# Patient Record
Sex: Male | Born: 1980 | Race: White | Hispanic: No | Marital: Married | Smoking: Current every day smoker
Health system: Southern US, Community
[De-identification: ages and names within clinical notes are randomized; demographics above are authoritative.]

## PROBLEM LIST (undated history)

## (undated) DIAGNOSIS — F329 Major depressive disorder, single episode, unspecified: Secondary | ICD-10-CM

## (undated) DIAGNOSIS — F419 Anxiety disorder, unspecified: Secondary | ICD-10-CM

## (undated) DIAGNOSIS — R51 Headache: Secondary | ICD-10-CM

## (undated) DIAGNOSIS — F4310 Post-traumatic stress disorder, unspecified: Secondary | ICD-10-CM

## (undated) DIAGNOSIS — T7840XA Allergy, unspecified, initial encounter: Secondary | ICD-10-CM

## (undated) DIAGNOSIS — G7101 Duchenne or Becker muscular dystrophy: Secondary | ICD-10-CM

## (undated) DIAGNOSIS — G709 Myoneural disorder, unspecified: Secondary | ICD-10-CM

## (undated) DIAGNOSIS — E785 Hyperlipidemia, unspecified: Secondary | ICD-10-CM

## (undated) DIAGNOSIS — K219 Gastro-esophageal reflux disease without esophagitis: Secondary | ICD-10-CM

## (undated) DIAGNOSIS — E119 Type 2 diabetes mellitus without complications: Secondary | ICD-10-CM

## (undated) DIAGNOSIS — F32A Depression, unspecified: Secondary | ICD-10-CM

## (undated) DIAGNOSIS — G4730 Sleep apnea, unspecified: Secondary | ICD-10-CM

## (undated) DIAGNOSIS — F319 Bipolar disorder, unspecified: Secondary | ICD-10-CM

## (undated) DIAGNOSIS — E669 Obesity, unspecified: Secondary | ICD-10-CM

## (undated) HISTORY — DX: Depression, unspecified: F32.A

## (undated) HISTORY — DX: Bipolar disorder, unspecified: F31.9

## (undated) HISTORY — DX: Headache: R51

## (undated) HISTORY — DX: Major depressive disorder, single episode, unspecified: F32.9

## (undated) HISTORY — DX: Duchenne or Becker muscular dystrophy: G71.01

## (undated) HISTORY — DX: Myoneural disorder, unspecified: G70.9

## (undated) HISTORY — DX: Sleep apnea, unspecified: G47.30

## (undated) HISTORY — DX: Anxiety disorder, unspecified: F41.9

## (undated) HISTORY — DX: Type 2 diabetes mellitus without complications: E11.9

## (undated) HISTORY — DX: Obesity, unspecified: E66.9

## (undated) HISTORY — PX: TONSILLECTOMY: SUR1361

## (undated) HISTORY — DX: Allergy, unspecified, initial encounter: T78.40XA

## (undated) HISTORY — DX: Hyperlipidemia, unspecified: E78.5

---
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
## 2000-01-07 ENCOUNTER — Encounter: Admission: RE | Admit: 2000-01-07 | Discharge: 2000-01-07 | Payer: Self-pay | Admitting: Family Medicine

## 2000-06-03 ENCOUNTER — Encounter: Admission: RE | Admit: 2000-06-03 | Discharge: 2000-06-03 | Payer: Self-pay | Admitting: Family Medicine

## 2000-11-23 ENCOUNTER — Encounter: Admission: RE | Admit: 2000-11-23 | Discharge: 2000-11-23 | Payer: Self-pay | Admitting: Family Medicine

## 2000-12-07 ENCOUNTER — Emergency Department (HOSPITAL_COMMUNITY): Admission: EM | Admit: 2000-12-07 | Discharge: 2000-12-07 | Payer: Self-pay | Admitting: *Deleted

## 2000-12-08 ENCOUNTER — Emergency Department (HOSPITAL_COMMUNITY): Admission: EM | Admit: 2000-12-08 | Discharge: 2000-12-08 | Payer: Self-pay | Admitting: Emergency Medicine

## 2000-12-09 ENCOUNTER — Encounter: Admission: RE | Admit: 2000-12-09 | Discharge: 2000-12-09 | Payer: Self-pay | Admitting: Family Medicine

## 2001-03-26 ENCOUNTER — Encounter: Admission: RE | Admit: 2001-03-26 | Discharge: 2001-03-26 | Payer: Self-pay | Admitting: Family Medicine

## 2001-05-11 ENCOUNTER — Encounter: Admission: RE | Admit: 2001-05-11 | Discharge: 2001-05-11 | Payer: Self-pay | Admitting: Sports Medicine

## 2001-07-28 ENCOUNTER — Encounter: Admission: RE | Admit: 2001-07-28 | Discharge: 2001-07-28 | Payer: Self-pay | Admitting: Family Medicine

## 2001-08-09 ENCOUNTER — Encounter: Admission: RE | Admit: 2001-08-09 | Discharge: 2001-08-09 | Payer: Self-pay | Admitting: Sports Medicine

## 2001-08-09 ENCOUNTER — Encounter: Payer: Self-pay | Admitting: Sports Medicine

## 2001-08-11 ENCOUNTER — Encounter: Admission: RE | Admit: 2001-08-11 | Discharge: 2001-08-11 | Payer: Self-pay | Admitting: Family Medicine

## 2002-04-12 ENCOUNTER — Encounter: Admission: RE | Admit: 2002-04-12 | Discharge: 2002-04-12 | Payer: Self-pay | Admitting: Family Medicine

## 2002-04-15 ENCOUNTER — Encounter: Payer: Self-pay | Admitting: Sports Medicine

## 2002-04-15 ENCOUNTER — Encounter: Admission: RE | Admit: 2002-04-15 | Discharge: 2002-04-15 | Payer: Self-pay | Admitting: Sports Medicine

## 2002-05-03 ENCOUNTER — Encounter: Admission: RE | Admit: 2002-05-03 | Discharge: 2002-05-03 | Payer: Self-pay | Admitting: Family Medicine

## 2002-06-01 ENCOUNTER — Encounter: Admission: RE | Admit: 2002-06-01 | Discharge: 2002-06-01 | Payer: Self-pay | Admitting: Family Medicine

## 2002-08-01 ENCOUNTER — Encounter: Admission: RE | Admit: 2002-08-01 | Discharge: 2002-08-01 | Payer: Self-pay | Admitting: Family Medicine

## 2002-10-17 ENCOUNTER — Encounter: Payer: Self-pay | Admitting: Emergency Medicine

## 2002-10-17 ENCOUNTER — Emergency Department (HOSPITAL_COMMUNITY): Admission: EM | Admit: 2002-10-17 | Discharge: 2002-10-17 | Payer: Self-pay | Admitting: Emergency Medicine

## 2003-01-20 ENCOUNTER — Encounter: Admission: RE | Admit: 2003-01-20 | Discharge: 2003-01-20 | Payer: Self-pay | Admitting: Family Medicine

## 2003-04-13 ENCOUNTER — Encounter: Admission: RE | Admit: 2003-04-13 | Discharge: 2003-04-13 | Payer: Self-pay | Admitting: Family Medicine

## 2003-04-18 ENCOUNTER — Encounter: Admission: RE | Admit: 2003-04-18 | Discharge: 2003-04-18 | Payer: Self-pay | Admitting: Family Medicine

## 2003-08-16 ENCOUNTER — Encounter: Admission: RE | Admit: 2003-08-16 | Discharge: 2003-08-16 | Payer: Self-pay | Admitting: Family Medicine

## 2003-08-18 ENCOUNTER — Encounter: Admission: RE | Admit: 2003-08-18 | Discharge: 2003-08-18 | Payer: Self-pay | Admitting: Family Medicine

## 2003-08-21 ENCOUNTER — Encounter: Admission: RE | Admit: 2003-08-21 | Discharge: 2003-08-21 | Payer: Self-pay | Admitting: Sports Medicine

## 2003-09-04 ENCOUNTER — Encounter: Admission: RE | Admit: 2003-09-04 | Discharge: 2003-10-05 | Payer: Self-pay | Admitting: Sports Medicine

## 2004-02-02 ENCOUNTER — Encounter: Admission: RE | Admit: 2004-02-02 | Discharge: 2004-02-02 | Payer: Self-pay | Admitting: Sports Medicine

## 2004-04-05 ENCOUNTER — Encounter: Admission: RE | Admit: 2004-04-05 | Discharge: 2004-04-05 | Payer: Self-pay | Admitting: Family Medicine

## 2004-06-07 ENCOUNTER — Encounter: Admission: RE | Admit: 2004-06-07 | Discharge: 2004-06-07 | Payer: Self-pay | Admitting: Family Medicine

## 2004-07-05 HISTORY — PX: TRANSTHORACIC ECHOCARDIOGRAM: SHX275

## 2004-07-08 ENCOUNTER — Ambulatory Visit: Payer: Self-pay | Admitting: Family Medicine

## 2004-08-09 ENCOUNTER — Ambulatory Visit: Payer: Self-pay | Admitting: Sports Medicine

## 2004-08-28 ENCOUNTER — Ambulatory Visit: Payer: Self-pay | Admitting: Family Medicine

## 2005-03-06 ENCOUNTER — Ambulatory Visit: Payer: Self-pay | Admitting: Family Medicine

## 2005-03-07 ENCOUNTER — Ambulatory Visit: Payer: Self-pay | Admitting: Family Medicine

## 2005-03-12 ENCOUNTER — Ambulatory Visit: Payer: Self-pay | Admitting: Family Medicine

## 2005-04-02 ENCOUNTER — Ambulatory Visit (HOSPITAL_COMMUNITY): Admission: RE | Admit: 2005-04-02 | Discharge: 2005-04-02 | Payer: Self-pay | Admitting: Sports Medicine

## 2005-04-06 ENCOUNTER — Emergency Department (HOSPITAL_COMMUNITY): Admission: EM | Admit: 2005-04-06 | Discharge: 2005-04-06 | Payer: Self-pay | Admitting: Emergency Medicine

## 2005-04-11 ENCOUNTER — Ambulatory Visit: Payer: Self-pay | Admitting: Sports Medicine

## 2005-04-28 ENCOUNTER — Ambulatory Visit: Payer: Self-pay | Admitting: Sports Medicine

## 2005-05-20 ENCOUNTER — Ambulatory Visit: Payer: Self-pay | Admitting: Family Medicine

## 2005-08-08 ENCOUNTER — Ambulatory Visit: Payer: Self-pay | Admitting: Family Medicine

## 2005-08-13 ENCOUNTER — Ambulatory Visit: Payer: Self-pay | Admitting: Family Medicine

## 2005-10-15 ENCOUNTER — Ambulatory Visit: Payer: Self-pay | Admitting: Family Medicine

## 2005-10-22 ENCOUNTER — Ambulatory Visit: Payer: Self-pay | Admitting: Family Medicine

## 2005-12-19 ENCOUNTER — Emergency Department (HOSPITAL_COMMUNITY): Admission: EM | Admit: 2005-12-19 | Discharge: 2005-12-20 | Payer: Self-pay | Admitting: Emergency Medicine

## 2005-12-23 ENCOUNTER — Ambulatory Visit: Payer: Self-pay | Admitting: Family Medicine

## 2006-01-08 ENCOUNTER — Emergency Department (HOSPITAL_COMMUNITY): Admission: EM | Admit: 2006-01-08 | Discharge: 2006-01-09 | Payer: Self-pay | Admitting: Emergency Medicine

## 2006-04-24 ENCOUNTER — Ambulatory Visit: Payer: Self-pay | Admitting: Family Medicine

## 2006-07-21 ENCOUNTER — Ambulatory Visit: Payer: Self-pay | Admitting: Sports Medicine

## 2006-08-11 ENCOUNTER — Ambulatory Visit: Payer: Self-pay | Admitting: Family Medicine

## 2006-12-25 ENCOUNTER — Telehealth: Payer: Self-pay | Admitting: *Deleted

## 2006-12-25 ENCOUNTER — Ambulatory Visit: Payer: Self-pay | Admitting: Sports Medicine

## 2006-12-25 DIAGNOSIS — F17200 Nicotine dependence, unspecified, uncomplicated: Secondary | ICD-10-CM

## 2007-02-25 ENCOUNTER — Encounter (INDEPENDENT_AMBULATORY_CARE_PROVIDER_SITE_OTHER): Payer: Self-pay | Admitting: Family Medicine

## 2007-02-26 ENCOUNTER — Ambulatory Visit: Payer: Self-pay | Admitting: Sports Medicine

## 2007-04-26 ENCOUNTER — Ambulatory Visit: Payer: Self-pay | Admitting: Family Medicine

## 2007-04-26 ENCOUNTER — Emergency Department (HOSPITAL_COMMUNITY): Admission: EM | Admit: 2007-04-26 | Discharge: 2007-04-26 | Payer: Self-pay | Admitting: Family Medicine

## 2007-04-26 ENCOUNTER — Telehealth (INDEPENDENT_AMBULATORY_CARE_PROVIDER_SITE_OTHER): Payer: Self-pay | Admitting: *Deleted

## 2007-04-26 ENCOUNTER — Telehealth: Payer: Self-pay | Admitting: *Deleted

## 2007-04-26 DIAGNOSIS — F1010 Alcohol abuse, uncomplicated: Secondary | ICD-10-CM | POA: Insufficient documentation

## 2007-05-18 ENCOUNTER — Ambulatory Visit: Payer: Self-pay | Admitting: Family Medicine

## 2007-06-08 ENCOUNTER — Ambulatory Visit: Payer: Self-pay | Admitting: Family Medicine

## 2007-06-08 ENCOUNTER — Encounter (INDEPENDENT_AMBULATORY_CARE_PROVIDER_SITE_OTHER): Payer: Self-pay | Admitting: Family Medicine

## 2007-06-10 LAB — CONVERTED CEMR LAB

## 2007-07-01 ENCOUNTER — Ambulatory Visit: Payer: Self-pay | Admitting: Family Medicine

## 2007-07-01 ENCOUNTER — Telehealth (INDEPENDENT_AMBULATORY_CARE_PROVIDER_SITE_OTHER): Payer: Self-pay | Admitting: *Deleted

## 2007-07-19 ENCOUNTER — Emergency Department (HOSPITAL_COMMUNITY): Admission: EM | Admit: 2007-07-19 | Discharge: 2007-07-20 | Payer: Self-pay | Admitting: Emergency Medicine

## 2007-07-20 ENCOUNTER — Inpatient Hospital Stay (HOSPITAL_COMMUNITY): Admission: EM | Admit: 2007-07-20 | Discharge: 2007-07-23 | Payer: Self-pay | Admitting: Psychiatry

## 2007-07-20 ENCOUNTER — Ambulatory Visit: Payer: Self-pay | Admitting: *Deleted

## 2007-08-02 ENCOUNTER — Encounter (INDEPENDENT_AMBULATORY_CARE_PROVIDER_SITE_OTHER): Payer: Self-pay | Admitting: Family Medicine

## 2007-08-02 LAB — CONVERTED CEMR LAB
AST: 29 units/L
Alkaline Phosphatase: 67 units/L
Bilirubin, Direct: 0 mg/dL
Total Bilirubin: 0.4 mg/dL
Total Protein: 6.9 g/dL

## 2007-08-04 ENCOUNTER — Ambulatory Visit: Payer: Self-pay | Admitting: Family Medicine

## 2007-10-20 ENCOUNTER — Encounter (INDEPENDENT_AMBULATORY_CARE_PROVIDER_SITE_OTHER): Payer: Self-pay | Admitting: Family Medicine

## 2007-10-20 ENCOUNTER — Ambulatory Visit: Payer: Self-pay | Admitting: Family Medicine

## 2007-10-20 LAB — CONVERTED CEMR LAB
Bilirubin Urine: NEGATIVE
Blood in Urine, dipstick: NEGATIVE
Ketones, urine, test strip: NEGATIVE
Specific Gravity, Urine: 1.025

## 2007-11-03 ENCOUNTER — Encounter (INDEPENDENT_AMBULATORY_CARE_PROVIDER_SITE_OTHER): Payer: Self-pay | Admitting: Family Medicine

## 2007-11-03 LAB — CONVERTED CEMR LAB
AST: 34 units/L
Albumin: 4.2 g/dL
HCT: 43.9 %
Hemoglobin: 15.5 g/dL
MCV: 87 fL
Platelets: 154 10*3/uL
Total Protein: 6.9 g/dL

## 2007-11-05 ENCOUNTER — Ambulatory Visit: Payer: Self-pay | Admitting: Family Medicine

## 2007-11-25 ENCOUNTER — Ambulatory Visit: Payer: Self-pay | Admitting: Family Medicine

## 2007-11-25 ENCOUNTER — Encounter (INDEPENDENT_AMBULATORY_CARE_PROVIDER_SITE_OTHER): Payer: Self-pay | Admitting: Family Medicine

## 2007-11-25 LAB — CONVERTED CEMR LAB
BUN: 15 mg/dL (ref 6–23)
CO2: 25 meq/L (ref 19–32)
Creatinine, Ser: 0.98 mg/dL (ref 0.40–1.50)
Glucose, Bld: 100 mg/dL — ABNORMAL HIGH (ref 70–99)
Total Bilirubin: 0.6 mg/dL (ref 0.3–1.2)

## 2007-12-02 ENCOUNTER — Telehealth (INDEPENDENT_AMBULATORY_CARE_PROVIDER_SITE_OTHER): Payer: Self-pay | Admitting: Family Medicine

## 2007-12-10 ENCOUNTER — Encounter: Payer: Self-pay | Admitting: *Deleted

## 2008-01-31 ENCOUNTER — Encounter: Payer: Self-pay | Admitting: *Deleted

## 2008-02-24 ENCOUNTER — Encounter: Payer: Self-pay | Admitting: *Deleted

## 2008-03-03 ENCOUNTER — Ambulatory Visit: Payer: Self-pay | Admitting: Family Medicine

## 2008-03-03 ENCOUNTER — Encounter (INDEPENDENT_AMBULATORY_CARE_PROVIDER_SITE_OTHER): Payer: Self-pay | Admitting: Family Medicine

## 2008-03-03 LAB — CONVERTED CEMR LAB
ALT: 55 units/L — ABNORMAL HIGH (ref 0–53)
AST: 54 units/L — ABNORMAL HIGH (ref 0–37)
Bilirubin, Direct: 0.1 mg/dL (ref 0.0–0.3)
CO2: 21 meq/L (ref 19–32)
Chloride: 104 meq/L (ref 96–112)
Cholesterol: 240 mg/dL — ABNORMAL HIGH (ref 0–200)
Glucose, Bld: 101 mg/dL — ABNORMAL HIGH (ref 70–99)
Hep B C IgM: NEGATIVE
Hepatitis B Surface Ag: NEGATIVE
Potassium: 4.1 meq/L (ref 3.5–5.3)
Sodium: 140 meq/L (ref 135–145)
TSH: 1.124 microintl units/mL (ref 0.350–5.50)
Total CHOL/HDL Ratio: 7.7
VLDL: 31 mg/dL (ref 0–40)

## 2008-03-10 ENCOUNTER — Telehealth (INDEPENDENT_AMBULATORY_CARE_PROVIDER_SITE_OTHER): Payer: Self-pay | Admitting: Family Medicine

## 2008-03-24 ENCOUNTER — Encounter (INDEPENDENT_AMBULATORY_CARE_PROVIDER_SITE_OTHER): Payer: Self-pay | Admitting: Family Medicine

## 2008-03-24 ENCOUNTER — Ambulatory Visit: Payer: Self-pay | Admitting: Family Medicine

## 2008-03-24 LAB — CONVERTED CEMR LAB
HCT: 47.9 % (ref 39.0–52.0)
Platelets: 165 10*3/uL (ref 150–400)
RDW: 13.6 % (ref 11.5–15.5)

## 2008-03-27 ENCOUNTER — Encounter (INDEPENDENT_AMBULATORY_CARE_PROVIDER_SITE_OTHER): Payer: Self-pay | Admitting: Family Medicine

## 2008-03-27 ENCOUNTER — Encounter: Admission: RE | Admit: 2008-03-27 | Discharge: 2008-03-27 | Payer: Self-pay | Admitting: Family Medicine

## 2008-03-28 ENCOUNTER — Encounter (INDEPENDENT_AMBULATORY_CARE_PROVIDER_SITE_OTHER): Payer: Self-pay | Admitting: Family Medicine

## 2008-03-30 ENCOUNTER — Ambulatory Visit: Payer: Self-pay | Admitting: Family Medicine

## 2008-03-30 DIAGNOSIS — E785 Hyperlipidemia, unspecified: Secondary | ICD-10-CM

## 2008-03-30 DIAGNOSIS — E7800 Pure hypercholesterolemia, unspecified: Secondary | ICD-10-CM | POA: Insufficient documentation

## 2008-04-11 ENCOUNTER — Telehealth (INDEPENDENT_AMBULATORY_CARE_PROVIDER_SITE_OTHER): Payer: Self-pay | Admitting: Family Medicine

## 2008-04-26 ENCOUNTER — Encounter (INDEPENDENT_AMBULATORY_CARE_PROVIDER_SITE_OTHER): Payer: Self-pay | Admitting: Family Medicine

## 2008-05-05 ENCOUNTER — Ambulatory Visit: Payer: Self-pay | Admitting: Family Medicine

## 2008-05-05 ENCOUNTER — Encounter (INDEPENDENT_AMBULATORY_CARE_PROVIDER_SITE_OTHER): Payer: Self-pay | Admitting: Family Medicine

## 2008-05-05 DIAGNOSIS — F319 Bipolar disorder, unspecified: Secondary | ICD-10-CM | POA: Insufficient documentation

## 2008-05-05 LAB — CONVERTED CEMR LAB
AST: 26 units/L (ref 0–37)
Alkaline Phosphatase: 71 units/L (ref 39–117)
BUN: 7 mg/dL (ref 6–23)
Creatinine, Ser: 0.99 mg/dL (ref 0.40–1.50)

## 2008-05-08 ENCOUNTER — Telehealth (INDEPENDENT_AMBULATORY_CARE_PROVIDER_SITE_OTHER): Payer: Self-pay | Admitting: *Deleted

## 2008-05-08 ENCOUNTER — Encounter (INDEPENDENT_AMBULATORY_CARE_PROVIDER_SITE_OTHER): Payer: Self-pay | Admitting: Family Medicine

## 2008-05-10 ENCOUNTER — Encounter (INDEPENDENT_AMBULATORY_CARE_PROVIDER_SITE_OTHER): Payer: Self-pay | Admitting: Family Medicine

## 2008-07-10 ENCOUNTER — Ambulatory Visit: Payer: Self-pay | Admitting: Family Medicine

## 2008-08-18 ENCOUNTER — Encounter (INDEPENDENT_AMBULATORY_CARE_PROVIDER_SITE_OTHER): Payer: Self-pay | Admitting: Family Medicine

## 2008-08-18 ENCOUNTER — Ambulatory Visit: Payer: Self-pay | Admitting: Family Medicine

## 2008-08-18 LAB — CONVERTED CEMR LAB
ALT: 41 units/L (ref 0–53)
Albumin: 4.4 g/dL (ref 3.5–5.2)
BUN: 9 mg/dL (ref 6–23)
CO2: 24 meq/L (ref 19–32)
Calcium: 9 mg/dL (ref 8.4–10.5)
Chloride: 105 meq/L (ref 96–112)
Creatinine, Ser: 1.01 mg/dL (ref 0.40–1.50)
Potassium: 3.8 meq/L (ref 3.5–5.3)

## 2008-08-21 ENCOUNTER — Encounter (INDEPENDENT_AMBULATORY_CARE_PROVIDER_SITE_OTHER): Payer: Self-pay | Admitting: Family Medicine

## 2008-10-04 ENCOUNTER — Ambulatory Visit: Payer: Self-pay | Admitting: Family Medicine

## 2008-10-04 ENCOUNTER — Encounter: Payer: Self-pay | Admitting: Family Medicine

## 2008-10-04 ENCOUNTER — Telehealth (INDEPENDENT_AMBULATORY_CARE_PROVIDER_SITE_OTHER): Payer: Self-pay | Admitting: Family Medicine

## 2008-12-07 ENCOUNTER — Ambulatory Visit: Payer: Self-pay | Admitting: Family Medicine

## 2009-01-24 ENCOUNTER — Ambulatory Visit: Payer: Self-pay | Admitting: Family Medicine

## 2009-01-24 ENCOUNTER — Encounter (INDEPENDENT_AMBULATORY_CARE_PROVIDER_SITE_OTHER): Payer: Self-pay | Admitting: Family Medicine

## 2009-01-24 DIAGNOSIS — G4730 Sleep apnea, unspecified: Secondary | ICD-10-CM | POA: Insufficient documentation

## 2009-01-24 LAB — CONVERTED CEMR LAB
ALT: 45 units/L (ref 0–53)
AST: 36 units/L (ref 0–37)
CO2: 25 meq/L (ref 19–32)
Chloride: 104 meq/L (ref 96–112)
Sodium: 143 meq/L (ref 135–145)
Total Bilirubin: 0.4 mg/dL (ref 0.3–1.2)
Total Protein: 7.5 g/dL (ref 6.0–8.3)

## 2009-01-26 ENCOUNTER — Encounter (INDEPENDENT_AMBULATORY_CARE_PROVIDER_SITE_OTHER): Payer: Self-pay | Admitting: Family Medicine

## 2009-03-07 ENCOUNTER — Ambulatory Visit (HOSPITAL_BASED_OUTPATIENT_CLINIC_OR_DEPARTMENT_OTHER): Admission: RE | Admit: 2009-03-07 | Discharge: 2009-03-07 | Payer: Self-pay | Admitting: Family Medicine

## 2009-03-10 ENCOUNTER — Ambulatory Visit: Payer: Self-pay | Admitting: Internal Medicine

## 2009-03-14 ENCOUNTER — Ambulatory Visit: Payer: Self-pay | Admitting: Family Medicine

## 2009-04-02 ENCOUNTER — Encounter (INDEPENDENT_AMBULATORY_CARE_PROVIDER_SITE_OTHER): Payer: Self-pay | Admitting: Family Medicine

## 2009-04-04 ENCOUNTER — Ambulatory Visit: Payer: Self-pay | Admitting: Family Medicine

## 2009-05-10 ENCOUNTER — Ambulatory Visit: Payer: Self-pay | Admitting: Family Medicine

## 2009-06-14 ENCOUNTER — Ambulatory Visit: Payer: Self-pay | Admitting: Family Medicine

## 2009-06-15 ENCOUNTER — Emergency Department (HOSPITAL_COMMUNITY): Admission: EM | Admit: 2009-06-15 | Discharge: 2009-06-15 | Payer: Self-pay | Admitting: Family Medicine

## 2009-06-25 ENCOUNTER — Emergency Department (HOSPITAL_COMMUNITY): Admission: EM | Admit: 2009-06-25 | Discharge: 2009-06-26 | Payer: Self-pay | Admitting: Emergency Medicine

## 2009-07-01 ENCOUNTER — Telehealth: Payer: Self-pay | Admitting: Family Medicine

## 2009-07-02 ENCOUNTER — Ambulatory Visit: Payer: Self-pay | Admitting: Family Medicine

## 2009-07-02 ENCOUNTER — Encounter: Payer: Self-pay | Admitting: *Deleted

## 2009-07-04 ENCOUNTER — Ambulatory Visit: Payer: Self-pay | Admitting: Family Medicine

## 2009-07-04 DIAGNOSIS — G7109 Other specified muscular dystrophies: Secondary | ICD-10-CM | POA: Insufficient documentation

## 2009-07-06 ENCOUNTER — Telehealth (INDEPENDENT_AMBULATORY_CARE_PROVIDER_SITE_OTHER): Payer: Self-pay | Admitting: *Deleted

## 2009-07-25 ENCOUNTER — Ambulatory Visit: Payer: Self-pay | Admitting: Family Medicine

## 2009-07-25 ENCOUNTER — Encounter: Payer: Self-pay | Admitting: Family Medicine

## 2009-07-25 LAB — CONVERTED CEMR LAB
ALT: 43 units/L (ref 0–53)
AST: 25 units/L (ref 0–37)
Alkaline Phosphatase: 84 units/L (ref 39–117)
CO2: 24 meq/L (ref 19–32)
Creatinine, Ser: 0.96 mg/dL (ref 0.40–1.50)
HCT: 46 % (ref 39.0–52.0)
MCV: 87.6 fL (ref 78.0–100.0)
Platelets: 181 10*3/uL (ref 150–400)
RDW: 13.6 % (ref 11.5–15.5)
Sodium: 140 meq/L (ref 135–145)
Total Bilirubin: 0.4 mg/dL (ref 0.3–1.2)
Total Protein: 7.3 g/dL (ref 6.0–8.3)
WBC: 7.5 10*3/uL (ref 4.0–10.5)

## 2009-07-26 ENCOUNTER — Encounter: Payer: Self-pay | Admitting: Family Medicine

## 2009-08-21 ENCOUNTER — Encounter: Payer: Self-pay | Admitting: Family Medicine

## 2009-08-27 ENCOUNTER — Encounter: Admission: RE | Admit: 2009-08-27 | Discharge: 2009-08-27 | Payer: Self-pay | Admitting: Neurology

## 2009-10-23 ENCOUNTER — Telehealth: Payer: Self-pay | Admitting: Family Medicine

## 2009-10-24 ENCOUNTER — Ambulatory Visit: Payer: Self-pay | Admitting: Family Medicine

## 2010-03-03 ENCOUNTER — Emergency Department (HOSPITAL_COMMUNITY): Admission: EM | Admit: 2010-03-03 | Discharge: 2010-03-03 | Payer: Self-pay | Admitting: Family Medicine

## 2010-03-22 ENCOUNTER — Ambulatory Visit: Payer: Self-pay | Admitting: Family Medicine

## 2010-04-24 ENCOUNTER — Encounter: Payer: Self-pay | Admitting: Family Medicine

## 2010-04-24 ENCOUNTER — Telehealth: Payer: Self-pay | Admitting: Family Medicine

## 2010-05-21 ENCOUNTER — Ambulatory Visit: Payer: Self-pay | Admitting: Family Medicine

## 2010-05-21 ENCOUNTER — Encounter: Payer: Self-pay | Admitting: Family Medicine

## 2010-05-21 ENCOUNTER — Ambulatory Visit (HOSPITAL_COMMUNITY): Admission: RE | Admit: 2010-05-21 | Discharge: 2010-05-21 | Payer: Self-pay | Admitting: Family Medicine

## 2010-07-12 ENCOUNTER — Emergency Department (HOSPITAL_COMMUNITY): Admission: EM | Admit: 2010-07-12 | Discharge: 2010-07-12 | Payer: Self-pay | Admitting: Family Medicine

## 2010-07-18 ENCOUNTER — Telehealth: Payer: Self-pay | Admitting: Family Medicine

## 2010-07-19 ENCOUNTER — Telehealth: Payer: Self-pay | Admitting: Family Medicine

## 2010-08-01 ENCOUNTER — Ambulatory Visit: Payer: Self-pay | Admitting: Family Medicine

## 2010-08-02 ENCOUNTER — Emergency Department (HOSPITAL_COMMUNITY): Admission: EM | Admit: 2010-08-02 | Discharge: 2010-08-02 | Payer: Self-pay | Admitting: Emergency Medicine

## 2010-10-08 ENCOUNTER — Encounter: Payer: Self-pay | Admitting: Family Medicine

## 2010-10-17 ENCOUNTER — Ambulatory Visit: Payer: Self-pay | Admitting: Family Medicine

## 2010-10-17 ENCOUNTER — Encounter: Payer: Self-pay | Admitting: Family Medicine

## 2010-10-17 DIAGNOSIS — E669 Obesity, unspecified: Secondary | ICD-10-CM

## 2010-10-17 LAB — CONVERTED CEMR LAB
Alkaline Phosphatase: 66 units/L (ref 39–117)
BUN: 9 mg/dL (ref 6–23)
CO2: 26 meq/L (ref 19–32)
Cholesterol: 125 mg/dL (ref 0–200)
Creatinine, Ser: 0.89 mg/dL (ref 0.40–1.50)
Glucose, Bld: 99 mg/dL (ref 70–99)
HDL: 22 mg/dL — ABNORMAL LOW (ref >39)
LDL Cholesterol: 72 mg/dL (ref 0–99)
Sodium: 140 meq/L (ref 135–145)
Total Bilirubin: 0.4 mg/dL (ref 0.3–1.2)
Total CHOL/HDL Ratio: 5.7
Triglycerides: 155 mg/dL — ABNORMAL HIGH (ref <150)
VLDL: 31 mg/dL (ref 0–40)

## 2010-10-23 ENCOUNTER — Encounter: Payer: Self-pay | Admitting: Family Medicine

## 2010-11-19 NOTE — Letter (Signed)
Summary: Out of Work  [facility]  [address]   [place], [place] [address]   Phone: [phone]  Fax: [phone]    May 21, 2010   Employee:  DALVIN CLIPPER    To Whom It May Concern:   For Medical reasons, please excuse the above named employee from work for the following dates:  Start:   August 2nd, 2011  End:   August 3rd, 2011  If you need additional information, please feel free to contact our office.         Sincerely,    Dalbert Mayotte MD

## 2010-11-19 NOTE — Progress Notes (Signed)
   Phone Note Call from Patient   Caller: Patient Details for Reason: Jury Duty Summary of Call: Needs excuse from [place] duty, secondary to inattentivness with Bipolar and inability to concentrate, also can not miss work

## 2010-11-19 NOTE — Assessment & Plan Note (Signed)
Summary: red itchy rash/Banks/Garfield's   Vital Signs:  Patient profile:   30 year old male Weight:      269.8 pounds Temp:     97.8 degrees F oral Pulse rate:   111 / minute Pulse rhythm:   o BP sitting:   105 / 73  (right arm) Cuff size:   large  Vitals Entered By: Audelia Hives CMA (October 24, 2009 9:11 AM) CC: pt states he woke up one morning itching and burning started on hand and spread   Primary Care Provider:  Vic Blackbird MD  CC:  pt states he woke up one morning itching and burning started on hand and spread.  History of Present Illness: Mr. [name] comes in today for ichy rash.  He was seen in October for petechial rash that was no particularly itchy at that time.  Unclear etiology and he was told to monitor.  Rash has persisted but over last week has had more pop up and now they are ithcy.  Rash is on feet, legs, forearms, and back but primarily forearms and back are what is itchy.  NO new medications, soaps, foods, etc. Has not tried anything at home.  Itching keeping him up at night.  Allergies: No Known Drug Allergies  Physical Exam  General:  obese, well-appearing, NAD vitals reviewed Skin:  scattered pin point petechiae on bilateral feet.  A few scattered on abdomen.  Back and legs with petechiae as well.  non blanching.  flat.  Cervical Nodes:  No lymphadenopathy noted   Impression & Recommendations:  Problem # 1:  PETECHIAE (ICD-782.7) Assessment Unchanged  Precepted with Dr. Erin Hearing.  Appears consistent with Schamburg's Rash, also known as Progressive Pigmented Purpura.  Nothign specifici today.  Idiopathic cause.  Can come and go, can coalesce.  Sometimes can itch.  Treat with triamcinolone cream and hydroxyzine.    Orders: [facility]- Est Level  3 ([address]11)  Complete Medication List: 1)  Prilosec 40 Mg Cpdr (Omeprazole) .Marland Kitchen.. 1 by mouth two times a day 2)  Simvastatin 80 Mg Tabs (Simvastatin) .Marland Kitchen.. 1 tablet a day for cholesterol take at bedtime. 3)  Aspirin 81  Mg Tbec (Aspirin) .... One by mouth every day 4)  Seroquel Xr 300 Mg Xr24h-tab (Quetiapine fumarate) .... 2 by mouth at bedtime 5)  Flexeril 10 Mg Tabs (Cyclobenzaprine hcl) .Marland Kitchen.. 1 by mouth every 8 hours as needed for muscle spasm x 10 days 6)  Ibuprofen 600 Mg Tabs (Ibuprofen) .Marland Kitchen.. 1 by mouth q6hrs as needed pain 7)  Amoxicillin 500 Mg Caps (Amoxicillin) .Marland Kitchen.. 1 by mouth two times a day x 5 days 8)  Vicodin 5-500 Mg Tabs (Hydrocodone-acetaminophen) .Marland Kitchen.. 1 by mouth q 4hrs as needed pain 9)  Triamcinolone Acetonide 0.1 % Crea (Triamcinolone acetonide) .... Apply to affected areas 1-2 times daily for itching.  do not apply to face.  disp: 80g 10)  Hydroxyzine Hcl 50 Mg Tabs (Hydroxyzine hcl) .Marland Kitchen.. 1 tab by mouth q 6 hrs as needed itching.  Patient Instructions: 1)  Your rash appears to be Chamburg's rash or Progressive Pigmented Purpura.  Despite the big names, it's nothign dangerous and we don't know exactly what causes it.  It can come and go and sometimes it can be itchy.  There's nothing to do that will make it go away but we can help the itching with steroid creams and pills for nighttime.  Prescriptions: HYDROXYZINE HCL 50 MG TABS (HYDROXYZINE HCL) 1 tab by mouth q 6 hrs as  needed itching.  #30 x 2   Entered and Authorized by:   Orland Mustard  MD   Signed by:   Orland Mustard  MD on 10/24/2009   Method used:   Print then Give to Patient   RxID:   [id number] TRIAMCINOLONE ACETONIDE 0.1 % CREA (TRIAMCINOLONE ACETONIDE) apply to affected areas 1-2 times daily for itching.  Do not apply to face.  disp: 80g  #1 x 3   Entered and Authorized by:   Orland Mustard  MD   Signed by:   Orland Mustard  MD on 10/24/2009   Method used:   Print then Give to Patient   RxID:   [id number]

## 2010-11-19 NOTE — Assessment & Plan Note (Signed)
Summary: flu shot,df   Nurse Visit   Vitals Entered By: Mauricia Area CMA, (August 01, 2010 3:09 PM)  Allergies: No Known Drug Allergies  Immunizations Administered:  Influenza Vaccine # 1:    Vaccine Type: Fluvax MCR    Site: right deltoid    Mfr: GlaxoSmithKline    Dose: 0.5 ml    Route: IM    Given by: Mauricia Area CMA,    Exp. Date: 05/16/2011    Lot #: [id number]    VIS given: 05/14/10 version given August 01, 2010.  Flu Vaccine Consent Questions:    Do you have a history of severe allergic reactions to this vaccine? no    Any prior history of allergic reactions to egg and/or gelatin? no    Do you have a sensitivity to the preservative Thimersol? no    Do you have a past history of Guillan-Barre Syndrome? no    Do you currently have an acute febrile illness? no    Have you ever had a severe reaction to latex? no    Vaccine information given and explained to patient? yes  Orders Added: 1)  Influenza Vaccine MCR [00025] 2)  Administration Flu vaccine - MCR [P1031]

## 2010-11-19 NOTE — Letter (Signed)
Summary: Generic Letter  [facility]  [address]   [place], [place] [address]   Phone: [phone]  Fax: [phone]    04/24/2010  JAMARIAN JACINTO [place],   [address]      To Whom this May Concern:   Mr. Blanda is under the care of [facility], he has given permission for release of information regarding his medical problems and inability to serve as a juror.  Mr. Ronan has been diagnosed with Bipolar currently on treatment, mild mental retardation and symptoms of Adult ADD. Please excuse Mr. Bebout based on these medical conditions which would effect his ability to focus and comprehend information presented in a timely matter.  Questions please feel free to call.        Sincerely,   Vic Blackbird MD

## 2010-11-19 NOTE — Progress Notes (Signed)
Summary: Rx Req   Phone Note Refill Request Call back at [phone] Message from:  Patient  Refills Requested: Medication #1:  PRILOSEC 40 MG  CPDR 1 by mouth two times a day [facility]  Initial call taken by: Raymond Gurney,  July 19, 2010 4:03 PM    Prescriptions: PRILOSEC 40 MG  CPDR (OMEPRAZOLE) 1 by mouth two times a day  #60 Capsule x 11   Entered and Authorized by:   Vic Blackbird MD   Signed by:   Vic Blackbird MD on 07/19/2010   Method used:   Electronically to        [facility] [address] [phone]* (retail)       [address]       [place], [place]  [address]       Ph: [phone] or [phone]       Fax: [phone]   RxID:   [id number]

## 2010-11-19 NOTE — Letter (Signed)
Summary: Out of Work  [facility]  [address]   [place], [place] [address]   Phone: [phone]  Fax: [phone]    May 21, 2010   Employee:  [name]    To Whom It May Concern:   For Medical reasons, please excuse the above named employee from work for the following dates:  Start:    End:    If you need additional information, please feel free to contact our office.         Sincerely,    Dalbert Mayotte MD

## 2010-11-19 NOTE — Progress Notes (Signed)
Summary: triage   Phone Note Call from Patient Call back at Home Phone [phone]   Caller: Patient Summary of Call: has a rash everywhere and wants to come in. Initial call taken by: Audie Clear,  October 23, 2009 11:45 AM  Follow-up for Phone Call        red, itchy rash on arm & back & legs. started yesterday am. using lotion. will be here at 8:30am tomorrow. knows pcp will not be seeing him Follow-up by: Elige Radon RN,  October 23, 2009 11:56 AM

## 2010-11-19 NOTE — Progress Notes (Signed)
   Phone Note Refill Request Call back at [phone]   Refills Requested: Medication #1:  SIMVASTATIN 80 MG  TABS 1 tablet a day for cholesterol take at bedtime.  Medication #2:  SEROQUEL XR 300 MG XR24H-TAB 1 by mouth at bedtime Patient [facility]  Initial call taken by: Eusebio Friendly,  July 18, 2010 3:29 PM    Prescriptions: SEROQUEL XR 300 MG XR24H-TAB (QUETIAPINE FUMARATE) 1 by mouth at bedtime  #30 x 3   Entered and Authorized by:   Vic Blackbird MD   Signed by:   Vic Blackbird MD on 07/18/2010   Method used:   Electronically to        [facility] [address] [phone]* (retail)       [address]       [place], [place]  [address]       Ph: [phone] or [phone]       Fax: [phone]   RxID:   [id number] SIMVASTATIN 80 MG  TABS (SIMVASTATIN) 1 tablet a day for cholesterol take at bedtime.  #90 x 4   Entered and Authorized by:   Vic Blackbird MD   Signed by:   Vic Blackbird MD on 07/18/2010   Method used:   Electronically to        [facility] [address] [phone]* (retail)       [address]       [place], [place]  [address]       Ph: [phone] or [phone]       Fax: [phone]   RxID:   [id number]

## 2010-11-19 NOTE — Assessment & Plan Note (Signed)
Summary: chest pain xs 1 week,tcb   Vital Signs:  Patient profile:   30 year old male Height:      67.5 inches Weight:      261 pounds BMI:     40.42 Temp:     97.6 degrees F oral Pulse rate:   84 / minute BP sitting:   103 / 67  (left arm) Cuff size:   large  Vitals Entered By: Schuyler Amor CMA (May 21, 2010 10:08 AM) CC: chest pain x 2 weeks Is Patient Diabetic? No Pain Assessment Patient in pain? yes     Location: left shoulder Intensity: 5   Primary Care Provider:  Vic Blackbird MD  CC:  chest pain x 2 weeks.  History of Present Illness: Patient of Dr. 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  Was intensified with deep breathing. No cough or fever, no nausea or emesis.  No abd pain.  Was present most of the day at a 7/10, then quieted down when he took his Seroquel at night.  This morning has had the same pain lightly (more on R side of chest wall, rates a 3/10).  No prior episode of chest pain.  Has had reflux before.   Father with CAD in his late 42s.  Social Hx: Smokes 2ppd now; quit drugs for his 66 yr old son.  Lives with his son's mother and his own mother, both of whom smoke.  He is not interested in quitting.   Habits & Providers  Alcohol-Tobacco-Diet     Tobacco Status: current     Tobacco Counseling: to quit use of tobacco products     Cigarette Packs/Day: 2.0  Current Medications (verified): 1)  Prilosec 40 Mg  Cpdr (Omeprazole) .Marland Kitchen.. 1 By Mouth Two Times A Day 2)  Simvastatin 80 Mg  Tabs (Simvastatin) .Marland Kitchen.. 1 Tablet A Day For Cholesterol Take At Bedtime. 3)  Seroquel Xr 300 Mg Xr24h-Tab (Quetiapine Fumarate) .Marland Kitchen.. 1 By Mouth At Bedtime 4)  Naproxen 500 Mg Tabs (Naproxen) .Marland Kitchen.. 1 By Mouth Every 12 Hours With Food  Allergies (verified): No Known Drug Allergies  Social  History: Packs/Day:  2.0  Physical Exam  General:  generally well appearing,no apparent distress Eyes:  clear sclerae.  Mouth:  moist mucus membranes Neck:  neck supple. No anterior cervical adenopathy. SCM tenderness bilaterally.  Chest Wall:  Tenderness to palpate along pectoralis mm bilaterally,  Lungs:  Normal respiratory effort, chest expands symmetrically. Lungs are clear to auscultation, no crackles or wheezes. Heart:  Normal rate and regular rhythm. S1 and S2 normal without gallop, murmur, click, rub or other extra sounds. Abdomen:  Soft nontender, no epigastric tenderness Pulses:  palpable radial pulses bilat Extremities:  Tenderness along anterior shoulder bilat (R greater than L).  Full handgrip and sensory hands intact on gross examination   Impression & Recommendations:  Problem # 1:  CHEST PAIN UNSPECIFIED (ICD-786.50) Suspect muscular etiology.  Will prescribe NSAID for chest wall pain.  Given strong family hx (father with CAD in late 66s, patient is a 2ppd smoker), will get baseline ECG today (NSR, HR 78, no ST-T changes to suggest ACS event). Last Lipids in 2009, is on simva $Remove'80mg'kdiiClT$  daily.  For lipid panel and CMet at next visit with Dr Buelah Manis.  Orders: 12 Lead EKG (  12 Lead EKG) [facility]- Est Level  3 ([address]759)  Problem # 2:  TOBACCO ABUSE (ICD-305.1)  Smokes 2ppd cigarettes. States it's the only thing that calms his nerves.  Baby-mom and his mother smoke, not interested in quitting.  19 yr old son, for whom he quit drugs.  Encouragement offered, however he is not ready to quit smoking.   Orders: [facility]- Est Level  3 ([address])  Complete Medication List: 1)  Prilosec 40 Mg Cpdr (Omeprazole) .Marland Kitchen.. 1 by mouth two times a day 2)  Simvastatin 80 Mg Tabs (Simvastatin) .Marland Kitchen.. 1 tablet a day for cholesterol take at bedtime. 3)  Seroquel Xr 300 Mg Xr24h-tab (Quetiapine fumarate) .Marland Kitchen.. 1 by mouth at bedtime 4)  Naproxen 500 Mg Tabs (Naproxen) .Marland Kitchen.. 1 by mouth every 12 hours with  food  Patient Instructions: 1)  It was a pleasure to see you today. I believe your chest pain is muscular, and will get better with an anti-inflammatory and some rest from lifting today.  I sent a prescription for Naproxen $RemoveBefo'500mg'ygaFzMcRhOi$ , take 1 tab twice daily for the next 5 to 7 days.  2)  I would like you to follow up wiht Dr Buelah Manis for your cholesterol, and to discuss ways to help prevent heart attacks in the future.  Prescriptions: NAPROXEN 500 MG TABS (NAPROXEN) 1 by mouth every 12 hours with food  #20 x 0   Entered and Authorized by:   Dalbert Mayotte MD   Signed by:   Dalbert Mayotte MD on 05/21/2010   Method used:   [facility]ronically to        [facility] [address] [phone]* (retail)       [address]       [place], [place]  [address]       Ph: [phone] or [phone]       Fax: [phone]   RxID:   [id number]    Prevention & Chronic Care Immunizations   Influenza vaccine: Fluvax Non-MCR  (07/25/2009)    Tetanus booster: 07/24/2003: Done.    Pneumococcal vaccine: Not documented  Other Screening   Smoking status: current  (05/21/2010)   Smoking cessation counseling: yes  (12/07/2008)  Lipids   Total Cholesterol: 240  (03/03/2008)   LDL: 178  (03/03/2008)   LDL Direct: Not documented   HDL: 31  (03/03/2008)   Triglycerides: 154  (03/03/2008)    SGOT (AST): 25  (07/25/2009)   SGPT (ALT): 43  (07/25/2009)   Alkaline phosphatase: 84  (07/25/2009)   Total bilirubin: 0.4  (07/25/2009)    Lipid flowsheet reviewed?: Yes   Progress toward LDL goal: Unchanged  Self-Management Support :    Lipid self-management support: Not documented

## 2010-11-19 NOTE — Assessment & Plan Note (Signed)
Summary: ear pain,df   Vital Signs:  Patient profile:   30 year old male Height:      67.5 inches Weight:      263 pounds BMI:     40.73 Temp:     98.5 degrees F oral Pulse rate:   92 / minute BP sitting:   119 / 75  (left arm) Cuff size:   large  Vitals Entered By: Levert Feinstein LPN (March 22, 5037 88:28 AM) CC: something stuck in left ear Is Patient Diabetic? No Pain Assessment Patient in pain? no        Primary Care Provider:  Vic Blackbird MD  CC:  something stuck in left ear.  History of Present Illness: 1. L ear Has foreing body sensation in the L ear for the past week. Not painful but does itch. Before this about 2 weeks ago, had the sensation of water in the ear. Put a qtip in to get it and noted blood coming from the ear. Covered the end of the qtip.   fevers:  no   chills: no   Headaches: yes.   PMHx: bipolar, had PE tubes as a child  2. allergies Has runny nose, sneezing, itchy eyes most of the year but especially bad now. Has tried things in the past but can't remember what they were  3. smoking 1.5 PPD. Not interested in quitting because it helps with his psych issues.  Habits & Providers  Alcohol-Tobacco-Diet     Tobacco Status: current     Tobacco Counseling: to quit use of tobacco products     Cigarette Packs/Day: 1.5  Current Medications (verified): 1)  Prilosec 40 Mg  Cpdr (Omeprazole) .Marland Kitchen.. 1 By Mouth Two Times A Day 2)  Simvastatin 80 Mg  Tabs (Simvastatin) .Marland Kitchen.. 1 Tablet A Day For Cholesterol Take At Bedtime. 3)  Seroquel Xr 300 Mg Xr24h-Tab (Quetiapine Fumarate) .... 2 By Mouth At Bedtime  Allergies (verified): No Known Drug Allergies  Social History: Packs/Day:  1.5  Review of Systems       review of systems as noted in HPI section   Physical Exam  General:  Vital signs reviewed -- obese but otherwise normal Alert, appropriate; well-dressed and well-nourished  Ears:  R ear canal clear. TM with sclerosis but otherwise normal.  L  ear canal mildly inflamed. No impaction or copious cerumen. Small amt of dried cerumen and or old blood inferiorly. Hearing grossly normal. No pain with traction on the auricle or tragus. TM mildly irritated and sclerosed.    Impression & Recommendations:  Problem # 1:  OTITIS EXTERNA (ICD-380.10) Assessment New  mild. given primary symptoms is itching will treat with topical cortisporin.   His updated medication list for this problem includes:    Cortisporin 3.5-10000-1 Soln (Neomycin-polymyxin-hc) .Marland KitchenMarland KitchenMarland KitchenMarland Kitchen 4 drops in affected ear 3-4 times a day x 7 days  Orders: [facility]- Est  Level 4 ([address]349)  Problem # 2:  ALLERGIC  RHINITIS (ICD-477.9) Assessment: Deteriorated  recommended otc antihistamine.   Orders: [facility]- Est  Level 4 ([address]15)  Problem # 3:  TOBACCO ABUSE (ICD-305.1) Assessment: Unchanged  pt not interested in quitting at this time.   Orders: [facility]- Est  Level 4 ([address])  Complete Medication List: 1)  Prilosec 40 Mg Cpdr (Omeprazole) .Marland Kitchen.. 1 by mouth two times a day 2)  Simvastatin 80 Mg Tabs (Simvastatin) .Marland Kitchen.. 1 tablet a day for cholesterol take at bedtime. 3)  Seroquel Xr 300 Mg Xr24h-tab (Quetiapine fumarate) .... 2  by mouth at bedtime 4)  Cortisporin 3.5-10000-1 Soln (Neomycin-polymyxin-hc) .... 4 drops in affected ear 3-4 times a day x 7 days  Patient Instructions: 1)  use OTC loratidine 10 mg once a day for your allergy symptoms 2)  use the ear drops for 7 days 3)  call or be seen for any fever, chills, pain, or other concerning symptoms.  Prescriptions: CORTISPORIN 3.5-10000-1 SOLN (NEOMYCIN-POLYMYXIN-HC) 4 drops in affected ear 3-4 times a day x 7 days  #1 bottle x 0   Entered and Authorized by:   Eugenie Norrie  MD   Signed by:   Eugenie Norrie  MD on 03/22/2010   Method used:   [facility] to        [facility] [address] [phone]* (retail)       [address]       [place], [place]  [address]       Ph: [phone] or [phone]        Fax: [phone]   RxID:   [id number]

## 2010-11-21 NOTE — Consult Note (Signed)
Summary: Disability Summary  Disability Summary   Imported By: Audie Clear 10/15/2010 10:50:39  _____________________________________________________________________  External Attachment:    Type:   Image     Comment:   External Document

## 2010-11-21 NOTE — Letter (Signed)
Summary: Lipid Letter  [facility]  [address]   [place], [place] [address]   Phone: [phone]  Fax: [phone]    10/23/2010  [name] [address] [place], [place]  [address]  Dear [name]:  We have carefully reviewed your last lipid profile from 10/17/2010 and the results are noted below with a summary of recommendations for lipid management.    Cholesterol:       125     Goal: < 200   HDL "good" Cholesterol:   22     Goal: > 40   LDL "bad" Cholesterol:   72     Goal: < 100   Triglycerides:       155     Goal: < 150  Your Liver enzymes were normal, your kidney function was normal.    Adjunctive Measures (may lower LIPIDS and reduce risk of Heart Attack) include: Aerobic Exercise (20-30 minutes 3-4 times a week) Limit Alcohol Consumption Weight Reduction Dietary Fiber 20-30 grams a day by mouth  Continue your current medications.    Current Medications: 1)    Prilosec 40 Mg  Cpdr (Omeprazole) .Marland Kitchen.. 1 by mouth by mouth daily 2)    Simvastatin 80 Mg  Tabs (Simvastatin) .Marland Kitchen.. 1 tablet a day for cholesterol take at bedtime. 3)    Seroquel Xr 300 Mg Xr24h-tab (Quetiapine fumarate) .Marland Kitchen.. 1 by mouth at bedtime  If you have any questions, please call. We appreciate being able to work with you.   Sincerely,    [name] Vic Blackbird MD
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
## 2011-01-12 ENCOUNTER — Emergency Department (HOSPITAL_COMMUNITY)
Admission: EM | Admit: 2011-01-12 | Discharge: 2011-01-13 | Disposition: A | Discharge status: Home / Self Care | Payer: Medicare Other | Attending: Emergency Medicine | Admitting: Emergency Medicine

## 2011-01-12 DIAGNOSIS — G7109 Other specified muscular dystrophies: Secondary | ICD-10-CM | POA: Insufficient documentation

## 2011-01-12 DIAGNOSIS — E7800 Pure hypercholesterolemia, unspecified: Secondary | ICD-10-CM | POA: Insufficient documentation

## 2011-01-12 DIAGNOSIS — M7989 Other specified soft tissue disorders: Secondary | ICD-10-CM | POA: Insufficient documentation

## 2011-01-12 DIAGNOSIS — M62838 Other muscle spasm: Secondary | ICD-10-CM | POA: Insufficient documentation

## 2011-01-12 DIAGNOSIS — F319 Bipolar disorder, unspecified: Secondary | ICD-10-CM | POA: Insufficient documentation

## 2011-01-12 DIAGNOSIS — F988 Other specified behavioral and emotional disorders with onset usually occurring in childhood and adolescence: Secondary | ICD-10-CM | POA: Insufficient documentation

## 2011-01-12 DIAGNOSIS — M79609 Pain in unspecified limb: Secondary | ICD-10-CM | POA: Insufficient documentation

## 2011-01-12 DIAGNOSIS — E669 Obesity, unspecified: Secondary | ICD-10-CM | POA: Insufficient documentation

## 2011-01-13 LAB — POCT I-STAT, CHEM 8
Calcium, Ion: 1.14 mmol/L (ref 1.12–1.32)
Chloride: 102 mEq/L (ref 96–112)
Glucose, Bld: 99 mg/dL (ref 70–99)
HCT: 47 % (ref 39.0–52.0)
TCO2: 26 mmol/L (ref 0–100)

## 2011-01-22 HISTORY — PX: NM MYOCAR PERF WALL MOTION: HXRAD629

## 2011-01-25 LAB — POCT URINALYSIS DIP (DEVICE)
Glucose, UA: NEGATIVE mg/dL
Hgb urine dipstick: NEGATIVE
Nitrite: POSITIVE — AB
Protein, ur: 100 mg/dL — AB
Specific Gravity, Urine: >=1.03 (ref 1.005–1.030)
Urobilinogen, UA: 2 mg/dL — ABNORMAL HIGH (ref 0.0–1.0)

## 2011-01-25 LAB — CBC
MCHC: 34.9 g/dL (ref 30.0–36.0)
MCV: 89.6 fL (ref 78.0–100.0)
Platelets: 163 10*3/uL (ref 150–400)
RBC: 5.05 MIL/uL (ref 4.22–5.81)
WBC: 11 10*3/uL — ABNORMAL HIGH (ref 4.0–10.5)

## 2011-01-25 LAB — HEMOCCULT GUIAC POC 1CARD (OFFICE): Fecal Occult Bld: NEGATIVE

## 2011-01-25 LAB — COMPREHENSIVE METABOLIC PANEL
ALT: 54 U/L — ABNORMAL HIGH (ref 0–53)
AST: 40 U/L — ABNORMAL HIGH (ref 0–37)
Albumin: 4 g/dL (ref 3.5–5.2)
Calcium: 9.2 mg/dL (ref 8.4–10.5)
Creatinine, Ser: 0.95 mg/dL (ref 0.4–1.5)
GFR calc Af Amer: >60 mL/min (ref >60)
Sodium: 137 mEq/L (ref 135–145)

## 2011-01-25 LAB — URINE CULTURE
Colony Count: NO GROWTH
Culture: NO GROWTH

## 2011-01-25 LAB — DIFFERENTIAL
Eosinophils Absolute: 0.2 10*3/uL (ref 0.0–0.7)
Eosinophils Relative: 2 % (ref 0–5)
Lymphocytes Relative: 14 % (ref 12–46)
Lymphs Abs: 1.6 10*3/uL (ref 0.7–4.0)
Monocytes Absolute: 0.8 10*3/uL (ref 0.1–1.0)
Monocytes Relative: 7 % (ref 3–12)

## 2011-01-25 LAB — GC/CHLAMYDIA PROBE AMP, GENITAL: GC Probe Amp, Genital: NEGATIVE

## 2011-02-10 ENCOUNTER — Encounter: Payer: Self-pay | Admitting: *Deleted

## 2011-02-27 ENCOUNTER — Encounter: Payer: Self-pay | Admitting: Family Medicine

## 2011-02-27 ENCOUNTER — Ambulatory Visit (INDEPENDENT_AMBULATORY_CARE_PROVIDER_SITE_OTHER): Payer: Medicare Other | Admitting: Family Medicine

## 2011-02-27 VITALS — BP 108/72 | HR 98 | Temp 98.1°F | Ht 68.0 in | Wt 270.0 lb

## 2011-02-27 DIAGNOSIS — F17200 Nicotine dependence, unspecified, uncomplicated: Secondary | ICD-10-CM

## 2011-02-27 DIAGNOSIS — R030 Elevated blood-pressure reading, without diagnosis of hypertension: Secondary | ICD-10-CM

## 2011-02-27 DIAGNOSIS — E669 Obesity, unspecified: Secondary | ICD-10-CM

## 2011-02-27 NOTE — Progress Notes (Signed)
  Subjective:    Patient ID: [name], male    DOB: Aug 26, 1981, 30 y.o.   MRN: [id number]  HPI  Pt seen at neurology office, BP was 180/53-- requesting his most recent labs as well Has seen Dr. Gwenlyn Found for chest pain, has had a myoview which had a mild abnormality in flow concerned for a probable small blockage, but medical management at this time. Has not had any further chest pain or difficulty breathing since seeing the cardiologist  Continues to smoke not intersted in [place] last cholesterol labs Updated medications  Pt to start at [facility] and has started swimming recently  Review of Systems     Objective:   Physical Exam  GEN- NAD, obese  CVS- RRR, no murmur  RESP- CTAB  Neck- no bruit, no thyromegaly  Ext-no edema       Assessment & Plan:   Elevated blood pressure- pt last 4 blood pressures over the past 1-2 years have been in normal range in our office less than 140/90. I will not start meds at this time especially with low normotensive BP today.   Tobacco use- pt not ready for any intervention, discussed his increased risks with family history and his smoking for cardiovascular disease  Obesity- to start exercising, he overall is not very motivated for many things  Records sent to Dr. Jannifer Franklin

## 2011-02-27 NOTE — Patient Instructions (Signed)
Take your blood pressure  Once a week and write it down I will send my note and the labs to Dr. Jannifer Franklin  Start your exercise program  Follow-up in 1 month for your blood pressure and bring the labs

## 2011-02-28 ENCOUNTER — Encounter: Payer: Self-pay | Admitting: Family Medicine
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
## 2011-03-04 NOTE — Procedures (Signed)
NAMEFLETCHER, OSTERMILLER                   ACCOUNT NO.:  [id number]   MEDICAL RECORD NO.:  [id number]          PATIENT TYPE:  OUT   LOCATION:  SLEEP CENTER                 FACILITY[facility]  [facility]   PHYSICIAN:  Clinton D. Annamaria Boots, MD, FCCP, FACPDATE OF BIRTH:  11/05/1980   DATE OF STUDY:                            NOCTURNAL POLYSOMNOGRAM   REFERRING PHYSICIAN:  Brion Aliment, M.D.   INDICATION FOR STUDY:  Hypersomnia with sleep apnea.   EPWORTH SLEEPINESS SCORE:  Epworth sleepiness score 3/24, BMI 40.7.  Weight 268 pounds, height 68 inches.  Neck 17 inches.   MEDICATIONS:  Home medications charted and reviewed.   SLEEP ARCHITECTURE:  Total sleep time 239 minutes with sleep efficiency  63.7%.  Stage I was 4.8%, stage II 65.9%, stage III 27.4%, REM 1.9% of  total sleep time.  Sleep latency 87 minutes, REM latency 142 minutes,  awake after sleep onset 50 minutes, arousal index 41.7 indicating  increased EEG arousal.  He complained of restlessness during the study.  Bedtime medication included Seroquel XR, simvastatin, omeprazole, and  aspirin.  This was a diagnostic NPSG protocol.   RESPIRATORY DATA:  Apnea/hypopnea index (AHI) 14.8 per hour.  A total of  59 events was scored with 1 obstructive apnea and 58 hypopneas.  Events  were not positional.  REM AHI 40 per hour.  An additional 43 respiratory  effort related arousals were counted for an overall cumulative  respiratory disturbance index (RDI) of 25.6 per hour.   OXYGEN DATA:  Moderate-to-loud snoring with oxygen desaturation to a  nadir of 89%.  Mean oxygen saturation through the study was 93.5% on  room air.  A total of 4.9 minutes was recorded with oxygen saturation  less than 88% on room air.   CARDIAC DATA:  Normal sinus rhythm.   MOVEMENT-PARASOMNIA:  No significant movement disturbance.  Bathroom x2.   IMPRESSIONS-RECOMMENDATIONS:  1. Moderate obstructive sleep apnea/hypopnea syndrome, AHI 14.8 per      hour, RDI 25.6 per hour.   Nonpositional      events with moderate-to-loud snoring and oxygen desaturation to a      nadir of 89%.  2. Consider return for CPAP titration or evaluate for alternative      management as clinically appropriate.      Clinton D. Annamaria Boots, MD, [facility], [place], Tax adviser of Sleep Medicine  Electronically Signed     CDY/MEDQ  D:  03/10/2009 10:54:47  T:  03/11/2009 05:27:15  Job:  [id number]
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
## 2011-06-05 ENCOUNTER — Ambulatory Visit (INDEPENDENT_AMBULATORY_CARE_PROVIDER_SITE_OTHER): Payer: Medicare Other | Admitting: Family Medicine

## 2011-06-05 ENCOUNTER — Encounter: Payer: Self-pay | Admitting: Family Medicine

## 2011-06-05 VITALS — BP 123/77 | HR 116 | Temp 98.9°F | Ht 67.5 in | Wt 267.7 lb

## 2011-06-05 DIAGNOSIS — H9220 Otorrhagia, unspecified ear: Secondary | ICD-10-CM

## 2011-06-05 DIAGNOSIS — H9210 Otorrhea, unspecified ear: Secondary | ICD-10-CM

## 2011-06-05 DIAGNOSIS — A088 Other specified intestinal infections: Secondary | ICD-10-CM

## 2011-06-05 DIAGNOSIS — A084 Viral intestinal infection, unspecified: Secondary | ICD-10-CM

## 2011-06-05 DIAGNOSIS — F17200 Nicotine dependence, unspecified, uncomplicated: Secondary | ICD-10-CM

## 2011-06-05 DIAGNOSIS — F1110 Opioid abuse, uncomplicated: Secondary | ICD-10-CM | POA: Insufficient documentation

## 2011-06-05 DIAGNOSIS — Z720 Tobacco use: Secondary | ICD-10-CM

## 2011-06-05 DIAGNOSIS — F1120 Opioid dependence, uncomplicated: Secondary | ICD-10-CM

## 2011-06-05 NOTE — Progress Notes (Signed)
  Subjective:    Patient ID: [name], male    DOB: 16-Dec-1980, 30 y.o.   MRN: [id number]  HPI 1. Diarrhea 4 days, no food source known, no animals. Has 3 sick contacts: two kids and a girlfriend living together with likely a viral GI illness. No travel. No contact with migrant workers. No drinking from unpurified water source. He has no fever, no blood or mucous in stool. His abdomen is soft, non tender, non distended. He is able to hold down food. He is staying hydrated. He says he has some epigastric cramping. No medications/drugs.  2. Bleeding from left ear Patient has 2 day history of bleeding from his left ear. His mother uses peroxide on a swab 2 times a week. No fevers. No external ear signs/tenderness. No TM bulging. No foreign bodies.  3. Tobacco Abuse Current everyday smoker, does not have a desire to quit at this time. Counseled on the health risks.   4. Narcotic abuse Patient has been clean for 3 years, no opoid use or withdrawal at this time. He was going to [employer], but felt he didn't need them.   Review of Systems  Constitutional: Positive for fatigue. Negative for fever, chills and unexpected weight change.  HENT: Positive for ear discharge. Negative for ear pain, congestion and tinnitus.   Respiratory: Negative for apnea, chest tightness and shortness of breath.   Cardiovascular: Negative for leg swelling.  Gastrointestinal: Positive for nausea, abdominal pain and diarrhea. Negative for vomiting, blood in stool, abdominal distention, anal bleeding and rectal pain.  Genitourinary: Negative for dysuria, hematuria, flank pain and difficulty urinating.  Musculoskeletal: Negative for back pain and joint swelling.  Skin: Negative for rash.       Objective:   Physical Exam  Nursing note and vitals reviewed. Constitutional: He appears well-developed and well-nourished.  HENT:  Right Ear: Hearing and external ear normal. No tenderness. Tympanic membrane is not  retracted and not bulging.  Left Ear: Hearing normal. No tenderness. Tympanic membrane is not retracted and not bulging.       Old blood in left ear and irritation of external canal. Right ear has red irritated dry skin in external canal.    Cardiovascular: Normal rate, regular rhythm and normal heart sounds.   Abdominal: Soft. He exhibits no distension. There is no tenderness. There is no rebound and no guarding.       No CVAT  Skin: He is not diaphoretic.      Assessment & Plan:  1. Diarrhea Likely Viral GI disease -advised to drink plenty of Gatorade/orange juice - advised of dehydration risks and electrolyte losses. He is to wash hands thoroughly and return to clinic if worsening symptoms.  2. Bleeding from left ear Excess use of peroxide in the ear with fluid loss from 4 days of diarrhea has likely led to dry irritated membranes in the ear canal bleeding. - stop putting foreign objects/chemicals in ear. Rinse with warm water in the shower.  3. Tobacco Abuse - advised to quit, does not desire to at this time.  4. Narcotic abuse 3 years clean, advised him to restart going to NA meetings to keep his recovery strong.

## 2011-06-09 ENCOUNTER — Other Ambulatory Visit: Payer: Self-pay | Admitting: Family Medicine

## 2011-06-09 MED ORDER — OMEPRAZOLE 40 MG PO CPDR: 40.0000 mg | DELAYED_RELEASE_CAPSULE | Freq: Every day | ORAL | Status: DC

## 2011-07-02 ENCOUNTER — Other Ambulatory Visit: Payer: Medicare Other

## 2011-07-07 ENCOUNTER — Encounter: Payer: Self-pay | Admitting: Family Medicine

## 2011-07-07 ENCOUNTER — Ambulatory Visit (INDEPENDENT_AMBULATORY_CARE_PROVIDER_SITE_OTHER): Payer: Medicare Other | Admitting: Family Medicine

## 2011-07-07 ENCOUNTER — Other Ambulatory Visit: Payer: Medicare Other

## 2011-07-07 VITALS — BP 146/86 | HR 107 | Temp 98.8°F | Ht 67.5 in | Wt 264.0 lb

## 2011-07-07 DIAGNOSIS — H60399 Other infective otitis externa, unspecified ear: Secondary | ICD-10-CM

## 2011-07-07 DIAGNOSIS — H6090 Unspecified otitis externa, unspecified ear: Secondary | ICD-10-CM | POA: Insufficient documentation

## 2011-07-07 MED ORDER — NEOMYCIN-POLYMYXIN-HC 3.5-10000-1 OT SOLN: 3.0000 [drp] | Freq: Three times a day (TID) | OTIC | Status: AC

## 2011-07-07 NOTE — Assessment & Plan Note (Signed)
Instructed not to use Q tips or anything else in ear canals, cortisporin otic for 7-10 days, teaching done regarding water in the ear.

## 2011-07-07 NOTE — Progress Notes (Signed)
  Subjective:    Patient ID: [name], male    DOB: 09/08/1981, 30 y.o.   MRN: [id number]  HPI Same Day Apt for bleeding from ears.  He says that he uses Q tips.  It started a few weeks ago.  He has a history of ear infections.  He asked about blood work that his heart doctor ordered, unable to find any future orders for labs in [place].  He did not bring up the pain complaint to me, so it was not addressed,  Noted that he has a history of narcotic abuse.  He is 67 and disabled.   Review of Systems  Constitutional: Negative for fever.  HENT: Positive for ear discharge. Negative for ear pain, congestion and sneezing.   Respiratory: Negative for cough.        Objective:   Physical Exam  Constitutional:       Obese, smelled strongly of cigarette smoke.  HENT:  Mouth/Throat: Oropharynx is clear and moist.       External canals:  Both with scabs and dried blood, TMs were intact with scarring from previous rupture or tubes.          Assessment & Plan:

## 2011-07-07 NOTE — Patient Instructions (Addendum)
Do not put anything in your ears Use the drops for 7-10 days You need to find out what labs your heart doctor wants to be done, and to return to see your regular doctor for this , prevention.

## 2011-07-11 ENCOUNTER — Other Ambulatory Visit: Payer: Self-pay | Admitting: Family Medicine

## 2011-07-11 MED ORDER — QUETIAPINE FUMARATE ER 300 MG PO TB24: 300.0000 mg | ORAL_TABLET | Freq: Every day | ORAL | Status: DC

## 2011-07-17 ENCOUNTER — Ambulatory Visit: Payer: Medicare Other

## 2011-07-21 ENCOUNTER — Ambulatory Visit: Payer: Medicare Other | Admitting: Family Medicine

## 2011-07-24 ENCOUNTER — Encounter: Payer: Self-pay | Admitting: Family Medicine

## 2011-07-24 ENCOUNTER — Ambulatory Visit (INDEPENDENT_AMBULATORY_CARE_PROVIDER_SITE_OTHER): Payer: Medicare Other | Admitting: Family Medicine

## 2011-07-24 VITALS — BP 130/81 | HR 106 | Temp 98.7°F | Ht 67.5 in | Wt 268.9 lb

## 2011-07-24 DIAGNOSIS — Z23 Encounter for immunization: Secondary | ICD-10-CM

## 2011-07-24 DIAGNOSIS — H6090 Unspecified otitis externa, unspecified ear: Secondary | ICD-10-CM

## 2011-07-24 DIAGNOSIS — H60399 Other infective otitis externa, unspecified ear: Secondary | ICD-10-CM

## 2011-07-24 DIAGNOSIS — E785 Hyperlipidemia, unspecified: Secondary | ICD-10-CM

## 2011-07-24 DIAGNOSIS — F17200 Nicotine dependence, unspecified, uncomplicated: Secondary | ICD-10-CM

## 2011-07-24 LAB — COMPREHENSIVE METABOLIC PANEL
ALT: 37 U/L (ref 0–53)
Albumin: 4.5 g/dL (ref 3.5–5.2)
CO2: 25 mEq/L (ref 19–32)
Chloride: 104 mEq/L (ref 96–112)
Potassium: 3.5 mEq/L (ref 3.5–5.3)
Sodium: 140 mEq/L (ref 135–145)
Total Bilirubin: 0.3 mg/dL (ref 0.3–1.2)
Total Protein: 7.1 g/dL (ref 6.0–8.3)

## 2011-07-24 LAB — LIPID PANEL
HDL: 31 mg/dL — ABNORMAL LOW (ref >39)
LDL Cholesterol: 65 mg/dL (ref 0–99)

## 2011-07-27 NOTE — Progress Notes (Signed)
  Subjective:    Patient ID: [name], male    DOB: [birth year], 30 y.o.   MRN: [id number]  HPI 1. F/u ears:  Here to f/u ears.  Seen a couple weeks ago for bleeding ears bilaterally.  Had been using q-tips chronically for ears itching and caused canal irritation.  Given cortisporin drops to try.  Since that time no further bleeding from ears.  Ears still itching some but improved.  Avoiding q tips. 2. HLD:  Requesting labs to follow up his cholesterol.  Currently on simvastatin, tolerating well.  Last labs in 2010 3.  Tobacco abuse:  Still smoking, not interested in quitting at this time.  Uses smoking to deal with stress.    Review of Systems     Objective:   Physical Exam  Constitutional: He appears well-developed and well-nourished. No distress.  HENT:  Right Ear: Hearing and external ear normal.  Left Ear: Hearing, tympanic membrane and external ear normal.       Canal on L with minimal dried blood and small abrasion in canal.  No inflammation or drainage in either canal.    Cardiovascular: Normal rate, regular rhythm and normal heart sounds.   Pulmonary/Chest: Effort normal and breath sounds normal.          Assessment & Plan:

## 2011-07-27 NOTE — Assessment & Plan Note (Signed)
Check fasting lipid panal

## 2011-07-27 NOTE — Assessment & Plan Note (Signed)
No interest in stopping smoking.  Explained to him that options are available if he reached a point where he is interested in quitting.

## 2011-07-27 NOTE — Assessment & Plan Note (Signed)
Improved, instructed not to use anything in ear canals to prevent this from happening again

## 2011-07-31 LAB — URINALYSIS, ROUTINE W REFLEX MICROSCOPIC
Hgb urine dipstick: NEGATIVE
Nitrite: NEGATIVE
Specific Gravity, Urine: 1.025
Urobilinogen, UA: 0.2

## 2011-07-31 LAB — BASIC METABOLIC PANEL
BUN: 9
CO2: 27
Chloride: 104
Creatinine, Ser: 0.97

## 2011-07-31 LAB — DIFFERENTIAL
Basophils Relative: 1
Eosinophils Absolute: 0.2
Neutrophils Relative %: 74

## 2011-07-31 LAB — TSH: TSH: 2.934

## 2011-07-31 LAB — HEPATIC FUNCTION PANEL
Indirect Bilirubin: 0.5
Total Protein: 6.8

## 2011-07-31 LAB — ETHANOL: Alcohol, Ethyl (B): <5

## 2011-07-31 LAB — CBC
MCHC: 34.9
MCV: 88.2
Platelets: 223

## 2011-07-31 LAB — URINE MICROSCOPIC-ADD ON

## 2011-07-31 LAB — RAPID URINE DRUG SCREEN, HOSP PERFORMED
Amphetamines: NOT DETECTED
Barbiturates: NOT DETECTED

## 2011-08-01 ENCOUNTER — Ambulatory Visit: Payer: Medicare Other

## 2011-08-05 LAB — I-STAT 8, (EC8 V) (CONVERTED LAB)
Acid-Base Excess: 2
Chloride: 102
Glucose, Bld: 102 — ABNORMAL HIGH
Hemoglobin: 17.3 — ABNORMAL HIGH
Potassium: 3.5
pH, Ven: 7.436 — ABNORMAL HIGH

## 2011-08-05 LAB — ROCKY MTN SPOTTED FVR AB, IGG-BLOOD: RMSF IgG: 0.06 {ISR}

## 2011-08-05 LAB — CBC
HCT: 45.6
Hemoglobin: 15.8
Platelets: 151
WBC: 13.7 — ABNORMAL HIGH

## 2011-08-11 ENCOUNTER — Telehealth: Payer: Self-pay | Admitting: Family Medicine

## 2011-08-11 ENCOUNTER — Encounter: Payer: Self-pay | Admitting: Family Medicine

## 2011-08-11 MED ORDER — QUETIAPINE FUMARATE ER 300 MG PO TB24: 300.0000 mg | ORAL_TABLET | Freq: Every day | ORAL | Status: DC

## 2011-08-11 NOTE — Telephone Encounter (Signed)
Fwd. To PCP for refill. .[name]  

## 2011-08-11 NOTE — Telephone Encounter (Signed)
[name] is out of his Seroquel XR.  [facility] [address] [facility] mom that he doesn't have anymore refills even though in September the Rx was for 3 refills.  He is almost out and mom would like this taken care of today.  Also, he had blood work done on the last visit, but they have not gotten the results yet.

## 2011-08-11 NOTE — Telephone Encounter (Signed)
Should have had additional refills.  Did receive refill request from pharmacy today as well, listing 0 refills available.  Will send back over with 3 refills once again.

## 2011-08-29 ENCOUNTER — Encounter: Payer: Self-pay | Admitting: Family Medicine

## 2011-08-29 ENCOUNTER — Ambulatory Visit (INDEPENDENT_AMBULATORY_CARE_PROVIDER_SITE_OTHER): Payer: Medicare Other | Admitting: Family Medicine

## 2011-08-29 VITALS — BP 120/80 | HR 104 | Temp 98.2°F | Ht 68.0 in | Wt 272.6 lb

## 2011-08-29 DIAGNOSIS — F319 Bipolar disorder, unspecified: Secondary | ICD-10-CM

## 2011-08-29 MED ORDER — QUETIAPINE FUMARATE ER 300 MG PO TB24: 600.0000 mg | ORAL_TABLET | Freq: Every day | ORAL | Status: DC

## 2011-08-29 NOTE — Patient Instructions (Signed)
I would like for you to increase your dose of seroquel to $RemoveBef'600mg'svAakWqpdd$  before bedtime.  I would like to see you back in 1-2 weeks to see how you are doing on the new adjusted dose.  I would also like for you to follow up with mental health if you are still having symptoms.  Remember if you need to talk to anyone we have someone on call 24/7 or you can call the behavioral health hospital.

## 2011-08-29 NOTE — Assessment & Plan Note (Signed)
Patient with history of bipolar disorder that has been stable on 300 mg of Seroquel until recently. No increased stressors in his life currently. Plan to increase Seroquel to 600 mg each bedtime to try to gain better control of his bipolar disorder. Advised him that he should try to get back in with mental health of [facility] for continued medication adjustment. I will plan to see him back in one to 2 weeks to see how he's doing on the adjusted dose. Advised him that if he had any questions or any thoughts of hurting self or others to call our office or call behavioral health Hospital. Plan recheck CBC at next appointment with increased dose of Seroquel.

## 2011-08-29 NOTE — Progress Notes (Signed)
  Subjective:    Patient ID: [name], male    DOB: 06-May-1981, 30 y.o.   MRN: [id number]  HPI 1. Bipolar disorder: Guilherme is here today to discuss medication adjustment. His long history of bipolar disorder and is currently on Seroquel. He feels that his current medication is not working as well as it used to. He has had multiple episodes where he is somewhat anxious and he is unable to control his impulses and temper. He said that he has been "acting out". He denies any increasing stress. He denies getting in to high-risk situations including spending money worries shouldn't be. He does admit to decreased sleep patterns he states he sleeps around 3-4 hours per night. He does report feeling sleepy during daytime and also takes naps during the daytime as well. He was recently prescribed Seroquel by mental health of [facility]. He states he has not followed up with them in greater than 2 years. Currently he denies any thoughts of hurting himself or others. He reports his mood is calm today. Review of Systems     Objective:   Physical Exam  Gen: Obese male in no acute distress, has a strong smell of cigarette smoke, and blue hair Neurological: He is alert and oriented x3. Psych: Mood is calm and affect was mood congruent. Speech is somewhat slowed, without slurring. Behaviors with some psychomotor slowing, but does not seem to be endorsing any internal stimuli. The content seems rational without homicidal or suicidal ideation.        Assessment & Plan:

## 2011-09-08 ENCOUNTER — Ambulatory Visit: Payer: Medicare Other | Admitting: Family Medicine

## 2011-09-08 ENCOUNTER — Other Ambulatory Visit: Payer: Self-pay | Admitting: Family Medicine

## 2011-09-08 MED ORDER — SIMVASTATIN 80 MG PO TABS: 80.0000 mg | ORAL_TABLET | Freq: Every day | ORAL | Status: DC

## 2011-09-19 ENCOUNTER — Ambulatory Visit: Payer: Medicare Other | Admitting: Family Medicine

## 2011-10-02 ENCOUNTER — Ambulatory Visit (INDEPENDENT_AMBULATORY_CARE_PROVIDER_SITE_OTHER): Payer: Medicare Other | Admitting: Family Medicine

## 2011-10-02 ENCOUNTER — Encounter: Payer: Self-pay | Admitting: Family Medicine

## 2011-10-02 DIAGNOSIS — F319 Bipolar disorder, unspecified: Secondary | ICD-10-CM

## 2011-10-02 DIAGNOSIS — Z79899 Other long term (current) drug therapy: Secondary | ICD-10-CM

## 2011-10-02 MED ORDER — QUETIAPINE FUMARATE ER 150 MG PO TB24: 450.0000 mg | ORAL_TABLET | Freq: Every day | ORAL | Status: DC

## 2011-10-02 NOTE — Patient Instructions (Addendum)
It was good seeing you today I have sent in a new prescription for Seroquel $RemoveBefo'150mg'czqFzeTuJJF$  tablets, take 3 of these at bedtime ($RemoveBef'450mg'jtbjtcLcWV$ ) After you start your new prescription stop by our office one day this week to have your blood count checked. I want you to come back in two weeks to see how things are going on the adjusted dose. Please call if you have questions

## 2011-10-06 NOTE — Assessment & Plan Note (Addendum)
Will change dose to $Remov'450mg'bMeGeQ$  as $R'600mg'ED$  seemed to be too sedating.  Will have him return in two weeks to follow up.  CBC ordered, instructed to come in to have drawn this week once on increased dose.

## 2011-10-06 NOTE — Progress Notes (Signed)
  Subjective:    Patient ID: DOCK BACCAM, male    DOB: January 04, 1981, 30 y.o.   MRN: [id number]  HPI 1.  F/u meds:  Here to f/u on medication.  Recently increased seroquel to $RemoveBef'600mg'vSFZlbaBOx$ .  Took for a couple of days but felt like it made him too sedated so he cut back to $Remov'300mg'EKlaWC$ .  Still feels like he is still  "acting out" on $Remov'300mg'SGWVOi$ .  Has not contacted [facility].     Review of Systems     Objective:   Physical Exam  Constitutional: He appears well-nourished. No distress.       Smells strongly of cigarette smoke  Psychiatric: He has a normal mood and affect. Thought content normal. His speech is delayed. He is withdrawn. He expresses impulsivity. He expresses no homicidal and no suicidal ideation. He expresses no suicidal plans and no homicidal plans.          Assessment & Plan:

## 2011-10-16 ENCOUNTER — Ambulatory Visit (INDEPENDENT_AMBULATORY_CARE_PROVIDER_SITE_OTHER): Payer: Medicare Other | Admitting: Family Medicine

## 2011-10-16 ENCOUNTER — Encounter: Payer: Self-pay | Admitting: Family Medicine

## 2011-10-16 DIAGNOSIS — F319 Bipolar disorder, unspecified: Secondary | ICD-10-CM

## 2011-10-16 DIAGNOSIS — R6889 Other general symptoms and signs: Secondary | ICD-10-CM | POA: Insufficient documentation

## 2011-10-16 NOTE — Progress Notes (Signed)
  Subjective:    Patient ID: [name], male    DOB: September 06, 1981, 30 y.o.   MRN: [id number]  HPI 1. Flu Like symptoms: [name] is a 30 y.o. male who present complaining of flu-like symptoms: fevers, chills, myalgias, congestion, sore throat and cough for 5 days. Mild wheezing has been using mom's nebulizer.  Continues to smoke 1ppd.  Using OTC robistussin and ibuprofen.  2. Bipolar d/o:  Recently increased dosage of seroquel to $RemoveBef'450mg'gaSUIUwIUs$ .  Tolerating increase in medication better than previous increase to $RemoveBef'600mg'mqWUdfvfMh$ .  No further "outbursts" and not too sedated. Feels like he is doing much better on current dose.        Review of Systems Per HPI    Objective:   Physical Exam Filed Vitals:   10/16/11 1544  BP: 126/80  Pulse: 99  Temp: 98.2 F (36.8 C)    Appears moderately ill but not toxic; temperature as noted in vitals.  HEENT:Ears normal. Throat and pharynx normal.  Sinuses non tender.  Neck: supple. No adenopathy in the neck.  Pulm:CTAB, no wheezing on exam  Psych:  Mood and affect normal, no HI or SI.  Does not appear to be responding to internal stimuli. Alert and oriented.       Assessment & Plan:

## 2011-10-16 NOTE — Assessment & Plan Note (Addendum)
Seems to be on a stable dose of seroquel at this time.  Advised that he should still get in with psychiatrist to further monitoring and medication titration.

## 2011-10-16 NOTE — Assessment & Plan Note (Signed)
Symptoms appear to be resolving, out of window for a tamiflu.  Given red flags that should prompt his return.

## 2011-12-01 ENCOUNTER — Encounter: Payer: Self-pay | Admitting: Family Medicine

## 2011-12-01 ENCOUNTER — Ambulatory Visit (INDEPENDENT_AMBULATORY_CARE_PROVIDER_SITE_OTHER): Payer: Medicare Other | Admitting: Family Medicine

## 2011-12-01 VITALS — BP 121/73 | HR 85 | Temp 98.1°F | Ht 68.0 in | Wt 276.0 lb

## 2011-12-01 DIAGNOSIS — H60399 Other infective otitis externa, unspecified ear: Secondary | ICD-10-CM

## 2011-12-01 DIAGNOSIS — H6090 Unspecified otitis externa, unspecified ear: Secondary | ICD-10-CM

## 2011-12-01 MED ORDER — NEOMYCIN-POLYMYXIN-HC 3.5-10000-1 OT SOLN: 3.0000 [drp] | Freq: Three times a day (TID) | OTIC | Status: AC

## 2011-12-01 NOTE — Progress Notes (Signed)
  Subjective:    Patient ID: [name], male    DOB: Sep 09, 1981, 31 y.o.   MRN: [id number]  HPI Acute visit: ear-popping x 1 week  Sick with a cold about 1 month ago. Improved. However, contains to have nasal congestion, post-nasal drip, and now ear-popping. Reports history of frequent ear infections. Used to have ear tubes as a child.  Not taking any medications currently.  Still smoking.  Occasional cough, worse at night.   Review of Systems Denies wheezing, difficulty breathing. Denies nausea/vomiting.     Objective:   Physical Exam Gen: NAD, smell strongly of tobacco HEENT:    Eyes normal conjunctiva   Ears: TM scarring bilaterally but without effusion; ear canals erythematous but without pain with tragal pressure   Nose: nasal congestion   Throat: no erythema or tonsillar adenopathy   Neck: no LAD Pulm: CTAB without w/r/r; NI WOB    Assessment & Plan:

## 2011-12-01 NOTE — Assessment & Plan Note (Addendum)
Potential otitis externa. Some erythema but no tenderness. Patient reports similar symptoms and improvement with ear drops. Rx given.  He has frequent episodes of otitis externa it seems. Again advised not to put anything (e.g., Q-tips) in ear canals.

## 2011-12-01 NOTE — Patient Instructions (Signed)
Use the ear drops. Try the humidifier.  Follow-up in 1 week with your PCP if your symptoms are worse or not improving.

## 2011-12-05 ENCOUNTER — Encounter: Payer: Self-pay | Admitting: Family Medicine

## 2011-12-05 ENCOUNTER — Ambulatory Visit (INDEPENDENT_AMBULATORY_CARE_PROVIDER_SITE_OTHER): Payer: Medicare Other | Admitting: Family Medicine

## 2011-12-05 DIAGNOSIS — H6590 Unspecified nonsuppurative otitis media, unspecified ear: Secondary | ICD-10-CM | POA: Insufficient documentation

## 2011-12-05 DIAGNOSIS — H938X9 Other specified disorders of ear, unspecified ear: Secondary | ICD-10-CM | POA: Insufficient documentation

## 2011-12-05 MED ORDER — FLUTICASONE PROPIONATE 50 MCG/ACT NA SUSP: 2.0000 | Freq: Every day | NASAL | Status: DC

## 2011-12-05 NOTE — Assessment & Plan Note (Signed)
Ear fullness bilaterally. Will try Flonase as patient has significant seasonal allergy history. Gave instructions to return if sensation does not get better within several weeks. Stopped ear drops.

## 2011-12-05 NOTE — Progress Notes (Signed)
  Subjective:    Patient ID: [name], male    DOB: 1981-03-13, 31 y.o.   MRN: [id number]  HPI Ear Fullness-patient was seen approximately one week ago in clinic for some ear pain bilaterally. He was prescribed drops for external otitis. He used to drops twice a day for about 3 days but felt that they were not helping. He states it is ears feel full but are not painful. They do have some popping that is painful bilaterally. His hearing is slightly decreased. He denies sinus congestion, rhinorrhea, headaches, fevers. He reports a history of seasonal allergies as well a 2 pack per day smoking history.   Review of Systems See above    Objective:   Physical Exam Vital signs reviewed General appearance - alert, well appearing, and in no distress and oriented to person, place, and time HEENT-no tenderness with manipulation of the tragus on either ear. TMs with some effusion bilaterally, no redness. There is some white matter collected on the left eardrum.        Assessment & Plan:

## 2011-12-05 NOTE — Patient Instructions (Signed)
Serous Otitis Media   Serous otitis media is also known as otitis media with effusion (OME). It means there is fluid in the middle ear space. This space contains the bones for hearing and air. Air in the middle ear space helps to transmit sound.   The air gets there through the eustachian tube. This tube goes from the back of the throat to the middle ear space. It keeps the pressure in the middle ear the same as the outside world. It also helps to drain fluid from the middle ear space. CAUSES   OME occurs when the eustachian tube gets blocked. Blockage can come from:  Ear infections.     Colds and other upper respiratory infections.     Allergies.    Irritants such as cigarette smoke.     Sudden changes in air pressure (such as descending in an airplane).     Enlarged adenoids.  During colds and upper respiratory infections, the middle ear space can become temporarily filled with fluid. This can happen after an ear infection also. Once the infection clears, the fluid will generally drain out of the ear through the eustachian tube. If it does not, then OME occurs. SYMPTOMS    Hearing loss.     A feeling of fullness in the ear - but no pain.     Young children may not show any symptoms.  DIAGNOSIS    Diagnosis of OME is made by an ear exam.     Tests may be done to check on the movement of the eardrum.     Hearing exams may be done.  TREATMENT    The fluid most often goes away without treatment.     If allergy is the cause, allergy treatment may be helpful.     Fluid that persists for several months may require minor surgery. A small tube is placed in the ear drum to:     Drain the fluid.     Restore the air in the middle ear space.     In certain situations, antibiotics are used to avoid surgery.     Surgery may be done to remove enlarged adenoids (if this is the cause).  HOME CARE INSTRUCTIONS    Keep children away from tobacco smoke.     Be sure to keep follow up  appointments, if any.  SEEK MEDICAL CARE IF:    Hearing is not better in 3 months.     Hearing is worse.     Ear pain.     Drainage from the ear.     Dizziness.  Document Released: 12/27/2003 Document Revised: 06/18/2011 Document Reviewed: 10/26/2008 [facility] Patient Information 2012 [place].

## 2012-01-09 ENCOUNTER — Other Ambulatory Visit: Payer: Self-pay | Admitting: Family Medicine

## 2012-01-12 NOTE — Telephone Encounter (Signed)
Mom is calling because [facility] [address] [facility] not [facility] for [employer] and he is out.  It is not in Dr. Reece Packer.

## 2012-03-09 ENCOUNTER — Other Ambulatory Visit: Payer: Self-pay | Admitting: Family Medicine

## 2012-03-09 MED ORDER — OMEPRAZOLE 40 MG PO CPDR: 40.0000 mg | DELAYED_RELEASE_CAPSULE | Freq: Every day | ORAL | Status: DC

## 2012-04-16 ENCOUNTER — Telehealth: Payer: Self-pay | Admitting: Family Medicine

## 2012-04-16 ENCOUNTER — Other Ambulatory Visit: Payer: Self-pay | Admitting: *Deleted

## 2012-04-16 NOTE — Telephone Encounter (Addendum)
Mom called and was wanting a refill for seroquel. As I looked back at his OV with Dr. Zigmund Daniel the plan was as follows:   [place], DO 10/16/2011 4:35 PM Addendum  Seems to be on a stable dose of seroquel at this time. Advised that he should still get in with psychiatrist to further monitoring and medication titration.  I told mom of this and she stated that she had no idea that this was to be done I told her that she will need to call the psychiatrist to make an appt with them for monitoring of his meds. I made an appt for him to be seen for 7.3.2013 he will come in with his mother seeing that she also has an appt for the same day.Audelia Hives [place]

## 2012-04-19 MED ORDER — QUETIAPINE FUMARATE ER 150 MG PO TB24: 450.0000 mg | ORAL_TABLET | Freq: Every day | ORAL | Status: DC

## 2012-04-21 ENCOUNTER — Ambulatory Visit (INDEPENDENT_AMBULATORY_CARE_PROVIDER_SITE_OTHER): Payer: Medicare Other | Admitting: Family Medicine

## 2012-04-21 ENCOUNTER — Encounter: Payer: Self-pay | Admitting: Family Medicine

## 2012-04-21 ENCOUNTER — Other Ambulatory Visit: Payer: Medicare Other

## 2012-04-21 VITALS — BP 120/70 | HR 100 | Ht 68.0 in | Wt 280.7 lb

## 2012-04-21 DIAGNOSIS — R030 Elevated blood-pressure reading, without diagnosis of hypertension: Secondary | ICD-10-CM

## 2012-04-21 DIAGNOSIS — F319 Bipolar disorder, unspecified: Secondary | ICD-10-CM

## 2012-04-21 DIAGNOSIS — Z79899 Other long term (current) drug therapy: Secondary | ICD-10-CM

## 2012-04-21 DIAGNOSIS — F17200 Nicotine dependence, unspecified, uncomplicated: Secondary | ICD-10-CM

## 2012-04-21 LAB — CBC
MCH: 29.9 pg (ref 26.0–34.0)
Platelets: 175 10*3/uL (ref 150–400)
RBC: 5.01 MIL/uL (ref 4.22–5.81)
RDW: 14.6 % (ref 11.5–15.5)

## 2012-04-21 NOTE — Patient Instructions (Addendum)
Thank you for coming in today, it was good to see you I will continue to fill your seroquel until your appointment with psychiatry on 8/28, I would like them to take over this at that point. The symptoms you are describing sound like panic attacks, you can discuss this with your psychiatrist when you see them as well, they will be better able to decide the best treatment for this

## 2012-04-25 ENCOUNTER — Encounter: Payer: Self-pay | Admitting: Family Medicine

## 2012-04-25 NOTE — Progress Notes (Signed)
  Subjective:    Patient ID: [name], male    DOB: [birth year], 31 y.o.   MRN: [id number]  HPI  1.  Bipolar d/o:  Patient here to discuss refill for bipolar medications.  Feels like seroquel makes him more irritable sometimes.  Also having increased anxiety when out in public and in crowded spaces.  He has an appointment with mental health on 8/28.  Denies thoughts of hurting himself or others.    2.  Elevated blood pressure: Has not had elevated bp in the past.  Smoked a cigarette about 5 min prior to arrival to appointment.  Denies chest pain, headache, palpitations, vision changes.  3.  Tobacco abuse:  Still smoking daily, 2ppd.  He is not interested in quitting.  Denies shortness of breath     Review of Systems Per HPI    Objective:   Physical Exam  Constitutional: He is oriented to person, place, and time.       Obese male, nad   HENT:  Head: Normocephalic and atraumatic.  Neck: Neck supple. No thyromegaly present.  Cardiovascular: Normal rate, regular rhythm and normal heart sounds.   Pulmonary/Chest: Effort normal and breath sounds normal.  Neurological: He is alert and oriented to person, place, and time.  Psychiatric: He has a normal mood and affect. His behavior is normal. Thought content normal.          Assessment & Plan:

## 2012-04-25 NOTE — Assessment & Plan Note (Signed)
Patient not interested in quitting.  Counseled on effects of smoking.

## 2012-04-25 NOTE — Assessment & Plan Note (Signed)
Possibly 2/2 to tobacco abuse.  Recheck manually is 120/70, which is consistent with previous readings.  Advised avoidance of tobacco, starting exercise.

## 2012-04-25 NOTE — Assessment & Plan Note (Signed)
I have explained to him in the past that he needs to follow up with mental health and I would be more comfortable if they took over rx of  These medications.  Advised that he can also discuss panic symptoms with them at that time.  Will refill seroquel until seen by mental health.

## 2012-04-30 ENCOUNTER — Other Ambulatory Visit: Payer: Self-pay | Admitting: *Deleted

## 2012-04-30 DIAGNOSIS — E785 Hyperlipidemia, unspecified: Secondary | ICD-10-CM

## 2012-04-30 MED ORDER — SIMVASTATIN 80 MG PO TABS: 80.0000 mg | ORAL_TABLET | Freq: Every day | ORAL | Status: DC

## 2012-05-19 ENCOUNTER — Encounter: Payer: Self-pay | Admitting: Family Medicine

## 2012-05-21 ENCOUNTER — Other Ambulatory Visit: Payer: Self-pay | Admitting: *Deleted

## 2012-05-22 MED ORDER — QUETIAPINE FUMARATE ER 150 MG PO TB24: 450.0000 mg | ORAL_TABLET | Freq: Every day | ORAL | Status: DC

## 2012-05-22 NOTE — Telephone Encounter (Signed)
Patient has appointment with mental health on 8/28; psychiatry should take over management at that point. No further refills will be given by our practice unless patient is seen here.

## 2012-06-16 ENCOUNTER — Telehealth: Payer: Self-pay | Admitting: Family Medicine

## 2012-06-16 ENCOUNTER — Other Ambulatory Visit: Payer: Self-pay | Admitting: Family Medicine

## 2012-06-16 NOTE — Telephone Encounter (Signed)
Went to mental health today and they couldn't see him b/c it would cost the $150 and they don't have that  -  He needs refill on his Seroquel - [facility] [address]

## 2012-06-16 NOTE — Telephone Encounter (Signed)
Will forward message to Dr. Ardelia Mems.

## 2012-06-17 NOTE — Telephone Encounter (Signed)
Spoke with Dr. Ardelia Mems and she will consult with preceptor.

## 2012-06-17 NOTE — Telephone Encounter (Signed)
Please call pt and let him know I have sent in one month's worth of Seroquel but he needs to make an appointment to be seen and have bloodwork checked before future refills will be given. Thanks!

## 2012-06-17 NOTE — Telephone Encounter (Signed)
Mom is calling about the refill for Seroquel.  She said that Dr. Zigmund Daniel wanted him to go through [place], but they are going to have to pay out $150 in order to be seen and they don't have the money.

## 2012-06-17 NOTE — Telephone Encounter (Signed)
Appointment scheduled 07/09/2012 @ 3pm with Dr. Ardelia Mems.  Patient informed that one month supply of his Seroquel was sent to pharmacy but must be seen before any more refills will be given.  Lauralyn Primes

## 2012-07-09 ENCOUNTER — Encounter: Payer: Medicare Other | Admitting: Family Medicine

## 2012-07-09 NOTE — Progress Notes (Signed)
Patient ID: [name], male   DOB: 1981/05/22, 31 y.o.   MRN: [id number]  Encounter opened in error. Patient no-showed.

## 2012-07-23 ENCOUNTER — Ambulatory Visit (INDEPENDENT_AMBULATORY_CARE_PROVIDER_SITE_OTHER): Payer: Medicare Other | Admitting: Family Medicine

## 2012-07-23 ENCOUNTER — Encounter: Payer: Self-pay | Admitting: Family Medicine

## 2012-07-23 VITALS — BP 139/83 | HR 111 | Ht 68.0 in | Wt 275.0 lb

## 2012-07-23 DIAGNOSIS — Z0000 Encounter for general adult medical examination without abnormal findings: Secondary | ICD-10-CM

## 2012-07-23 DIAGNOSIS — F319 Bipolar disorder, unspecified: Secondary | ICD-10-CM

## 2012-07-23 DIAGNOSIS — E785 Hyperlipidemia, unspecified: Secondary | ICD-10-CM

## 2012-07-23 DIAGNOSIS — Z23 Encounter for immunization: Secondary | ICD-10-CM

## 2012-07-23 DIAGNOSIS — F17200 Nicotine dependence, unspecified, uncomplicated: Secondary | ICD-10-CM

## 2012-07-23 MED ORDER — QUETIAPINE FUMARATE ER 150 MG PO TB24: 450.0000 mg | ORAL_TABLET | Freq: Every day | ORAL | Status: DC

## 2012-07-23 NOTE — Patient Instructions (Addendum)
Nice to meet you today. I am refilling your Seroquel. I would like to see you back in 3 months, or sooner if you have any problems with your medicine or mood.   Please go see an eye doctor to get your eyes checked.  I will call you if your test results are not normal.  Otherwise, I will send you a letter.  If you do not hear from me with in 2 weeks please call our office.

## 2012-07-24 LAB — LDL CHOLESTEROL, DIRECT: Direct LDL: 63 mg/dL

## 2012-07-25 DIAGNOSIS — Z0000 Encounter for general adult medical examination without abnormal findings: Secondary | ICD-10-CM | POA: Insufficient documentation

## 2012-07-25 NOTE — Assessment & Plan Note (Signed)
Check direct LDL today. Continue simvastatin 80mg  daily - will titrate or change as needed pending results of LDL.

## 2012-07-25 NOTE — Assessment & Plan Note (Signed)
Currently pt has no desire to quit. Offered to discuss quit strategies when and if he decides he would like to quit. Will attempt motivational interviewing at next visit - did not have time today.

## 2012-07-25 NOTE — Assessment & Plan Note (Signed)
Flu shot given today

## 2012-07-25 NOTE — Assessment & Plan Note (Addendum)
Mood stable. No SI/HI. Will refill seroquel. See patient back in 3 months.   For Seroquel monitoring: Monitor weight - morbidly obese but  Needs A1c annually - will check today. Follow lipids - will check direct LDL today. See eye doctor every 6 months - pt will make an appointment Thyroid - no recent thyroid studies so will check TSH today.

## 2012-07-25 NOTE — Progress Notes (Signed)
Patient ID: [name], male   DOB: 1981-03-17, 31 y.o.   MRN: [id number]  HPI: [name] is a 31 y.o. male here to meet his new doctor and get a refill on his seroquel which he takes for bipolar disorder. He had previously seen Dr. Zigmund Daniel of our practice for his bipolar disorder, who requested that pt make an appointment to be seen by psychiatry. Pt did this but was unable to be seen due to his health insurance not being taken. Pt states he has been on the seroquel for around 5 years and has no problems with his medications.  Pt reports his mood has been stable and that he is in a good mood today. 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 He works at nights, and stays up til 5 am and sleeps until 12 or 1pm. Has had a good appetite. Denies SI/HI, AVH, racing thoughts, or rapid speech. No problems with pain in his body. Feels motivated on a daily basis to do the things he needs to do.   PMH: Smokes cigarettes - not interested in quitting at this time.  Physical Exam: Gen: NAD, obese, cooperative, smells strongly of cigarette smoke Heart: RRR Lungs: CTAB Neuro: nonfocal, speech intact Psych: full range of affect, no evidence of SI/HI, not responding to internal stimuli, normal thought process.

## 2012-07-28 ENCOUNTER — Telehealth: Payer: Self-pay | Admitting: Family Medicine

## 2012-07-28 DIAGNOSIS — F319 Bipolar disorder, unspecified: Secondary | ICD-10-CM

## 2012-07-28 DIAGNOSIS — E785 Hyperlipidemia, unspecified: Secondary | ICD-10-CM

## 2012-07-28 MED ORDER — SIMVASTATIN 40 MG PO TABS: 40.0000 mg | ORAL_TABLET | Freq: Every day | ORAL | Status: DC

## 2012-07-28 NOTE — Telephone Encounter (Signed)
Called patient to discuss results and spoke with him on the phone. LDL low enough that we can decrease his simvastatin dose to $Remov'40mg'YHitVd$  daily.   TSH normal. A1c did not get drawn, so pt will come in for a lab appointment next week to get this checked.

## 2012-09-18 ENCOUNTER — Other Ambulatory Visit: Payer: Self-pay | Admitting: Family Medicine

## 2012-09-21 ENCOUNTER — Other Ambulatory Visit: Payer: Self-pay | Admitting: Family Medicine

## 2012-10-01 ENCOUNTER — Ambulatory Visit (HOSPITAL_COMMUNITY)
Admission: RE | Admit: 2012-10-01 | Discharge: 2012-10-01 | Disposition: A | Discharge status: Home / Self Care | Payer: Medicare Other | Source: Ambulatory Visit | Attending: Family Medicine | Admitting: Family Medicine

## 2012-10-01 ENCOUNTER — Ambulatory Visit (INDEPENDENT_AMBULATORY_CARE_PROVIDER_SITE_OTHER): Payer: Medicare Other | Admitting: Family Medicine

## 2012-10-01 ENCOUNTER — Encounter: Payer: Self-pay | Admitting: Family Medicine

## 2012-10-01 VITALS — BP 131/70 | HR 119 | Temp 98.2°F | Ht 68.0 in | Wt 270.0 lb

## 2012-10-01 DIAGNOSIS — F319 Bipolar disorder, unspecified: Secondary | ICD-10-CM

## 2012-10-01 DIAGNOSIS — Z79899 Other long term (current) drug therapy: Secondary | ICD-10-CM

## 2012-10-01 DIAGNOSIS — R079 Chest pain, unspecified: Secondary | ICD-10-CM | POA: Insufficient documentation

## 2012-10-01 DIAGNOSIS — J069 Acute upper respiratory infection, unspecified: Secondary | ICD-10-CM

## 2012-10-01 DIAGNOSIS — E785 Hyperlipidemia, unspecified: Secondary | ICD-10-CM

## 2012-10-01 LAB — CBC WITH DIFFERENTIAL/PLATELET
Basophils Absolute: 0.1 10*3/uL (ref 0.0–0.1)
Basophils Relative: 1 % (ref 0–1)
Eosinophils Absolute: 0.4 10*3/uL (ref 0.0–0.7)
Eosinophils Relative: 4 % (ref 0–5)
MCH: 30.3 pg (ref 26.0–34.0)
MCHC: 33.8 g/dL (ref 30.0–36.0)
Neutrophils Relative %: 58 % (ref 43–77)
Platelets: 187 10*3/uL (ref 150–400)
RBC: 5.42 MIL/uL (ref 4.22–5.81)
RDW: 13.5 % (ref 11.5–15.5)

## 2012-10-01 MED ORDER — QUETIAPINE FUMARATE ER 400 MG PO TB24: 400.0000 mg | ORAL_TABLET | Freq: Every day | ORAL | Status: DC

## 2012-10-01 NOTE — Patient Instructions (Addendum)
It was great to see you again today! I'm glad you're doing well.  I am sending in a prescription for Seroquel XR $RemoveBef'400mg'KpdzckzOaR$ . I am hopeful that your insurance will cover this instead of the three 150's. You will take one of these per day. It's a slight dose decrease from the 450 total you were getting, so I want to see you back in one month to be sure you're still doing well.  Make an appointment to see your eye doctor.  Let me know if you have any problems or if your insurance won't cover it. If you have any racing thoughts, high or low moods, thoughts of hurting yourself or anyone else, or any other concerns, please call the clinic or go to the emergency room to be seen by a doctor.  I'll see you back in a month.

## 2012-10-01 NOTE — Progress Notes (Signed)
Patient ID: [name], male   DOB: March 22, 1981, 31 y.o.   MRN: [id number]  HPI: [name] is a 31 y.o. male here because he takes Seroquel and his insurance is saying they will not continue to pay for his current regimen.  He takes Seroquel XR $RemoveBef'150mg'VrAPOgCPqq$  3 tabs every night, for a total of $Remove'450mg'CqLIlxG$ . His insurance has sent both him and myself letters saying that the maximum number of pills they will pay for is one per day.  Patient states his bipolar disorder is well controlled, and he feels like he's been doing very well. He's been on this dose for 5 years. He denies any SI/HI. His moods have been good. He denies feeling sad or having excessively high moods. Things have been going well at home. He sleeps well, but with a delayed sleep schedule (5am to 2pm). He does endorse having some possible auditory hallucinations, in which he thinks someone is calling his name, but when he asks those around him if they hear it, they say they do not. The voices do not tell him to do anything, they just say his name. He states this has happened for some time, and that he is used to this happening and that it does not bother him.  He additionally complains of a cough, thinks he has a cold. Has not had any fevers. No shortness of breath or chest pain. He is still smoking without any desire to quit.  He has a baby girl on the way! She is due on February 20. He is excited about this.  ROS: See HPI  PHYSICAL EXAM: BP 131/70  Pulse 119  Temp 98.2 F (36.8 C) (Oral)  Ht $R'5\' 8"'wH$  (1.727 m)  Wt 270 lb (122.471 kg)  BMI 41.05 kg/m2 Gen: NAD, pleasant, obese Heart: RRR Lungs: CTAB Abd: soft, nontender, nondistended Neuro: nonfocal, speech intact Psych: Full range of affect, speech normal in volume and rate, denies SI/HI. Does endorse auditory hallucinations of hearing his name called. Does not appear to be responding to internal stimuli. Normal eye contact.

## 2012-10-04 DIAGNOSIS — J069 Acute upper respiratory infection, unspecified: Secondary | ICD-10-CM | POA: Insufficient documentation

## 2012-10-04 NOTE — Assessment & Plan Note (Addendum)
Not primary complaint today - but patient mentions he has had a cough for about a week. Lungs clear on exam. Patient not ill-appearing. Likely viral URI and will be self-limiting. F/u PRN.

## 2012-10-04 NOTE — Assessment & Plan Note (Addendum)
Pt reports good control of bipolar disorder on seroquel. Endorses some auditory hallucinations which do not bother him and only consist of someone calling his name. No SI or HI. Moods stable, getting good sleep. Appears well kept and of normal mentation today in the office.  Will rx seroquel XR $RemoveBef'400mg'YwTODEXqGO$  one pill daily, which is a slight decrease in his dose but may be covered better by his insurance as it is only one pill. Will see patient back in one month to reassess symptom control since we are decreasing his dose. Given return precautions - see AVS.  Seroquel monitoring: Monitor weight for obesity - down 5 lbs since last visit Annual A1c - didn't get done last time due to mistake in the lab, will obtain today Lipids - checked direct LDL in Oct 2013 (63) and decreased simvastatin from $RemoveBeforeDE'80mg'jwjOuzmokLkycjE$  to $R'40mg'SP$  daily. Eye doctor q6 months - pt to schedule appointment (especially as also mentions he has blurry vision) Thyroid periodically - TSH WNL in Oct 2013 QTc - Checked EKG today in clinic as pt is also taking nortriptyline for headaches (prescribed by his neurologist) and both of these medications can cause QTc prolongation. EKG was WNL.   (note: "indication" on EKG printout says "chest pain", but patient does NOT endorse chest pain. We performed this EKG for routine monitoring of medication side effects).

## 2012-10-15 ENCOUNTER — Ambulatory Visit: Payer: Medicare Other | Admitting: Family Medicine

## 2012-10-15 ENCOUNTER — Telehealth: Payer: Self-pay | Admitting: Family Medicine

## 2012-10-15 NOTE — Telephone Encounter (Signed)
I saw pt's father in clinic today, and he gave me a phone number to call regarding formulary for this pt's Seroquel. I briefly called this number and got an automated messaging system, did not stay on the phone longer than that. It is for a prior authorization for [employer], so that he can continue with the Seroquel. Will route message to [facility] red team and Margaretha Glassing for assistance with prior authorization.  The prior authorization phone number is: [phone].

## 2012-10-18 NOTE — Telephone Encounter (Signed)
Thank you, [name]. Javier Glazier, Gerrit Heck

## 2012-10-18 NOTE — Telephone Encounter (Addendum)
Called pharmacy and was told that patient picked up seroquel on 12/213 with no co pay. Message left with mpther for patient to call me back .

## 2012-10-25 ENCOUNTER — Telehealth: Payer: Self-pay | Admitting: *Deleted

## 2012-10-25 NOTE — Telephone Encounter (Signed)
Patient  calls back and states he was able to get Seraquel on 12/23 but he has received letter from insurance stating he needs PA for the 400 mg dose .  New rules for PA went into effect Jan 1.   Called insurance for form and placed in MD box for completion.

## 2012-10-25 NOTE — Telephone Encounter (Signed)
Since Dr. Ardelia Mems is not available . Placed in Dr. Alen Bleacher box.. Will not need refill for couple of weeks.

## 2012-10-27 NOTE — Telephone Encounter (Signed)
Prior Authorization form for Seroquel XR 400 mg completed by Dr. Wendi Snipes and faxed to [facility].  Lauralyn Primes

## 2012-10-29 NOTE — Telephone Encounter (Signed)
Received notice from [place] that PA is not required. Called pharmacy and was told rx will go through.

## 2012-10-29 NOTE — Telephone Encounter (Signed)
Received notification form insurance that PA not required. Called pharmacy and  Was told $3.50 co pay.

## 2012-11-02 ENCOUNTER — Ambulatory Visit: Payer: Medicare Other | Admitting: Family Medicine

## 2012-11-04 ENCOUNTER — Ambulatory Visit: Payer: Medicare Other | Admitting: Family Medicine

## 2012-12-07 ENCOUNTER — Other Ambulatory Visit: Payer: Self-pay | Admitting: *Deleted

## 2012-12-07 ENCOUNTER — Telehealth: Payer: Self-pay | Admitting: Family Medicine

## 2012-12-07 MED ORDER — OMEPRAZOLE 40 MG PO CPDR: 40.0000 mg | DELAYED_RELEASE_CAPSULE | Freq: Every day | ORAL | Status: DC

## 2012-12-07 NOTE — Telephone Encounter (Signed)
Called pt. Agreed. Will schedule OV. [name], [name]

## 2012-12-07 NOTE — Telephone Encounter (Signed)
To [facility] red team - please call pt and let him know I refilled his omeprazole but he should schedule a clinic visit to follow up on his mood. He no-showed to his appointment in January. Thanks!

## 2012-12-19 ENCOUNTER — Other Ambulatory Visit: Payer: Self-pay | Admitting: Family Medicine

## 2012-12-21 ENCOUNTER — Ambulatory Visit: Payer: Medicare Other | Admitting: Family Medicine

## 2012-12-22 ENCOUNTER — Telehealth: Payer: Self-pay | Admitting: Family Medicine

## 2012-12-22 NOTE — Telephone Encounter (Signed)
How wonderful! Certainly explains the no-show. I will look forward to seeing him in clinic soon.

## 2012-12-22 NOTE — Telephone Encounter (Signed)
Notified pt of your msg-states he will sched an OV but wanted to let you know he became a father to a baby girl today!
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
## 2013-01-14 ENCOUNTER — Ambulatory Visit: Payer: Medicare Other | Admitting: Family Medicine

## 2013-01-24 ENCOUNTER — Ambulatory Visit: Payer: Medicare Other | Admitting: Family Medicine

## 2013-01-25 ENCOUNTER — Telehealth: Payer: Self-pay | Admitting: Family Medicine

## 2013-01-25 ENCOUNTER — Other Ambulatory Visit: Payer: Self-pay | Admitting: Family Medicine

## 2013-01-25 NOTE — Telephone Encounter (Signed)
Will fwd. To PCP for review (appt 02/11/13) .[place]

## 2013-01-25 NOTE — Telephone Encounter (Signed)
I just responded to a refill request about 1 hour ago and sent in #30 of his seroquel to his pharmacy, so it should be there. Can you call and let them know? Thanks!

## 2013-01-25 NOTE — Telephone Encounter (Signed)
Called and informed patient Rx was sent to pharmacy.[place], Kevin Fenton

## 2013-01-25 NOTE — Telephone Encounter (Signed)
Mom is calling asking if enough of the Seroquel can be sent in to last until his appt on 4/25.  He has enough for tonight and that is it.

## 2013-01-31 ENCOUNTER — Ambulatory Visit (INDEPENDENT_AMBULATORY_CARE_PROVIDER_SITE_OTHER): Payer: 59 | Admitting: Nurse Practitioner

## 2013-01-31 ENCOUNTER — Encounter: Payer: Self-pay | Admitting: Nurse Practitioner

## 2013-01-31 VITALS — BP 118/69 | HR 108 | Ht 67.5 in | Wt 270.0 lb

## 2013-01-31 DIAGNOSIS — R51 Headache: Secondary | ICD-10-CM

## 2013-01-31 DIAGNOSIS — R269 Unspecified abnormalities of gait and mobility: Secondary | ICD-10-CM | POA: Insufficient documentation

## 2013-01-31 DIAGNOSIS — G7109 Other specified muscular dystrophies: Secondary | ICD-10-CM

## 2013-01-31 DIAGNOSIS — R519 Headache, unspecified: Secondary | ICD-10-CM | POA: Insufficient documentation

## 2013-01-31 MED ORDER — NORTRIPTYLINE HCL 10 MG PO CAPS: ORAL_CAPSULE | ORAL | Status: DC

## 2013-01-31 NOTE — Patient Instructions (Addendum)
Continue nortriptyline for headaches Refills called the pharmacy Claritin or Allegra for allergies symptoms Followup in one year

## 2013-01-31 NOTE — Progress Notes (Signed)
HPI: Patient returns for followup after last visit with Dr. Jannifer Franklin 01/29/2012. 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 He also has a long history of headaches for which nortriptyline has been beneficial. His headaches usually occur in the frontal and occipital area they're described as dull and achy unassociated with any nausea or vomiting. He also has a history of sleep apnea but he never followed up for the titration study done. He has bipolar disorder, dyslipidemia, history of alcohol abuse and hypertension along with obesity. He is trying to quit smoking  ROS:   eye pain, snoring, ringing in ears, allergies, runny nose, joint pain.  Physical Exam General: well developed, well nourished, seated, in no evident distress Head: head normocephalic and atraumatic. Oropharynx benign Neck: supple with no carotid or supraclavicular bruits Cardiovascular: regular rate and rhythm, no murmurs  Neurologic Exam Mental Status: Awake and fully alert. Oriented to place and time. Speech and language are normal  Cranial Nerves: Pupils equal, briskly reactive to light. Extraocular movements full without nystagmus. Visual fields full to confrontation. Hearing intact and symmetric to finger snap. Facial sensation intact. Face, tongue, palate move normally and symmetrically. Neck flexion and extension normal.  Motor: Normal bulk and tone. Normal strength in all tested extremity muscles. Sensory.: intact to touch and pinprick and vibratory.  Coordination: Rapid alternating movements normal in all extremities. Finger-to-nose and heel-to-shin performed accurately bilaterally. Gait and Station: Arises from chair without difficulty. Stance is normal. Gait demonstrates normal stride length and balance . Able to heel, toe and tandem walk without difficulty.  Reflexes: 1+ and symmetric. Toes downgoing.     ASSESSMENT: History of headache doing well on nortriptyline 30 mg at at bedtime History  of sleep apnea untreated, encouraged to get treatment. Made aware that his morning headaches could be coming from his sleep apnea.     PLAN: Continue nortriptyline for headaches Refills called the pharmacy Claritin or Allegra for allergies symptoms Encouraged to get his sleep apnea treated Followup in one year   Dennie Bible, GNP-BC APRN

## 2013-02-11 ENCOUNTER — Encounter: Payer: Self-pay | Admitting: Family Medicine

## 2013-02-11 ENCOUNTER — Ambulatory Visit (INDEPENDENT_AMBULATORY_CARE_PROVIDER_SITE_OTHER): Payer: Medicare Other | Admitting: Family Medicine

## 2013-02-11 VITALS — BP 122/72 | HR 98 | Temp 98.7°F | Ht 68.0 in | Wt 274.0 lb

## 2013-02-11 DIAGNOSIS — F17200 Nicotine dependence, unspecified, uncomplicated: Secondary | ICD-10-CM

## 2013-02-11 DIAGNOSIS — E785 Hyperlipidemia, unspecified: Secondary | ICD-10-CM

## 2013-02-11 DIAGNOSIS — F319 Bipolar disorder, unspecified: Secondary | ICD-10-CM

## 2013-02-11 MED ORDER — QUETIAPINE FUMARATE ER 400 MG PO TB24: ORAL_TABLET | ORAL | Status: DC

## 2013-02-11 NOTE — Assessment & Plan Note (Signed)
Pt will return for fasting lipids and CMET. Will titrate statin dose as needed based on lipid panel results.

## 2013-02-11 NOTE — Progress Notes (Signed)
Name: [name] Age/Sex: 32 y.o. male  HPI:  Bipolar disorder: feels like he is doing well. His mood has been good. No changes or problems with decreasing the dose (last time we decreased from seroquel xr $RemoveBef'450mg'lFRiFuPMWy$  daily to $Remove'400mg'YcZANiu$  daily due to a problem with his insurance). Sleeps about 7 hours per night. Denies having any racing thoughts, SI/HI, or auditory or visual hallucinations. Would like to continue at his current dose.  Hyperlipidemia: has been out of simvastatin for 2 months. Willing to come back for fasting lipid panel.  ROS: See HPI  [place]: still smoking, thinking about quitting but isn't sure.  PHYSICAL EXAM: BP 122/72  Pulse 98  Temp(Src) 98.7 F (37.1 C) (Oral)  Ht $R'5\' 8"'ZS$  (1.727 m)  Wt 274 lb (124.286 kg)  BMI 41.67 kg/m2 Gen: NAD Heart: RRR Lungs: CTAB Neuro: grossly nonfocal, speech intact Psych: normal mood and affect. Normal speech rate and volume, and normal eye contact. Somewhat disheveled in appearance. Does not appear to be responding to internal stimuli. Normal body language.

## 2013-02-11 NOTE — Assessment & Plan Note (Signed)
Encouraged smoking cessation 

## 2013-02-11 NOTE — Assessment & Plan Note (Signed)
Well controlled on seroquel $RemoveBef'400mg'JzoNYSJkMm$  XR. Up to date on monitoring parameters (had recent CBC, EKG, TSH). Weight is overall stable (up four pounds from last visit). Will continue current management and have pt f/u in 3 months.

## 2013-02-11 NOTE — Patient Instructions (Signed)
It was great to see you again today! Congratulations on the baby girl.  I am glad things are going well. I have refilled your Seroquel. If you have any thoughts of hurting yourself or anyone else, go to the Emergency Room to stay safe.  Come back one morning next week before you've eaten or had anything to drink (water is okay) to get your bloodwork checked. We're looking at your liver, kidneys, and cholesterol. I will call you if your test results are not normal.  Otherwise, I will send you a letter.  If you do not hear from me with in 2 weeks please call our office.     I will see you back in 3 months to follow up on your mood, or sooner if you have any problems.  Be well, Dr. Ardelia Mems

## 2013-02-26 ENCOUNTER — Other Ambulatory Visit: Payer: Self-pay | Admitting: Family Medicine

## 2013-02-28 ENCOUNTER — Telehealth: Payer: Self-pay | Admitting: Family Medicine

## 2013-02-28 NOTE — Telephone Encounter (Signed)
To [facility] red team - please call pt and let him know I have refilled one month's worth of his medicines but he needs to come in for fasting labs. Thanks!

## 2013-02-28 NOTE — Telephone Encounter (Signed)
1.) Home number is not 'working number' 2.) Cell number 'reached [place]' Did not leave message. If pt calls back, please tell him (see message below) Thank you. Javier Glazier, Gerrit Heck

## 2013-03-01 NOTE — Telephone Encounter (Signed)
Pt has appt with you 03-16-13 fyi from .[place]

## 2013-03-16 ENCOUNTER — Ambulatory Visit (INDEPENDENT_AMBULATORY_CARE_PROVIDER_SITE_OTHER): Payer: Medicare Other | Admitting: Family Medicine

## 2013-03-16 ENCOUNTER — Encounter: Payer: Self-pay | Admitting: Family Medicine

## 2013-03-16 VITALS — BP 119/58 | HR 90 | Temp 99.3°F | Ht 68.0 in | Wt 272.0 lb

## 2013-03-16 DIAGNOSIS — F319 Bipolar disorder, unspecified: Secondary | ICD-10-CM

## 2013-03-16 DIAGNOSIS — K219 Gastro-esophageal reflux disease without esophagitis: Secondary | ICD-10-CM

## 2013-03-16 MED ORDER — OMEPRAZOLE 40 MG PO CPDR: DELAYED_RELEASE_CAPSULE | ORAL | Status: DC

## 2013-03-16 NOTE — Progress Notes (Signed)
Patient ID: [name], male   DOB: 01/16/1981, 32 y.o.   MRN: [id number]  Name: [name] Age/Sex: 32 y.o. male  HPI:  Mood: patient presents with his mother today to discuss his mood. His mother thinks his medicine needs to be changed, saying that he has been ill with everyone for the last several months. He takes seroquel XR $RemoveBef'400mg'wVUClmiikF$  nightly, but this was decreased from $RemoveBefore'450mg'LfEPklBoGCxWd$  nightly in December due to insurance problems.  Mother reports the medicine no longer helps him with going to sleep. He takes it around 11 to 12pm, and goes to sleep at 4pm. He wakes up in the afternoon. He previously had been referred to the [facility] clinic but was told that his copay for the visit would be $130 so he could not go. He denies any auditory or visual hallucinations, or suicidal or homicidal ideations.  GERD: requests refill on prilosec. Says his cardiologist started him on it and that he has reflux pain if he doesn't take it. Says he's never had an EGD before.  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  PHYSICAL EXAM: BP 119/58  Pulse 90  Temp(Src) 99.3 F (37.4 C) (Oral)  Ht $R'5\' 8"'BI$  (1.727 m)  Wt 272 lb (123.378 kg)  BMI 41.37 kg/m2 Gen: no acute distress Lungs: normal respiratory effort Neuro: grossly nonfocal, speech intact Psych: affect pleasant. Cooperative with interview. Normal eye contact. Speech normal rate and rhythm, normal volume. Denies SI/HI/AVH. No indications that pt is responding to internal stimuli.

## 2013-03-16 NOTE — Patient Instructions (Signed)
It was great to see you again today!  Call the [facility] at [phone] to schedule an appointment with a psychiatrist. Call today or tomorrow so that you can get the first available appointment. For now, we will continue your seroquel until you see the psychiatrist.  If you have any thoughts of hurting yourself or anyone else, or any hallucinations, go to the Emergency Room to stay safe.  I sent in a refill on your prilosec. Come back one morning to get your bloodwork tested.  Be well, Dr. Ardelia Mems

## 2013-03-17 ENCOUNTER — Ambulatory Visit (INDEPENDENT_AMBULATORY_CARE_PROVIDER_SITE_OTHER): Payer: 59 | Admitting: Psychiatry

## 2013-03-17 ENCOUNTER — Encounter (HOSPITAL_COMMUNITY): Payer: Self-pay | Admitting: Psychiatry

## 2013-03-17 VITALS — BP 127/80 | HR 75 | Ht 68.0 in | Wt 272.6 lb

## 2013-03-17 DIAGNOSIS — F319 Bipolar disorder, unspecified: Secondary | ICD-10-CM

## 2013-03-17 MED ORDER — DIVALPROEX SODIUM ER 250 MG PO TB24: 250.0000 mg | ORAL_TABLET | Freq: Every day | ORAL | Status: DC
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
## 2013-03-21 DIAGNOSIS — K219 Gastro-esophageal reflux disease without esophagitis: Secondary | ICD-10-CM | POA: Insufficient documentation

## 2013-03-21 NOTE — Assessment & Plan Note (Signed)
Pt and his mother report increased moodiness. We had to decrease his Seroquel dose in December 2013. He denies any hallucinations or thoughts of harming himself or others, and seems to be thinking logically at this time, so I do not think acute psychiatric hospitalization is necessary now. I do think that this pt's bipolar disorder now reaches beyond the scope of my practice in Family Medicine, and that it is prudent to have him see a mental health specialist.  I have spoken about the patient with Dr. Gwenlyn Saran, and we together contacted the [facility] to see if they will take his insurance (Medicare). They do take it, so I have given pt the information so he can call and make an appointment. Advised he call as soon as possible as there may be a wait before he can get an appointment. Pt and his mother are amenable to this plan. Advised patient to go to the ER if he has any thoughts of harming anyone else or himself, or has any hallucinations. Pt understood these instructions.

## 2013-03-21 NOTE — Assessment & Plan Note (Signed)
Will refill omeprazole. Pt may at some point benefit from EGD. Will discuss at future visit.

## 2013-04-01 ENCOUNTER — Telehealth: Payer: Self-pay | Admitting: Family Medicine

## 2013-04-01 ENCOUNTER — Other Ambulatory Visit: Payer: Self-pay | Admitting: Family Medicine

## 2013-04-01 NOTE — Telephone Encounter (Signed)
To [facility] red team - please call pt and let him know I have refilled one month's worth of his simvastatin but he needs to make a lab appointment for fasting labs. Thanks!

## 2013-04-04 NOTE — Telephone Encounter (Signed)
Pt notified and lab appt scheduled for Monday 04/11/13.  Lun Muro, Loralyn Freshwater, [place]

## 2013-04-06 ENCOUNTER — Encounter (HOSPITAL_COMMUNITY): Payer: Self-pay | Admitting: Psychiatry

## 2013-04-06 ENCOUNTER — Ambulatory Visit (INDEPENDENT_AMBULATORY_CARE_PROVIDER_SITE_OTHER): Payer: 59 | Admitting: Psychiatry

## 2013-04-06 VITALS — BP 117/87 | HR 89 | Wt 273.0 lb

## 2013-04-06 DIAGNOSIS — F319 Bipolar disorder, unspecified: Secondary | ICD-10-CM

## 2013-04-06 MED ORDER — DIVALPROEX SODIUM ER 250 MG PO TB24: 250.0000 mg | ORAL_TABLET | Freq: Every day | ORAL | Status: DC
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
## 2013-04-11 ENCOUNTER — Ambulatory Visit (INDEPENDENT_AMBULATORY_CARE_PROVIDER_SITE_OTHER): Payer: 59 | Admitting: Psychiatry

## 2013-04-11 ENCOUNTER — Other Ambulatory Visit: Payer: Self-pay

## 2013-04-11 ENCOUNTER — Encounter (HOSPITAL_COMMUNITY): Payer: Self-pay | Admitting: Psychiatry

## 2013-04-11 DIAGNOSIS — F319 Bipolar disorder, unspecified: Secondary | ICD-10-CM

## 2013-04-11 DIAGNOSIS — F3160 Bipolar disorder, current episode mixed, unspecified: Secondary | ICD-10-CM

## 2013-04-11 NOTE — Progress Notes (Signed)
Patient ID: [name], male   DOB: [birth year], 32 y.o.   MRN: [id number] Presenting Problem Chief Complaint: bipolar disorder, primary depression  What are the main stressors in your life right now, how long? Would like to work more, work limited due to disability status, chaotic home life, poor relationship with daughter's mother.  Previous mental health services Have you ever been treated for a mental health problem, when, where, by whom? Yes. Hospitalized at [facility] for drugs and suicide attempt    Are you currently seeing a therapist or counselor, counselor's name? No   Have you ever had a mental health hospitalization? Yes   Have you ever been treated with medication? Yes. Takes seroquel but continues to have problems with anger and interrupted sleep  Have you ever had suicidal thoughts or attempted suicide, when, how? Yes. No current suicidal thoughts.   Risk factors for Suicide Demographic factors:  Male and Low socioeconomic status Current mental status: none Loss factors: Decrease in vocational status Historical factors: Prior suicide attempts Risk Reduction factors: Living with another person, especially a relative Clinical factors:  Depression, anxiety Cognitive features that contribute to risk: none  SUICIDE RISK:  Mild:  Suicidal ideation of limited frequency, intensity, duration, and specificity.  There are no identifiable plans, no associated intent, mild dysphoria and related symptoms, good self-control (both objective and subjective assessment), few other risk factors, and identifiable protective factors, including available and accessible social support.  Medical history Medical treatment and/or problems, explain: No Do you have any issues with chronic pain?  No    Social/family history Have you been married, how many times?    Do you have children?  22 year old son, 78 month year old daughter   Who lives in your current household? Mother, father, son,  ex-girlfriend/son's mother, ex-girlfriend's boyfriend, ex-girlfriend's child.  Military history: No   Religious/spiritual involvement:  What religion/faith base are you? deferred  Family of origin (childhood history)  Where were you born? [place] Where did you grow up? [place]  Describe the atmosphere of the household where you grew up: loving and supportive Do you have siblings, step/half siblings? Yes, one younger brother   Are your parents separated/divorced, when and why? No   Are your parents alive? Yes   Social supports (personal and professional): father, mother  Education How many grades have you completed? high school diploma/GED Did you have any problems in school, what type? No  Medications prescribed for these problems? No   Employment (financial issues) Works at [employer] and some off season work.  Legal history none  Trauma/Abuse history: Have you ever been exposed to any form of abuse, what type? No.  Have you ever been exposed to something traumatic, describe? No   Substance use Pt. Reports that he was admitted to [facility] for substance dependence prescription pills, alcohol, marijuana, and angel dust 6 years ago. Pt. Reports that he drinks an occasional beer.  Mental Status: General Appearance [name]:  Casual Eye Contact:  Good Motor Behavior:  Normal Speech:  Normal Level of Consciousness:  Alert Mood:  Dysphoric Affect:  Appropriate Anxiety Level:  minimal Thought Process:  Coherent Thought Content:  WNL Perception:  Normal Judgment:  Fair Insight:  Present Cognition: wnl  Diagnosis AXIS I Bipolar, mixed  AXIS II No diagnosis  AXIS III Past Medical History  Diagnosis Date  . Hyperlipidemia   . Neuromuscular disorder     Beckers muscular dystrophy  . Obesity   .  Bipolar disorder     On disability  . Sleep apnea     Does not tolerate CPAP  . Headache(784.0)   . Becker's muscular dystrophy     AXIS IV  other psychosocial or environmental problems  AXIS V 51-60 moderate symptoms   Plan: Pt. To return in 2 weeks for further assessment.  _________________________________________ [name], Ph.D., [place], [facility]

## 2013-04-14 ENCOUNTER — Other Ambulatory Visit: Payer: Self-pay

## 2013-04-20 ENCOUNTER — Other Ambulatory Visit: Payer: Self-pay

## 2013-04-22 ENCOUNTER — Other Ambulatory Visit: Payer: Self-pay | Admitting: Family Medicine

## 2013-04-26 ENCOUNTER — Other Ambulatory Visit: Payer: Medicare Other

## 2013-05-02 ENCOUNTER — Ambulatory Visit (HOSPITAL_COMMUNITY): Payer: Self-pay | Admitting: Psychiatry

## 2013-05-04 ENCOUNTER — Other Ambulatory Visit: Payer: Self-pay

## 2013-05-04 ENCOUNTER — Ambulatory Visit (HOSPITAL_COMMUNITY): Payer: Self-pay | Admitting: Psychiatry

## 2013-05-05 ENCOUNTER — Ambulatory Visit (HOSPITAL_COMMUNITY): Payer: Self-pay | Admitting: Psychiatry

## 2013-05-06 ENCOUNTER — Other Ambulatory Visit: Payer: Medicare Other

## 2013-05-06 DIAGNOSIS — E785 Hyperlipidemia, unspecified: Secondary | ICD-10-CM

## 2013-05-06 LAB — COMPREHENSIVE METABOLIC PANEL
Alkaline Phosphatase: 67 U/L (ref 39–117)
Creat: 0.9 mg/dL (ref 0.50–1.35)
Glucose, Bld: 107 mg/dL — ABNORMAL HIGH (ref 70–99)
Sodium: 140 mEq/L (ref 135–145)
Total Bilirubin: 0.5 mg/dL (ref 0.3–1.2)
Total Protein: 6.8 g/dL (ref 6.0–8.3)

## 2013-05-06 LAB — LIPID PANEL
HDL: 28 mg/dL — ABNORMAL LOW (ref >39)
LDL Cholesterol: 183 mg/dL — ABNORMAL HIGH (ref 0–99)
Total CHOL/HDL Ratio: 9.3 Ratio

## 2013-05-06 NOTE — Progress Notes (Signed)
CMP AND FLP DONE TODAY Karoline Fleer 

## 2013-05-11 ENCOUNTER — Encounter (HOSPITAL_COMMUNITY): Payer: Self-pay | Admitting: Psychiatry

## 2013-05-11 ENCOUNTER — Ambulatory Visit (INDEPENDENT_AMBULATORY_CARE_PROVIDER_SITE_OTHER): Payer: 59 | Admitting: Psychiatry

## 2013-05-11 DIAGNOSIS — F319 Bipolar disorder, unspecified: Secondary | ICD-10-CM

## 2013-05-11 NOTE — Progress Notes (Signed)
   THERAPIST PROGRESS NOTE  Session Time: 2:20-3:00  Participation Level: Active  Behavioral Response: CasualAlertEuthymic  Type of Therapy: Individual Therapy  Treatment Goals addressed: emotion regulation, stress management  Interventions: CBT  Summary: DALLAN SCHONBERG is a 33 y.o. male who presents with bipolar disorder.   Suicidal/Homicidal: Nowithout intent/plan  Therapist Response: Pt. Reports improvement in his mood and level of anger and irritability. Pt. Reports that he has been lethargic, little energy to engage in activities which he attributes to his medication.Discussed themes related to dreams for himself and his children and relationship with God.  Introduced breathing and guided meditation exercise to develop emotion regulation skills.  Plan: Return again in 2 weeks.  Diagnosis: Axis I: Bipolar, Depressed    Axis II: No diagnosis    Renford Dills 05/11/2013

## 2013-05-13 ENCOUNTER — Ambulatory Visit (HOSPITAL_COMMUNITY): Payer: Self-pay | Admitting: Psychiatry

## 2013-05-23 ENCOUNTER — Other Ambulatory Visit: Payer: Self-pay | Admitting: Family Medicine

## 2013-05-23 ENCOUNTER — Other Ambulatory Visit (HOSPITAL_COMMUNITY): Payer: Self-pay | Admitting: Psychiatry

## 2013-05-23 DIAGNOSIS — F319 Bipolar disorder, unspecified: Secondary | ICD-10-CM

## 2013-05-25 ENCOUNTER — Telehealth: Payer: Self-pay | Admitting: Family Medicine

## 2013-05-25 MED ORDER — ATORVASTATIN CALCIUM 40 MG PO TABS: 40.0000 mg | ORAL_TABLET | Freq: Every day | ORAL | Status: DC

## 2013-05-25 NOTE — Telephone Encounter (Signed)
Called patient to discuss cholesterol results. LDL still elevated and framingham risk is 11% so patient needs high intensity statin. Will d/c simvastatin and switch to lipitor $RemoveBe'40mg'UoqrofxvW$  daily. Pt agreeable to this plan. Advised he schedule f/u appt in the next 1-2 months.   Also informed him that his psychiatrist needs to be the one refilling his seroquel since he is now under Dr. Marguerite Olea care for his mental health diagnosis.

## 2013-05-30 ENCOUNTER — Other Ambulatory Visit: Payer: Self-pay | Admitting: Family Medicine

## 2013-06-01 ENCOUNTER — Ambulatory Visit (HOSPITAL_COMMUNITY): Payer: Self-pay | Admitting: Psychiatry

## 2013-06-01 ENCOUNTER — Other Ambulatory Visit: Payer: Self-pay | Admitting: Psychiatry

## 2013-06-02 ENCOUNTER — Encounter (HOSPITAL_COMMUNITY): Payer: Self-pay | Admitting: Psychiatry

## 2013-06-02 ENCOUNTER — Ambulatory Visit (INDEPENDENT_AMBULATORY_CARE_PROVIDER_SITE_OTHER): Payer: 59 | Admitting: Psychiatry

## 2013-06-02 VITALS — BP 136/80 | HR 115 | Ht 68.0 in | Wt 273.8 lb

## 2013-06-02 DIAGNOSIS — F319 Bipolar disorder, unspecified: Secondary | ICD-10-CM

## 2013-06-02 MED ORDER — DIVALPROEX SODIUM ER 250 MG PO TB24: ORAL_TABLET | ORAL | Status: DC

## 2013-06-02 MED ORDER — QUETIAPINE FUMARATE ER 400 MG PO TB24: ORAL_TABLET | ORAL | Status: DC
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
## 2013-06-13 ENCOUNTER — Ambulatory Visit (HOSPITAL_COMMUNITY): Payer: Self-pay | Admitting: Psychiatry

## 2013-07-06 ENCOUNTER — Ambulatory Visit: Payer: Self-pay

## 2013-07-19 ENCOUNTER — Ambulatory Visit (INDEPENDENT_AMBULATORY_CARE_PROVIDER_SITE_OTHER): Payer: Medicare Other | Admitting: *Deleted

## 2013-07-19 DIAGNOSIS — Z23 Encounter for immunization: Secondary | ICD-10-CM

## 2013-07-19 NOTE — Progress Notes (Signed)
Pt here today for flu vaccine.  Nolene Ebbs, RN

## 2013-07-23 ENCOUNTER — Other Ambulatory Visit (HOSPITAL_COMMUNITY): Payer: Self-pay | Admitting: Psychiatry

## 2013-07-25 ENCOUNTER — Other Ambulatory Visit (HOSPITAL_COMMUNITY): Payer: Self-pay | Admitting: Psychiatry

## 2013-07-25 NOTE — Telephone Encounter (Signed)
Given 8/14 with 2 refills. Too soon to refill.

## 2013-08-30 ENCOUNTER — Other Ambulatory Visit (HOSPITAL_COMMUNITY): Payer: Self-pay | Admitting: Psychiatry

## 2013-08-30 ENCOUNTER — Other Ambulatory Visit: Payer: Self-pay | Admitting: Family Medicine

## 2013-09-01 ENCOUNTER — Ambulatory Visit (HOSPITAL_COMMUNITY): Payer: Self-pay | Admitting: Psychiatry

## 2013-09-07 ENCOUNTER — Ambulatory Visit (INDEPENDENT_AMBULATORY_CARE_PROVIDER_SITE_OTHER): Payer: Medicare Other | Admitting: Psychiatry

## 2013-09-07 ENCOUNTER — Encounter (HOSPITAL_COMMUNITY): Payer: Self-pay | Admitting: Psychiatry

## 2013-09-07 VITALS — BP 116/64 | HR 92 | Ht 68.0 in | Wt 268.0 lb

## 2013-09-07 DIAGNOSIS — F319 Bipolar disorder, unspecified: Secondary | ICD-10-CM

## 2013-09-07 MED ORDER — DIVALPROEX SODIUM ER 250 MG PO TB24: ORAL_TABLET | ORAL | Status: DC
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
## 2013-10-02 ENCOUNTER — Other Ambulatory Visit (HOSPITAL_COMMUNITY): Payer: Self-pay | Admitting: Psychiatry

## 2013-10-02 ENCOUNTER — Other Ambulatory Visit: Payer: Self-pay | Admitting: Family Medicine

## 2013-10-04 ENCOUNTER — Telehealth: Payer: Self-pay | Admitting: Family Medicine

## 2013-10-04 ENCOUNTER — Other Ambulatory Visit: Payer: Self-pay | Admitting: Family Medicine

## 2013-10-04 NOTE — Telephone Encounter (Signed)
To [facility] red team - please call pt and let him know I have refilled one month's worth of his lipitor but he needs to schedule an appointment to follow up on his medical problems. Thanks!   Leeanne Rio, MD

## 2013-10-05 NOTE — Telephone Encounter (Signed)
Pt notified to schedule an appt.  [place]af [place], Loralyn [place], [place]

## 2013-10-26 ENCOUNTER — Encounter: Payer: Self-pay | Admitting: Family Medicine

## 2013-10-26 ENCOUNTER — Ambulatory Visit (INDEPENDENT_AMBULATORY_CARE_PROVIDER_SITE_OTHER): Payer: Medicare Other | Admitting: Family Medicine

## 2013-10-26 VITALS — BP 134/85 | HR 96 | Temp 98.2°F | Ht 68.0 in | Wt 280.0 lb

## 2013-10-26 DIAGNOSIS — E785 Hyperlipidemia, unspecified: Secondary | ICD-10-CM

## 2013-10-26 DIAGNOSIS — R51 Headache: Secondary | ICD-10-CM

## 2013-10-26 DIAGNOSIS — K219 Gastro-esophageal reflux disease without esophagitis: Secondary | ICD-10-CM

## 2013-10-26 MED ORDER — OMEPRAZOLE 40 MG PO CPDR: DELAYED_RELEASE_CAPSULE | ORAL | Status: DC

## 2013-10-26 MED ORDER — ATORVASTATIN CALCIUM 40 MG PO TABS: ORAL_TABLET | ORAL | Status: DC
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
## 2013-10-26 NOTE — Patient Instructions (Signed)
It was great to see you again today!  For back pain: I think you pulled a muscle and it will continue to get better. Use ice or tylenol if it flares. If it worsens, you can come back to see [place] again.  For headache: get in to get your eyes checked to see if you need glasses. Also call your neurologist to schedule an earlier followup visit than April.  For cholesterol: continue lipitor.  For reflux: continue prilosec.  Follow up with me in 6 months.  Be well, Dr. Ardelia Mems

## 2013-10-30 NOTE — Assessment & Plan Note (Signed)
Continue lipitor, tolerating well.

## 2013-10-30 NOTE — Assessment & Plan Note (Signed)
Well-controlled, continue current regimen 

## 2013-10-30 NOTE — Assessment & Plan Note (Signed)
Worsening headaches recently, longstanding hx of chronic headaches. Since he is already plugged in with neurology who manage his headache regimen, I have asked him to get in sooner with them than April. Also recommended he schedule an appointment to have his eyes checked in the event that these headaches are due to refractive error. Will await neuro recommendations.

## 2013-11-19 ENCOUNTER — Other Ambulatory Visit (HOSPITAL_COMMUNITY): Payer: Self-pay | Admitting: Psychiatry

## 2013-11-21 ENCOUNTER — Other Ambulatory Visit (HOSPITAL_COMMUNITY): Payer: Self-pay | Admitting: Psychiatry

## 2013-11-21 NOTE — Telephone Encounter (Signed)
Meds given for 90 days. Patient has appointment on 12/08/13. He should have meds until his appointment.

## 2013-12-08 ENCOUNTER — Ambulatory Visit (HOSPITAL_COMMUNITY): Payer: Self-pay | Admitting: Psychiatry

## 2013-12-15 ENCOUNTER — Other Ambulatory Visit (HOSPITAL_COMMUNITY): Payer: Self-pay | Admitting: Psychiatry

## 2013-12-15 DIAGNOSIS — F319 Bipolar disorder, unspecified: Secondary | ICD-10-CM

## 2013-12-19 ENCOUNTER — Telehealth (HOSPITAL_COMMUNITY): Payer: Self-pay | Admitting: *Deleted

## 2013-12-19 DIAGNOSIS — F319 Bipolar disorder, unspecified: Secondary | ICD-10-CM

## 2013-12-19 MED ORDER — QUETIAPINE FUMARATE ER 400 MG PO TB24: ORAL_TABLET | ORAL | Status: DC

## 2013-12-19 NOTE — Telephone Encounter (Signed)
Requested refill of Seroquel and call back. CAlled back-left message: Advised pt that prescription refilled and reminded him of appt 3/9 @ 2:30

## 2013-12-26 ENCOUNTER — Ambulatory Visit (INDEPENDENT_AMBULATORY_CARE_PROVIDER_SITE_OTHER): Payer: 59 | Admitting: Psychiatry

## 2013-12-26 ENCOUNTER — Encounter (HOSPITAL_COMMUNITY): Payer: Self-pay | Admitting: Psychiatry

## 2013-12-26 VITALS — BP 136/76 | HR 100 | Ht 68.0 in | Wt 281.0 lb

## 2013-12-26 DIAGNOSIS — F319 Bipolar disorder, unspecified: Secondary | ICD-10-CM

## 2013-12-26 MED ORDER — QUETIAPINE FUMARATE ER 400 MG PO TB24: ORAL_TABLET | ORAL | Status: DC

## 2013-12-26 MED ORDER — DIVALPROEX SODIUM ER 250 MG PO TB24: ORAL_TABLET | ORAL | Status: DC
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
## 2014-01-18 ENCOUNTER — Other Ambulatory Visit: Payer: Self-pay | Admitting: Family Medicine

## 2014-01-19 MED ORDER — OMEPRAZOLE 40 MG PO CPDR: DELAYED_RELEASE_CAPSULE | ORAL | Status: DC

## 2014-01-31 ENCOUNTER — Encounter: Payer: Self-pay | Admitting: Nurse Practitioner

## 2014-01-31 ENCOUNTER — Ambulatory Visit (INDEPENDENT_AMBULATORY_CARE_PROVIDER_SITE_OTHER): Payer: Medicare Other | Admitting: Nurse Practitioner

## 2014-01-31 ENCOUNTER — Encounter (INDEPENDENT_AMBULATORY_CARE_PROVIDER_SITE_OTHER): Payer: Self-pay

## 2014-01-31 VITALS — BP 123/72 | HR 101 | Ht 68.5 in | Wt 286.0 lb

## 2014-01-31 DIAGNOSIS — R51 Headache: Secondary | ICD-10-CM

## 2014-01-31 DIAGNOSIS — G7109 Other specified muscular dystrophies: Secondary | ICD-10-CM

## 2014-01-31 DIAGNOSIS — R269 Unspecified abnormalities of gait and mobility: Secondary | ICD-10-CM

## 2014-01-31 DIAGNOSIS — G4730 Sleep apnea, unspecified: Secondary | ICD-10-CM

## 2014-01-31 MED ORDER — NORTRIPTYLINE HCL 10 MG PO CAPS: ORAL_CAPSULE | ORAL | Status: DC

## 2014-01-31 NOTE — Progress Notes (Signed)
I have read the note, and I agree with the clinical assessment and plan.  Charles K Willis   

## 2014-01-31 NOTE — Patient Instructions (Signed)
Continue Pamelor at current dose Will set up for sleep study Need fasting lipids of Dr.Mcintire's office F/U after sleep study

## 2014-01-31 NOTE — Progress Notes (Signed)
[facility]  PATIENT: [name] DOB: June 22, 1981   REASON FOR VISIT: Followup for headaches   HISTORY OF PRESENT ILLNESS: Mr. [name], 33 year old male returns for followup. He was last seen 4/14/ 2014. He has a long history of headaches for which nortriptyline has been beneficial. He was diagnosed with obstructive sleep apnea in 2010 and never followed up for titration study. He has Bipolar disorder,  dyslipidemia, he is morbidly obese and has gained an additional 16 pounds since last seen .He is asking me for diet pills. He claims he just had his lipids checked at his primary care however there are no labs in the system since July of last year. He continues to smoke. He returns for reevaluation  HISTORY: of Becker's dystrophy unassociated with any muscle weakness. He also has a long history of headaches for which nortriptyline has been beneficial. His headaches usually occur in the frontal and occipital area they're described as dull and achy unassociated with any nausea or vomiting. He also has a history of sleep apnea but he never followed up for the titration study done. He has bipolar disorder, dyslipidemia, history of alcohol abuse and hypertension along with obesity. He is trying to quit smoking  REVIEW OF SYSTEMS: Full 14 system review of systems performed and notable only for those listed, all others are neg:  Constitutional: N/A  Cardiovascular: N/A  Ear/Nose/Throat: N/A  Skin: N/A  Eyes: N/A  Respiratory: N/A  Gastroitestinal: N/A  Hematology/Lymphatic: N/A  Endocrine: N/A Musculoskeletal:N/A  Allergy/Immunology: N/A  Neurological: Headaches Psychiatric: N/A Sleep snoring   ALLERGIES: No Known Allergies  HOME MEDICATIONS: Outpatient Prescriptions Prior to Visit  Medication Sig Dispense Refill  . atorvastatin (LIPITOR) 40 MG tablet TAKE 1 TABLET (40 MG TOTAL) BY MOUTH DAILY.  90 tablet  1  . divalproex (DEPAKOTE ER) 250 MG 24 hr tablet TAKE 1 TABLET  BY MOUTH AT BEDTIME  30 tablet  2  . niacin (NIASPAN) 1000 MG CR tablet       . nortriptyline (PAMELOR) 10 MG capsule 3 po every night  90 capsule  11  . omeprazole (PRILOSEC) 40 MG capsule TAKE 1 CAPSULE (40 MG TOTAL) BY MOUTH DAILY.  90 capsule  1  . QUEtiapine (SEROQUEL XR) 400 MG 24 hr tablet TAKE 1 TABLET (400 MG TOTAL) BY MOUTH AT BEDTIME.  30 tablet  2   No facility-administered medications prior to visit.    PAST MEDICAL HISTORY: Past Medical History  Diagnosis Date  . Hyperlipidemia   . Neuromuscular disorder     Beckers muscular dystrophy  . Obesity   . Bipolar disorder     On disability  . Sleep apnea     Does not tolerate CPAP  . Headache(784.0)   . Becker's muscular dystrophy     PAST SURGICAL HISTORY: History reviewed. No pertinent past surgical history.  FAMILY HISTORY: Family History  Problem Relation Age of Onset  . COPD Mother   . Depression Mother   . Diabetes Mother   . Hyperlipidemia Mother   . Asthma Father   . Arthritis Father   . Diabetes Father   . Heart disease Father   . Hyperlipidemia Father   . Hypertension Father   . Hypertension Brother     SOCIAL HISTORY: History   Social History  . Marital Status: Single    Spouse Name: N/A    Number of Children: 2  . Years of Education: 12   Occupational History  .  Social History Main Topics  . Smoking status: Current Every Day Smoker -- 1.50 packs/day    Types: Cigarettes  . Smokeless tobacco: Never Used  . Alcohol Use: No     Comment: quit 2013  . Drug Use: No  . Sexual Activity: Not on file   Other Topics Concern  . Not on file   Social History Narrative   Patient lives at home with parents, brother and brothers wife.    Patient is single.    Patient has 2 children.    Patient has a high school education.    Patient is unemployed.    Patient is right handed.      PHYSICAL EXAM  Filed Vitals:   01/31/14 1419  BP: 123/72  Pulse: 101  Height: 5' 8.5" (1.74 m)    Weight: 286 lb (129.729 kg)   Body mass index is 42.85 kg/(m^2).  Generalized: Well developed, morbidly obese male in no acute distress  Head: normocephalic and atraumatic,. mallopatti 4 Neck: Supple, no carotid bruits , neck size 20 inches Cardiac: Regular rate rhythm, no murmur  Musculoskeletal: No deformity   Neurological examination   Mentation: Alert oriented to time, place, history taking. Follows all commands speech and language fluent. ESS 4  Cranial nerve II-XII: Pupils were equal round reactive to light extraocular movements were full, visual field were full on confrontational test. Facial sensation and strength were normal. hearing was intact to finger rubbing bilaterally. Uvula tongue midline. head turning and shoulder shrug were normal and symmetric.Tongue protrusion into cheek strength was normal. Motor: normal bulk and tone, full strength in the BUE, BLE, fine finger movements normal, no pronator drift. No focal weakness Coordination: finger-nose-finger, heel-to-shin bilaterally, no dysmetria Reflexes: 1+ upper lower and symmetric, plantar responses were flexor bilaterally. Gait and Station: Rising up from seated position without assistance, normal stance,  moderate stride, good arm swing, smooth turning, able to perform tiptoe, and heel walking without difficulty. Tandem gait is steady  DIAGNOSTIC DATA (LABS, IMAGING, TESTING) - I reviewed patient records, labs, notes, testing and imaging myself where available.  Lab Results  Component Value Date   WBC 10.6* 10/01/2012   HGB 16.4 10/01/2012   HCT 48.5 10/01/2012   MCV 89.5 10/01/2012   PLT 187 10/01/2012    Lab Results  Component Value Date   CHOL 260* 05/06/2013   HDL 28* 05/06/2013   LDLCALC 183* 05/06/2013   LDLDIRECT 63 07/23/2012   TRIG 243* 05/06/2013   CHOLHDL 9.3 05/06/2013    ASSESSMENT AND PLAN  33 y.o. year old male  has a past medical history of Hyperlipidemia;  Obesity; Bipolar disorder; Sleep  apnea; Headache(784.0); and Becker's muscular dystrophy. here to follow up.   Continue Pamelor at current dose Will set up for sleep study Need fasting lipids of Dr.Mcintire's office, called and spoke with Dr. Sherril Cong nurse who says he has cancelled several lab appts. I am concerned about his overall general health as he is morbidly overweight, has hyperlipidemia, has untreated sleep apnea, sedentary lifestyle etc. and he smokes.  F/U after sleep study  Dennie Bible, [facility], [facility], APRN  [facility] [address], [place], [place] [address] [phone]

## 2014-02-01 ENCOUNTER — Telehealth: Payer: Self-pay | Admitting: Neurology

## 2014-02-01 DIAGNOSIS — G4733 Obstructive sleep apnea (adult) (pediatric): Secondary | ICD-10-CM

## 2014-02-01 DIAGNOSIS — R0683 Snoring: Secondary | ICD-10-CM

## 2014-02-01 NOTE — Telephone Encounter (Signed)
. °  Cecille Rubin, NP is referring [name], 33 y.o. male, for an attended sleep study.  Wt: 286 lbs. Ht: 68.5 in. BMI: 42.85  Diagnoses: Obstructive Sleep Apnea Morbid Obesity Headache Hypertension Hyperlipidemia Snoring  Medication List: Current Outpatient Prescriptions  Medication Sig Dispense Refill   atorvastatin (LIPITOR) 40 MG tablet TAKE 1 TABLET (40 MG TOTAL) BY MOUTH DAILY.  90 tablet  1   divalproex (DEPAKOTE ER) 250 MG 24 hr tablet TAKE 1 TABLET BY MOUTH AT BEDTIME  30 tablet  2   HYDROcodone-acetaminophen (NORCO/VICODIN) 5-325 MG per tablet as needed.       niacin (NIASPAN) 1000 MG CR tablet        nortriptyline (PAMELOR) 10 MG capsule 3 po every night  90 capsule  6   omeprazole (PRILOSEC) 40 MG capsule TAKE 1 CAPSULE (40 MG TOTAL) BY MOUTH DAILY.  90 capsule  1   QUEtiapine (SEROQUEL XR) 400 MG 24 hr tablet TAKE 1 TABLET (400 MG TOTAL) BY MOUTH AT BEDTIME.  30 tablet  2   No current facility-administered medications for this visit.    This patient presents to Cecille Rubin, NP in follow up for headache.  Pt reveals that he was diagnosed with osa in 2010 (Location and records of this are unknown), but never returned for titration.  He credits this to an intolerance of CPAP.  He is a morbidly obese patient with a recent weight gain of 16 lbs since his last office visit.  Pt has a neck size of 20 in and Mallompati class IV airway.  He denies excessive daytime sleepiness, endorsing Epworth at 4.  Cecille Rubin, NP would like for the patient to proceed with a reevaluation of sleep apnea due to ongoing complaint of headache.  Insurance:  [facility] Medicare/Medicaid - Prior authorization is not required.

## 2014-02-01 NOTE — Telephone Encounter (Signed)
Obesity, snoring and morning headaches at age 33. SPLIT study ordered. Patient is a possible candidate for weight loss surgery

## 2014-03-28 ENCOUNTER — Ambulatory Visit (HOSPITAL_COMMUNITY): Payer: Self-pay | Admitting: Psychiatry

## 2014-04-04 ENCOUNTER — Ambulatory Visit: Payer: Self-pay | Admitting: Home Health Services

## 2014-04-16 ENCOUNTER — Other Ambulatory Visit (HOSPITAL_COMMUNITY): Payer: Self-pay | Admitting: Psychiatry

## 2014-04-16 DIAGNOSIS — F319 Bipolar disorder, unspecified: Secondary | ICD-10-CM

## 2014-04-17 ENCOUNTER — Other Ambulatory Visit (HOSPITAL_COMMUNITY): Payer: Self-pay | Admitting: Psychiatry

## 2014-04-17 DIAGNOSIS — F319 Bipolar disorder, unspecified: Secondary | ICD-10-CM

## 2014-05-16 ENCOUNTER — Other Ambulatory Visit (HOSPITAL_COMMUNITY): Payer: Self-pay | Admitting: Psychiatry

## 2014-05-23 ENCOUNTER — Ambulatory Visit (INDEPENDENT_AMBULATORY_CARE_PROVIDER_SITE_OTHER): Payer: Medicare Other | Admitting: Family Medicine

## 2014-05-23 ENCOUNTER — Encounter: Payer: Self-pay | Admitting: Family Medicine

## 2014-05-23 VITALS — BP 113/80 | HR 98 | Temp 98.1°F | Ht 68.0 in | Wt 280.7 lb

## 2014-05-23 DIAGNOSIS — J069 Acute upper respiratory infection, unspecified: Secondary | ICD-10-CM

## 2014-05-23 DIAGNOSIS — B9789 Other viral agents as the cause of diseases classified elsewhere: Principal | ICD-10-CM

## 2014-05-23 MED ORDER — BENZONATATE 100 MG PO CAPS: 100.0000 mg | ORAL_CAPSULE | Freq: Two times a day (BID) | ORAL | Status: DC | PRN

## 2014-05-23 MED ORDER — IBUPROFEN 800 MG PO TABS: 800.0000 mg | ORAL_TABLET | Freq: Three times a day (TID) | ORAL | Status: DC | PRN

## 2014-05-23 NOTE — Patient Instructions (Signed)
It was great meet you today!  You most likely have a virus, this should get better all on its own.   Use the tessalon for cough Use the motrin for your headache, I recommend starting nasal saline to help clear your eustachian tubes.  Viral Infections A virus is a type of germ. Viruses can cause:  Minor sore throats.  Aches and pains.  Headaches.  Runny nose.  Rashes.  Watery eyes.  Tiredness.  Coughs.  Loss of appetite.  Feeling sick to your stomach (nausea).  Throwing up (vomiting).  Watery poop (diarrhea). HOME CARE   Only take medicines as told by your doctor.  Drink enough water and fluids to keep your pee (urine) clear or pale yellow. Sports drinks are a good choice.  Get plenty of rest and eat healthy. Soups and broths with crackers or rice are fine. GET HELP RIGHT AWAY IF:   You have a very bad headache.  You have shortness of breath.  You have chest pain or neck pain.  You have an unusual rash.  You cannot stop throwing up.  You have watery poop that does not stop.  You cannot keep fluids down.  You or your child has a temperature by mouth above 102 F (38.9 C), not controlled by medicine.  Your baby is older than 3 months with a rectal temperature of 102 F (38.9 C) or higher.  Your baby is 66 months old or younger with a rectal temperature of 100.4 F (38 C) or higher. MAKE SURE YOU:   Understand these instructions.  Will watch this condition.  Will get help right away if you are not doing well or get worse. Document Released: 09/18/2008 Document Revised: 12/29/2011 Document Reviewed: 02/11/2011 [facility] Patient Information 2015 [place], [place]. This information is not intended to replace advice given to you by your health care provider. Make sure you discuss any questions you have with your health care provider.

## 2014-05-23 NOTE — Assessment & Plan Note (Signed)
Viral URI most likely, coughing headache her most severe symptoms Treat cough with Tessalon Perles, previous narcotic abuser Treat headache with 800 Motrin Discussed and reviewed in detail red flags including dyspnea, fever, chills, and intolerance of by mouth intake. Advised to come back if symptoms not seriously improved or resolved in 4-5 days

## 2014-05-23 NOTE — Progress Notes (Signed)
Patient ID: [name], male   DOB: 22-Jan-1981, 33 y.o.   MRN: [id number]  Kenn File, MD Phone: [phone]  Subjective:  Chief complaint-noted  Pt Here for cough and headache  Patient states that for the last week she's had cough productive of green sputum, frontal throbbing headache, and nasal congestion. He denies any dyspnea, chest pain, or by mouth intolerance. He denies any fevers or sick contacts as well. HEENT has some vague intermittent pains and mild chills as well.  He has decreased his smoking significantly during this time. He states that his cough and headache for the most severe symptoms today.  ROS-  No fever Positive chills No dyspnea No chest pain Normal by mouth intake  Past Medical History Patient Active Problem List   Diagnosis Date Noted  . GERD (gastroesophageal reflux disease) 03/21/2013  . Headache 01/31/2013  . Gait disorder 01/31/2013  . Viral URI with cough 10/04/2012  . Routine adult health maintenance 07/25/2012  . Elevated BP 04/25/2012  . Narcotic abuse 06/05/2011  . OBESITY 10/17/2010  . 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 SLEEP APNEA 01/24/2009  . BIPOLAR DISORDER UNSPECIFIED 05/05/2008  . HYPERLIPIDEMIA 03/30/2008  . ABUSE, ALCOHOL, UNSPECIFIED 04/26/2007  . TOBACCO ABUSE 12/25/2006    Medications- reviewed and updated Current Outpatient Prescriptions  Medication Sig Dispense Refill  . atorvastatin (LIPITOR) 40 MG tablet TAKE 1 TABLET (40 MG TOTAL) BY MOUTH DAILY.  90 tablet  1  . benzonatate (TESSALON) 100 MG capsule Take 1 capsule (100 mg total) by mouth 2 (two) times daily as needed for cough.  20 capsule  0  . divalproex (DEPAKOTE ER) 250 MG 24 hr tablet TAKE 1 TABLET BY MOUTH AT BEDTIME  30 tablet  0  . HYDROcodone-acetaminophen (NORCO/VICODIN) 5-325 MG per tablet as needed.      Marland Kitchen ibuprofen (ADVIL,MOTRIN) 800 MG tablet Take 1 tablet (800 mg total) by mouth every 8 (eight) hours as needed.  30 tablet  0  . niacin (NIASPAN)  1000 MG CR tablet       . nortriptyline (PAMELOR) 10 MG capsule 3 po every night  90 capsule  6  . omeprazole (PRILOSEC) 40 MG capsule TAKE 1 CAPSULE (40 MG TOTAL) BY MOUTH DAILY.  90 capsule  1  . SEROQUEL XR 400 MG 24 hr tablet TAKE 1 TABLET (400 MG TOTAL) BY MOUTH AT BEDTIME.  30 tablet  0   No current facility-administered medications for this visit.    Objective: BP 113/80  Pulse 98  Temp(Src) 98.1 F (36.7 C) (Oral)  Ht $R'5\' 8"'el$  (1.727 m)  Wt 280 lb 11.2 oz (127.325 kg)  BMI 42.69 kg/m2 Gen: NAD, alert, cooperative with exam HEENT: NCAT, swollen turbinates bilaterally, right TM slightly discolored but all landmarks visible. CV: RRR, good S1/S2, no murmur Resp: CTABL, no wheezes, non-labored Ext: No edema, warm Neuro: Alert and oriented, No gross deficits   Assessment/Plan:  Viral URI with cough Viral URI most likely, coughing headache her most severe symptoms Treat cough with Tessalon Perles, previous narcotic abuser Treat headache with 800 Motrin Discussed and reviewed in detail red flags including dyspnea, fever, chills, and intolerance of by mouth intake. Advised to come back if symptoms not seriously improved or resolved in 4-5 days     Meds ordered this encounter  Medications  . benzonatate (TESSALON) 100 MG capsule    Sig: Take 1 capsule (100 mg total) by mouth 2 (two) times daily as needed for cough.    Dispense:  20 capsule    Refill:  0  . ibuprofen (ADVIL,MOTRIN) 800 MG tablet    Sig: Take 1 tablet (800 mg total) by mouth every 8 (eight) hours as needed.    Dispense:  30 tablet    Refill:  0

## 2014-06-19 ENCOUNTER — Encounter (HOSPITAL_COMMUNITY): Payer: Self-pay | Admitting: Psychiatry

## 2014-06-19 ENCOUNTER — Ambulatory Visit (INDEPENDENT_AMBULATORY_CARE_PROVIDER_SITE_OTHER): Payer: 59 | Admitting: Psychiatry

## 2014-06-19 VITALS — BP 140/85 | HR 123 | Ht 68.0 in | Wt 273.8 lb

## 2014-06-19 DIAGNOSIS — F319 Bipolar disorder, unspecified: Secondary | ICD-10-CM

## 2014-06-19 MED ORDER — DIVALPROEX SODIUM ER 250 MG PO TB24: 250.0000 mg | ORAL_TABLET | Freq: Every day | ORAL | Status: DC

## 2014-06-19 MED ORDER — QUETIAPINE FUMARATE ER 400 MG PO TB24: 400.0000 mg | ORAL_TABLET | Freq: Every day | ORAL | Status: DC
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
## 2014-06-20 ENCOUNTER — Other Ambulatory Visit (HOSPITAL_COMMUNITY): Payer: Self-pay | Admitting: Psychiatry

## 2014-06-20 NOTE — Telephone Encounter (Signed)
Refill request for Depakote denied, filled 06/19/14 with 2 refills.

## 2014-07-04 ENCOUNTER — Ambulatory Visit (INDEPENDENT_AMBULATORY_CARE_PROVIDER_SITE_OTHER): Payer: Medicare Other | Admitting: *Deleted

## 2014-07-04 DIAGNOSIS — Z23 Encounter for immunization: Secondary | ICD-10-CM

## 2014-07-15 ENCOUNTER — Other Ambulatory Visit: Payer: Self-pay | Admitting: Family Medicine

## 2014-08-28 ENCOUNTER — Ambulatory Visit (INDEPENDENT_AMBULATORY_CARE_PROVIDER_SITE_OTHER): Payer: Medicare Other | Admitting: Family Medicine

## 2014-08-28 ENCOUNTER — Encounter: Payer: Self-pay | Admitting: Family Medicine

## 2014-08-28 VITALS — BP 125/82 | HR 92 | Temp 98.0°F | Ht 68.0 in | Wt 280.6 lb

## 2014-08-28 DIAGNOSIS — H60392 Other infective otitis externa, left ear: Secondary | ICD-10-CM

## 2014-08-28 DIAGNOSIS — J069 Acute upper respiratory infection, unspecified: Secondary | ICD-10-CM

## 2014-08-28 MED ORDER — CIPROFLOXACIN-DEXAMETHASONE 0.3-0.1 % OT SUSP: 4.0000 [drp] | Freq: Two times a day (BID) | OTIC | Status: DC

## 2014-08-28 MED ORDER — BENZONATATE 100 MG PO CAPS: 200.0000 mg | ORAL_CAPSULE | Freq: Three times a day (TID) | ORAL | Status: DC | PRN

## 2014-08-28 NOTE — Progress Notes (Signed)
  Subjective:     [name] is a 33 y.o. male here for evaluation of a cough. Onset of symptoms was 3 days ago. Symptoms have been unchanged since that time. The cough is barky and dry and is aggravated by nothing. Associated symptoms include: none. Patient does not have a history of asthma. Patient does not have a history of environmental allergens. Patient has not traveled recently. Patient does have a history of smoking (2 ppd x 20 yrs). Patient has not had a previous chest x-ray. Patient has not had a PPD done.  Does endorse sick contacts at work but denies fever, chills, sweats, sinus pressure, productive cough.  Does have some L ear pain as well, described as fullness  The following portions of the patient's history were reviewed and updated as appropriate: allergies, current medications, past family history, past medical history, past social history, past surgical history and problem list.  Review of Systems Pertinent items are noted in HPI.    Objective:     BP 125/82 mmHg  Pulse 92  Temp(Src) 98 F (36.7 C) (Oral)  Ht $R'5\' 8"'zM$  (1.727 m)  Wt 280 lb 9.6 oz (127.279 kg)  BMI 42.67 kg/m2 General appearance: alert, cooperative and appears stated age Ears: L external canal erythema  Throat: lips, mucosa, and tongue normal; teeth and gums normal Lungs: clear to auscultation bilaterally Heart: regular rate and rhythm    Assessment:    Cough  (not yet acute bronchitis but progressing there) Possible otitis externa    Plan:    Explained lack of efficacy of antibiotics in viral disease. Antitussives per medication orders. Avoid exposure to tobacco smoke and fumes. Call if shortness of breath worsens, blood in sputum, change in character of cough, development of fever or chills, inability to maintain nutrition and hydration. Avoid exposure to tobacco smoke and fumes. As well, ear drops for possible otitis externa

## 2014-08-28 NOTE — Patient Instructions (Signed)
  Please take the tessalon pearls as needed Please stop smoking Please use the ear drops  Thanks, DR. Tishia Maestre

## 2014-09-16 ENCOUNTER — Other Ambulatory Visit (HOSPITAL_COMMUNITY): Payer: Self-pay | Admitting: Psychiatry

## 2014-09-17 ENCOUNTER — Other Ambulatory Visit: Payer: Self-pay | Admitting: Psychiatry

## 2014-09-18 ENCOUNTER — Ambulatory Visit (HOSPITAL_COMMUNITY): Payer: Self-pay | Admitting: Psychiatry

## 2014-09-21 ENCOUNTER — Telehealth: Payer: Self-pay | Admitting: Family Medicine

## 2014-09-21 ENCOUNTER — Other Ambulatory Visit (HOSPITAL_COMMUNITY): Payer: Self-pay | Admitting: *Deleted

## 2014-09-21 DIAGNOSIS — F319 Bipolar disorder, unspecified: Secondary | ICD-10-CM

## 2014-09-21 MED ORDER — QUETIAPINE FUMARATE ER 400 MG PO TB24: 400.0000 mg | ORAL_TABLET | Freq: Every day | ORAL | Status: DC

## 2014-09-21 NOTE — Telephone Encounter (Signed)
Mother called office: Needs refill of Seroquel. Out of medicine. Last appt in August. Informed mother patient needs appt. Last appt cancelled by office due to post call day

## 2014-09-21 NOTE — Telephone Encounter (Signed)
pts mom says pt is having trouble getting his ceroquel, wants to know if MD can prescribe it?

## 2014-09-22 NOTE — Telephone Encounter (Signed)
Patient mother informed rx has been sent in by Dr. Adele Schilder and to scheduled an appointment with PCP before further refills.

## 2014-09-22 NOTE — Telephone Encounter (Signed)
Accidentally routed to [facility]. Will route to [employer].

## 2014-09-22 NOTE — Telephone Encounter (Signed)
Covering for Dr Ardelia Mems today. It looks like it was prescribed by psychiatrist Berniece Andreas MD 09/21/14 to [facility] and on rx was written that patient needs to follow up. Please inform patient. Will also send this to Dr Adele Schilder. Hilton Sinclair, MD

## 2014-09-25 ENCOUNTER — Ambulatory Visit (INDEPENDENT_AMBULATORY_CARE_PROVIDER_SITE_OTHER): Payer: 59 | Admitting: Psychiatry

## 2014-09-25 ENCOUNTER — Encounter (HOSPITAL_COMMUNITY): Payer: Self-pay | Admitting: Psychiatry

## 2014-09-25 VITALS — BP 138/74 | HR 85 | Ht 68.5 in | Wt 280.2 lb

## 2014-09-25 DIAGNOSIS — F319 Bipolar disorder, unspecified: Secondary | ICD-10-CM

## 2014-09-25 MED ORDER — DIVALPROEX SODIUM ER 250 MG PO TB24: 250.0000 mg | ORAL_TABLET | Freq: Every day | ORAL | Status: DC

## 2014-09-25 MED ORDER — QUETIAPINE FUMARATE ER 400 MG PO TB24: 400.0000 mg | ORAL_TABLET | Freq: Every day | ORAL | Status: DC
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
## 2014-10-19 ENCOUNTER — Other Ambulatory Visit: Payer: Self-pay | Admitting: Nurse Practitioner

## 2014-12-19 ENCOUNTER — Encounter: Payer: Self-pay | Admitting: *Deleted

## 2014-12-21 ENCOUNTER — Other Ambulatory Visit (HOSPITAL_COMMUNITY): Payer: Self-pay | Admitting: Psychiatry

## 2014-12-21 ENCOUNTER — Telehealth: Payer: Self-pay | Admitting: Cardiology

## 2014-12-21 NOTE — Telephone Encounter (Signed)
Thought pt need to leave a message,just wanted time of tomorrow's appointment.

## 2014-12-22 ENCOUNTER — Other Ambulatory Visit: Payer: Self-pay | Admitting: Family Medicine

## 2014-12-22 ENCOUNTER — Ambulatory Visit (INDEPENDENT_AMBULATORY_CARE_PROVIDER_SITE_OTHER): Payer: Medicare Other | Admitting: Cardiovascular Disease

## 2014-12-22 ENCOUNTER — Other Ambulatory Visit: Payer: Self-pay | Admitting: Neurology

## 2014-12-22 ENCOUNTER — Encounter: Payer: Self-pay | Admitting: Cardiovascular Disease

## 2014-12-22 VITALS — BP 100/70 | HR 105 | Ht 68.5 in | Wt 286.9 lb

## 2014-12-22 DIAGNOSIS — R030 Elevated blood-pressure reading, without diagnosis of hypertension: Secondary | ICD-10-CM | POA: Diagnosis not present

## 2014-12-22 DIAGNOSIS — Z8249 Family history of ischemic heart disease and other diseases of the circulatory system: Secondary | ICD-10-CM | POA: Diagnosis not present

## 2014-12-22 DIAGNOSIS — IMO0001 Reserved for inherently not codable concepts without codable children: Secondary | ICD-10-CM

## 2014-12-22 NOTE — Assessment & Plan Note (Signed)
Continues to smoke 2 packs a day recalcitrant and risk factor modification

## 2014-12-22 NOTE — Progress Notes (Signed)
12/22/2014 [place]   07-17-1981  [id number]  Primary Physician Chrisandra Netters, MD Primary Cardiologist: Lorretta Harp MD Renae Gloss   HPI:  [name] is a 33 year old morbidly overweight married Caucasian male father of 2 young children who now works as a Health visitor. He is a sign of Enzo Bi, one of my patients as well. He has a history of tobacco abuse currently smoking 2 packs a day. Did drink and use illicit drugs in the past which he has discontinued. He denies chest pain or shortness of breath. He had normal echo in 2005 and Myoview stress test 01/22/11.   Current Outpatient Prescriptions  Medication Sig Dispense Refill  . benzonatate (TESSALON) 100 MG capsule Take 2 capsules (200 mg total) by mouth 3 (three) times daily as needed for cough. 60 capsule 1  . divalproex (DEPAKOTE ER) 250 MG 24 hr tablet Take 1 tablet (250 mg total) by mouth at bedtime. 30 tablet 2  . HYDROcodone-acetaminophen (NORCO/VICODIN) 5-325 MG per tablet as needed.    . niacin (NIASPAN) 1000 MG CR tablet     . nortriptyline (PAMELOR) 10 MG capsule TAKE 3 CAPSULES BY MOUTH EVERY NIGHT 90 capsule 1  . omeprazole (PRILOSEC) 40 MG capsule TAKE 1 CAPSULE (40 MG TOTAL) BY MOUTH DAILY. 90 capsule 1  . QUEtiapine (SEROQUEL XR) 400 MG 24 hr tablet Take 1 tablet (400 mg total) by mouth at bedtime. 30 tablet 2   No current facility-administered medications for this visit.    No Known Allergies  History   Social History  . Marital Status: Single    Spouse Name: N/A  . Number of Children: 2  . Years of Education: 12   Occupational History  .     Social History Main Topics  . Smoking status: Current Every Day Smoker -- 1.50 packs/day    Types: Cigarettes  . Smokeless tobacco: Never Used  . Alcohol Use: No     Comment: quit 2013  . Drug Use: No  . Sexual Activity: Not on file   Other Topics Concern  . Not on file   Social History Narrative   Patient lives at home with  parents, brother and brothers wife.    Patient is single.    Patient has 2 children.    Patient has a high school education.    Patient is unemployed.    Patient is right handed.      Review of Systems: General: negative for chills, fever, night sweats or weight changes.  Cardiovascular: negative for chest pain, dyspnea on exertion, edema, orthopnea, palpitations, paroxysmal nocturnal dyspnea or shortness of breath Dermatological: negative for rash Respiratory: negative for cough or wheezing Urologic: negative for hematuria Abdominal: negative for nausea, vomiting, diarrhea, bright red blood per rectum, melena, or hematemesis Neurologic: negative for visual changes, syncope, or dizziness All other systems reviewed and are otherwise negative except as noted above.    Blood pressure 100/70, pulse 105, height 5' 8.5" (1.74 m), weight 286 lb 14.4 oz (130.137 kg).  General appearance: alert and no distress Neck: no adenopathy, no carotid bruit, no JVD, supple, symmetrical, trachea midline and thyroid not enlarged, symmetric, no tenderness/mass/nodules Lungs: clear to auscultation bilaterally Heart: regular rate and rhythm, S1, S2 normal, no murmur, click, rub or gallop Extremities: extremities normal, atraumatic, no cyanosis or edema  EKG sinus tachycardia 105 without ST or T-wave changes. I personally reviewed this EKG  ASSESSMENT AND PLAN:   TOBACCO ABUSE Continues to  smoke 2 packs a day recalcitrant and risk factor modification   HYPERLIPIDEMIA History of hyperlipidemia his last LDL measured 183 on 05/06/13 currently not on statin drug. We will recheck his lipid and liver profile       Lorretta Harp MD [facility], [facility] 12/22/2014 2:14 PM

## 2014-12-22 NOTE — Assessment & Plan Note (Signed)
History of hyperlipidemia his last LDL measured 183 on 05/06/13 currently not on statin drug. We will recheck his lipid and liver profile

## 2014-12-22 NOTE — Patient Instructions (Signed)
Dr Gwenlyn Found has ordered the following test(s) to be done: 1. Blood work - to be done FASTING at your earliest convenience.  Dr Gwenlyn Found wants you to follow-up as needed.

## 2014-12-25 ENCOUNTER — Ambulatory Visit (HOSPITAL_COMMUNITY): Payer: Self-pay | Admitting: Psychiatry

## 2014-12-28 ENCOUNTER — Other Ambulatory Visit (HOSPITAL_COMMUNITY): Payer: Self-pay | Admitting: Psychiatry

## 2014-12-28 DIAGNOSIS — F319 Bipolar disorder, unspecified: Secondary | ICD-10-CM

## 2014-12-28 NOTE — Telephone Encounter (Signed)
Chart reviewed. Refill given for a 30 day supply with no refills. Pt did not show for last appointment on 12/25/14. Note to pharmacy that an appointment must be made for future refills.

## 2015-01-11 ENCOUNTER — Encounter (HOSPITAL_COMMUNITY): Payer: Self-pay | Admitting: Psychiatry

## 2015-01-11 ENCOUNTER — Ambulatory Visit (INDEPENDENT_AMBULATORY_CARE_PROVIDER_SITE_OTHER): Payer: 59 | Admitting: Psychiatry

## 2015-01-11 VITALS — BP 133/81 | HR 95 | Ht 68.5 in | Wt 287.6 lb

## 2015-01-11 DIAGNOSIS — F319 Bipolar disorder, unspecified: Secondary | ICD-10-CM

## 2015-01-11 MED ORDER — QUETIAPINE FUMARATE ER 400 MG PO TB24: 400.0000 mg | ORAL_TABLET | Freq: Every day | ORAL | Status: DC

## 2015-01-11 MED ORDER — DIVALPROEX SODIUM ER 250 MG PO TB24: ORAL_TABLET | ORAL | Status: DC
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
## 2015-01-24 ENCOUNTER — Other Ambulatory Visit: Payer: Self-pay | Admitting: Neurology

## 2015-03-08 ENCOUNTER — Ambulatory Visit: Payer: Self-pay | Admitting: Family Medicine

## 2015-03-21 ENCOUNTER — Encounter: Payer: Self-pay | Admitting: Family Medicine

## 2015-03-21 ENCOUNTER — Ambulatory Visit (INDEPENDENT_AMBULATORY_CARE_PROVIDER_SITE_OTHER): Payer: Medicare Other | Admitting: Family Medicine

## 2015-03-21 VITALS — BP 135/93 | HR 78 | Temp 97.7°F | Ht 68.0 in | Wt 287.0 lb

## 2015-03-21 DIAGNOSIS — Z131 Encounter for screening for diabetes mellitus: Secondary | ICD-10-CM

## 2015-03-21 DIAGNOSIS — E119 Type 2 diabetes mellitus without complications: Secondary | ICD-10-CM | POA: Diagnosis not present

## 2015-03-21 LAB — POCT GLYCOSYLATED HEMOGLOBIN (HGB A1C): HEMOGLOBIN A1C: 6.7

## 2015-03-21 MED ORDER — METFORMIN HCL 500 MG PO TABS: 500.0000 mg | ORAL_TABLET | Freq: Two times a day (BID) | ORAL | Status: DC

## 2015-03-21 NOTE — Progress Notes (Signed)
Patient ID: [name], male   DOB: 04-Oct-1981, 34 y.o.   MRN: [id number]  HPI:  Patient presents to be tested for diabetes. He recently had a visit from an nurse for his insurance company. They performed a urine test and told him he has diabetes. He denies any chest pain or shortness of breath. Has not been to the eye doctor in several years.  ROS: See HPI.  Anna: History of obesity, hyperlipidemia, bipolar disorder  PHYSICAL EXAM: BP 135/93 mmHg  Pulse 78  Temp(Src) 97.7 F (36.5 C) (Oral)  Ht $R'5\' 8"'nq$  (1.727 m)  Wt 287 lb (130.182 kg)  BMI 43.65 kg/m2 Gen: No acute distress, pleasant, cooperative HEENT: Normocephalic, atraumatic Heart: Regular rate and rhythm, no murmur Lungs: Clear to auscultation bilaterally, normal respiratory effort Neuro: Grossly nonfocal, speech normal Ext: No appreciable lower extremity edema bilaterally Diabetic foot exam: 2+ DP pulses bilat, normal monofilament testing bilaterally. No callouses. Redness and mild swelling of medial aspect of L fourth toenail. No drainage.  ASSESSMENT/PLAN:  Type 2 diabetes mellitus without complication Newly diagnosed today with A1c of 6.7. Counseled patient on this diagnosis. Will start metformin 500 mg daily. After 2 weeks he will begin 500 twice a day. Follow-up with me in 3 months for repeat A1c.  Cardiac: check lipids today, consider aspirin in future Renal: check renal function today. Unable to give urine sample today for microalbumin, will do in 3 mos. Eye: refer to ophtho for first diabetic eye exam Foot: foot exam performed today. Slight paronychia of L fourth medial aspect of toenail. Counseled to soak in warm salt water BID x 2 weeks. Return if worsening. Otherwise foot exam normal. Immunizations: defer to future visit.    FOLLOW UP: F/u in 3 mos for DM.  [place]. Ardelia Mems, [place]

## 2015-03-21 NOTE — Progress Notes (Signed)
One of the assigned preceptor.

## 2015-03-21 NOTE — Assessment & Plan Note (Signed)
Newly diagnosed today with A1c of 6.7. Counseled patient on this diagnosis. Will start metformin 500 mg daily. After 2 weeks he will begin 500 twice a day. Follow-up with me in 3 months for repeat A1c.  Cardiac: check lipids today, consider aspirin in future Renal: check renal function today. Unable to give urine sample today for microalbumin, will do in 3 mos. Eye: refer to ophtho for first diabetic eye exam Foot: foot exam performed today. Slight paronychia of L fourth medial aspect of toenail. Counseled to soak in warm salt water BID x 2 weeks. Return if worsening. Otherwise foot exam normal. Immunizations: defer to future visit.

## 2015-03-21 NOTE — Patient Instructions (Signed)
Take metformin $RemoveBefore'500mg'neoTjSCIodhCO$  daily. After 2 weeks increase to $RemoveBef'500mg'rgXGWgbQnm$  twice a day. I sent this in for you.  I am referring you to an eye doctor for your eye exam. You will get a phone call to schedule this appointment.   Work on [employer] and exercise. Read the handout below.  Be well, Dr. Ardelia Mems   Type 2 Diabetes Mellitus Type 2 diabetes mellitus, often simply referred to as type 2 diabetes, is a long-lasting (chronic) disease. In type 2 diabetes, the pancreas does not make enough insulin (a hormone), the cells are less responsive to the insulin that is made (insulin resistance), or both. Normally, insulin moves sugars from food into the tissue cells. The tissue cells use the sugars for energy. The lack of insulin or the lack of normal response to insulin causes excess sugars to build up in the blood instead of going into the tissue cells. As a result, high blood sugar (hyperglycemia) develops. The effect of high sugar (glucose) levels can cause many complications. Type 2 diabetes was also previously called adult-onset diabetes, but it can occur at any age.  RISK FACTORS  A person is predisposed to developing type 2 diabetes if someone in the family has the disease and also has one or more of the following primary risk factors:  Overweight.  An inactive lifestyle.  A history of consistently eating high-calorie foods. Maintaining a normal weight and regular physical activity can reduce the chance of developing type 2 diabetes. SYMPTOMS  A person with type 2 diabetes may not show symptoms initially. The symptoms of type 2 diabetes appear slowly. The symptoms include:  Increased thirst (polydipsia).  Increased urination (polyuria).  Increased urination during the night (nocturia).  Weight loss. This weight loss may be rapid.  Frequent, recurring infections.  Tiredness (fatigue).  Weakness.  Vision changes, such as blurred vision.  Fruity smell to your breath.  Abdominal  pain.  Nausea or vomiting.  Cuts or bruises which are slow to heal.  Tingling or numbness in the hands or feet. DIAGNOSIS Type 2 diabetes is frequently not diagnosed until complications of diabetes are present. Type 2 diabetes is diagnosed when symptoms or complications are present and when blood glucose levels are increased. Your blood glucose level may be checked by one or more of the following blood tests:  A fasting blood glucose test. You will not be allowed to eat for at least 8 hours before a blood sample is taken.  A random blood glucose test. Your blood glucose is checked at any time of the day regardless of when you ate.  A hemoglobin A1c blood glucose test. A hemoglobin A1c test provides information about blood glucose control over the previous 3 months.  An oral glucose tolerance test (OGTT). Your blood glucose is measured after you have not eaten (fasted) for 2 hours and then after you drink a glucose-containing beverage. TREATMENT   You may need to take insulin or diabetes medicine daily to keep blood glucose levels in the desired range.  If you use insulin, you may need to adjust the dosage depending on the carbohydrates that you eat with each meal or snack. The treatment goal is to maintain the before meal blood sugar (preprandial glucose) level at 70-130 mg/dL. HOME CARE INSTRUCTIONS   Have your hemoglobin A1c level checked twice a year.  Perform daily blood glucose monitoring as directed by your health care provider.  Monitor urine ketones when you are ill and as directed by your health  care provider.  Take your diabetes medicine or insulin as directed by your health care provider to maintain your blood glucose levels in the desired range.  Never run out of diabetes medicine or insulin. It is needed every day.  If you are using insulin, you may need to adjust the amount of insulin given based on your intake of carbohydrates. Carbohydrates can raise blood glucose  levels but need to be included in your diet. Carbohydrates provide vitamins, minerals, and fiber which are an essential part of a healthy diet. Carbohydrates are found in fruits, vegetables, whole grains, dairy products, legumes, and foods containing added sugars.  Eat healthy foods. You should make an appointment to see a registered dietitian to help you create an eating plan that is right for you.  Lose weight if you are overweight.  Carry a medical alert card or wear your medical alert jewelry.  Carry a 15-gram carbohydrate snack with you at all times to treat low blood glucose (hypoglycemia). Some examples of 15-gram carbohydrate snacks include:  Glucose tablets, 3 or 4.  Glucose gel, 15-gram tube.  Raisins, 2 tablespoons (24 grams).  Jelly beans, 6.  Animal crackers, 8.  Regular pop, 4 ounces (120 mL).  Gummy treats, 9.  Recognize hypoglycemia. Hypoglycemia occurs with blood glucose levels of 70 mg/dL and below. The risk for hypoglycemia increases when fasting or skipping meals, during or after intense exercise, and during sleep. Hypoglycemia symptoms can include:  Tremors or shakes.  Decreased ability to concentrate.  Sweating.  Increased heart rate.  Headache.  Dry mouth.  Hunger.  Irritability.  Anxiety.  Restless sleep.  Altered speech or coordination.  Confusion.  Treat hypoglycemia promptly. If you are alert and able to safely swallow, follow the 15:15 rule:  Take 15-20 grams of rapid-acting glucose or carbohydrate. Rapid-acting options include glucose gel, glucose tablets, or 4 ounces (120 mL) of fruit juice, regular soda, or low-fat milk.  Check your blood glucose level 15 minutes after taking the glucose.  Take 15-20 grams more of glucose if the repeat blood glucose level is still 70 mg/dL or below.  Eat a meal or snack within 1 hour once blood glucose levels return to normal.  Be alert to feeling very thirsty and urinating more frequently  than usual, which are early signs of hyperglycemia. An early awareness of hyperglycemia allows for prompt treatment. Treat hyperglycemia as directed by your health care provider.  Engage in at least 150 minutes of moderate-intensity physical activity a week, spread over at least 3 days of the week or as directed by your health care provider. In addition, you should engage in resistance exercise at least 2 times a week or as directed by your health care provider. Try to spend no more than 90 minutes at one time inactive.  Adjust your medicine and food intake as needed if you start a new exercise or sport.  Follow your sick-day plan anytime you are unable to eat or drink as usual.  Do not use any tobacco products including cigarettes, chewing tobacco, or electronic cigarettes. If you need help quitting, ask your health care provider.  Limit alcohol intake to no more than 1 drink per day for nonpregnant women and 2 drinks per day for men. You should drink alcohol only when you are also eating food. Talk with your health care provider whether alcohol is safe for you. Tell your health care provider if you drink alcohol several times a week.  Keep all follow-up visits as  directed by your health care provider. This is important.  Schedule an eye exam soon after the diagnosis of type 2 diabetes and then annually.  Perform daily skin and foot care. Examine your skin and feet daily for cuts, bruises, redness, nail problems, bleeding, blisters, or sores. A foot exam by a health care provider should be done annually.  Brush your teeth and gums at least twice a day and floss at least once a day. Follow up with your dentist regularly.  Share your diabetes management plan with your workplace or school.  Stay up-to-date with immunizations. It is recommended that people with diabetes who are over 36 years old get the pneumonia vaccine. In some cases, two separate shots may be given. Ask your health care  provider if your pneumonia vaccination is up-to-date.  Learn to manage stress.  Obtain ongoing diabetes education and support as needed.  Participate in or seek rehabilitation as needed to maintain or improve independence and quality of life. Request a physical or occupational therapy referral if you are having foot or hand numbness, or difficulties with grooming, dressing, eating, or physical activity. SEEK MEDICAL CARE IF:   You are unable to eat food or drink fluids for more than 6 hours.  You have nausea and vomiting for more than 6 hours.  Your blood glucose level is over 240 mg/dL.  There is a change in mental status.  You develop an additional serious illness.  You have diarrhea for more than 6 hours.  You have been sick or have had a fever for a couple of days and are not getting better.  You have pain during any physical activity.  SEEK IMMEDIATE MEDICAL CARE IF:  You have difficulty breathing.  You have moderate to large ketone levels. MAKE SURE YOU:  Understand these instructions.  Will watch your condition.  Will get help right away if you are not doing well or get worse. Document Released: 10/06/2005 Document Revised: 02/20/2014 Document Reviewed: 05/04/2012 [facility] Patient Information 2015 [place], [place]. This information is not intended to replace advice given to you by your health care provider. Make sure you discuss any questions you have with your health care provider.

## 2015-03-22 ENCOUNTER — Telehealth: Payer: Self-pay | Admitting: Family Medicine

## 2015-03-22 NOTE — Telephone Encounter (Signed)
Pt mother is requesting a blood meter to check his sugar each day Please advise

## 2015-03-23 ENCOUNTER — Encounter (HOSPITAL_COMMUNITY): Payer: Self-pay | Admitting: Emergency Medicine

## 2015-03-23 ENCOUNTER — Emergency Department (INDEPENDENT_AMBULATORY_CARE_PROVIDER_SITE_OTHER)
Admission: EM | Admit: 2015-03-23 | Discharge: 2015-03-23 | Disposition: A | Discharge status: Home / Self Care | Payer: Medicare Other | Source: Home / Self Care | Attending: Family Medicine | Admitting: Family Medicine

## 2015-03-23 DIAGNOSIS — M79672 Pain in left foot: Secondary | ICD-10-CM | POA: Diagnosis not present

## 2015-03-23 DIAGNOSIS — L03032 Cellulitis of left toe: Secondary | ICD-10-CM

## 2015-03-23 DIAGNOSIS — E11628 Type 2 diabetes mellitus with other skin complications: Secondary | ICD-10-CM | POA: Diagnosis not present

## 2015-03-23 MED ORDER — LIDOCAINE HCL (PF) 1 % IJ SOLN
INTRAMUSCULAR | Status: AC
  Filled 2015-03-23: qty 5

## 2015-03-23 MED ORDER — CEFTRIAXONE SODIUM 1 G IJ SOLR
1.0000 g | Freq: Once | INTRAMUSCULAR | Status: AC
  Administered 2015-03-23: 1 g via INTRAMUSCULAR

## 2015-03-23 MED ORDER — GLUCOSE BLOOD VI STRP: ORAL_STRIP | Status: DC

## 2015-03-23 MED ORDER — CEFTRIAXONE SODIUM 1 G IJ SOLR
INTRAMUSCULAR | Status: AC
  Filled 2015-03-23: qty 10

## 2015-03-23 MED ORDER — CEPHALEXIN 500 MG PO CAPS: 500.0000 mg | ORAL_CAPSULE | Freq: Four times a day (QID) | ORAL | Status: DC

## 2015-03-23 MED ORDER — ACCU-CHEK FASTCLIX LANCETS MISC: Status: DC

## 2015-03-23 MED ORDER — ACCU-CHEK NANO SMARTVIEW W/DEVICE KIT: PACK | Status: DC

## 2015-03-23 MED ORDER — HYDROCODONE-ACETAMINOPHEN 7.5-325 MG PO TABS: 1.0000 | ORAL_TABLET | ORAL | Status: DC | PRN

## 2015-03-23 NOTE — Telephone Encounter (Signed)
Pt informed. Deseree Blount, CMA  

## 2015-03-23 NOTE — Discharge Instructions (Signed)
Cellulitis Soak in warm salt water at least twice a day Cellulitis is an infection of the skin and the tissue under the skin. The infected area is usually red and tender. This happens most often in the arms and lower legs. HOME CARE   Take your antibiotic medicine as told. Finish the medicine even if you start to feel better.  Keep the infected arm or leg raised (elevated).  Put a warm cloth on the area up to 4 times per day.  Only take medicines as told by your doctor.  Keep all doctor visits as told. GET HELP IF:  You see red streaks on the skin coming from the infected area.  Your red area gets bigger or turns a dark color.  Your bone or joint under the infected area is painful after the skin heals.  Your infection comes back in the same area or different area.  You have a puffy (swollen) bump in the infected area.  You have new symptoms.  You have a fever. GET HELP RIGHT AWAY IF:   You feel very sleepy.  You throw up (vomit) or have watery poop (diarrhea).  You feel sick and have muscle aches and pains. MAKE SURE YOU:   Understand these instructions.  Will watch your condition.  Will get help right away if you are not doing well or get worse. Document Released: 03/24/2008 Document Revised: 02/20/2014 Document Reviewed: 12/22/2011 [facility] Patient Information 2015 [place], [place]. This information is not intended to replace advice given to you by your health care provider. Make sure you discuss any questions you have with your health care provider.

## 2015-03-23 NOTE — Telephone Encounter (Signed)
Rx for meter, strips, and lancets sent to pt's pharmacy. Please inform patient.  Leeanne Rio, MD

## 2015-03-23 NOTE — ED Notes (Signed)
Pt states that he had a hang nail on his toe on the left foot since last Saturday and now his foot and toe are swollen

## 2015-03-23 NOTE — ED Provider Notes (Signed)
CSN: [id number]     Arrival date & time 03/23/15  1545 History   First MD Initiated Contact with Patient 03/23/15 1635     Chief Complaint  Patient presents with  . Toe Pain   (Consider location/radiation/quality/duration/timing/severity/associated sxs/prior Treatment) HPI Comments: 33 year old morbidly obese male recently diagnosed with type 2 diabetes mellitus presents with pain to the left fourth toe and swelling of the foot. He states that there was a hangnail to that toe in which she pulled off approximate one week ago. The following day the toe began swelling and turned red. He now has pain to the forefoot and mild swelling to the proximal foot.  Patient is a 34 y.o. male presenting with toe pain.  Toe Pain    Past Medical History  Diagnosis Date  . Hyperlipidemia   . Neuromuscular disorder     Beckers muscular dystrophy  . Obesity   . Bipolar disorder     On disability  . Sleep apnea     Does not tolerate CPAP  . Headache(784.0)   . Becker's muscular dystrophy    Past Surgical History  Procedure Laterality Date  . Transthoracic echocardiogram  07/05/2004    EF=>55% normal study   . Nm myocar perf wall motion  01/22/2011    protocol: Persantine, moderate reversible inferior defect post stress EF 48%, high risk scan   Family History  Problem Relation Age of Onset  . COPD Mother   . Depression Mother   . Diabetes Mother   . Hyperlipidemia Mother   . Asthma Father   . Arthritis Father   . Diabetes Father   . Heart disease Father   . Hyperlipidemia Father   . Hypertension Father   . Hypertension Brother    History  Substance Use Topics  . Smoking status: Current Every Day Smoker -- 2.00 packs/day    Types: Cigarettes  . Smokeless tobacco: Never Used  . Alcohol Use: No     Comment: quit 2013    Review of Systems  Constitutional: Negative.  Negative for fever.  Respiratory: Negative.   Musculoskeletal: Negative.  Negative for joint swelling.  Skin:  Positive for color change.  Neurological: Negative.     Allergies  Review of patient's allergies indicates no known allergies.  Home Medications   Prior to Admission medications   Medication Sig Start Date End Date Taking? Authorizing Provider  ACCU-CHEK FASTCLIX LANCETS MISC Check blood sugar once daily 03/23/15   Leeanne Rio, MD  Blood Glucose Monitoring Suppl (ACCU-CHEK NANO SMARTVIEW) W/DEVICE KIT Check blood sugar once daily 03/23/15   Leeanne Rio, MD  cephALEXin (KEFLEX) 500 MG capsule Take 1 capsule (500 mg total) by mouth 4 (four) times daily. 03/23/15   Janne Napoleon, NP  divalproex (DEPAKOTE ER) 250 MG 24 hr tablet TAKE 1 TABLET (250 MG TOTAL) BY MOUTH AT BEDTIME. 01/11/15   Kathlee Nations, MD  glucose blood (ACCU-CHEK SMARTVIEW) test strip Check blood sugar once daily 03/23/15   Leeanne Rio, MD  HYDROcodone-acetaminophen (NORCO) 7.5-325 MG per tablet Take 1 tablet by mouth every 4 (four) hours as needed. 03/23/15   Janne Napoleon, NP  metFORMIN (GLUCOPHAGE) 500 MG tablet Take 1 tablet (500 mg total) by mouth 2 (two) times daily with a meal. 03/21/15   Leeanne Rio, MD  niacin (NIASPAN) 1000 MG CR tablet  09/18/12   Historical Provider, MD  nortriptyline (PAMELOR) 10 MG capsule TAKE 3 CAPSULES BY MOUTH EVERY NIGHT 01/24/15  Kathrynn Ducking, MD  omeprazole (PRILOSEC) 40 MG capsule TAKE 1 CAPSULE (40 MG TOTAL) BY MOUTH DAILY. 12/25/14   Leeanne Rio, MD  QUEtiapine (SEROQUEL XR) 400 MG 24 hr tablet Take 1 tablet (400 mg total) by mouth at bedtime. 01/11/15   Kathlee Nations, MD   BP 133/88 mmHg  Pulse 84  Temp(Src) 97.2 F (36.2 C) (Oral)  Resp 18  SpO2 95% Physical Exam  Constitutional: He is oriented to person, place, and time. He appears well-developed and well-nourished. No distress.  Neck: Normal range of motion. Neck supple.  Pulmonary/Chest: Effort normal. No respiratory distress.  Musculoskeletal: Normal range of motion. He exhibits tenderness.  Left  fourth toe with swelling and deep erythema. Erythema does not extend beyond the base of the toe. There is swelling and tenderness to the forefoot. No lymphangitis. No ankle tenderness. Full range of motion of the ankle. No bony tenderness. Distal neurovascular motor sensory is grossly intact within the left lower extremity.  Neurological: He is alert and oriented to person, place, and time. He exhibits normal muscle tone.  Skin: Skin is warm and dry.  Psychiatric: He has a normal mood and affect.  Nursing note and vitals reviewed.   ED Course  Procedures (including critical care time) Labs Review Labs Reviewed - No data to display  Imaging Review No results found.   MDM   1. Cellulitis of fourth toe of left foot   2. Foot pain, left   3. Type 2 diabetes mellitus with other skin complications   No drainage for culture, no fluctuance. Clean left 4 toe with Betadine and a Band-Aid Soak foot and warm salty water at least twice a day for the next 3 days Rocephin 1 g IM now Keflex 500 mg 4 times a day for 10 days Follow-up here PCP in 3 days. If worse or new symptoms or problems may return Symptoms and signs regarding worsening chronic given to the patient and his mother.    Janne Napoleon, NP 03/23/15 1735

## 2015-03-29 ENCOUNTER — Other Ambulatory Visit: Payer: Self-pay | Admitting: Family Medicine

## 2015-04-05 ENCOUNTER — Ambulatory Visit (INDEPENDENT_AMBULATORY_CARE_PROVIDER_SITE_OTHER): Payer: Medicare Other | Admitting: Family Medicine

## 2015-04-05 ENCOUNTER — Encounter: Payer: Self-pay | Admitting: Family Medicine

## 2015-04-05 VITALS — BP 127/65 | HR 93 | Temp 97.9°F | Ht 68.0 in | Wt 280.8 lb

## 2015-04-05 DIAGNOSIS — R51 Headache: Secondary | ICD-10-CM

## 2015-04-05 DIAGNOSIS — R252 Cramp and spasm: Secondary | ICD-10-CM | POA: Diagnosis not present

## 2015-04-05 DIAGNOSIS — E119 Type 2 diabetes mellitus without complications: Secondary | ICD-10-CM | POA: Diagnosis not present

## 2015-04-05 DIAGNOSIS — R519 Headache, unspecified: Secondary | ICD-10-CM

## 2015-04-05 LAB — CBC
HCT: 41.5 % (ref 39.0–52.0)
Hemoglobin: 14.1 g/dL (ref 13.0–17.0)
MCH: 29.5 pg (ref 26.0–34.0)
MCHC: 34 g/dL (ref 30.0–36.0)
MCV: 86.8 fL (ref 78.0–100.0)
MPV: 12.4 fL (ref 8.6–12.4)
PLATELETS: 158 10*3/uL (ref 150–400)
RBC: 4.78 MIL/uL (ref 4.22–5.81)
RDW: 13.9 % (ref 11.5–15.5)
WBC: 6.9 10*3/uL (ref 4.0–10.5)

## 2015-04-05 MED ORDER — NORTRIPTYLINE HCL 10 MG PO CAPS: ORAL_CAPSULE | ORAL | Status: DC

## 2015-04-05 NOTE — Progress Notes (Signed)
Patient ID: [name], male   DOB: Sep 24, 1981, 34 y.o.   MRN: [id number]  HPI:  Body cramping: has noticed cramping of entire body since the weekend. No vomiting. Eating and drinking normally.   Headaches: has been seen by neurology for years for headaches and takes nortriptyline for prophylaxis. Has not been seen by neuro for some time as they wanted him to have a sleep study and he has refused to get this done, and now they will not schedule appt with him until he has study done, according to patient. Is now out of nortriptyline for the last few months and headaches have returned. They are his typical "migraines", in both temples. Hx of sleep apnea reportedly dx'd in the past but not currently using CPAP at all. Pt wary of having another sleep study due to having to wear mask on face.  ROS: See HPI.  [name]: hx bipolar, HLD, recent dx with T2DM  PHYSICAL EXAM: BP 127/65 mmHg  Pulse 93  Temp(Src) 97.9 F (36.6 C) (Oral)  Ht $R'5\' 8"'vX$  (1.727 m)  Wt 280 lb 12.8 oz (127.37 kg)  BMI 42.71 kg/m2 Gen: NAD, pleasant, cooperative HEENT: NCAT, MMM Neuro: grossly nonfocal, speech normal, gait normal, face symmetric Ext: atraumatic  ASSESSMENT/PLAN:  Headache Out of nortriptyline. Typical headaches for him. Will refill x 1 month but advised he needs to get back in with neurology. Encouraged him to schedule sleep study as I also think this is a good test for him. He states he will think about it.  Muscle cramps - unclear etiology. Checking basic labs today since pt is due, will add CK and CBC to further eval. Encouraged to drink plenty of water.  FOLLOW UP: F/u in 2 mos for DM Schedule f/u with neurology  [place] J. Ardelia Mems, [place]

## 2015-04-05 NOTE — Patient Instructions (Signed)
Refilled nortriptyline temporarily until you can get in with neurology Please call and schedule your sleep study - this is a good test for you to have Checking labs today - will call or send letter with results Follow up with me in 2 months, sooner if not better Drink plenty of water  Be well, Dr. Ardelia Mems

## 2015-04-06 ENCOUNTER — Telehealth: Payer: Self-pay | Admitting: Family Medicine

## 2015-04-06 LAB — LIPID PANEL
CHOLESTEROL: 216 mg/dL — AB (ref 0–200)
HDL: 24 mg/dL — ABNORMAL LOW (ref >=40)
LDL Cholesterol: 127 mg/dL — ABNORMAL HIGH (ref 0–99)
Total CHOL/HDL Ratio: 9 Ratio
Triglycerides: 323 mg/dL — ABNORMAL HIGH (ref <150)
VLDL: 65 mg/dL — AB (ref 0–40)

## 2015-04-06 LAB — COMPREHENSIVE METABOLIC PANEL
ALBUMIN: 4 g/dL (ref 3.5–5.2)
ALT: 58 U/L — ABNORMAL HIGH (ref 0–53)
AST: 55 U/L — ABNORMAL HIGH (ref 0–37)
Alkaline Phosphatase: 70 U/L (ref 39–117)
BUN: 12 mg/dL (ref 6–23)
CALCIUM: 8.8 mg/dL (ref 8.4–10.5)
CHLORIDE: 100 meq/L (ref 96–112)
CO2: 28 mEq/L (ref 19–32)
Creat: 0.8 mg/dL (ref 0.50–1.35)
GLUCOSE: 142 mg/dL — AB (ref 70–99)
POTASSIUM: 3.8 meq/L (ref 3.5–5.3)
Sodium: 137 mEq/L (ref 135–145)
Total Bilirubin: 0.3 mg/dL (ref 0.2–1.2)
Total Protein: 7 g/dL (ref 6.0–8.3)

## 2015-04-06 LAB — CK: Total CK: 1402 U/L — ABNORMAL HIGH (ref 7–232)

## 2015-04-06 NOTE — Telephone Encounter (Signed)
Attempted to reach patient about elevated CK level. He did not answer so I left VM asking him to return call as soon as possible.  When he returns call please let him know that his muscle enzyme test was elevated, which likely explains his msucle cramps.  He should STOP taking his niacin and metformin. Drink plenty of water to stay hydrated. Return in 2 weeks for labwork. If he feels worse, vomits, has dark urine, or any other concerns over the weekend should go to ER immediately.  Leeanne Rio, MD

## 2015-04-08 NOTE — Assessment & Plan Note (Signed)
Out of nortriptyline. Typical headaches for him. Will refill x 1 month but advised he needs to get back in with neurology. Encouraged him to schedule sleep study as I also think this is a good test for him. He states he will think about it.

## 2015-04-09 NOTE — Telephone Encounter (Signed)
Attempted again to reach patient. Left another generic voicemail asking him to return the call. Leeanne Rio, MD

## 2015-04-09 NOTE — Telephone Encounter (Signed)
Spoke with patient and relayed message from MD. Patient expressed understanding, will call back if he has any other questions or concerns.

## 2015-04-12 ENCOUNTER — Ambulatory Visit (HOSPITAL_COMMUNITY): Payer: Self-pay | Admitting: Psychiatry

## 2015-04-25 ENCOUNTER — Other Ambulatory Visit (HOSPITAL_COMMUNITY): Payer: Self-pay | Admitting: Psychiatry

## 2015-04-25 ENCOUNTER — Other Ambulatory Visit: Payer: Self-pay | Admitting: Family Medicine

## 2015-04-25 DIAGNOSIS — F319 Bipolar disorder, unspecified: Secondary | ICD-10-CM

## 2015-04-25 MED ORDER — DIVALPROEX SODIUM ER 250 MG PO TB24: ORAL_TABLET | ORAL | Status: DC

## 2015-04-25 NOTE — Telephone Encounter (Signed)
Met with Dr. Taylor who authorized a one time refill of patient's prescribed Seroquel XR 400mg and Depakote 250mg as patient no showed for last appointment and could not be rescheduled until 05/29/15.  Dr. Taylor authorized orders to bridge patient until next appointment.  Both new one time orders e-scribed into [facility]'s [facility] on [address].  

## 2015-05-29 ENCOUNTER — Ambulatory Visit (INDEPENDENT_AMBULATORY_CARE_PROVIDER_SITE_OTHER): Payer: Federal, State, Local not specified - Other | Admitting: Psychiatry

## 2015-05-29 VITALS — BP 132/76 | HR 98 | Ht 68.5 in | Wt 281.6 lb

## 2015-05-29 DIAGNOSIS — F319 Bipolar disorder, unspecified: Secondary | ICD-10-CM | POA: Diagnosis not present

## 2015-05-29 MED ORDER — NORTRIPTYLINE HCL 25 MG PO CAPS: 25.0000 mg | ORAL_CAPSULE | Freq: Every day | ORAL | Status: DC

## 2015-05-29 MED ORDER — QUETIAPINE FUMARATE ER 400 MG PO TB24: 400.0000 mg | ORAL_TABLET | Freq: Every day | ORAL | Status: DC

## 2015-05-30 ENCOUNTER — Encounter (HOSPITAL_COMMUNITY): Payer: Self-pay | Admitting: Psychiatry
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
## 2015-06-26 ENCOUNTER — Other Ambulatory Visit: Payer: Self-pay | Admitting: Family Medicine

## 2015-07-19 ENCOUNTER — Ambulatory Visit (INDEPENDENT_AMBULATORY_CARE_PROVIDER_SITE_OTHER): Payer: Medicare Other | Admitting: *Deleted

## 2015-07-19 DIAGNOSIS — Z23 Encounter for immunization: Secondary | ICD-10-CM

## 2015-08-15 ENCOUNTER — Ambulatory Visit: Payer: Self-pay | Admitting: Internal Medicine

## 2015-08-25 ENCOUNTER — Other Ambulatory Visit (HOSPITAL_COMMUNITY): Payer: Self-pay | Admitting: Psychiatry

## 2015-08-27 ENCOUNTER — Other Ambulatory Visit (HOSPITAL_COMMUNITY): Payer: Self-pay | Admitting: Psychiatry

## 2015-08-29 ENCOUNTER — Other Ambulatory Visit: Payer: Self-pay | Admitting: *Deleted

## 2015-08-29 ENCOUNTER — Ambulatory Visit (HOSPITAL_COMMUNITY): Payer: Self-pay | Admitting: Psychiatry

## 2015-08-29 MED ORDER — OMEPRAZOLE 40 MG PO CPDR: DELAYED_RELEASE_CAPSULE | ORAL | Status: DC

## 2015-09-27 ENCOUNTER — Encounter (HOSPITAL_COMMUNITY): Payer: Self-pay | Admitting: Psychiatry

## 2015-09-27 ENCOUNTER — Ambulatory Visit (INDEPENDENT_AMBULATORY_CARE_PROVIDER_SITE_OTHER): Payer: 59 | Admitting: Psychiatry

## 2015-09-27 VITALS — BP 120/60 | HR 86 | Ht 68.5 in | Wt 275.8 lb

## 2015-09-27 DIAGNOSIS — F319 Bipolar disorder, unspecified: Secondary | ICD-10-CM

## 2015-09-27 MED ORDER — QUETIAPINE FUMARATE ER 400 MG PO TB24: ORAL_TABLET | ORAL | Status: DC

## 2015-09-27 MED ORDER — NORTRIPTYLINE HCL 25 MG PO CAPS: 25.0000 mg | ORAL_CAPSULE | Freq: Every day | ORAL | Status: DC
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
## 2015-12-03 ENCOUNTER — Encounter: Payer: Self-pay | Admitting: Internal Medicine

## 2015-12-03 ENCOUNTER — Ambulatory Visit (INDEPENDENT_AMBULATORY_CARE_PROVIDER_SITE_OTHER): Payer: Medicare Other | Admitting: Internal Medicine

## 2015-12-03 VITALS — BP 142/74 | HR 95 | Temp 98.3°F | Ht 68.0 in | Wt 279.2 lb

## 2015-12-03 DIAGNOSIS — R42 Dizziness and giddiness: Secondary | ICD-10-CM

## 2015-12-03 DIAGNOSIS — R748 Abnormal levels of other serum enzymes: Secondary | ICD-10-CM

## 2015-12-03 DIAGNOSIS — E119 Type 2 diabetes mellitus without complications: Secondary | ICD-10-CM

## 2015-12-03 DIAGNOSIS — E785 Hyperlipidemia, unspecified: Secondary | ICD-10-CM | POA: Diagnosis not present

## 2015-12-03 LAB — POCT GLYCOSYLATED HEMOGLOBIN (HGB A1C): Hemoglobin A1C: 6.9

## 2015-12-03 MED ORDER — GLIPIZIDE 5 MG PO TABS: 5.0000 mg | ORAL_TABLET | Freq: Every day | ORAL | Status: DC

## 2015-12-03 MED ORDER — OMEPRAZOLE 40 MG PO CPDR: DELAYED_RELEASE_CAPSULE | ORAL | Status: DC

## 2015-12-03 NOTE — Patient Instructions (Signed)
[name],  I have started you on glipizide 5 mg to take before your first meal of the day. If you have worsening dizziness, please stop.  Make a lab appointment at your earliest convenience.  Please see me in a month or sooner to continue to discuss dizziness.  I also recommend seeing a counselor to talk about your stressors.  Best, Dr. Ola Spurr

## 2015-12-03 NOTE — Progress Notes (Signed)
Subjective:    Patient ID: [name], male    DOB: Oct 19, 1981, 35 y.o.   MRN: [id number]  HPI Dizziness: [name] is a 35-yo male with history of T2DM, bipolar disorder, Bell's Palsy in 2010, and chronic headaches who presents for concern of increased dizziness. He says this has been going on for years but that he feels it is more intense now. He says he is dizzy all the time and has had about 3 episodes over the last month where he had some double vision with dizziness as well. 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 He denies any recent injuries. He does not typically feel sweaty with these episodes. He has not had nausea or vomiting. He continues to have headaches, which are helped somewhat with nortriptyline. There are not any positions that seem to make symptoms better or worse. He occasionally has ringing in his ears but does not seem to be associated with worsening dizziness.   T2DM: Regarding diabetes, patient was started on metformin 03/2016 but was taken off for severe muscle cramps with elevated CK that developed about 2 weeks later. Hgb A1c today is 6.9, increased from 6.7 when initially diagnosed with diabetes last summer. He says his sugars have ranged from 108-127 when he checks them, which is every other day.  Anxiety/Depression: - Denies SI/HI - Score of 17 on PHQ-9 - Score of 18 on GAD-7 - Followed by psychiatrist Dr. Adele [name] - On Pamelor 25 mg and Seroquel XR 400 mg - Not currently working with a therapist but has in the past. Says he does not like seeing therapists because it brings up a lot of emotions and he does not like to cry. - Stressors include not getting to see his girlfriend often and commotion - Prefers to keep to himself, helps him not get irritated - Has trouble sleeping, going to bed in the early morning and sleeping until afternoon - Considers wrestling a good outlet for  anger and frustration  Review of Systems  Constitutional: Negative for fever and activity change.  HENT: Negative for congestion.   Eyes: Positive for visual disturbance (occasional double vision). Negative for photophobia.  Respiratory: Negative for cough.        Snores and has been diagnosed with sleep apnea but does not wear mask  Gastrointestinal: Negative for nausea, vomiting, abdominal pain and diarrhea.  Neurological: Positive for dizziness and headaches (chronic).       Problems with short term memory  Psychiatric/Behavioral: Positive for agitation. Negative for suicidal ideas and self-injury.   Social: Current smoker. Remote history of cocaine and marijuana use (10 years ago).     Objective: Blood pressure 142/74, pulse 95, temperature 98.3 F (36.8 C), temperature source Oral, height $RemoveBefo'5\' 8"'OmpTvYNjLsi$  (1.727 m), weight 279 lb 3.2 oz (126.644 kg).   Physical Exam  Constitutional: He is oriented to person, place, and time. He appears well-developed.  HENT:  Head: Normocephalic and atraumatic.  Mouth/Throat: Oropharynx is clear and moist.  Eyes: Conjunctivae and EOM are normal. Pupils are equal, round, and reactive to light.  A couple beats of nystagmus bilaterally but only at maximum deviation  Neck: Normal range of motion.  Cardiovascular: Normal rate, regular rhythm and normal heart sounds.   No murmur heard. Pulmonary/Chest: Effort normal and breath sounds normal. No respiratory distress. He has no wheezes.  Abdominal: Soft. Bowel sounds are normal. He  exhibits no distension. There is no tenderness. There is no rebound and no guarding.  Musculoskeletal: Normal range of motion.  Neurological: He is alert and oriented to person, place, and time. No cranial nerve deficit (though some tingling of left side of chin compared to right). Coordination (but with some difficulty walking on tip toes) normal.  Negative Rhomberg. Visual acuity 20/25 on left, 20/30 on right.  Skin: Skin is warm and  dry.  Psychiatric: He has a normal mood and affect. His behavior is normal. Thought content normal.      Assessment & Plan:  [name] is a 35-y/o male with complaint of worsening dizziness. Possible causes include psychiatric complications of bipolar disorder or poorly controled anxiety/depression, medication side effect, and untreated sleep apnea and diabetes. Given normal neurologic exam, patient asked to return in about a month to re-assess symptoms.  Dizzy - Encouraged patient to further discuss impact of anxiety and depression on his daily life and possible need for treatment with medication given his high GAD-7 and PSQ-9 scores. - Encouraged patient to reconnect with therapist to discuss stressors and work on coping mechanisms - Discussed possible contribution of sleep apnea but patient not interested in wearing CPAP.  - Ordered CMP to check for electrolyte abnormalities.   Type 2 diabetes mellitus without complication - Prescribed glipizide 5 mg, given patient's inability to tolerate metformin. Started at very low dose considering today's chief complaint of dizziness, which could be worsened by hypoglycemia. - Counseled patient on hypoglycemia symptoms.   HYPERLIPIDEMIA - Ordered lipid panel.    Victor Floss, MD [facility], PGY-1

## 2015-12-05 ENCOUNTER — Encounter (HOSPITAL_COMMUNITY): Payer: Self-pay | Admitting: *Deleted

## 2015-12-05 ENCOUNTER — Emergency Department (INDEPENDENT_AMBULATORY_CARE_PROVIDER_SITE_OTHER): Payer: Medicare Other

## 2015-12-05 ENCOUNTER — Emergency Department (INDEPENDENT_AMBULATORY_CARE_PROVIDER_SITE_OTHER)
Admission: EM | Admit: 2015-12-05 | Discharge: 2015-12-05 | Disposition: A | Discharge status: Home / Self Care | Payer: Medicare Other | Source: Home / Self Care | Attending: Family Medicine | Admitting: Family Medicine

## 2015-12-05 DIAGNOSIS — M25511 Pain in right shoulder: Secondary | ICD-10-CM | POA: Diagnosis not present

## 2015-12-05 DIAGNOSIS — M7581 Other shoulder lesions, right shoulder: Secondary | ICD-10-CM

## 2015-12-05 MED ORDER — IBUPROFEN 600 MG PO TABS: 600.0000 mg | ORAL_TABLET | Freq: Four times a day (QID) | ORAL | Status: DC | PRN
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
## 2015-12-05 NOTE — ED Provider Notes (Signed)
CSN: [id number]     Arrival date & time 12/05/15  1431 History   First MD Initiated Contact with Patient 12/05/15 1533     Chief Complaint  Patient presents with  . Shoulder Pain   HPI [name] is a 35 y.o. male presenting for right shoulder pain.   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 He reports no pain at that time, though developed gradually aching general right shoulder pain 2 days ago radiating down the arm worse with any ROM. He can't raise it above his head. Denies overhead work, other trauma, history of shoulder dislocation/condition.   Past Medical History  Diagnosis Date  . Hyperlipidemia   . Neuromuscular disorder ([place])     Beckers muscular dystrophy  . Obesity   . Bipolar disorder ([place])     On disability  . Sleep apnea     Does not tolerate CPAP  . Headache(784.0)   . Becker's muscular dystrophy    Past Surgical History  Procedure Laterality Date  . Transthoracic echocardiogram  07/05/2004    EF=>55% normal study   . Nm myocar perf wall motion  01/22/2011    protocol: Persantine, moderate reversible inferior defect post stress EF 48%, high risk scan   Family History  Problem Relation Age of Onset  . COPD Mother   . Depression Mother   . Diabetes Mother   . Hyperlipidemia Mother   . Asthma Father   . Arthritis Father   . Diabetes Father   . Heart disease Father   . Hyperlipidemia Father   . Hypertension Father   . Hypertension Brother    Social History  Substance Use Topics  . Smoking status: Current Every Day Smoker -- 1.00 packs/day for 21 years    Types: Cigarettes  . Smokeless tobacco: Never Used     Comment: Reports using vapor cigarettes currently trying to quit  . Alcohol Use: No     Comment: quit 2013   Review of Systems: As above  Allergies  Review of patient's allergies indicates no known allergies.  Home Medications   Prior to Admission medications   Medication  Sig Start Date End Date Taking? Authorizing Provider  ACCU-CHEK FASTCLIX LANCETS MISC Check blood sugar once daily 03/23/15   Leeanne Rio, MD  Blood Glucose Monitoring Suppl (ACCU-CHEK NANO SMARTVIEW) W/DEVICE KIT Check blood sugar once daily 03/23/15   Leeanne Rio, MD  glipiZIDE (GLUCOTROL) 5 MG tablet Take 1 tablet (5 mg total) by mouth daily before breakfast. 12/03/15   Rogue Bussing, MD  glucose blood [facility]) test strip Check blood sugar once daily 03/23/15   Leeanne Rio, MD  nortriptyline (PAMELOR) 25 MG capsule Take 1 capsule (25 mg total) by mouth at bedtime. 09/27/15   Kathlee Nations, MD  omeprazole (PRILOSEC) 40 MG capsule TAKE 1 CAPSULE (40 MG TOTAL) BY MOUTH DAILY. 12/03/15   Hillary Corinda Gubler, MD  QUEtiapine (SEROQUEL XR) 400 MG 24 hr tablet TAKE 1 TABLET (400 MG TOTAL) BY MOUTH AT BEDTIME. 09/27/15   Kathlee Nations, MD   Meds Ordered and Administered this Visit  Medications - No data to display  BP 125/88 mmHg  Pulse 97  Temp(Src) 98.6 F (37 C) (Oral)  Resp 16  SpO2 96% No data found.   Physical Exam  Constitutional: He is oriented to person, place, and time. He appears well-developed and well-nourished. No distress.  Eyes: EOM are normal. No scleral icterus.  Neck: Normal range of motion. Neck supple.  negative spurling's  Cardiovascular: Normal rate, regular rhythm, normal heart sounds and intact distal pulses.   No murmur heard. Pulmonary/Chest: Effort normal and breath sounds normal. No respiratory distress.  Abdominal: Soft. Bowel sounds are normal. He exhibits no distension. There is no tenderness.  Musculoskeletal:  Right Shoulder: Inspection reveals increased adiposity, no deformities, malalignment, atrophy or asymmetry Tenderness over anterior GH joint, no tenderness over AC joint or bicipital groove Limited abduction, flexion, extension. + empty can test. Can't place hand on back or back of head. Neer and Hawkin's  tests not completed due to pain. No labral pathology noted with negative Obrien's, negative clunk and good stability Neurovascularly intact, good hand, wrist, elbow strength.  Neurological: He is alert and oriented to person, place, and time. He exhibits normal muscle tone.  Skin: Skin is warm and dry.  Vitals reviewed.   ED Course  Procedures (including critical care time)  Labs Review Labs Reviewed - No data to display  Imaging Review No results found.  MDM  No diagnosis found. 35 y.o. right-handed male with right shoulder pain and negative XR's, consistent with rotator cuff tendonitis.  - Sling placed - ROM exercises reviewed to prevent adhesive capsulitis - NSAIDs, ice prn - Refer to PCP for ongoing evaluation. If continues to have problems may benefit from sports medicine referral and/or ultrasound.  This patient's case was discussed with the attending provider who examined the patient and helped formulate the plan of care.   Willamina Grieshop B. Bonner Puna, MD, PGY-3 12/05/2015 4:12 PM   Patrecia Pour, MD 12/05/15 [phone]

## 2015-12-05 NOTE — Discharge Instructions (Signed)
°  Most shoulder pain is caused by soft tissue problems rather than arthritis.  Rotator cuff tendonitis or tendonosis, rotator cuff tears, impingement syndrome and cartilege (labrum tears) are a few of the common causes of shoulder pain.  Fortunately, most of these can be treated with conservative measures as outlined below.  Do not do the following:  Any work with the arms above shoulder level (especially lifting) until the pain has subsided.  Sleep on the affected side.  Especially avoid sleeping with your arm under your head or your pillow.  This is a habit that is hard to break.   Do the following:  Do the shoulder exercises we reviewed twice daily followed by ice for 10 minutes.  If no better in a few weeks, follow up with your primary doctor  Use of over the counter pain meds can be of help. I have sent in ibuprofen. Take this as directed.

## 2015-12-06 ENCOUNTER — Other Ambulatory Visit: Payer: Self-pay

## 2015-12-07 DIAGNOSIS — R42 Dizziness and giddiness: Secondary | ICD-10-CM | POA: Insufficient documentation

## 2015-12-07 NOTE — Assessment & Plan Note (Addendum)
-   Encouraged patient to further discuss impact of anxiety and depression on his daily life and possible need for treatment with medication given his high GAD-7 and PSQ-9 scores. - Encouraged patient to reconnect with therapist to discuss stressors and work on coping mechanisms - Discussed possible contribution of sleep apnea but patient not interested in wearing CPAP.  - Ordered CMP to check for electrolyte abnormalities.

## 2015-12-07 NOTE — Assessment & Plan Note (Signed)
-   Prescribed glipizide 5 mg, given patient's inability to tolerate metformin. Started at very low dose considering today's chief complaint of dizziness, which could be worsened by hypoglycemia. - Counseled patient on hypoglycemia symptoms.

## 2015-12-07 NOTE — Assessment & Plan Note (Signed)
Ordered lipid panel

## 2015-12-10 ENCOUNTER — Other Ambulatory Visit: Payer: Self-pay

## 2015-12-11 ENCOUNTER — Other Ambulatory Visit: Payer: Medicare Other

## 2015-12-11 DIAGNOSIS — E785 Hyperlipidemia, unspecified: Secondary | ICD-10-CM

## 2015-12-11 DIAGNOSIS — R748 Abnormal levels of other serum enzymes: Secondary | ICD-10-CM

## 2015-12-11 NOTE — Progress Notes (Signed)
cmp and flp done today Lennette Fader 

## 2015-12-12 LAB — COMPLETE METABOLIC PANEL WITH GFR
ALBUMIN: 3.8 g/dL (ref 3.6–5.1)
ALT: 45 U/L (ref 9–46)
AST: 31 U/L (ref 10–40)
Alkaline Phosphatase: 80 U/L (ref 40–115)
BILIRUBIN TOTAL: 0.4 mg/dL (ref 0.2–1.2)
BUN: 6 mg/dL — ABNORMAL LOW (ref 7–25)
CO2: 27 mmol/L (ref 20–31)
CREATININE: 0.84 mg/dL (ref 0.60–1.35)
Calcium: 8.5 mg/dL — ABNORMAL LOW (ref 8.6–10.3)
Chloride: 101 mmol/L (ref 98–110)
GLUCOSE: 149 mg/dL — AB (ref 65–99)
Potassium: 3.7 mmol/L (ref 3.5–5.3)
SODIUM: 137 mmol/L (ref 135–146)
TOTAL PROTEIN: 6.9 g/dL (ref 6.1–8.1)

## 2015-12-12 LAB — LIPID PANEL
Cholesterol: 211 mg/dL — ABNORMAL HIGH (ref 125–200)
HDL: 18 mg/dL — ABNORMAL LOW (ref >=40)
LDL CALC: 153 mg/dL — AB (ref <130)
Total CHOL/HDL Ratio: 11.7 Ratio — ABNORMAL HIGH (ref <=5.0)
Triglycerides: 198 mg/dL — ABNORMAL HIGH (ref <150)
VLDL: 40 mg/dL — ABNORMAL HIGH (ref <30)

## 2015-12-26 ENCOUNTER — Encounter (HOSPITAL_COMMUNITY): Payer: Self-pay | Admitting: Psychiatry

## 2015-12-26 ENCOUNTER — Ambulatory Visit (INDEPENDENT_AMBULATORY_CARE_PROVIDER_SITE_OTHER): Payer: 59 | Admitting: Psychiatry

## 2015-12-26 VITALS — BP 135/94 | HR 96 | Ht 68.5 in | Wt 281.0 lb

## 2015-12-26 DIAGNOSIS — F319 Bipolar disorder, unspecified: Secondary | ICD-10-CM | POA: Diagnosis not present

## 2015-12-26 MED ORDER — LAMOTRIGINE 25 MG PO TABS: ORAL_TABLET | ORAL | Status: DC

## 2015-12-26 MED ORDER — QUETIAPINE FUMARATE ER 400 MG PO TB24: ORAL_TABLET | ORAL | Status: DC

## 2015-12-26 MED ORDER — NORTRIPTYLINE HCL 25 MG PO CAPS: 25.0000 mg | ORAL_CAPSULE | Freq: Every day | ORAL | Status: DC
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
## 2016-01-26 ENCOUNTER — Other Ambulatory Visit: Payer: Self-pay | Admitting: Internal Medicine

## 2016-03-22 ENCOUNTER — Other Ambulatory Visit: Payer: Self-pay | Admitting: Internal Medicine

## 2016-03-27 ENCOUNTER — Ambulatory Visit (HOSPITAL_COMMUNITY): Payer: Self-pay | Admitting: Psychiatry

## 2016-03-28 ENCOUNTER — Other Ambulatory Visit: Payer: Self-pay | Admitting: Internal Medicine

## 2016-04-25 DIAGNOSIS — F900 Attention-deficit hyperactivity disorder, predominantly inattentive type: Secondary | ICD-10-CM | POA: Diagnosis not present

## 2016-04-25 DIAGNOSIS — F319 Bipolar disorder, unspecified: Secondary | ICD-10-CM | POA: Diagnosis not present

## 2016-04-28 ENCOUNTER — Encounter: Payer: Self-pay | Admitting: Family Medicine

## 2016-04-28 ENCOUNTER — Ambulatory Visit (INDEPENDENT_AMBULATORY_CARE_PROVIDER_SITE_OTHER): Payer: Medicare Other | Admitting: Family Medicine

## 2016-04-28 VITALS — BP 125/92 | HR 86 | Temp 97.8°F | Ht 68.0 in | Wt 272.6 lb

## 2016-04-28 DIAGNOSIS — J309 Allergic rhinitis, unspecified: Secondary | ICD-10-CM | POA: Diagnosis not present

## 2016-04-28 DIAGNOSIS — F319 Bipolar disorder, unspecified: Secondary | ICD-10-CM | POA: Diagnosis not present

## 2016-04-28 DIAGNOSIS — E119 Type 2 diabetes mellitus without complications: Secondary | ICD-10-CM | POA: Diagnosis not present

## 2016-04-28 LAB — CBC
HCT: 43.7 % (ref 38.5–50.0)
Hemoglobin: 15 g/dL (ref 13.2–17.1)
MCH: 29.4 pg (ref 27.0–33.0)
MCHC: 34.3 g/dL (ref 32.0–36.0)
MCV: 85.7 fL (ref 80.0–100.0)
MPV: 11.9 fL (ref 7.5–12.5)
Platelets: 179 10*3/uL (ref 140–400)
RBC: 5.1 MIL/uL (ref 4.20–5.80)
RDW: 14.5 % (ref 11.0–15.0)
WBC: 7.4 10*3/uL (ref 3.8–10.8)

## 2016-04-28 LAB — POCT GLYCOSYLATED HEMOGLOBIN (HGB A1C): Hemoglobin A1C: 7.3

## 2016-04-28 MED ORDER — GLIPIZIDE 5 MG PO TABS: ORAL_TABLET | ORAL | Status: DC

## 2016-04-28 MED ORDER — FLUTICASONE PROPIONATE 50 MCG/ACT NA SUSP: 2.0000 | Freq: Every day | NASAL | Status: DC

## 2016-04-28 NOTE — Assessment & Plan Note (Signed)
Uncontrolled with A1c of 7.3 Start glipizide 5 mg daily Previously unable to tolerate metformin Foot exam completed today Advised making appointment for eye exam Microalbumin today Follow-up in one month with log of blood glucoses

## 2016-04-28 NOTE — Patient Instructions (Signed)
Nice to meet you today. We are getting some labs and someone will call you or send you a letter with the results when they're available.  Your diabetes is not well controlled currently with an A1c of 7.3. Start glipizide 5 mg daily. Keep a log of your blood sugars first thing in the morning before you eat. Follow-up in one month and bring the log with you.  Start using Flonase as needed for runny and stuffy nose related to allergies. This might help your cough. Quitting smoking will also help your cough.  Take care, Dr. Jacinto Reap

## 2016-04-28 NOTE — Assessment & Plan Note (Signed)
Obtain CBC, CMP, A1c for [facility] today [facility] fax results to them when available - [phone]

## 2016-04-28 NOTE — Progress Notes (Signed)
   Subjective:   [name] is a 35 y.o. male with a history of T2 DM, HLD, substance abuse, bipolar disorder here for diabetes follow-up and cough  T2DM - Checking BG at home: yes - 1x/mo - 120-140 fasting - Medications: glipizide 5mg  daily  - Compliance: has been off of it for 3 months - lost medication - only took it twice - Diet: "junk food" - Exercise: professional wrestling referee 3d/wk - eye exam: needs appt - foot exam: needs - microalbumin: needs - denies symptoms of hypoglycemia, polyuria, polydipsia  Cough - started >1 yr ago - intermittent cough - dry - has allergy problems - worse at beginning of fall and spring - rhinorrhea, postnasal drip - no fevers, SOB  Review of Systems:  Per HPI.   Social History: current smoker  Objective:  BP 125/92 mmHg  Pulse 86  Temp(Src) 97.8 F (36.6 C) (Oral)  Ht 5\' 8"  (1.727 m)  Wt 272 lb 9.6 oz (123.651 kg)  BMI 41.46 kg/m2  Gen:  35 y.o. male in NAD, obese HEENT: NCAT, MMM, EOMI, PERRL, anicteric sclerae CV: RRR, no MRG Resp: Non-labored, CTAB, no wheezes noted Ext: WWP, no edema MSK: No obvious deformities, gait intact Neuro: Alert and oriented, speech normal      Chemistry      Component Value Date/Time   NA 137 12/11/2015 1423   K 3.7 12/11/2015 1423   CL 101 12/11/2015 1423   CO2 27 12/11/2015 1423   BUN 6* 12/11/2015 1423   CREATININE 0.84 12/11/2015 1423   CREATININE 1.1 01/13/2011 0025      Component Value Date/Time   CALCIUM 8.5* 12/11/2015 1423   ALKPHOS 80 12/11/2015 1423   AST 31 12/11/2015 1423   ALT 45 12/11/2015 1423   BILITOT 0.4 12/11/2015 1423      Lab Results  Component Value Date   WBC 6.9 04/05/2015   HGB 14.1 04/05/2015   HCT 41.5 04/05/2015   MCV 86.8 04/05/2015   PLT 158 04/05/2015   Lab Results  Component Value Date   TSH 1.800 07/23/2012   Lab Results  Component Value Date   HGBA1C 7.3 04/28/2016   Assessment & Plan:     [name] is a 35 y.o. male here for     Allergic rhinitis Symptoms consistent with allergic rhinitis Advised quitting smoking Start Flonase when necessary  Type 2 diabetes mellitus without complication Uncontrolled with A1c of 7.3 Start glipizide 5 mg daily Previously unable to tolerate metformin Foot exam completed today Advised making appointment for eye exam Microalbumin today Follow-up in one month with log of blood glucoses  BIPOLAR DISORDER UNSPECIFIED Obtain CBC, CMP, A1c for [facility] today We'll fax results to them when available - [phone]     Virginia Crews, MD MPH PGY-3,  [facility] 04/28/2016  2:58 PM

## 2016-04-28 NOTE — Assessment & Plan Note (Signed)
Symptoms consistent with allergic rhinitis Advised quitting smoking Start Flonase when necessary

## 2016-04-29 ENCOUNTER — Encounter: Payer: Self-pay | Admitting: Family Medicine

## 2016-04-29 LAB — COMPLETE METABOLIC PANEL WITH GFR
ALBUMIN: 4.3 g/dL (ref 3.6–5.1)
ALT: 42 U/L (ref 9–46)
AST: 31 U/L (ref 10–40)
Alkaline Phosphatase: 86 U/L (ref 40–115)
BILIRUBIN TOTAL: 0.4 mg/dL (ref 0.2–1.2)
BUN: 9 mg/dL (ref 7–25)
CALCIUM: 9.3 mg/dL (ref 8.6–10.3)
CO2: 24 mmol/L (ref 20–31)
CREATININE: 0.98 mg/dL (ref 0.60–1.35)
Chloride: 103 mmol/L (ref 98–110)
GFR, Est Non African American: >89 mL/min (ref >=60)
Glucose, Bld: 117 mg/dL — ABNORMAL HIGH (ref 65–99)
Potassium: 4.4 mmol/L (ref 3.5–5.3)
Sodium: 138 mmol/L (ref 135–146)
TOTAL PROTEIN: 7.4 g/dL (ref 6.1–8.1)

## 2016-04-29 LAB — MICROALBUMIN / CREATININE URINE RATIO
Creatinine, Urine: 474 mg/dL — ABNORMAL HIGH (ref 20–370)
Microalb Creat Ratio: 3 mcg/mg creat (ref <30)
Microalb, Ur: 1.4 mg/dL

## 2016-05-28 ENCOUNTER — Other Ambulatory Visit (HOSPITAL_COMMUNITY): Payer: Self-pay | Admitting: Psychiatry

## 2016-05-28 DIAGNOSIS — F319 Bipolar disorder, unspecified: Secondary | ICD-10-CM

## 2016-06-24 ENCOUNTER — Ambulatory Visit (INDEPENDENT_AMBULATORY_CARE_PROVIDER_SITE_OTHER): Payer: Medicare Other | Admitting: *Deleted

## 2016-06-24 DIAGNOSIS — Z23 Encounter for immunization: Secondary | ICD-10-CM | POA: Diagnosis not present

## 2016-07-08 ENCOUNTER — Encounter: Payer: Self-pay | Admitting: Internal Medicine

## 2016-07-08 ENCOUNTER — Ambulatory Visit (INDEPENDENT_AMBULATORY_CARE_PROVIDER_SITE_OTHER): Payer: Medicare Other | Admitting: Internal Medicine

## 2016-07-08 VITALS — BP 124/73 | HR 77 | Temp 98.7°F | Ht 68.0 in | Wt 258.0 lb

## 2016-07-08 DIAGNOSIS — Z23 Encounter for immunization: Secondary | ICD-10-CM | POA: Diagnosis not present

## 2016-07-08 DIAGNOSIS — E119 Type 2 diabetes mellitus without complications: Secondary | ICD-10-CM

## 2016-07-08 DIAGNOSIS — K219 Gastro-esophageal reflux disease without esophagitis: Secondary | ICD-10-CM

## 2016-07-08 DIAGNOSIS — F319 Bipolar disorder, unspecified: Secondary | ICD-10-CM | POA: Diagnosis not present

## 2016-07-08 MED ORDER — GLIPIZIDE 5 MG PO TABS: ORAL_TABLET | ORAL | 6 refills | Status: DC

## 2016-07-08 MED ORDER — OMEPRAZOLE 40 MG PO CPDR: DELAYED_RELEASE_CAPSULE | ORAL | 2 refills | Status: DC

## 2016-07-08 NOTE — Patient Instructions (Addendum)
[name],  Thank you for coming today. I am glad you are doing so well.  Congratulations on your weight loss.  Please continue taking glipizide daily. Ultimately, I hope you will not need this medication in the future as you continue to lose weight and make changes like cutting back on sugary foods.  Please follow-up later this year. We will recheck you blood sugars then.  Best, Dr. Ola Spurr  Diabetes Mellitus and Food It is important for you to manage your blood sugar (glucose) level. Your blood glucose level can be greatly affected by what you eat. Eating healthier foods in the appropriate amounts throughout the day at about the same time each day will help you control your blood glucose level. It can also help slow or prevent worsening of your diabetes mellitus. Healthy eating may even help you improve the level of your blood pressure and reach or maintain a healthy weight.  General recommendations for healthful eating and cooking habits include:  Eating meals and snacks regularly. Avoid going long periods of time without eating to lose weight.  Eating a diet that consists mainly of plant-based foods, such as fruits, vegetables, nuts, legumes, and whole grains.  Using low-heat cooking methods, such as baking, instead of high-heat cooking methods, such as deep frying. Work with your dietitian to make sure you understand how to use the Nutrition Facts information on food labels. HOW CAN FOOD AFFECT ME? Carbohydrates Carbohydrates affect your blood glucose level more than any other type of food. Your dietitian will help you determine how many carbohydrates to eat at each meal and teach you how to count carbohydrates. Counting carbohydrates is important to keep your blood glucose at a healthy level, especially if you are using insulin or taking certain medicines for diabetes mellitus. Alcohol Alcohol can cause sudden decreases in blood glucose (hypoglycemia), especially if you use insulin  or take certain medicines for diabetes mellitus. Hypoglycemia can be a life-threatening condition. Symptoms of hypoglycemia (sleepiness, dizziness, and disorientation) are similar to symptoms of having too much alcohol.  If your health care provider has given you approval to drink alcohol, do so in moderation and use the following guidelines:  Women should not have more than one drink per day, and men should not have more than two drinks per day. One drink is equal to:  12 oz of beer.  5 oz of wine.  1 oz of hard liquor.  Do not drink on an empty stomach.  Keep yourself hydrated. Have water, diet soda, or unsweetened iced tea.  Regular soda, juice, and other mixers might contain a lot of carbohydrates and should be counted. WHAT FOODS ARE NOT RECOMMENDED? As you make food choices, it is important to remember that all foods are not the same. Some foods have fewer nutrients per serving than other foods, even though they might have the same number of calories or carbohydrates. It is difficult to get your body what it needs when you eat foods with fewer nutrients. Examples of foods that you should avoid that are high in calories and carbohydrates but low in nutrients include:  Trans fats (most processed foods list trans fats on the Nutrition Facts label).  Regular soda.  Juice.  Candy.  Sweets, such as cake, pie, doughnuts, and cookies.  Fried foods. WHAT FOODS CAN I EAT? Eat nutrient-rich foods, which will nourish your body and keep you healthy. The food you should eat also will depend on several factors, including:  The calories you need.  The medicines you take.  Your weight.  Your blood glucose level.  Your blood pressure level.  Your cholesterol level. You should eat a variety of foods, including:  Protein.  Lean cuts of meat.  Proteins low in saturated fats, such as fish, egg whites, and beans. Avoid processed meats.  Fruits and vegetables.  Fruits and  vegetables that may help control blood glucose levels, such as apples, mangoes, and yams.  Dairy products.  Choose fat-free or low-fat dairy products, such as milk, yogurt, and cheese.  Grains, bread, pasta, and rice.  Choose whole grain products, such as multigrain bread, whole oats, and brown rice. These foods may help control blood pressure.  Fats.  Foods containing healthful fats, such as nuts, avocado, olive oil, canola oil, and fish. DOES EVERYONE WITH DIABETES MELLITUS HAVE THE SAME MEAL PLAN? Because every person with diabetes mellitus is different, there is not one meal plan that works for everyone. It is very important that you meet with a dietitian who will help you create a meal plan that is just right for you.   This information is not intended to replace advice given to you by your health care provider. Make sure you discuss any questions you have with your health care provider.   Document Released: 07/03/2005 Document Revised: 10/27/2014 Document Reviewed: 09/02/2013 Elsevier Interactive Patient Education [employer].

## 2016-07-09 NOTE — Progress Notes (Signed)
[name] Progress Note  Subjective:  [name] is a 35-y/o male with Bipolar Disorder, T2DM, and obesity who presents for diabetes follow-up.   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 Adele Schilder  - Says he has felt much better since starting latuda  Social: Current smoker, trying to cut back  Objective: Blood pressure 124/73, pulse 77, temperature 98.7 F (37.1 C), temperature source Oral, height $RemoveBefo'5\' 8"'oZoYadIzuDt$  (1.727 m), weight 258 lb (117 kg). Body mass index is 39.23 kg/m. Constitutional: Obese male, in NAD Cardiovascular: RRR, S1, S2, no m/r/g.  Pulmonary/Chest: Effort normal and breath sounds normal. No respiratory distress.  Abdominal: Soft. +BS, NT, ND, no rebound or guarding.  Vitals reviewed  LDL 12/11/15 was 153  Last hgb A1c values: 7.3 on 04/28/16 and 6.9 on 12/03/15  Wt. 258 today and was 272 at last visit 04/28/16 and 281 on 12/26/15  Assessment/Plan: Type 2 diabetes mellitus without complication - Last hgb S4H > 7 - Advised patient to resume daily glipizide 5 mg - Encouraged patient to continue to pursue weight loss - Would benefit from statin since LDL > 70 - Placed referral for ophthalmology exam  BIPOLAR DISORDER UNSPECIFIED - Pt feels he is doing well with addition of latuda  Follow-up later this year for diabetes and weight loss.  Olene Floss, MD [place], PGY-2

## 2016-07-10 NOTE — Assessment & Plan Note (Signed)
-   Pt feels he is doing well with addition of latuda

## 2016-07-10 NOTE — Assessment & Plan Note (Signed)
-   Last hgb A1c > 7 - Advised patient to resume daily glipizide 5 mg - Encouraged patient to continue to pursue weight loss - Would benefit from statin since LDL > 70 - Placed referral for ophthalmology exam

## 2016-08-14 DIAGNOSIS — E119 Type 2 diabetes mellitus without complications: Secondary | ICD-10-CM | POA: Diagnosis not present

## 2016-10-08 ENCOUNTER — Encounter: Payer: Self-pay | Admitting: Family Medicine

## 2016-10-08 ENCOUNTER — Ambulatory Visit (INDEPENDENT_AMBULATORY_CARE_PROVIDER_SITE_OTHER): Payer: Medicare Other | Admitting: Family Medicine

## 2016-10-08 VITALS — BP 142/88 | HR 85 | Temp 98.7°F | Ht 68.0 in | Wt 260.2 lb

## 2016-10-08 DIAGNOSIS — J309 Allergic rhinitis, unspecified: Secondary | ICD-10-CM | POA: Diagnosis not present

## 2016-10-08 DIAGNOSIS — M542 Cervicalgia: Secondary | ICD-10-CM | POA: Diagnosis not present

## 2016-10-08 MED ORDER — KETOROLAC TROMETHAMINE 30 MG/ML IJ SOLN
30.0000 mg | Freq: Once | INTRAMUSCULAR | Status: AC
  Administered 2016-10-08: 30 mg via INTRAMUSCULAR

## 2016-10-08 MED ORDER — KETOROLAC TROMETHAMINE 30 MG/ML IM SOLN: 30.0000 mg | Freq: Once | INTRAMUSCULAR | 0 refills | Status: DC

## 2016-10-08 NOTE — Progress Notes (Signed)
Subjective: CC: right ear pain TOI:ZTIWP W Renier is a 35 y.o. male presenting to clinic today for same day appointment. PCP: Darci Needle, MD Concerns today include:  1. Ear pain Patient reports that he recently recovered from a URI.  He notes persistent sensation of ear fullness R>L.  He does report a history of allergic rhinitis for which he used to take Flonase.  He reports he ran out of refills and has not taken this for some time.  He endorses sneezing, nasal congestion, post nasal drip.  Denies fevers, chills, nausea, vomiting, rashes, sick contacts.  He has not used any OTCs antihistamines for symptoms.  2. Neck pain Patient reports left sided neck pain that radiates down his left arm.  He describes a pins and needles pain.  He reports that this has been going on since Sept or Oct of this year.  He has never been seen for this.  He reports occ weakness in this arm.  He denies that the pain is associated with any particular activities, noting that it sometimes occurs while sitting.  He has used Aleve with no improvement.  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  Allergies  Allergen Reactions  . Metformin And Related Other (See Comments)    Stomach cramps     Social History Reviewed: active smoker. FamHx and MedHx reviewed.  Please see EMR. ROS: Per HPI  Objective: Office vital signs reviewed. BP (!) 142/88   Pulse 85   Temp 98.7 F (37.1 C) (Oral)   Ht $R'5\' 8"'wp$  (1.727 m)   Wt 260 lb 3.2 oz (118 kg)   SpO2 96%   BMI 39.56 kg/m   Physical Examination:  General: Awake, alert, overweight, No acute distress HEENT: Normal    Neck: No masses palpated. No lymphadenopathy    Ears: Tympanic membranes intact, slightly dulled light reflex, no erythema, no bulging    Eyes: PERRLA, EOMI, sclera white    Nose: nasal turbinates moist, clear nasal discharge    Throat:  moist mucus membranes, no erythema, no tonsillar exudate.  Airway is patent, cobblestoning of the o/p appreciated Cardio: regular rate and rhythm, S1S2 heard, no murmurs appreciated Pulm: clear to auscultation bilaterally, no wheezes, rhonchi or rales; normal work of breathing on room air MSK: normal gait and normal station, Full AROM of c-spine but some discomfort with rotation and extension of neck. No midline TTP, +TTP to trapezius L>R, negative Spurlings Neuro: 5/5 UE Strength and light touch sensation grossly intact  Assessment/ Plan: 35 y.o. male   1. Cervical pain (neck).  Offered c-spine imaging.  Patient would like to just have these done with ortho at his appointment.  No red flags.  I suspect he is experiencing radiculopathy from strain vs nerve impingement. - Ambulatory referral to Orthopedic Surgery - ketorolac (TORADOL) 30 MG/ML injection 30 mg; Inject 1 mL (30 mg total) into the muscle once. - diclofenac (VOLTAREN) 75 MG EC tablet; Take 1 tablet (75 mg total) by mouth 2 (two) times daily.  Dispense: 60 tablet; Refill: 0 - Return precautions reviewed  2. Chronic allergic rhinitis, unspecified seasonality, unspecified trigger.  No evidence of bacterial infection on exam.  I suspect he is having sinus/ ear fullness secondary to allergies. - fluticasone (FLONASE) 50 MCG/ACT nasal spray; Place 2 sprays into both nostrils daily.  Dispense: 16 g; Refill: 12 -  loratadine (CLARITIN) 10 MG tablet; Take 1 tablet (10 mg total) by mouth daily.  Dispense: 30 tablet; Refill: 11  Follow up with pcp prn  Janora Norlander, DO PGY-3, [facility]

## 2016-10-08 NOTE — Patient Instructions (Signed)
Allergic Rhinitis Allergic rhinitis is when the mucous membranes in the nose respond to allergens. Allergens are particles in the air that cause your body to have an allergic reaction. This causes you to release allergic antibodies. Through a chain of events, these eventually cause you to release histamine into the blood stream. Although meant to protect the body, it is this release of histamine that causes your discomfort, such as frequent sneezing, congestion, and an itchy, runny nose. What are the causes? Seasonal allergic rhinitis (hay fever) is caused by pollen allergens that may come from grasses, trees, and weeds. Year-round allergic rhinitis (perennial allergic rhinitis) is caused by allergens such as house dust mites, pet dander, and mold spores. What are the signs or symptoms?  Nasal stuffiness (congestion).  Itchy, runny nose with sneezing and tearing of the eyes. How is this diagnosed? Your health care provider can help you determine the allergen or allergens that trigger your symptoms. If you and your health care provider are unable to determine the allergen, skin or blood testing may be used. Your health care provider will diagnose your condition after taking your health history and performing a physical exam. Your health care provider may assess you for other related conditions, such as asthma, pink eye, or an ear infection. How is this treated? Allergic rhinitis does not have a cure, but it can be controlled by:  Medicines that block allergy symptoms. These may include allergy shots, nasal sprays, and oral antihistamines.  Avoiding the allergen. Hay fever may often be treated with antihistamines in pill or nasal spray forms. Antihistamines block the effects of histamine. There are over-the-counter medicines that may help with nasal congestion and swelling around the eyes. Check with your health care provider before taking or giving this medicine. If avoiding the allergen or the  medicine prescribed do not work, there are many new medicines your health care provider can prescribe. Stronger medicine may be used if initial measures are ineffective. Desensitizing injections can be used if medicine and avoidance does not work. Desensitization is when a patient is given ongoing shots until the body becomes less sensitive to the allergen. Make sure you follow up with your health care provider if problems continue. Follow these instructions at home: It is not possible to completely avoid allergens, but you can reduce your symptoms by taking steps to limit your exposure to them. It helps to know exactly what you are allergic to so that you can avoid your specific triggers. Contact a health care provider if:  You have a fever.  You develop a cough that does not stop easily (persistent).  You have shortness of breath.  You start wheezing.  Symptoms interfere with normal daily activities. This information is not intended to replace advice given to you by your health care provider. Make sure you discuss any questions you have with your health care provider. Document Released: 07/01/2001 Document Revised: 06/06/2016 Document Reviewed: 06/13/2013 Elsevier Interactive Patient Education  2017 Elsevier Inc.  

## 2016-10-09 MED ORDER — LORATADINE 10 MG PO TABS: 10.0000 mg | ORAL_TABLET | Freq: Every day | ORAL | 11 refills | Status: DC

## 2016-10-09 MED ORDER — FLUTICASONE PROPIONATE 50 MCG/ACT NA SUSP
2.0000 | Freq: Every day | NASAL | 12 refills | Status: DC
Start: 2016-10-09 — End: 2018-10-04

## 2016-10-09 MED ORDER — DICLOFENAC SODIUM 75 MG PO TBEC: 75.0000 mg | DELAYED_RELEASE_TABLET | Freq: Two times a day (BID) | ORAL | 0 refills | Status: DC

## 2016-10-14 ENCOUNTER — Encounter (HOSPITAL_COMMUNITY): Payer: Self-pay | Admitting: Emergency Medicine

## 2016-10-14 ENCOUNTER — Ambulatory Visit (HOSPITAL_COMMUNITY)
Admission: EM | Admit: 2016-10-14 | Discharge: 2016-10-14 | Disposition: A | Discharge status: Home / Self Care | Payer: Medicare Other | Attending: Emergency Medicine | Admitting: Emergency Medicine

## 2016-10-14 DIAGNOSIS — M5412 Radiculopathy, cervical region: Secondary | ICD-10-CM | POA: Diagnosis not present

## 2016-10-14 DIAGNOSIS — S46912D Strain of unspecified muscle, fascia and tendon at shoulder and upper arm level, left arm, subsequent encounter: Secondary | ICD-10-CM

## 2016-10-14 MED ORDER — CYCLOBENZAPRINE HCL 10 MG PO TABS: 10.0000 mg | ORAL_TABLET | Freq: Two times a day (BID) | ORAL | 0 refills | Status: DC | PRN

## 2016-10-14 MED ORDER — KETOROLAC TROMETHAMINE 60 MG/2ML IM SOLN
60.0000 mg | Freq: Once | INTRAMUSCULAR | Status: AC
  Administered 2016-10-14: 60 mg via INTRAMUSCULAR

## 2016-10-14 MED ORDER — PREDNISONE 10 MG (21) PO TBPK: 10.0000 mg | ORAL_TABLET | Freq: Every day | ORAL | 0 refills | Status: DC

## 2016-10-14 MED ORDER — DEXAMETHASONE SODIUM PHOSPHATE 10 MG/ML IJ SOLN
10.0000 mg | Freq: Once | INTRAMUSCULAR | Status: AC
  Administered 2016-10-14: 10 mg via INTRAMUSCULAR

## 2016-10-14 MED ORDER — KETOROLAC TROMETHAMINE 60 MG/2ML IM SOLN
INTRAMUSCULAR | Status: AC
  Filled 2016-10-14: qty 2

## 2016-10-14 MED ORDER — DEXAMETHASONE SODIUM PHOSPHATE 10 MG/ML IJ SOLN
INTRAMUSCULAR | Status: AC
  Filled 2016-10-14: qty 1

## 2016-10-14 NOTE — ED Triage Notes (Signed)
The patient presented to the [facility] with a complaint of left shoulder and neck pain that started 10 days ago. 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

## 2016-10-14 NOTE — ED Provider Notes (Signed)
CSN: [id number]     Arrival date & time 10/14/16  1409 History   First MD Initiated Contact with Patient 10/14/16 [phone]     Chief Complaint  Patient presents with  . Shoulder Pain  . Neck Pain   (Consider location/radiation/quality/duration/timing/severity/associated sxs/prior Treatment) 35 year old male presents to clinic with 2 week history of neck pain. 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 Patient has been evaluated by PCP for his pain and has an appointment scheduled with orthopedics for further evaluation of his pain at the end of the month but reports the pain as being significant. Patient denies fever, n/v/d, loss of sensation, or other red flag symptoms.   The history is provided by the patient.  Shoulder Pain  Associated symptoms: neck pain   Associated symptoms: no back pain and no fever   Neck Pain  Associated symptoms: no fever, no headaches, no numbness and no weakness     Past Medical History:  Diagnosis Date  . 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 Bipolar disorder ([place])    On disability  . Headache(784.0)   . Hyperlipidemia   . Neuromuscular disorder ([place])    Beckers muscular dystrophy  . Obesity   . Sleep apnea    Does not tolerate CPAP   Past Surgical History:  Procedure Laterality Date  . NM MYOCAR PERF WALL MOTION  01/22/2011   protocol: Persantine, moderate reversible inferior defect post stress EF 48%, high risk scan  . TRANSTHORACIC ECHOCARDIOGRAM  07/05/2004   EF=>55% normal study    Family History  Problem Relation Age of Onset  . COPD Mother   . Depression Mother   . Diabetes Mother   . Hyperlipidemia Mother   . Asthma Father   . Arthritis Father   . Diabetes Father   . Heart disease Father   . Hyperlipidemia Father   . Hypertension Father   . Hypertension Brother    Social History  Substance Use Topics  . Smoking status:  Current Every Day Smoker    Packs/day: 1.00    Years: 21.00    Types: Cigarettes  . Smokeless tobacco: Never Used     Comment: Reports using vapor cigarettes currently trying to quit  . Alcohol use No     Comment: quit 2013    Review of Systems  Constitutional: Negative for chills and fever.  HENT: Negative.   Eyes: Negative.   Respiratory: Negative.   Cardiovascular: Negative.   Musculoskeletal: Positive for neck pain and neck stiffness. Negative for back pain and gait problem.  Skin: Negative.   Neurological: Negative for dizziness, facial asymmetry, weakness, numbness and headaches.    Allergies  Penicillins and Metformin and related  Home Medications   Prior to Admission medications   Medication Sig Start Date End Date Taking? Authorizing Provider  ACCU-CHEK FASTCLIX LANCETS MISC Check blood sugar once daily 03/23/15   Leeanne Rio, MD  Blood Glucose Monitoring Suppl (ACCU-CHEK NANO SMARTVIEW) W/DEVICE KIT Check blood sugar once daily 03/23/15   Leeanne Rio, MD  cyclobenzaprine (FLEXERIL) 10 MG tablet Take 1 tablet (10 mg total) by mouth 2 (two) times daily as needed for muscle spasms. 10/14/16   Barnet Glasgow, NP  diclofenac (VOLTAREN) 75 MG EC tablet Take 1 tablet (75 mg total) by mouth 2 (two) times daily. 10/09/16   Janora Norlander, DO  fluticasone (FLONASE) 50 MCG/ACT nasal spray Place 2 sprays into both nostrils daily. 10/09/16   Ashly Hulen Skains, DO  gabapentin (NEURONTIN) 300 MG capsule Take 600 mg by mouth daily. 06/20/16   Historical Provider, MD  glipiZIDE (GLUCOTROL) 5 MG tablet TAKE 1 TABLET (5 MG TOTAL) BY MOUTH DAILY BEFORE BREAKFAST. 07/08/16   Hillary Percell Boston, MD  glucose blood [facility]) test strip Check blood sugar once daily 03/23/15   Latrelle Dodrill, MD  lamoTRIgine (LAMICTAL) 25 MG tablet Take 1 tab daily for 1 week and than 2 tab daily 12/26/15   Cleotis Nipper, MD  LATUDA 40 MG TABS tablet Take 80 mg by mouth daily.  05/20/16   Historical Provider, MD  loratadine (CLARITIN) 10 MG tablet Take 1 tablet (10 mg total) by mouth daily. 10/09/16   Ashly Hulen Skains, DO  nortriptyline (PAMELOR) 25 MG capsule Take 1 capsule (25 mg total) by mouth at bedtime. 12/26/15   Cleotis Nipper, MD  omeprazole (PRILOSEC) 40 MG capsule TAKE 1 CAPSULE (40 MG TOTAL) BY MOUTH DAILY. 07/08/16   Hillary Percell Boston, MD  predniSONE (STERAPRED UNI-PAK 21 TAB) 10 MG (21) TBPK tablet Take 1 tablet (10 mg total) by mouth daily. Take 6 tabs by mouth daily  for 2 days, then 5 tabs for 2 days, then 4 tabs for 2 days, then 3 tabs for 2 days, 2 tabs for 2 days, then 1 tab by mouth daily for 2 days 10/14/16   Dorena Bodo, NP  traZODone (DESYREL) 100 MG tablet Take 50-100 mg by mouth at bedtime as needed. 06/11/16   Historical Provider, MD  VYVANSE 50 MG capsule Take 50 mg by mouth daily. 06/19/16   Historical Provider, MD   Meds Ordered and Administered this Visit   Medications  ketorolac (TORADOL) injection 60 mg (not administered)  dexamethasone (DECADRON) injection 10 mg (not administered)    BP 139/73 (BP Location: Left Arm)   Pulse 80   Temp 97.9 F (36.6 C) (Oral)   Resp 18   SpO2 98%  No data found.   Physical Exam  Constitutional: He is oriented to person, place, and time. He appears well-developed and well-nourished. No distress.  HENT:  Head: Normocephalic.  Right Ear: External ear normal.  Left Ear: External ear normal.  Neck: Normal range of motion. Neck supple. No JVD present. Muscular tenderness (point tenderness within the trapezius muscle) present. No spinous process tenderness present. No neck rigidity. Normal range of motion present.  Cardiovascular: Normal rate, regular rhythm and normal heart sounds.   Pulmonary/Chest: Effort normal and breath sounds normal.  Abdominal: Soft.  Lymphadenopathy:    He has no cervical adenopathy.  Neurological: He is alert and oriented to person, place, and time. He displays  normal reflexes. No cranial nerve deficit. Coordination normal.  Skin: Skin is warm and dry. Capillary refill takes less than 2 seconds. He is not diaphoretic. No erythema. No pallor.  Psychiatric: He has a normal mood and affect.    Urgent Care Course   Clinical Course     Procedures (including critical care time)  Labs Review Labs Reviewed - No data to display  Imaging Review No results found.   Visual Acuity Review  Right Eye Distance:   Left Eye Distance:   Bilateral Distance:    Right Eye Near:   Left Eye Near:    Bilateral Near:         MDM   1. Strain of left shoulder,  subsequent encounter   2. Cervical radiculopathy    Patient given Toradol and dexamethazone injection in clinic. Rx Flexeril and prednisone taper for muscle pain. Advised patient to keep his appointment with orthopedics. Should symptoms worsen or fail to improve follow up with PCP or return to clinic for evaluation.     Barnet Glasgow, NP 10/14/16 1549

## 2016-10-15 ENCOUNTER — Ambulatory Visit: Payer: Self-pay | Admitting: Internal Medicine

## 2016-10-24 ENCOUNTER — Ambulatory Visit (INDEPENDENT_AMBULATORY_CARE_PROVIDER_SITE_OTHER): Payer: Medicare Other | Admitting: Internal Medicine

## 2016-10-24 ENCOUNTER — Encounter: Payer: Self-pay | Admitting: Internal Medicine

## 2016-10-24 VITALS — BP 100/52 | HR 99 | Temp 98.2°F | Ht 68.0 in | Wt 252.2 lb

## 2016-10-24 DIAGNOSIS — G479 Sleep disorder, unspecified: Secondary | ICD-10-CM

## 2016-10-24 DIAGNOSIS — G4700 Insomnia, unspecified: Secondary | ICD-10-CM | POA: Diagnosis not present

## 2016-10-24 DIAGNOSIS — E119 Type 2 diabetes mellitus without complications: Secondary | ICD-10-CM | POA: Diagnosis not present

## 2016-10-24 DIAGNOSIS — M62830 Muscle spasm of back: Secondary | ICD-10-CM | POA: Diagnosis not present

## 2016-10-24 DIAGNOSIS — M549 Dorsalgia, unspecified: Secondary | ICD-10-CM | POA: Diagnosis not present

## 2016-10-24 LAB — POCT GLYCOSYLATED HEMOGLOBIN (HGB A1C): Hemoglobin A1C: 6.5

## 2016-10-24 MED ORDER — CYCLOBENZAPRINE HCL 10 MG PO TABS: 10.0000 mg | ORAL_TABLET | Freq: Two times a day (BID) | ORAL | 0 refills | Status: DC | PRN

## 2016-10-24 MED ORDER — MELATONIN 10 MG PO TABS: 5.0000 mg | ORAL_TABLET | Freq: Every day | ORAL | 2 refills | Status: DC

## 2016-10-24 NOTE — Progress Notes (Signed)
[name] Progress Note  Subjective:  [name] is a 36 y.o. male who presents to follow-up diabetes and neck pain.   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 Was offered c-spine imaging at previous appointment but preferred to have done at scheduled Orthopedic appointment  - Mostly is experiencing feeling of "tightness" in his L neck and shoulder - Does improve some with heat - Plans to continue participating in wrestling ROS: No acute vision changes, no weakness  #Sleep difficulties: - Has trouble falling asleep, usually goes to bed in the early morning and sleeps until afternoon - Says trazodone doesn't help - Has not tried melatonin - Is on 50 mg vyvanse for reported ADHD and says he plans to continue using this - Concern for OSA but does not want to wear CPAP - On latuda and nortriptyline for bipolar disorder. Also prescribed lamotrigine but does not regularly take it. Followed by Dr. Adele Schilder.  ROS: NO SI/HI   Allergies  Allergen Reactions  . Penicillins Shortness Of Breath  . Metformin And Related Other (See Comments)    Stomach cramps     Objective: Blood pressure (!) 100/52, pulse 99, temperature 98.2 F (36.8 C), temperature source Oral,  height $Remov'5\' 8"'lKZyZH$  (1.727 m), weight 252 lb 3.2 oz (114.4 kg). Body mass index is 38.35 kg/m. Constitutional: Obese male, in NAD HENT: NCAT Cardiovascular: RRR, S1, S2, no m/r/g.  Musculoskeletal: TTP over L trapezius muscle. No TTP over midline spine. Slightly decreased ROM with Apley's scratch test on L due to anterior shoulder discomfort. FROM at neck.  Neurological: Peripheral sensation intact. Spurling's test negative.  Skin: Skin is warm and dry. No rash noted. No erythema.  Vitals reviewed  Assessment/Plan: Type 2 diabetes mellitus without complication - Hgb D6Q at 6.5, improved from 7.3 six months ago - Encouraged cutting back on sweet beverages - Patient does not want to restart glipizide and could not tolerate metformin; plans for diet control  - Counseled that continuing to lose weight could help avoid need for medication - Recheck hgb A1c in 6 months - Will refer for Ophthalmology appointment, though referral placed at appointment last fall  Back pain - Improving - Exam consistent with muscle strain/spasm though report of L arm tingling could indicate impingement but patient wanting to follow-up with Orthopedics as previously scheduled - Refilled flexeril to take prn - Advised trying to avoid situations where neck could be injured in wrestling scenarios  Sleep difficulties - Recommended stopping vyvanse -- unclear who has been prescribing - Suggested melatonin 5 mg nightly. Provided prescription at patient's request.   Follow-up in 6 months for hgb A1c recheck.  Olene Floss,  MD [name], PGY-2

## 2016-10-24 NOTE — Patient Instructions (Signed)
Mr. Tullis,  Continue flexeril as needed for muscle spasms.  Try melatonin before bed. Take 5 mg about an hour before you plan to sleep. You can increase to 10 mg if needed.  Please follow-up in 6 months to check on your diabetes or sooner if needed.  Best, Dr. Ola Spurr

## 2016-10-25 DIAGNOSIS — G479 Sleep disorder, unspecified: Secondary | ICD-10-CM | POA: Insufficient documentation

## 2016-10-25 DIAGNOSIS — M549 Dorsalgia, unspecified: Secondary | ICD-10-CM | POA: Insufficient documentation

## 2016-10-25 NOTE — Assessment & Plan Note (Signed)
-   Improving - Exam consistent with muscle strain/spasm though report of L arm tingling could indicate impingement but patient wanting to follow-up with Orthopedics as previously scheduled - Refilled flexeril to take prn - Advised trying to avoid situations where neck could be injured in wrestling scenarios

## 2016-10-25 NOTE — Assessment & Plan Note (Signed)
-   Recommended stopping vyvanse -- unclear who has been prescribing - Suggested melatonin 5 mg nightly. Provided prescription at patient's request.

## 2016-10-25 NOTE — Assessment & Plan Note (Addendum)
-   Hgb A1c at 6.5, improved from 7.3 six months ago - Encouraged cutting back on sweet beverages - Patient does not want to restart glipizide and could not tolerate metformin; plans for diet control  - Counseled that continuing to lose weight could help avoid need for medication - Recheck hgb A1c in 6 months - Will refer for Ophthalmology appointment, though referral placed at appointment last fall

## 2016-11-12 ENCOUNTER — Encounter (INDEPENDENT_AMBULATORY_CARE_PROVIDER_SITE_OTHER): Payer: Self-pay | Admitting: Orthopaedic Surgery

## 2016-11-12 ENCOUNTER — Ambulatory Visit (INDEPENDENT_AMBULATORY_CARE_PROVIDER_SITE_OTHER): Payer: Medicare Other

## 2016-11-12 ENCOUNTER — Ambulatory Visit (INDEPENDENT_AMBULATORY_CARE_PROVIDER_SITE_OTHER): Payer: Medicare Other | Admitting: Orthopaedic Surgery

## 2016-11-12 VITALS — BP 124/82 | HR 78 | Ht 68.0 in | Wt 253.0 lb

## 2016-11-12 DIAGNOSIS — M542 Cervicalgia: Secondary | ICD-10-CM

## 2016-11-12 NOTE — Progress Notes (Signed)
Office Visit Note   Patient: [name]           Date of Birth: [birth year]           MRN: [id number] Visit Date: 11/12/2016              Requested by: Janora Norlander, DO [address], [place] [address] PCP: Darci Needle, MD   Assessment & Plan: Visit Diagnoses:  1. Neck pain   2. Cervicalgia     Plan: Patient has some pre-existing spondylosis with acute neck sprain in December. We'll set him up for some physical therapy recheck him in 4 weeks. He can use some ice, heat, topical mineral ice , [place] etc.  Follow-Up Instructions: Return in about 4 weeks (around 12/10/2016).   Orders:  Orders Placed This Encounter  Procedures  . XR Cervical Spine 2 or 3 views   No orders of the defined types were placed in this encounter.     Procedures: No procedures performed   Clinical Data: No additional findings.   Subjective: Chief Complaint  Patient presents with  . Neck - Pain  . 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  States neck pain is radiating into left shoulder and left arm. Hurts to raise left arm above his head and he is having some tingling.  Patient was working a Orthoptist and findings indicate an immediate fell backwards injuring her shoulder and left side of his neck. States he has pain when he tries to raise his arm up over his head or turns his neck tingling radiates down to his fingers. Patient states he is disabled and this job as a part-time job. Has history with the alcohol abuse bipolar disorder sleep apnea obesity, history of narcotic abuse.  Review of Systems  Constitutional: Negative for chills and diaphoresis.  HENT: Negative for ear discharge, ear pain and nosebleeds.   Eyes: Negative for discharge and visual disturbance.  Respiratory: Negative for cough, choking and shortness of breath.   Cardiovascular: Negative for chest pain and palpitations.    Gastrointestinal: Negative for abdominal distention and abdominal pain.  Endocrine: Negative for cold intolerance and heat intolerance.  Genitourinary: Negative for flank pain and hematuria.  Musculoskeletal:       No past history of neck injuries.  Skin: Negative for rash and wound.  Neurological: Negative for seizures and speech difficulty.  Hematological: Negative for adenopathy. Does not bruise/bleed easily.  Psychiatric/Behavioral: Negative for agitation and suicidal ideas.     Objective: Vital Signs: BP 124/82   Pulse 78   Ht $R'5\' 8"'ly$  (1.727 m)   Wt 253 lb (114.8 kg)   BMI 38.47 kg/m   Physical Exam  Constitutional: He is oriented to person, place, and time. He appears well-developed and well-nourished.  HENT:  Head: Normocephalic and atraumatic.  Eyes: EOM are normal. Pupils are equal, round, and reactive to light.  Neck: No tracheal deviation present. No thyromegaly present.  Cardiovascular: Normal rate.   Pulmonary/Chest: Effort normal. He has no wheezes.  Abdominal: Soft. Bowel sounds are normal.  Musculoskeletal:  Patient placed tenderness on the left positive Spurling. Upper summary reflexes are 2+ and symmetrical right and left biceps triceps are strong no weakness wrist flexion-extension. Decreased subjective feeling over the C6 distribution left hand. No atrophy of the upper extremities normal heel toe gait.  Neurological: He is alert and oriented  to person, place, and time.  Skin: Skin is warm and dry. Capillary refill takes less than 2 seconds.  Psychiatric: He has a normal mood and affect. His behavior is normal. Judgment and thought content normal.    Ortho Exam  Specialty Comments:  No specialty comments available.  Imaging: Xr Cervical Spine 2 Or 3 Views  Result Date: 11/12/2016 AP lateral C-spine x-rays demonstrate loss of normal cervical lordosis with a straight cervical spinal lateral x-ray. Uncovertebral changes seen on AP x-ray) C5-6 with the disc  space narrowing and spurring noted on lateral without spondylolisthesis. Impression: No acute fracture spondylosis at C5-6    PMFS History: Patient Active Problem List   Diagnosis Date Noted  . Neck pain 11/12/2016  . Back pain 10/25/2016  . Sleep difficulties 10/25/2016  . Allergic rhinitis 04/28/2016  . Dizzy 12/07/2015  . Type 2 diabetes mellitus without complication ([place]) 51/70/0174  . GERD (gastroesophageal reflux disease) 03/21/2013  . Headache 01/31/2013  . Gait disorder 01/31/2013  . Routine adult health maintenance 07/25/2012  . Elevated BP 04/25/2012  . Narcotic abuse 06/05/2011  . OBESITY 10/17/2010  . 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 SLEEP APNEA 01/24/2009  . BIPOLAR DISORDER UNSPECIFIED 05/05/2008  . HYPERLIPIDEMIA 03/30/2008  . ABUSE, ALCOHOL, UNSPECIFIED 04/26/2007  . TOBACCO ABUSE 12/25/2006   Past Medical History:  Diagnosis Date  . 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 Bipolar disorder ([place])    On disability  . Headache(784.0)   . Hyperlipidemia   . Neuromuscular disorder ([place])    Beckers muscular dystrophy  . Obesity   . Sleep apnea    Does not tolerate CPAP    Family History  Problem Relation Age of Onset  . COPD Mother   . Depression Mother   . Diabetes Mother   . Hyperlipidemia Mother   . Asthma Father   . Arthritis Father   . Diabetes Father   . Heart disease Father   . Hyperlipidemia Father   . Hypertension Father   . Hypertension Brother     Past Surgical History:  Procedure Laterality Date  . NM MYOCAR PERF WALL MOTION  01/22/2011   protocol: Persantine, moderate reversible inferior defect post stress EF 48%, high risk scan  . TRANSTHORACIC ECHOCARDIOGRAM  07/05/2004   EF=>55% normal study    Social History   Occupational History  .  Unemployed   Social History Main Topics  . Smoking status: Current Every Day Smoker    Packs/day: 1.00    Years: 21.00    Types: Cigarettes  . Smokeless tobacco: Never Used     Comment:  Reports using vapor cigarettes currently trying to quit  . Alcohol use No     Comment: quit 2013  . Drug use: No  . Sexual activity: Yes    Birth control/ protection: None

## 2016-11-18 ENCOUNTER — Ambulatory Visit: Payer: Medicare Other | Admitting: Physical Therapy

## 2016-11-24 ENCOUNTER — Ambulatory Visit: Payer: Medicare Other | Admitting: Physical Therapy

## 2016-12-02 ENCOUNTER — Encounter: Payer: Self-pay | Admitting: Physical Therapy

## 2016-12-02 ENCOUNTER — Ambulatory Visit: Payer: Medicare Other | Attending: Orthopaedic Surgery | Admitting: Physical Therapy

## 2016-12-02 DIAGNOSIS — M542 Cervicalgia: Secondary | ICD-10-CM | POA: Insufficient documentation

## 2016-12-02 DIAGNOSIS — M62838 Other muscle spasm: Secondary | ICD-10-CM | POA: Diagnosis not present

## 2016-12-02 DIAGNOSIS — R293 Abnormal posture: Secondary | ICD-10-CM | POA: Insufficient documentation

## 2016-12-02 NOTE — Therapy (Signed)
[place], [place], [address] Phone: [phone]   Fax:  [phone]  Physical Therapy Evaluation  Patient Details  Name: [name] MRN: [id number] Date of Birth: [birth year] Referring Provider: Rodell Perna MD  Encounter Date: 12/02/2016      PT End of Session - 12/02/16 1629    Visit Number 1   Number of Visits 13   Date for PT Re-Evaluation 01/20/17   PT Start Time 8563   PT Stop Time 1628   PT Time Calculation (min) 43 min   Activity Tolerance Patient tolerated treatment well   Behavior During Therapy [facility] for tasks assessed/performed      Past Medical History:  Diagnosis Date  . Allergy   . Anxiety   . 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 Bipolar disorder ([place])    On disability  . Depression   . Diabetes mellitus without complication ([place])   . Headache(784.0)   . Hyperlipidemia   . Neuromuscular disorder ([place])    Beckers muscular dystrophy  . Obesity   . Sleep apnea    Does not tolerate CPAP    Past Surgical History:  Procedure Laterality Date  . NM MYOCAR PERF WALL MOTION  01/22/2011   protocol: Persantine, moderate reversible inferior defect post stress EF 48%, high risk scan  . TRANSTHORACIC ECHOCARDIOGRAM  07/05/2004   EF=>55% normal study     There were no vitals filed for this visit.       Subjective Assessment - 12/02/16 1551    Subjective pt is a 36 y.o M with CC of neck pain that started December of 2017 while referring a wrestling. Since getting hit he reports its conintued to be painful, with referral of pain to the L shoulder and to the lower back, with report of tingling into the whole hand. reports frequent headaches, but denies diplopia. since the incident the pain has gotten alittle better but it is still about the same.    Limitations Lifting   How long can you sit comfortably? 10-15 min   How long can you stand comfortably? unlimited   How long can you walk  comfortably? unlimited   Diagnostic tests X-ray   Patient Stated Goals to get rid of the pain,    Currently in Pain? Yes   Pain Score 5   at worst 10/10   Pain Location Neck   Pain Orientation Left   Pain Descriptors / Indicators Sharp;Aching;Sore   Pain Type Chronic pain   Pain Radiating Towards to the left shoulder/ low back and tingling to the L hand.    Pain Onset More than a month ago   Pain Frequency Constant   Aggravating Factors  doing activities, cold at night time, lifting the arm over head   Pain Relieving Factors rubbing, heat            [facility] PT Assessment - 12/02/16 1543      Assessment   Medical Diagnosis Cervical sprain and spondylosis at C5-C6   Referring Provider Rodell Perna MD   Onset Date/Surgical Date --  December 2017   Hand Dominance Right   Next MD Visit --  1-2 weeks   Prior Therapy yes  knee when he was younger     Precautions   Precautions None     Restrictions   Weight Bearing Restrictions No     Balance Screen   Has the patient fallen in the past 6 months No  Has the patient had a decrease in activity level because of a fear of falling?  No   Is the patient reluctant to leave their home because of a fear of falling?  No     Home Social worker Private residence   Living Arrangements Parent   Available Help at Discharge Family;Available PRN/intermittently   Type of Home House   Home Access Stairs to enter   Entrance Stairs-Number of Steps 5   Entrance Stairs-Rails None   Home Layout One level     Prior Function   Level of Independence Independent;Independent with basic ADLs   Vocation Part time employment  refereing    Vocation Requirements moving, lifting, pushing, carrying, twisting,    Leisure play video games,      Cognition   Overall Cognitive Status Within Functional Limits for tasks assessed     Observation/Other Assessments   Focus on Therapeutic Outcomes (FOTO)  44% limited  predicted 28% limited      Posture/Postural Control   Posture/Postural Control Postural limitations   Postural Limitations Rounded Shoulders;Forward head;Increased thoracic kyphosis  maintained slight R cervial sidebend     ROM / Strength   AROM / PROM / Strength AROM;Strength     AROM   AROM Assessment Site Cervical   Cervical Flexion 26  ERP   Cervical Extension 14  ERP   Cervical - Right Side Bend 22   Cervical - Left Side Bend 16   Cervical - Right Rotation 37  ERP   Cervical - Left Rotation 19  ERP     Palpation   Palpation comment tightness in bil upper traps with L>R, bil levator scapulae tightness with R>L. L cervical multifidus tightness     Special Tests    Special Tests Cervical   Cervical Tests Spurling's;Dictraction;other     Spurling's   Findings Negative     Distraction Test   Findngs Positive   side Left   Comment decreased tingling in the L hand     other    Findings Positive   Comment ULLT testing Median nerve.                    [place] Adult PT Treatment/Exercise - 12/02/16 1543      Neck Exercises: Supine   Neck Retraction 10 reps;5 secs     Manual Therapy   Manual Therapy Passive ROM   Manual therapy comments manual trigger point release 3 x in the L upper trap   Passive ROM L cervical rotation with gentle distraction                PT Education - 12/02/16 1629    Education provided Yes   Education Details evaluation findings, POC, goals, HEP with proper form and treatment rationale.    Person(s) Educated Patient   Methods Explanation;Verbal cues;Handout;Demonstration   Comprehension Verbalized understanding;Verbal cues required;Returned demonstration          PT Short Term Goals - 12/02/16 1642      PT SHORT TERM GOAL #1   Title pt will be I with inital HEP (12/23/2016)   Time 3   Period Weeks   Status New     PT SHORT TERM GOAL #2   Title pt will be able to verbalize and demo proper posture, lifting and carrying mechanics to  prevent and reduce neck pain (12/23/2016)   Time 3   Period Weeks   Status New  PT SHORT TERM GOAL #3   Title pt will demonstrate decreased muscle spasm of bil upper traps and surrounding musculature to reduce pain to </=4/10 and promote cervical mobility (12/23/2016)   Time 3   Period Weeks   Status New           PT Long Term Goals - 12/02/16 1644      PT LONG TERM GOAL #1   Title pt will be I with all HEP given as of last visit (01/20/2017)   Time 6   Period Weeks   Status New     PT LONG TERM GOAL #2   Title pt will increase cervical mobility by >/= 15 degrees in all planes with </= 2/10 pain and no report of tingling in the hand to assist with safety during driving and ADLS (03/22/9527)   Time 6   Period Weeks   Status New     PT LONG TERM GOAL #3   Title pt will be to lift and carry >/=20# from floor <>waist, and from waist to overhead with </= 2/10 pain to promote functional lifting and carrying and pt's personal goals of wrestling (01/20/2017)   Time 6   Period Weeks   Status New     PT LONG TERM GOAL #4   Title pt will increase his FOTO score to </= 28% limited to demonstrate improvement in function (01/20/2017)   Time 6   Period Weeks   Status New               Plan - 12/02/16 1630    Clinical Impression Statement Mr. Nay presents to [employer] as a moderate complexity evaluation based on PMHx, continued pain and evaluation findings of CC of neck pain. He demonstrates significant limitation with cervical mobility in all planes due to pain, PROM for cervial mobility is limited to due muscle tightness and pain. spasm in bil upper traps and levator scapulae. 3/4 cervical radiculopathy special testing indicating high probably of neurologic inovlvment into the LUE. He would benefit from phyiscal therapty to reduce spasm, decrease pain, increase cervical mobility and maximize his function by Doralee Albino the deficits listed.    Rehab Potential Good   PT Frequency 2x / week    PT Duration 6 weeks   PT Treatment/Interventions ADLs/Self Care Home Management;Cryotherapy;Electrical Stimulation;Iontophoresis 4mg /ml Dexamethasone;Moist Heat;Traction;Ultrasound;Therapeutic activities;Therapeutic exercise;Dry needling;Taping;Manual techniques;Patient/family education   PT Next Visit Plan assess/ review HEP, manual for L upper trap, cervical PROM to the L and mobs, modalities PRN, stretching of the L upper trap, DN?   PT Home Exercise Plan upper cervical rotation, upper trap/levator scapulae stretching, chin tucks   Consulted and Agree with Plan of Care Patient      Patient will benefit from skilled therapeutic intervention in order to improve the following deficits and impairments:  Pain, Improper body mechanics, Postural dysfunction, Decreased endurance, Decreased activity tolerance, Decreased strength, Difficulty walking, Increased muscle spasms, Increased fascial restricitons  Visit Diagnosis: Cervicalgia - Plan: PT plan of care cert/re-cert  Abnormal posture - Plan: PT plan of care cert/re-cert  Other muscle spasm - Plan: PT plan of care cert/re-cert      G-Codes - 41/32/44 1649    Functional Limitation Changing and maintaining body position   Changing and Maintaining Body Position Current Status (W1027) At least 20 percent but less than 40 percent impaired, limited or restricted   Changing and Maintaining Body Position Goal Status (O5366) At least 1 percent but less than 20 percent impaired, limited or  restricted   Changing and Maintaining Body Position Discharge Status [phone]) --       Problem List Patient Active Problem List   Diagnosis Date Noted  . Neck pain 11/12/2016  . Back pain 10/25/2016  . Sleep difficulties 10/25/2016  . Allergic rhinitis 04/28/2016  . Dizzy 12/07/2015  . Type 2 diabetes mellitus without complication ([place]) 75/17/0017  . GERD (gastroesophageal reflux disease) 03/21/2013  . Headache 01/31/2013  . Gait disorder 01/31/2013  .  Routine adult health maintenance 07/25/2012  . Elevated BP 04/25/2012  . Narcotic abuse 06/05/2011  . OBESITY 10/17/2010  . 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 SLEEP APNEA 01/24/2009  . BIPOLAR DISORDER UNSPECIFIED 05/05/2008  . HYPERLIPIDEMIA 03/30/2008  . ABUSE, ALCOHOL, UNSPECIFIED 04/26/2007  . TOBACCO ABUSE 12/25/2006   Starr Lake PT, DPT, LAT, ATC  12/02/16  4:55 PM      [facility] [address] [place], [place], [address] Phone: [phone]   Fax:  [phone]  Name: [name] MRN: [id number] Date of Birth: [birth year]

## 2016-12-09 ENCOUNTER — Ambulatory Visit: Payer: Medicare Other | Admitting: Physical Therapy

## 2016-12-09 DIAGNOSIS — M62838 Other muscle spasm: Secondary | ICD-10-CM | POA: Diagnosis not present

## 2016-12-09 DIAGNOSIS — M542 Cervicalgia: Secondary | ICD-10-CM

## 2016-12-09 DIAGNOSIS — R293 Abnormal posture: Secondary | ICD-10-CM | POA: Diagnosis not present

## 2016-12-10 NOTE — Therapy (Signed)
[name], [place], [address] Phone: [phone]   Fax:  [phone]  Physical Therapy Treatment  Patient Details  Name: [name] MRN: [id number] Date of Birth: [birth year] Referring Provider: Rodell Perna MD  Encounter Date: 12/09/2016      PT End of Session - 12/10/16 0836    Visit Number 2   Number of Visits 13   Date for PT Re-Evaluation 01/20/17   PT Start Time 7209   PT Stop Time 1627   PT Time Calculation (min) 42 min   Activity Tolerance Patient tolerated treatment well   Behavior During Therapy [facility] for tasks assessed/performed      Past Medical History:  Diagnosis Date  . Allergy   . Anxiety   . 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 Bipolar disorder ([place])    On disability  . Depression   . Diabetes mellitus without complication ([place])   . Headache(784.0)   . Hyperlipidemia   . Neuromuscular disorder ([place])    Beckers muscular dystrophy  . Obesity   . Sleep apnea    Does not tolerate CPAP    Past Surgical History:  Procedure Laterality Date  . NM MYOCAR PERF WALL MOTION  01/22/2011   protocol: Persantine, moderate reversible inferior defect post stress EF 48%, high risk scan  . TRANSTHORACIC ECHOCARDIOGRAM  07/05/2004   EF=>55% normal study     There were no vitals filed for this visit.      Subjective Assessment - 12/10/16 0834    Subjective Patient reports he is feeling a little better. he continues to wrestle. He has a Orthoptist on Saturday. He was advised not to do anything that cuases him increased pain. He has been doing his exercises.    Limitations Lifting   How long can you sit comfortably? 10-15 min   How long can you stand comfortably? unlimited   How long can you walk comfortably? unlimited   Diagnostic tests X-ray   Patient Stated Goals to get rid of the pain,    Currently in Pain? Yes   Pain Score 5    Pain Location Neck   Pain Orientation Left   Pain  Descriptors / Indicators Aching;Sharp   Pain Type Chronic pain   Pain Radiating Towards to left shoulkder and lower back    Pain Onset More than a month ago   Pain Frequency Constant   Aggravating Factors  doing activity    Pain Relieving Factors rubbing, heat                          [facility] Adult PT Treatment/Exercise - 12/10/16 0001      Neck Exercises: Standing   Other Standing Exercises standing wall posture 5 seconds x10; pec stretch 3x10sec hold; Scap retraction green 2x10; shoulder extension 2x10;      Neck Exercises: Supine   Neck Retraction 10 reps;5 secs     Manual Therapy   Manual Therapy Passive ROM   Manual therapy comments manual trigger point release 3 x in the L upper trap; IASTYM to left upper trap, manual traction,                 PT Education - 12/10/16 0950    Education provided Yes   Education Details reviewed the improtacne of posture.    Person(s) Educated Patient   Methods Explanation;Demonstration   Comprehension Verbalized understanding;Returned demonstration;Verbal cues required  PT Short Term Goals - 12/10/16 0955      PT SHORT TERM GOAL #1   Title pt will be I with inital HEP (12/23/2016)   Time 3   Period Weeks   Status On-going     PT SHORT TERM GOAL #2   Title pt will be able to verbalize and demo proper posture, lifting and carrying mechanics to prevent and reduce neck pain (12/23/2016)   Time 3   Period Weeks   Status On-going     PT SHORT TERM GOAL #3   Title pt will demonstrate decreased muscle spasm of bil upper traps and surrounding musculature to reduce pain to </=4/10 and promote cervical mobility (12/23/2016)   Time 3   Period Weeks   Status On-going           PT Long Term Goals - 12/02/16 1644      PT LONG TERM GOAL #1   Title pt will be I with all HEP given as of last visit (01/20/2017)   Time 6   Period Weeks   Status New     PT LONG TERM GOAL #2   Title pt will increase cervical  mobility by >/= 15 degrees in all planes with </= 2/10 pain and no report of tingling in the hand to assist with safety during driving and ADLS (10/25/1094)   Time 6   Period Weeks   Status New     PT LONG TERM GOAL #3   Title pt will be to lift and carry >/=20# from floor <>waist, and from waist to overhead with </= 2/10 pain to promote functional lifting and carrying and pt's personal goals of wrestling (01/20/2017)   Time 6   Period Weeks   Status New     PT LONG TERM GOAL #4   Title pt will increase his FOTO score to </= 28% limited to demonstrate improvement in function (01/20/2017)   Time 6   Period Weeks   Status New               Plan - 12/10/16 0454    Clinical Impression Statement Patient reported no increased pain with treatment. He was given psoterior chain strengthening for home. He continues to have significant spasming in his upper trap. Therapy focused on manual therapy to reduce spasming.    PT Frequency 2x / week   PT Duration 6 weeks   PT Treatment/Interventions ADLs/Self Care Home Management;Cryotherapy;Electrical Stimulation;Iontophoresis 4mg /ml Dexamethasone;Moist Heat;Traction;Ultrasound;Therapeutic activities;Therapeutic exercise;Dry needling;Taping;Manual techniques;Patient/family education   PT Next Visit Plan assess/ review HEP, manual for L upper trap, cervical PROM to the L and mobs, modalities PRN, stretching of the L upper trap, DN?   PT Home Exercise Plan upper cervical rotation, upper trap/levator scapulae stretching, chin tucks   Consulted and Agree with Plan of Care Patient      Patient will benefit from skilled therapeutic intervention in order to improve the following deficits and impairments:  Pain, Improper body mechanics, Postural dysfunction, Decreased endurance, Decreased activity tolerance, Decreased strength, Difficulty walking, Increased muscle spasms, Increased fascial restricitons  Visit Diagnosis: Cervicalgia  Abnormal  posture  Other muscle spasm     Problem List Patient Active Problem List   Diagnosis Date Noted  . Neck pain 11/12/2016  . Back pain 10/25/2016  . Sleep difficulties 10/25/2016  . Allergic rhinitis 04/28/2016  . Dizzy 12/07/2015  . Type 2 diabetes mellitus without complication ([place]) 09/81/1914  . GERD (gastroesophageal reflux disease) 03/21/2013  . Headache 01/31/2013  .  Gait disorder 01/31/2013  . Routine adult health maintenance 07/25/2012  . Elevated BP 04/25/2012  . Narcotic abuse 06/05/2011  . OBESITY 10/17/2010  . 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 SLEEP APNEA 01/24/2009  . BIPOLAR DISORDER UNSPECIFIED 05/05/2008  . HYPERLIPIDEMIA 03/30/2008  . ABUSE, ALCOHOL, UNSPECIFIED 04/26/2007  . TOBACCO ABUSE 12/25/2006    Carney Living PT DPT  12/10/2016, 9:56 AM  [facility] [address] [place], [place], [address] Phone: [phone]   Fax:  [phone]  Name: AMEER SANDEN MRN: [id number] Date of Birth: 11-08-1980

## 2016-12-15 ENCOUNTER — Ambulatory Visit: Payer: Medicare Other | Admitting: Physical Therapy

## 2016-12-16 ENCOUNTER — Ambulatory Visit (INDEPENDENT_AMBULATORY_CARE_PROVIDER_SITE_OTHER): Payer: Medicare Other | Admitting: Orthopaedic Surgery

## 2016-12-18 ENCOUNTER — Ambulatory Visit: Payer: Medicare Other | Admitting: Physical Therapy

## 2016-12-23 ENCOUNTER — Ambulatory Visit: Payer: Medicare Other | Attending: Orthopaedic Surgery | Admitting: Physical Therapy

## 2016-12-23 DIAGNOSIS — M542 Cervicalgia: Secondary | ICD-10-CM | POA: Insufficient documentation

## 2016-12-23 DIAGNOSIS — M62838 Other muscle spasm: Secondary | ICD-10-CM | POA: Insufficient documentation

## 2016-12-23 DIAGNOSIS — R293 Abnormal posture: Secondary | ICD-10-CM | POA: Diagnosis not present

## 2016-12-23 NOTE — Therapy (Signed)
[place], [place], [address] Phone: [phone]   Fax:  [phone]  Physical Therapy Treatment  Patient Details  Name: [name] MRN: [id number] Date of Birth: 28-Mar-1981 Referring Provider: Rodell Perna MD  Encounter Date: 12/23/2016      PT End of Session - 12/23/16 1621    Visit Number 3   Number of Visits 13   Date for PT Re-Evaluation 01/20/17   PT Start Time 9211   PT Stop Time 1631   PT Time Calculation (min) 46 min   Activity Tolerance Patient tolerated treatment well   Behavior During Therapy [facility] for tasks assessed/performed      Past Medical History:  Diagnosis Date  . Allergy   . Anxiety   . 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 Bipolar disorder ([place])    On disability  . Depression   . Diabetes mellitus without complication ([place])   . Headache(784.0)   . Hyperlipidemia   . Neuromuscular disorder ([place])    Beckers muscular dystrophy  . Obesity   . Sleep apnea    Does not tolerate CPAP    Past Surgical History:  Procedure Laterality Date  . NM MYOCAR PERF WALL MOTION  01/22/2011   protocol: Persantine, moderate reversible inferior defect post stress EF 48%, high risk scan  . TRANSTHORACIC ECHOCARDIOGRAM  07/05/2004   EF=>55% normal study     There were no vitals filed for this visit.      Subjective Assessment - 12/23/16 1543    Subjective (P)  "still alittle sore, but not doing too bad. I had a show the last week and I am pretty sore"    Currently in Pain? (P)  Yes   Pain Score (P)  5    Pain Location (P)  Neck   Pain Orientation (P)  Left   Pain Frequency (P)  Constant   Aggravating Factors  (P)  any activity/ wrestling   Pain Relieving Factors (P)  rubbing, heat                         [facility] Adult PT Treatment/Exercise - 12/23/16 0001      Neck Exercises: Standing   Other Standing Exercises Rows     Neck Exercises: Seated   Other Seated  Exercise thoracic rotation 2 x 10   squeezing bolster      Neck Exercises: Supine   Neck Retraction 10 reps;5 secs   Other Supine Exercise thoracic extension over bolster 2 x 10   bolster around shoulder blades and elbows adducted     Modalities   Modalities Ultrasound;Traction     Ultrasound   Ultrasound Location L upper trap   Ultrasound Parameters W/cm2 1.0, x 8 min    Ultrasound Goals Pain  stretchiing upper trap periodically 4 x 30 sec     Traction   Type of Traction Cervical   Min (lbs) 8   Max (lbs) 15   Hold Time 60   Rest Time 30   Time 10     Manual Therapy   Manual therapy comments manual trigger point release 3 x in the L upper trap; IASTYM to left upper trap, manual traction,                 PT Education - 12/23/16 1620    Education provided Yes   Education Details what mechanical traction is and benefits/ after care  Person(s) Educated Patient   Methods Explanation;Verbal cues   Comprehension Verbalized understanding;Verbal cues required          PT Short Term Goals - 12/10/16 0955      PT SHORT TERM GOAL #1   Title pt will be I with inital HEP (12/23/2016)   Time 3   Period Weeks   Status On-going     PT SHORT TERM GOAL #2   Title pt will be able to verbalize and demo proper posture, lifting and carrying mechanics to prevent and reduce neck pain (12/23/2016)   Time 3   Period Weeks   Status On-going     PT SHORT TERM GOAL #3   Title pt will demonstrate decreased muscle spasm of bil upper traps and surrounding musculature to reduce pain to </=4/10 and promote cervical mobility (12/23/2016)   Time 3   Period Weeks   Status On-going           PT Long Term Goals - 12/02/16 1644      PT LONG TERM GOAL #1   Title pt will be I with all HEP given as of last visit (01/20/2017)   Time 6   Period Weeks   Status New     PT LONG TERM GOAL #2   Title pt will increase cervical mobility by >/= 15 degrees in all planes with </= 2/10 pain and  no report of tingling in the hand to assist with safety during driving and ADLS (10/26/107)   Time 6   Period Weeks   Status New     PT LONG TERM GOAL #3   Title pt will be to lift and carry >/=20# from floor <>waist, and from waist to overhead with </= 2/10 pain to promote functional lifting and carrying and pt's personal goals of wrestling (01/20/2017)   Time 6   Period Weeks   Status New     PT LONG TERM GOAL #4   Title pt will increase his FOTO score to </= 28% limited to demonstrate improvement in function (01/20/2017)   Time 6   Period Weeks   Status New               Plan - 12/23/16 1621    Clinical Impression Statement Mr. Chopp reports some improvement in pain and mild decrease in N/T in the R hand. focused on thoracic mobility exercises and posture correction which he performed well. trialed cervical traction today to relieve N/T in the L hand.    PT Next Visit Plan assess traction benefits., manual for L upper trap, cervical PROM to the L and mobs, modalities PRN, stretching of the L upper trap, DN?   PT Home Exercise Plan upper cervical rotation, upper trap/levator scapulae stretching, chin tucks, thoracic extension/ rotation.    Consulted and Agree with Plan of Care Patient      Patient will benefit from skilled therapeutic intervention in order to improve the following deficits and impairments:  Pain, Improper body mechanics, Postural dysfunction, Decreased endurance, Decreased activity tolerance, Decreased strength, Difficulty walking, Increased muscle spasms, Increased fascial restricitons  Visit Diagnosis: Cervicalgia  Abnormal posture  Other muscle spasm     Problem List Patient Active Problem List   Diagnosis Date Noted  . Neck pain 11/12/2016  . Back pain 10/25/2016  . Sleep difficulties 10/25/2016  . Allergic rhinitis 04/28/2016  . Dizzy 12/07/2015  . Type 2 diabetes mellitus without complication ([place]) 32/35/5732  . GERD (gastroesophageal reflux  disease) 03/21/2013  .  Headache 01/31/2013  . Gait disorder 01/31/2013  . Routine adult health maintenance 07/25/2012  . Elevated BP 04/25/2012  . Narcotic abuse 06/05/2011  . OBESITY 10/17/2010  . 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 SLEEP APNEA 01/24/2009  . BIPOLAR DISORDER UNSPECIFIED 05/05/2008  . HYPERLIPIDEMIA 03/30/2008  . ABUSE, ALCOHOL, UNSPECIFIED 04/26/2007  . TOBACCO ABUSE 12/25/2006   Starr Lake PT, DPT, LAT, ATC  12/23/16  4:26 PM      [facility] [address] [place], [place], [address] Phone: [phone]   Fax:  [phone]  Name: KARMA HINEY MRN: [id number] Date of Birth: [birth year]

## 2016-12-25 ENCOUNTER — Ambulatory Visit: Payer: Medicare Other | Admitting: Physical Therapy

## 2016-12-30 ENCOUNTER — Ambulatory Visit: Payer: Medicare Other | Admitting: Physical Therapy

## 2016-12-30 ENCOUNTER — Telehealth: Payer: Self-pay | Admitting: Physical Therapy

## 2016-12-30 NOTE — Telephone Encounter (Signed)
Mr [name] was called about a missed visit today.  He did not realize he had an appointment.  He had checked his schedule earlier and did not find the appointment. He was advised of his next appointment time and date.  01/01/2017 at 3:00.  He is still interested in attending PT. Melvenia Needles PTA

## 2017-01-01 ENCOUNTER — Ambulatory Visit: Payer: Medicare Other | Admitting: Physical Therapy

## 2017-01-01 DIAGNOSIS — R293 Abnormal posture: Secondary | ICD-10-CM | POA: Diagnosis not present

## 2017-01-01 DIAGNOSIS — M62838 Other muscle spasm: Secondary | ICD-10-CM

## 2017-01-01 DIAGNOSIS — M542 Cervicalgia: Secondary | ICD-10-CM | POA: Diagnosis not present

## 2017-01-01 NOTE — Therapy (Signed)
[place], [place], [address] Phone: [phone]   Fax:  [phone]  Physical Therapy Treatment  Patient Details  Name: [name] MRN: [id number] Date of Birth: 1980/12/31 Referring Provider: Rodell Perna MD  Encounter Date: 01/01/2017      PT End of Session - 01/01/17 1512    Visit Number 4   Number of Visits 13   Date for PT Re-Evaluation 01/20/17   PT Start Time 1504   PT Stop Time 1543   PT Time Calculation (min) 39 min   Activity Tolerance Patient tolerated treatment well   Behavior During Therapy [facility] for tasks assessed/performed      Past Medical History:  Diagnosis Date  . Allergy   . Anxiety   . 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 Bipolar disorder ([place])    On disability  . Depression   . Diabetes mellitus without complication ([place])   . Headache(784.0)   . Hyperlipidemia   . Neuromuscular disorder ([place])    Beckers muscular dystrophy  . Obesity   . Sleep apnea    Does not tolerate CPAP    Past Surgical History:  Procedure Laterality Date  . NM MYOCAR PERF WALL MOTION  01/22/2011   protocol: Persantine, moderate reversible inferior defect post stress EF 48%, high risk scan  . TRANSTHORACIC ECHOCARDIOGRAM  07/05/2004   EF=>55% normal study     There were no vitals filed for this visit.      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    Currently in Pain? Yes   Pain Score 4    Pain Location Neck   Pain Orientation Left   Pain Descriptors / Indicators Aching;Sore   Pain Onset More than a month ago   Pain Frequency Intermittent   Aggravating Factors  N/A   Pain Relieving Factors rubbing                         [facility] Adult PT Treatment/Exercise - 01/01/17 1509      Neck Exercises: Standing   Neck Retraction 15 reps   x 2 sets, using wall as tactile feedback   Wall Push Ups 15 reps  x 2 sets   Wall Wash 2 x 15 CW/CCW at shoulder height   1 set at shoulder height, 1 set above shoulder height   Other Standing Exercises Rows 2 x 15  blue theraband     Neck Exercises: Supine   Other Supine Exercise scapular stability routine, sustained horizontal abduction with bil shoulder flex/ extesnoin 2 x 12 with narrow grip, 2x 12 with wide grip, D2 2 x 12 each, Money exercise 2 x 12     Shoulder Exercises: ROM/Strengthening   UBE (Upper Arm Bike) L 1.5 x 6 min  changing direction 3 min     Shoulder Exercises: Body Blade   Other Body Blade Exercises IR/ER 3 x 15 sec      Neck Exercises: Stretches   Upper Trapezius Stretch 2 reps;30 seconds   Levator Stretch 2 reps;30 seconds                  PT Short Term Goals - 12/10/16 0955      PT SHORT TERM GOAL #1   Title pt will be I with  inital HEP (12/23/2016)   Time 3   Period Weeks   Status On-going     PT SHORT TERM GOAL #2   Title pt will be able to verbalize and demo proper posture, lifting and carrying mechanics to prevent and reduce neck pain (12/23/2016)   Time 3   Period Weeks   Status On-going     PT SHORT TERM GOAL #3   Title pt will demonstrate decreased muscle spasm of bil upper traps and surrounding musculature to reduce pain to </=4/10 and promote cervical mobility (12/23/2016)   Time 3   Period Weeks   Status On-going           PT Long Term Goals - 12/02/16 1644      PT LONG TERM GOAL #1   Title pt will be I with all HEP given as of last visit (01/20/2017)   Time 6   Period Weeks   Status New     PT LONG TERM GOAL #2   Title pt will increase cervical mobility by >/= 15 degrees in all planes with </= 2/10 pain and no report of tingling in the hand to assist with safety during driving and ADLS (12/25/8586)   Time 6   Period Weeks   Status New     PT LONG TERM GOAL #3   Title pt will be to lift and carry >/=20# from floor  <>waist, and from waist to overhead with </= 2/10 pain to promote functional lifting and carrying and pt's personal goals of wrestling (01/20/2017)   Time 6   Period Weeks   Status New     PT LONG TERM GOAL #4   Title pt will increase his FOTO score to </= 28% limited to demonstrate improvement in function (01/20/2017)   Time 6   Period Weeks   Status New               Plan - 01/01/17 1539    Clinical Impression Statement pt reports no numbness in the hand any more, just soreness in the shoulder. Focused today on strengthening to promote posture and scapular stability. He was able to do all exericses requiring verbal cues on form. He reported no pain post session. Plan to continue for 2 more sessions and if he is doing well plan to D/C.    PT Next Visit Plan scapular stabilizer exericses, posture correction exercise, Manual PRN, update HEP for scapular stabilzing exercises.    PT Home Exercise Plan upper cervical rotation, upper trap/levator scapulae stretching, chin tucks, thoracic extension/ rotation.    Consulted and Agree with Plan of Care Patient      Patient will benefit from skilled therapeutic intervention in order to improve the following deficits and impairments:  Pain, Improper body mechanics, Postural dysfunction, Decreased endurance, Decreased activity tolerance, Decreased strength, Difficulty walking, Increased muscle spasms, Increased fascial restricitons  Visit Diagnosis: Cervicalgia  Abnormal posture  Other muscle spasm     Problem List Patient Active Problem List   Diagnosis Date Noted  . Neck pain 11/12/2016  . Back pain 10/25/2016  . Sleep difficulties 10/25/2016  . Allergic rhinitis 04/28/2016  . Dizzy 12/07/2015  . Type 2 diabetes mellitus without complication ([place]) 50/27/7412  . GERD (gastroesophageal reflux disease) 03/21/2013  . Headache 01/31/2013  . Gait disorder 01/31/2013  . Routine adult health maintenance 07/25/2012  . Elevated BP  04/25/2012  . Narcotic abuse 06/05/2011  . OBESITY 10/17/2010  . 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 SLEEP APNEA  01/24/2009  . BIPOLAR DISORDER UNSPECIFIED 05/05/2008  . HYPERLIPIDEMIA 03/30/2008  . ABUSE, ALCOHOL, UNSPECIFIED 04/26/2007  . TOBACCO ABUSE 12/25/2006    Starr Lake PT, DPT, LAT, ATC  01/01/17  3:43 PM      [place] [facility] [address] [place], [place], [address] Phone: [phone]   Fax:  [phone]  Name: [name] MRN: [id number] Date of Birth: 1981-09-20

## 2017-01-06 ENCOUNTER — Ambulatory Visit: Payer: Medicare Other | Admitting: Physical Therapy

## 2017-01-12 ENCOUNTER — Ambulatory Visit: Payer: Medicare Other | Admitting: Physical Therapy

## 2017-01-14 ENCOUNTER — Ambulatory Visit (INDEPENDENT_AMBULATORY_CARE_PROVIDER_SITE_OTHER): Payer: Medicare Other | Admitting: Family Medicine

## 2017-01-14 ENCOUNTER — Ambulatory Visit (INDEPENDENT_AMBULATORY_CARE_PROVIDER_SITE_OTHER): Payer: Medicare Other | Admitting: Orthopaedic Surgery

## 2017-01-14 ENCOUNTER — Encounter: Payer: Self-pay | Admitting: Family Medicine

## 2017-01-14 VITALS — BP 110/82 | HR 84 | Temp 98.1°F | Ht 68.0 in | Wt 264.2 lb

## 2017-01-14 DIAGNOSIS — J309 Allergic rhinitis, unspecified: Secondary | ICD-10-CM

## 2017-01-14 DIAGNOSIS — H66005 Acute suppurative otitis media without spontaneous rupture of ear drum, recurrent, left ear: Secondary | ICD-10-CM

## 2017-01-14 MED ORDER — CLINDAMYCIN HCL 300 MG PO CAPS: 300.0000 mg | ORAL_CAPSULE | Freq: Three times a day (TID) | ORAL | 0 refills | Status: DC

## 2017-01-14 NOTE — Progress Notes (Signed)
   Subjective:    Patient ID: [name] is a 36 y.o. male presenting with Otalgia  on 01/14/2017  HPI: Ear stuffiness and some ear pain. Pain is shooting to his scalp. Has noted cold and allergy issues lately. Takes some nose drops for this but does not remember the name. Denies dizziness or fever.  Review of Systems  Constitutional: Negative for chills and fever.  HENT: Positive for congestion, ear pain, rhinorrhea and sneezing. Negative for ear discharge.   Respiratory: Negative for shortness of breath.   Cardiovascular: Negative for leg swelling.  Gastrointestinal: Negative for abdominal pain, nausea and vomiting.      Objective:    BP 110/82   Pulse 84   Temp 98.1 F (36.7 C) (Oral)   Ht $R'5\' 8"'Jf$  (1.727 m)   Wt 264 lb 3.2 oz (119.8 kg)   SpO2 97%   BMI 40.17 kg/m  Physical Exam  Constitutional: He appears well-developed and well-nourished. No distress.  HENT:  Head: Normocephalic and atraumatic.  Right Ear: Tympanic membrane is not injected. No middle ear effusion.  Left Ear: Tympanic membrane is injected. A middle ear effusion is present.  Eyes: No scleral icterus.  Neck: Neck supple.  Cardiovascular: Normal rate.   Pulmonary/Chest: Effort normal.  Abdominal: Soft.  Musculoskeletal: He exhibits no edema.  Neurological: He is alert.  Skin: Skin is warm.  Psychiatric: He has a normal mood and affect.  Vitals reviewed.       Assessment & Plan:   Problem List Items Addressed This Visit      Unprioritized   Allergic rhinitis - Primary    Other Visit Diagnoses    Recurrent acute suppurative otitis media without spontaneous rupture of left tympanic membrane       will give short course Abx-   Relevant Medications   clindamycin (CLEOCIN) 300 MG capsule      Total face-to-face time with patient: 15 minutes. Over 50% of encounter was spent on counseling and coordination of care. Return if symptoms worsen or fail to improve.  Donnamae Jude 01/14/2017 4:16  PM

## 2017-01-14 NOTE — Patient Instructions (Addendum)
Otitis Media, Adult Otitis media is redness, soreness, and puffiness (swelling) in the space just behind your eardrum (middle ear). It may be caused by allergies or infection. It often happens along with a cold. Follow these instructions at home:  Take your medicine as told. Finish it even if you start to feel better.  Only take over-the-counter or prescription medicines for pain, discomfort, or fever as told by your doctor.  Follow up with your doctor as told. Contact a doctor if:  You have otitis media only in one ear, or bleeding from your nose, or both.  You notice a lump on your neck.  You are not getting better in 3-5 days.  You feel worse instead of better. Get help right away if:  You have pain that is not helped with medicine.  You have puffiness, redness, or pain around your ear.  You get a stiff neck.  You cannot move part of your face (paralysis).  You notice that the bone behind your ear hurts when you touch it. This information is not intended to replace advice given to you by your health care provider. Make sure you discuss any questions you have with your health care provider. Document Released: 03/24/2008 Document Revised: 03/13/2016 Document Reviewed: 05/03/2013 Elsevier Interactive Patient Education  2017 Elsevier Inc.  

## 2017-01-20 ENCOUNTER — Ambulatory Visit: Payer: Medicare Other | Attending: Orthopaedic Surgery | Admitting: Physical Therapy

## 2017-01-20 ENCOUNTER — Encounter: Payer: Self-pay | Admitting: Physical Therapy

## 2017-01-20 DIAGNOSIS — M542 Cervicalgia: Secondary | ICD-10-CM | POA: Insufficient documentation

## 2017-01-20 DIAGNOSIS — R293 Abnormal posture: Secondary | ICD-10-CM | POA: Insufficient documentation

## 2017-01-20 DIAGNOSIS — M62838 Other muscle spasm: Secondary | ICD-10-CM | POA: Diagnosis not present

## 2017-01-20 NOTE — Therapy (Signed)
[facility] [address] [place], [place], [address] Phone: [phone]   Fax:  [phone]  Physical Therapy Treatment / Discharge summary  Patient Details  Name: [name] MRN: [id number] Date of Birth: 31-Jan-1981 Referring Provider: Annell Greening MD  Encounter Date: 01/20/2017      PT End of Session - 01/20/17 1434    Visit Number 5   Number of Visits 13   Date for PT Re-Evaluation 01/20/17   PT Start Time 1427  pt arrived 12 minutes late today   PT Stop Time 1455  sortened visit due to discharge   PT Time Calculation (min) 28 min   Activity Tolerance Patient tolerated treatment well   Behavior During Therapy [facility] for tasks assessed/performed      Past Medical History:  Diagnosis Date  . Allergy   . Anxiety   . Becker's muscular dystrophy ([facility])   . Bipolar disorder ([facility])    On disability  . Depression   . Diabetes mellitus without complication ([facility])   . Headache(784.0)   . Hyperlipidemia   . Neuromuscular disorder ([facility])    Beckers muscular dystrophy  . Obesity   . Sleep apnea    Does not tolerate CPAP    Past Surgical History:  Procedure Laterality Date  . NM MYOCAR PERF WALL MOTION  01/22/2011   protocol: Persantine, moderate reversible inferior defect post stress EF 48%, high risk scan  . TRANSTHORACIC ECHOCARDIOGRAM  07/05/2004   EF=>55% normal study     There were no vitals filed for this visit.      Subjective Assessment - 01/20/17 1429    Subjective "I've been doing pretty good, no pain in the shoulder except for when someone landed on me"    Currently in Pain? No/denies            [facility] PT Assessment - 01/20/17 0001      Observation/Other Assessments   Focus on Therapeutic Outcomes (FOTO)  18% limited     AROM   Cervical Flexion 42   Cervical Extension 40   Cervical - Right Side Bend 32   Cervical - Left Side Bend 32   Cervical - Right Rotation 60   Cervical - Left Rotation 51                      [facility] Adult PT Treatment/Exercise - 01/20/17 0001      Shoulder Exercises: ROM/Strengthening   UBE (Upper Arm Bike) L2 x 8 min  changing direction at 4 min     Neck Exercises: Stretches   Upper Trapezius Stretch 2 reps;30 seconds                PT Education - 01/20/17 1449    Education provided Yes   Education Details reviewed previously provided HEP. How to continue to work on strengthening increasing reps/ sets to progress strength and endurance. conitnued importance of stretching.    Person(s) Educated Patient   Methods Explanation;Verbal cues   Comprehension Verbalized understanding;Verbal cues required          PT Short Term Goals - 01/20/17 1442      PT SHORT TERM GOAL #1   Title pt will be I with inital HEP (12/23/2016)   Time 3   Period Weeks   Status Achieved     PT SHORT TERM GOAL #2   Title pt will be able to verbalize and demo proper posture, lifting and carrying mechanics  to prevent and reduce neck pain (12/23/2016)   Time 3   Period Weeks   Status Achieved     PT SHORT TERM GOAL #3   Title pt will demonstrate decreased muscle spasm of bil upper traps and surrounding musculature to reduce pain to </=4/10 and promote cervical mobility (12/23/2016)   Time 3   Period Weeks   Status Achieved           PT Long Term Goals - 02-09-17 1443      PT LONG TERM GOAL #1   Title pt will be I with all HEP given as of last visit (09-Feb-2017)   Time 6   Period Weeks   Status Achieved     PT LONG TERM GOAL #2   Title pt will increase cervical mobility by >/= 15 degrees in all planes with </= 2/10 pain and no report of tingling in the hand to assist with safety during driving and ADLS (01/24/9628)   Time 6   Period Weeks   Status Achieved     PT LONG TERM GOAL #3   Title pt will be to lift and carry >/=20# from floor <>waist, and from waist to overhead with </= 2/10 pain to promote functional lifting and carrying and pt's personal  goals of wrestling (02-09-17)   Time 6   Period Weeks   Status Achieved     PT LONG TERM GOAL #4   Title pt will increase his FOTO score to </= 28% limited to demonstrate improvement in function (2017-02-09)   Time 6   Period Weeks   Status Achieved               Plan - 02-09-17 1450    Clinical Impression Statement [name] reports no pain today and hasn't had any pain in the last few weeks. He as demonstrated significant improvement in cervical mobility. He met all goals and is able to maintain and progress his current level of function independently and will be discharged from PT today.    PT Treatment/Interventions ADLs/Self Care Home Management;Cryotherapy;Electrical Stimulation;Iontophoresis '4mg'$ /ml Dexamethasone;Moist Heat;Traction;Ultrasound;Therapeutic activities;Therapeutic exercise;Dry needling;Taping;Manual techniques;Patient/family education   PT Next Visit Plan d/C   Consulted and Agree with Plan of Care Patient      Patient will benefit from skilled therapeutic intervention in order to improve the following deficits and impairments:  Pain, Improper body mechanics, Postural dysfunction, Decreased endurance, Decreased activity tolerance, Decreased strength, Difficulty walking, Increased muscle spasms, Increased fascial restricitons  Visit Diagnosis: Cervicalgia - Plan: PT plan of care cert/re-cert  Abnormal posture - Plan: PT plan of care cert/re-cert  Other muscle spasm - Plan: PT plan of care cert/re-cert       G-Codes - 02-09-17 1454    Functional Assessment Tool Used (Outpatient Only) FOTO/ clinical judgement   Functional Limitation Changing and maintaining body position   Changing and Maintaining Body Position Goal Status (B2841) At least 1 percent but less than 20 percent impaired, limited or restricted   Changing and Maintaining Body Position Discharge Status (L2440) At least 1 percent but less than 20 percent impaired, limited or restricted       Problem List Patient Active Problem List   Diagnosis Date Noted  . Neck pain 11/12/2016  . Back pain 10/25/2016  . Sleep difficulties 10/25/2016  . Allergic rhinitis 04/28/2016  . Dizzy 12/07/2015  . Type 2 diabetes mellitus without complication ([place]) 08/16/2535  . GERD (gastroesophageal reflux disease) 03/21/2013  . Headache 01/31/2013  . Gait  disorder 01/31/2013  . Routine adult health maintenance 07/25/2012  . Elevated BP 04/25/2012  . Narcotic abuse 06/05/2011  . OBESITY 10/17/2010  . 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 SLEEP APNEA 01/24/2009  . BIPOLAR DISORDER UNSPECIFIED 05/05/2008  . HYPERLIPIDEMIA 03/30/2008  . ABUSE, ALCOHOL, UNSPECIFIED 04/26/2007  . TOBACCO ABUSE 12/25/2006   Starr Lake PT, DPT, LAT, ATC  01/20/17  2:55 PM      [facility] [address] [place], [place], [address] Phone: [phone]   Fax:  [phone]  Name: [name] MRN: [id number] Date of Birth: 02-Sep-1981     PHYSICAL THERAPY DISCHARGE SUMMARY  Visits from Start of Care: 5  Current functional level related to goals / functional outcomes: FOTO 18% limited   Remaining deficits: Intermittent tightness in the L upper trap.    Education / Equipment: HEP, posture, theraband  Plan: Patient agrees to discharge.  Patient goals were partially met. Patient is being discharged due to meeting the stated rehab goals.  ?????    Elvi Leventhal PT, DPT, LAT, ATC  01/20/17  2:56 PM

## 2017-04-11 ENCOUNTER — Other Ambulatory Visit: Payer: Self-pay | Admitting: Internal Medicine

## 2017-04-11 DIAGNOSIS — K219 Gastro-esophageal reflux disease without esophagitis: Secondary | ICD-10-CM

## 2017-05-07 ENCOUNTER — Encounter: Payer: Self-pay | Admitting: Internal Medicine

## 2017-05-07 ENCOUNTER — Ambulatory Visit (INDEPENDENT_AMBULATORY_CARE_PROVIDER_SITE_OTHER): Payer: Medicare Other | Admitting: Internal Medicine

## 2017-05-07 VITALS — BP 122/72 | HR 101 | Temp 98.7°F | Wt 266.2 lb

## 2017-05-07 DIAGNOSIS — R21 Rash and other nonspecific skin eruption: Secondary | ICD-10-CM

## 2017-05-07 MED ORDER — HYDROCORTISONE 1 % EX OINT: 1.0000 "application " | TOPICAL_OINTMENT | Freq: Two times a day (BID) | CUTANEOUS | 0 refills | Status: DC

## 2017-05-07 NOTE — Patient Instructions (Addendum)
[name],  You have a papular itchy rash that could be a contact dermatitis or reaction to bites.  Try hyrdocortisone cream 1% twice daily and calamine lotion as needed.  If other people in your household get the same rash or if it does not improve, please see me back as I would want to rule-out scabies.  Best, Dr. Ola Spurr   Scabies, Adult Scabies is a skin condition that happens when very small insects get under the skin (infestation). This causes a rash and severe itchiness. Scabies can spread from person to person (is contagious). If you get scabies, it is common for others in your household to get scabies too. With proper treatment, symptoms usually go away in 2-4 weeks. Scabies usually does not cause lasting problems. What are the causes? This condition is caused by mites (Sarcoptes scabiei, or human itch mites) that can only be seen with a microscope. The mites get into the top layer of skin and lay eggs. Scabies can spread from person to person through:  Close contact with a person who has scabies.  Contact with infested items, such as towels, bedding, or clothing.  What increases the risk? This condition is more likely to develop in:  People who live in nursing homes and other extended-care facilities.  People who have sexual contact with a partner who has scabies.  Young children who attend child care facilities.  People who care for others who are at increased risk for scabies.  What are the signs or symptoms? Symptoms of this condition may include:  Severe itchiness. This is often worse at night.  A rash that includes tiny red bumps or blisters. The rash commonly occurs on the wrist, elbow, armpit, fingers, waist, groin, or buttocks. Bumps may form a line (burrow) in some areas.  Skin irritation. This can include scaly patches or sores.  How is this diagnosed? This condition is diagnosed with a physical exam. Your health care provider will look closely at your  skin. In some cases, your health care provider may take a sample of your affected skin (skin scraping) and have it examined under a microscope. How is this treated? This condition may be treated with:  Medicated cream or lotion that kills the mites. This is spread on the entire body and left on for several hours. Usually, one treatment with medicated cream or lotion is enough to kill all of the mites. In severe cases, the treatment may be repeated.  Medicated cream that relieves itching.  Medicines that help to relieve itching.  Medicines that kill the mites. This treatment is rarely used.  Follow these instructions at home:  Medicines  Take or apply over-the-counter and prescription medicines as told by your health care provider.  Apply medicated cream or lotion as told by your health care provider.  Do not wash off the medicated cream or lotion until the necessary amount of time has passed. Skin Care  Avoid scratching your affected skin.  Keep your fingernails closely trimmed to reduce injury from scratching.  Take cool baths or apply cool washcloths to help reduce itching. General instructions  Clean all items that you recently had contact with, including bedding, clothing, and furniture. Do this on the same day that your treatment starts. ? Use hot water when you wash items. ? Place unwashable items into closed, airtight plastic bags for at least 3 days. The mites cannot live for more than 3 days away from human skin. ? Vacuum furniture and mattresses that you  use.  Make sure that other people who may have been infested are examined by a health care provider. These include members of your household and anyone who may have had contact with infested items.  Keep all follow-up visits as told by your health care provider. This is important. Contact a health care provider if:  You have itching that does not go away after 4 weeks of treatment.  You continue to develop new  bumps or burrows.  You have redness, swelling, or pain in your rash area after treatment.  You have fluid, blood, or pus coming from your rash. This information is not intended to replace advice given to you by your health care provider. Make sure you discuss any questions you have with your health care provider. Document Released: 06/27/2015 Document Revised: 03/13/2016 Document Reviewed: 05/08/2015 Elsevier Interactive Patient Education  [employer].

## 2017-05-07 NOTE — Progress Notes (Signed)
[name] Progress Note  Subjective:  [name] is a 36 y.o. male with history of bipolar disorder, T2DM, and obesity who presents for rash.  #Rash: - present since yesterday - very itchy - started near R elbow and spread towards wrist. Also has area on R thigh. Has not spread any further today. - has not tried anything for it - lives by himself, no pets - no changes in soaps or detergents. Has not been outside or doing any gardening or yard work. 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  ROS: No shortness of breath, no pain/tingling  Allergies  Allergen Reactions  . Penicillins Shortness Of Breath  . Metformin And Related Other (See Comments)    Stomach cramps    ROS: smokes tobacco  Objective: Blood pressure 122/72, pulse (!) 101, temperature 98.7 F (37.1 C), temperature source Oral, weight 266 lb 3.2 oz (120.7 kg), SpO2 96 %.  Body mass index is 40.48 kg/m. Constitutional: Well-appearing male in NAD HENT: MMM. Normal posterior oropharynx.  Cardiovascular: RRR, S1, S2, no m/r/g.  Pulmonary/Chest: Effort normal and breath sounds normal. No respiratory distress.  Skin: Erythematous papular rash across flexural surface of R forearm and R anterior thigh. Few papules across dorsum of hand but none between webs of fingers. No vesicles.  Psychiatric: Flat affect.  Vitals reviewed          Assessment/Plan: Rash and nonspecific skin eruption - Appears consistent with a contact dermatitis vs bug bites but no known exposures. Given location, cannot rule-out scabies, though no burrows noted. - Recommended hydrocortisone 1% cream and calamine lotion to help with itching. - Advised him to call clinic if not improving over next few days, as would empirically treat for scabies.   Follow-up prn.  Olene Floss, MD [place], PGY-3

## 2017-05-10 DIAGNOSIS — R21 Rash and other nonspecific skin eruption: Secondary | ICD-10-CM | POA: Insufficient documentation

## 2017-05-10 NOTE — Assessment & Plan Note (Signed)
-   Appears consistent with a contact dermatitis vs bug bites but no known exposures. Given location, cannot rule-out scabies, though no burrows noted. - Recommended hydrocortisone 1% cream and calamine lotion to help with itching. - Advised him to call clinic if not improving over next few days, as would empirically treat for scabies.

## 2017-07-28 ENCOUNTER — Ambulatory Visit (INDEPENDENT_AMBULATORY_CARE_PROVIDER_SITE_OTHER): Payer: Medicare Other | Admitting: *Deleted

## 2017-07-28 DIAGNOSIS — Z23 Encounter for immunization: Secondary | ICD-10-CM

## 2017-12-31 ENCOUNTER — Ambulatory Visit (INDEPENDENT_AMBULATORY_CARE_PROVIDER_SITE_OTHER): Payer: Medicare Other | Admitting: Internal Medicine

## 2017-12-31 ENCOUNTER — Encounter: Payer: Self-pay | Admitting: Internal Medicine

## 2017-12-31 ENCOUNTER — Other Ambulatory Visit: Payer: Self-pay

## 2017-12-31 VITALS — BP 114/66 | HR 92 | Temp 98.2°F | Ht 68.0 in | Wt 251.6 lb

## 2017-12-31 DIAGNOSIS — M542 Cervicalgia: Secondary | ICD-10-CM

## 2017-12-31 DIAGNOSIS — S139XXA Sprain of joints and ligaments of unspecified parts of neck, initial encounter: Secondary | ICD-10-CM

## 2017-12-31 MED ORDER — PREDNISONE 10 MG (21) PO TBPK: ORAL_TABLET | ORAL | 0 refills | Status: DC

## 2017-12-31 MED ORDER — MELOXICAM 15 MG PO TABS: 15.0000 mg | ORAL_TABLET | Freq: Every day | ORAL | 0 refills | Status: DC

## 2017-12-31 NOTE — Patient Instructions (Signed)
Mr. 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  Take prednisone for the next 6 days and the antiinflammatory meloxicam 15 mg daily for the next week. This should help reduce some of the inflammation causing pain.  I will also refer you to physical therapy. You should get a call about this within a week.  Please see me back if you have no improvement.  Best, Dr. Ola Spurr

## 2018-01-01 ENCOUNTER — Encounter: Payer: Self-pay | Admitting: Internal Medicine

## 2018-01-01 NOTE — Progress Notes (Signed)
[name] Progress Note  Subjective:  [name] is a 37 y.o. male with history of T2DM, bipolar disorder, obesity, narcotic abuse (clean since 2012), tobacco abuse, and spondylosis who presents for upper back and neck pain. He reports pain for last 2-3 weeks with radiation of pain into upper arms and some tingling into hands. It hurts to turn his head and feels his muscles are very tight. Has tried tylenol and ibuprofen without improvement. He last did PT at the beginning of last year after evaluation by Orthopedic Surgeon Dr. Lorin Mercy who felt patient had neck sprain after wrestling injury in December 2017. Patient says this helped at the time but therapy was more for shoulder pain. Patient continues to wrestle. Patient plans to wrestle this weekend, though his wife (present during visit) has been encouraging him to quit this for risk of neck injury. 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 Patient's trainer felt he should be seen by a doctor. Patient has been avoiding coming in because he does not want to be told not to wrestle. Has tried muscle relaxants before and did not like because they made him groggy. ROS: Denies weakness. No falls. No known injury.   Allergies  Allergen Reactions  . Penicillins Shortness Of Breath  . Metformin And Related Other (See Comments)    Stomach cramps     Social History   Tobacco Use  . Smoking status: Current Every Day Smoker    Packs/day: 1.00    Years: 21.00    Pack years: 21.00    Types: Cigarettes  . Smokeless tobacco: Never Used  . Tobacco comment: Reports using vapor cigarettes currently trying to quit  Substance Use Topics  . Alcohol use: No    Alcohol/week: 0.0 oz    Comment: quit 2013   Cervical spine x-ray 10/2016: "loss of normal cervical lordosis with a straight cervical spinal lateral x-ray. Uncovertebral changes seen on AP  x-ray) C5-6 with the disc space narrowing and spurring noted on lateral without spondylolisthesis"  Objective: Blood pressure 114/66, pulse 92, temperature 98.2 F (36.8 C), temperature source Oral, height $RemoveBefo'5\' 8"'AsRmBZLKULq$  (1.727 m), weight 251 lb 9.6 oz (114.1 kg), SpO2 96 %. Body mass index is 38.26 kg/m. Constitutional: Obese male, pleasant in NAD HENT: Poor dentition Cardiovascular: RRR, S1, S2, no m/r/g.  Pulmonary/Chest: Effort normal and breath sounds normal.  Musculoskeletal: TTP over upper trapezius bilaterally and over paraspinal muscles of thoracic spine. No midline spinal TTP. Lateral rotation of neck restricted to about 45 degrees on right and 60 degrees on left with patient reporting sensation of tightness. Normal ROM with flexion and extension of neck though patient reports discomfort.  Neurological: AOx3, no focal deficits. Peripheral sensation intact over upper extremities. Ulnar and radial strength normal. Grip strength 5/5 bilaterally. UE strength 5/5.  Skin: Skin is warm and dry. No rash noted.  Psychiatric: Normal mood and affect.  Vitals reviewed  Assessment/Plan: Neck pain - Exam consistent with muscle spasm in setting of neck strain. May be secondary to nerve root irritation. No midline spinal TTP to suggest disc rupture. Strength and sensation intact.  - Offered toradol and depomedrol IM injection but patient would prefer PO treatment. Ordered short prednisone taper and meloxicam.  - Patient interested in PT. Placed referral. - Advised patient that wrestling is a very dangerous hobby and that I would recommend discontinuing this.  - Taking  gabapentin for migraines, may have secondary benefit for nerve irritation.  Follow-up if no improvement and for general check-up at his convenience.   Olene Floss, MD [place], PGY-3

## 2018-01-01 NOTE — Assessment & Plan Note (Signed)
-   Exam consistent with muscle spasm in setting of neck strain. May be secondary to nerve root irritation. No midline spinal TTP to suggest disc rupture. Strength and sensation intact.  - Offered toradol and depomedrol IM injection but patient would prefer PO treatment. Ordered short prednisone taper and meloxicam.  - Patient interested in PT. Placed referral. - Advised patient that wrestling is a very dangerous hobby and that I would recommend discontinuing this.  - Taking gabapentin for migraines, may have secondary benefit for nerve irritation.

## 2018-01-15 ENCOUNTER — Other Ambulatory Visit: Payer: Self-pay | Admitting: Internal Medicine

## 2018-01-15 DIAGNOSIS — K219 Gastro-esophageal reflux disease without esophagitis: Secondary | ICD-10-CM

## 2018-01-28 ENCOUNTER — Ambulatory Visit (INDEPENDENT_AMBULATORY_CARE_PROVIDER_SITE_OTHER): Payer: Medicare Other | Admitting: Internal Medicine

## 2018-01-28 ENCOUNTER — Encounter: Payer: Self-pay | Admitting: Internal Medicine

## 2018-01-28 ENCOUNTER — Other Ambulatory Visit: Payer: Self-pay

## 2018-01-28 VITALS — BP 118/68 | HR 77 | Temp 98.3°F | Ht 68.0 in | Wt 251.6 lb

## 2018-01-28 DIAGNOSIS — R739 Hyperglycemia, unspecified: Secondary | ICD-10-CM

## 2018-01-28 DIAGNOSIS — Z5181 Encounter for therapeutic drug level monitoring: Secondary | ICD-10-CM

## 2018-01-28 DIAGNOSIS — E785 Hyperlipidemia, unspecified: Secondary | ICD-10-CM | POA: Diagnosis not present

## 2018-01-28 DIAGNOSIS — N529 Male erectile dysfunction, unspecified: Secondary | ICD-10-CM | POA: Diagnosis not present

## 2018-01-28 LAB — POCT GLYCOSYLATED HEMOGLOBIN (HGB A1C): HEMOGLOBIN A1C: 6.3

## 2018-01-28 MED ORDER — SILDENAFIL CITRATE 50 MG PO TABS: 50.0000 mg | ORAL_TABLET | Freq: Every day | ORAL | 1 refills | Status: DC | PRN

## 2018-01-28 NOTE — Patient Instructions (Signed)
Mr. Gurney,  Please work on staying physically active. It is recommended to get moderate exercise for 150 minutes a week. Activities like biking and swimming may be easier on your knee.  Try viagra 1 hour before sexual intercourse for days surrounding ovulation.   Cutting back on smoking (ultimately quitting) and maintaining a healthy weight (goal weight around 160 lbs) helps with sexual function. I will let you know how your blood sugar looks, as high sugars can cause erectile dysfunction.  Best, Dr. Ola Spurr

## 2018-01-29 ENCOUNTER — Encounter: Payer: Self-pay | Admitting: Internal Medicine

## 2018-01-29 DIAGNOSIS — N529 Male erectile dysfunction, unspecified: Secondary | ICD-10-CM | POA: Insufficient documentation

## 2018-01-29 LAB — COMPREHENSIVE METABOLIC PANEL
ALK PHOS: 93 IU/L (ref 39–117)
ALT: 30 IU/L (ref 0–44)
AST: 29 IU/L (ref 0–40)
Albumin/Globulin Ratio: 1.5 (ref 1.2–2.2)
Albumin: 4.4 g/dL (ref 3.5–5.5)
BUN/Creatinine Ratio: 10 (ref 9–20)
BUN: 10 mg/dL (ref 6–20)
Bilirubin Total: <0.2 mg/dL (ref 0.0–1.2)
CO2: 24 mmol/L (ref 20–29)
CREATININE: 0.97 mg/dL (ref 0.76–1.27)
Calcium: 9.1 mg/dL (ref 8.7–10.2)
Chloride: 100 mmol/L (ref 96–106)
GFR calc Af Amer: 116 mL/min/{1.73_m2} (ref >59)
GFR calc non Af Amer: 100 mL/min/{1.73_m2} (ref >59)
GLOBULIN, TOTAL: 3 g/dL (ref 1.5–4.5)
Glucose: 95 mg/dL (ref 65–99)
POTASSIUM: 4.5 mmol/L (ref 3.5–5.2)
SODIUM: 139 mmol/L (ref 134–144)
Total Protein: 7.4 g/dL (ref 6.0–8.5)

## 2018-01-29 LAB — LIPID PANEL
CHOL/HDL RATIO: 8.3 ratio — AB (ref 0.0–5.0)
Cholesterol, Total: 225 mg/dL — ABNORMAL HIGH (ref 100–199)
HDL: 27 mg/dL — ABNORMAL LOW (ref >39)
LDL CALC: 169 mg/dL — AB (ref 0–99)
Triglycerides: 144 mg/dL (ref 0–149)
VLDL CHOLESTEROL CAL: 29 mg/dL (ref 5–40)

## 2018-01-29 MED ORDER — ATORVASTATIN CALCIUM 40 MG PO TABS
40.0000 mg | ORAL_TABLET | Freq: Every day | ORAL | 1 refills | Status: DC
Start: 2018-01-29 — End: 2018-09-10

## 2018-01-29 NOTE — Progress Notes (Signed)
[name] Progress Note  Subjective:  [name] is a 37 y.o. male with history of bipolar disorder, tobacco abuse, HLD, prediabetes who presents for "personal issue." Patient reports trouble maintaining an erection over the last month. He denies problems prior to this. He is newly married, and he and his wife would like to have a baby. He says he does not consistently have trouble maintaining an erection but when it happens it is not because he is not in the mood. He denies feeling pressure with desire to have another child. He reports good stream and no change in urine stream strength. He denies hair loss or change in weight. 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 He continues to smoke 2 PPD. He notes a barrier to cessation is his mother's smoking. A goal is to cut back to 1.5 ppd. Does not drink EtOH. Denies changes in mood. Still taking latuda and lamictal. ROS: Increased urination, no dysuria or abdominal pain. Denies loss of peripheral sensation.   Allergies  Allergen Reactions  . Penicillins Shortness Of Breath  . Metformin And Related Other (See Comments)    Stomach cramps     Social History   Tobacco Use  . Smoking status: Current Every Day Smoker    Packs/day: 1.00    Years: 21.00    Pack years: 21.00    Types: Cigarettes  . Smokeless tobacco: Never Used  . Tobacco comment: Reports using vapor cigarettes currently trying to quit  Substance Use Topics  . Alcohol use: No    Alcohol/week: 0.0 oz    Comment: quit 2013    Objective: Blood pressure 118/68, pulse 77, temperature 98.3 F (36.8 C), temperature source Oral, height $RemoveBefo'5\' 8"'EFmVCnjBlir$  (1.727 m), weight 251 lb 9.6 oz (114.1 kg), SpO2 98 %. Body mass index is 38.26 kg/m. Constitutional: Obese male in NAD Cardiovascular: RRR, S1, S2, no m/r/g.  Pulmonary/Chest: Effort normal and breath sounds  normal.  Musculoskeletal: No LE edema Neurological: AOx3, no focal deficits. Skin: Skin is warm and dry. No rash noted. Facial hair present.  Psychiatric: Normal mood and affect.  Vitals reviewed  Assessment/Plan: Erectile dysfunction - Likely indication of reduced cardiovascular health with risk factors of obesity, history of HLD, tobacco abuse, and elevated blood sugars. Precepted with Dr. Erin Hearing and do not think it would be high yield to check testosterone level at this time. - Recommended lifestyle modification (looking at portion sizes and finding an activity like biking or swimming that does not irritate knees) for weight loss and reviewed BMI chart to start goal setting (recommended short-term goal of 20 lb weight loss) - Will check lipid panel, CMP, and A1c (unfortunately cannot tolerate metformin) - Prescribed viagra 50 mg to try 1 hour before sex; recommended using strategically around times of ovulation and then prn otherwise; provided coupon if not covered by insurance  Follow-up to discuss smoking cessation aids when ready.   Olene Floss, MD [place], PGY-3

## 2018-01-29 NOTE — Assessment & Plan Note (Signed)
-   Likely indication of reduced cardiovascular health with risk factors of obesity, history of HLD, tobacco abuse, and elevated blood sugars. Precepted with Dr. Erin Hearing and do not think it would be high yield to check testosterone level at this time. - Recommended lifestyle modification (looking at portion sizes and finding an activity like biking or swimming that does not irritate knees) for weight loss and reviewed BMI chart to start goal setting (recommended short-term goal of 20 lb weight loss) - Will check lipid panel, CMP, and A1c (unfortunately cannot tolerate metformin) - Prescribed viagra 50 mg to try 1 hour before sex; recommended using strategically around times of ovulation and then prn otherwise; provided coupon if not covered by insurance

## 2018-04-16 ENCOUNTER — Other Ambulatory Visit: Payer: Self-pay | Admitting: Internal Medicine

## 2018-04-16 DIAGNOSIS — K219 Gastro-esophageal reflux disease without esophagitis: Secondary | ICD-10-CM

## 2018-05-07 DIAGNOSIS — M2391 Unspecified internal derangement of right knee: Secondary | ICD-10-CM | POA: Diagnosis not present

## 2018-05-07 DIAGNOSIS — M25561 Pain in right knee: Secondary | ICD-10-CM | POA: Diagnosis not present

## 2018-06-16 DIAGNOSIS — Y9269 Other specified industrial and construction area as the place of occurrence of the external cause: Secondary | ICD-10-CM | POA: Diagnosis not present

## 2018-06-16 DIAGNOSIS — G44319 Acute post-traumatic headache, not intractable: Secondary | ICD-10-CM | POA: Diagnosis not present

## 2018-06-16 DIAGNOSIS — W228XXA Striking against or struck by other objects, initial encounter: Secondary | ICD-10-CM | POA: Diagnosis not present

## 2018-06-16 DIAGNOSIS — S0990XA Unspecified injury of head, initial encounter: Secondary | ICD-10-CM | POA: Diagnosis not present

## 2018-07-05 ENCOUNTER — Telehealth: Payer: Self-pay | Admitting: Family Medicine

## 2018-07-05 NOTE — Telephone Encounter (Signed)
Left message for pt to inquire about health maintenance appt.

## 2018-07-07 ENCOUNTER — Ambulatory Visit: Payer: Self-pay

## 2018-07-11 ENCOUNTER — Other Ambulatory Visit: Payer: Self-pay | Admitting: Internal Medicine

## 2018-07-11 DIAGNOSIS — K219 Gastro-esophageal reflux disease without esophagitis: Secondary | ICD-10-CM

## 2018-08-06 DIAGNOSIS — Z79899 Other long term (current) drug therapy: Secondary | ICD-10-CM | POA: Diagnosis not present

## 2018-08-06 DIAGNOSIS — M779 Enthesopathy, unspecified: Secondary | ICD-10-CM | POA: Diagnosis not present

## 2018-08-06 DIAGNOSIS — E119 Type 2 diabetes mellitus without complications: Secondary | ICD-10-CM | POA: Diagnosis not present

## 2018-08-06 DIAGNOSIS — M7751 Other enthesopathy of right foot: Secondary | ICD-10-CM | POA: Diagnosis not present

## 2018-08-06 DIAGNOSIS — M79671 Pain in right foot: Secondary | ICD-10-CM | POA: Diagnosis not present

## 2018-08-09 ENCOUNTER — Encounter: Payer: Medicare Other | Admitting: Family Medicine

## 2018-09-10 ENCOUNTER — Encounter: Payer: Self-pay | Admitting: Family Medicine

## 2018-09-10 ENCOUNTER — Ambulatory Visit (INDEPENDENT_AMBULATORY_CARE_PROVIDER_SITE_OTHER): Payer: Medicare Other | Admitting: Family Medicine

## 2018-09-10 ENCOUNTER — Other Ambulatory Visit: Payer: Self-pay

## 2018-09-10 VITALS — BP 110/62 | HR 88 | Temp 98.7°F | Ht 68.0 in | Wt 255.8 lb

## 2018-09-10 DIAGNOSIS — R300 Dysuria: Secondary | ICD-10-CM | POA: Diagnosis not present

## 2018-09-10 DIAGNOSIS — E119 Type 2 diabetes mellitus without complications: Secondary | ICD-10-CM

## 2018-09-10 DIAGNOSIS — E785 Hyperlipidemia, unspecified: Secondary | ICD-10-CM

## 2018-09-10 LAB — POCT URINALYSIS DIP (MANUAL ENTRY)
Bilirubin, UA: NEGATIVE
Blood, UA: NEGATIVE
GLUCOSE UA: NEGATIVE mg/dL
Ketones, POC UA: NEGATIVE mg/dL
NITRITE UA: NEGATIVE
SPEC GRAV UA: 1.025 (ref 1.010–1.025)
UROBILINOGEN UA: 2 U/dL — AB
pH, UA: 7 (ref 5.0–8.0)

## 2018-09-10 LAB — POCT GLYCOSYLATED HEMOGLOBIN (HGB A1C): HbA1c, POC (controlled diabetic range): 6.1 % (ref 0.0–7.0)

## 2018-09-10 MED ORDER — ATORVASTATIN CALCIUM 40 MG PO TABS: 40.0000 mg | ORAL_TABLET | Freq: Every day | ORAL | 1 refills | Status: DC

## 2018-09-10 NOTE — Assessment & Plan Note (Signed)
Dysuria and urgency, frequency x several weeks duration, patient able to provide a urine sample for UA today.  No penile discharge or drainage reported.  No systemic symptoms. -check UA for infection

## 2018-09-10 NOTE — Assessment & Plan Note (Signed)
Last check A1c 01/2018 was 6.3.  Repeat A1c today is stable at 6.1. Patient not on medications at this time, patient states he eats a lot of sweets and drinks monster energy drinks.  Does not tolerate water as this gives him abdominal pain. -Due for urine microalbumin, will check at this visit -Foot exam performed today, no abnormalities noted

## 2018-09-10 NOTE — Assessment & Plan Note (Signed)
Patient states he never picked up his Lipitor from the pharmacy and has not been taking it. -Rx: Lipitor 40 mg daily ordered

## 2018-09-10 NOTE — Progress Notes (Signed)
   Subjective:   Patient ID: [name]    DOB: November 17, 1980, 37 y.o. male   MRN: [id number]  CC: f/u T2DM   HPI: [name] is a 37 y.o. male who presents to clinic today for the following issue.    T2DM  Feels this is well controlled.  Medications: none   Diet - eats once or twice a day, feels like he does not eat much, eats mainly pizza and other fast food, chinese takeouts, mostly drinks Monster energy  Exercise - walks about 4 miles at work, he works as a Dispensing optician exam- few weeks ago  Foot exam-performed today, no abnormalities noted   Dysuria  Describes burning with urination over the last several weeks.   Is peeing more often, has not noticed an odor.   Does not drink a lot of water.   Health maintenance -due for flu shot, got it ~Sept at [facility] -urine microalbumin today   ROS: No fevers, chills, nausea, vomiting.  No abdominal pain.   Social: reports daily tobacco use  Medications reviewed. Objective:   BP 110/62   Pulse 88   Temp 98.7 F (37.1 C) (Oral)   Ht $R'5\' 8"'Iq$  (1.727 m)   Wt 255 lb 12.8 oz (116 kg)   SpO2 97%   BMI 38.89 kg/m  Vitals and nursing note reviewed.  General: 37 year old male, NAD HEENT: NCAT, EOMI, PERRL, MMM, oropharynx clear Neck: supple CV: RRR no MRG  Lungs: CTAB, normal effort  Abdomen: soft, NTND, no suprapubic tenderness +bs   Skin: warm, dry, no rash Extremities: warm and well perfused  Assessment & Plan:   Type 2 diabetes mellitus without complication Last check N8G 01/2018 was 6.3.  Repeat A1c today is stable at 6.1. Patient not on medications at this time, patient states he eats a lot of sweets and drinks monster energy drinks.  Does not tolerate water as this gives him abdominal pain. -Due for urine microalbumin, will check at this visit -Foot exam performed today, no abnormalities noted  Dysuria Dysuria and urgency, frequency x several weeks duration, patient able to provide a urine sample for UA today.  No  penile discharge or drainage reported.  No systemic symptoms. -check UA for infection   HYPERLIPIDEMIA Patient states he never picked up his Lipitor from the pharmacy and has not been taking it. -Rx: Lipitor 40 mg daily ordered  Orders Placed This Encounter  Procedures  . Microalbumin/Creatinine Ratio, Urine  . POCT glycosylated hemoglobin (Hb A1C)  . POCT urinalysis dipstick   Meds ordered this encounter  Medications  . atorvastatin (LIPITOR) 40 MG tablet    Sig: Take 1 tablet (40 mg total) by mouth daily.    Dispense:  90 tablet    Refill:  1   Lovenia Kim, MD [place]

## 2018-09-10 NOTE — Patient Instructions (Addendum)
It was nice meeting you today.  You were seen in clinic for routine physical as well as follow-up on your diabetes and health maintenance.  We checked your hemoglobin A1c today.   Additionally, we tested your urine for possible infection as you have been having some burning with urination and frequency over the last several weeks.  I will call you once I have the results of your tests.  Please call clinic if you have any questions.  Lovenia Kim MD

## 2018-09-11 LAB — MICROALBUMIN / CREATININE URINE RATIO
Creatinine, Urine: 202.4 mg/dL
MICROALB/CREAT RATIO: 25.7 mg/g{creat} (ref 0.0–30.0)
Microalbumin, Urine: 52.1 ug/mL

## 2018-10-03 NOTE — Progress Notes (Signed)
  Subjective:   Patient ID: [name]    DOB: 1981/05/23, 37 y.o. male   MRN: [id number]  [name] is a 37 y.o. male with a history of sleep apnea, AR, GERD, T2DM, muscular dystrophy, HLD, Bipolar d/o, tobacco use, alcohol use, obesity, ED here for   Memory problems Patient endorses problems with memory for the past 10-11 years when he discontinued illicit drug use and went through rehab at a behavioral center.  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  His wife is also present at the visit and corroborates his claims.  Denies any known family history of dementia or memory issues.  Takes Latuda and Lamictal for mood stabilization for the past few years without noticed worsening of memory issues.  Follows with Dr. Adele Schilder at the [facility].  Denies being down, depressed, hopeless, SI.  Occasionally will get headaches when he forgets things.  Review of Systems:  Per HPI.  [place], medications and smoking status reviewed.  Objective:   BP 140/88   Temp 98 F (36.7 C) (Oral)   Ht $R'5\' 8"'IP$  (1.727 m)   Wt 257 lb (116.6 kg)   BMI 39.08 kg/m  Vitals and nursing note reviewed.  General: obese male, in no acute distress with non-toxic appearance HEENT: normocephalic, atraumatic, moist mucous membranes Neck: supple, non-tender without lymphadenopathy CV: regular rate and rhythm without murmurs, rubs, or gallops Lungs: clear to auscultation bilaterally with normal work of breathing Abdomen: soft, non-tender, normoactive bowel sounds Skin: warm, dry, no rashes or lesions Extremities: warm and well perfused, normal tone MSK: Full ROM, strength 5/5 to U/LE bilaterally, normal gait.   Neuro:  Alert and oriented, speech normal.  Optic field normal. PERRL, Extraocular movements intact.  Intact symmetric sensation to light touch of face bilaterally.  Hearing grossly intact bilaterally.  Tongue protrudes normally with no deviation.  Shoulder shrug, smile symmetric.  Assessment & Plan:   Memory difficulty Unclear etiology.  Neuro exam within normal limits today.  [place] 19/30 today with difficulties in attention and recall.  History of illicit drug use, reports continued discontinuation without recent relapses.  Is on medications that could affect neurocognitive status, however has experienced symptoms prior to initiation of these medications so less likely, however would encourage close follow-up with psychiatrist.  Will obtain imaging to rule out infectious process with HIV, RPR, CBC with differential, and check electrolytes.  If labs return normal with persistent symptoms, would consider referral for more extensive neurocognitive testing at that time.  Orders Placed This Encounter  Procedures  . HIV antibody (with reflex)  . RPR  . Comprehensive metabolic panel    Order Specific Question:   Has the patient fasted?    Answer:   No  . CBC with Differential  . TSH  . T4, Free   No orders of the defined types were placed in this encounter.   Rory Percy, DO PGY-2, [facility] 10/04/2018 6:24 PM

## 2018-10-04 ENCOUNTER — Encounter: Payer: Self-pay | Admitting: Family Medicine

## 2018-10-04 ENCOUNTER — Ambulatory Visit (INDEPENDENT_AMBULATORY_CARE_PROVIDER_SITE_OTHER): Payer: Medicare Other | Admitting: Family Medicine

## 2018-10-04 ENCOUNTER — Other Ambulatory Visit: Payer: Self-pay

## 2018-10-04 VITALS — BP 140/88 | Temp 98.0°F | Ht 68.0 in | Wt 257.0 lb

## 2018-10-04 DIAGNOSIS — Z113 Encounter for screening for infections with a predominantly sexual mode of transmission: Secondary | ICD-10-CM

## 2018-10-04 DIAGNOSIS — R413 Other amnesia: Secondary | ICD-10-CM

## 2018-10-04 NOTE — Patient Instructions (Signed)
It was great to see you!  Our plans for today:  - We are checking some labs today, we will call you or send you a letter if they are abnormal.  - Follow up in a few weeks with your primary doctor. At that time, if you are still having symptoms, we may consider referral.  Take care and seek immediate care sooner if you develop any concerns.   Dr. [facility]

## 2018-10-04 NOTE — Assessment & Plan Note (Signed)
Unclear etiology.  Neuro exam within normal limits today.  Moca 19/30 today with difficulties in attention and recall.  History of illicit drug use, reports continued discontinuation without recent relapses.  Is on medications that could affect neurocognitive status, however has experienced symptoms prior to initiation of these medications so less likely, however would encourage close follow-up with psychiatrist.  Will obtain imaging to rule out infectious process with HIV, RPR, CBC with differential, and check electrolytes.  If labs return normal with persistent symptoms, would consider referral for more extensive neurocognitive testing at that time.

## 2018-10-05 ENCOUNTER — Encounter: Payer: Self-pay | Admitting: Family Medicine

## 2018-10-05 LAB — CBC WITH DIFFERENTIAL/PLATELET
Basophils Absolute: 0.1 10*3/uL (ref 0.0–0.2)
Basos: 1 %
EOS (ABSOLUTE): 0.3 10*3/uL (ref 0.0–0.4)
EOS: 3 %
HEMATOCRIT: 43 % (ref 37.5–51.0)
HEMOGLOBIN: 14.9 g/dL (ref 13.0–17.7)
IMMATURE GRANS (ABS): 0 10*3/uL (ref 0.0–0.1)
Immature Granulocytes: 0 %
LYMPHS ABS: 2.8 10*3/uL (ref 0.7–3.1)
Lymphs: 30 %
MCH: 29.4 pg (ref 26.6–33.0)
MCHC: 34.7 g/dL (ref 31.5–35.7)
MCV: 85 fL (ref 79–97)
MONOCYTES: 7 %
Monocytes Absolute: 0.6 10*3/uL (ref 0.1–0.9)
Neutrophils Absolute: 5.5 10*3/uL (ref 1.4–7.0)
Neutrophils: 59 %
Platelets: 241 10*3/uL (ref 150–450)
RBC: 5.06 x10E6/uL (ref 4.14–5.80)
RDW: 13.2 % (ref 12.3–15.4)
WBC: 9.4 10*3/uL (ref 3.4–10.8)

## 2018-10-05 LAB — COMPREHENSIVE METABOLIC PANEL
ALBUMIN: 4.5 g/dL (ref 3.5–5.5)
ALK PHOS: 88 IU/L (ref 39–117)
ALT: 29 IU/L (ref 0–44)
AST: 23 IU/L (ref 0–40)
Albumin/Globulin Ratio: 1.6 (ref 1.2–2.2)
BUN / CREAT RATIO: 11 (ref 9–20)
BUN: 11 mg/dL (ref 6–20)
Bilirubin Total: <0.2 mg/dL (ref 0.0–1.2)
CO2: 25 mmol/L (ref 20–29)
CREATININE: 1 mg/dL (ref 0.76–1.27)
Calcium: 9.3 mg/dL (ref 8.7–10.2)
Chloride: 101 mmol/L (ref 96–106)
GFR calc Af Amer: 111 mL/min/{1.73_m2} (ref >59)
GFR calc non Af Amer: 96 mL/min/{1.73_m2} (ref >59)
GLUCOSE: 108 mg/dL — AB (ref 65–99)
Globulin, Total: 2.9 g/dL (ref 1.5–4.5)
Potassium: 4.2 mmol/L (ref 3.5–5.2)
Sodium: 139 mmol/L (ref 134–144)
TOTAL PROTEIN: 7.4 g/dL (ref 6.0–8.5)

## 2018-10-05 LAB — HIV ANTIBODY (ROUTINE TESTING W REFLEX): HIV Screen 4th Generation wRfx: NONREACTIVE

## 2018-10-05 LAB — TSH: TSH: 0.816 u[IU]/mL (ref 0.450–4.500)

## 2018-10-05 LAB — T4, FREE: FREE T4: 1.18 ng/dL (ref 0.82–1.77)

## 2018-10-05 LAB — RPR: RPR Ser Ql: NONREACTIVE

## 2018-10-09 ENCOUNTER — Other Ambulatory Visit: Payer: Self-pay | Admitting: Family Medicine

## 2018-10-09 DIAGNOSIS — K219 Gastro-esophageal reflux disease without esophagitis: Secondary | ICD-10-CM

## 2018-11-04 DIAGNOSIS — K219 Gastro-esophageal reflux disease without esophagitis: Secondary | ICD-10-CM | POA: Diagnosis not present

## 2018-11-04 DIAGNOSIS — Z880 Allergy status to penicillin: Secondary | ICD-10-CM | POA: Diagnosis not present

## 2018-11-04 DIAGNOSIS — Z888 Allergy status to other drugs, medicaments and biological substances status: Secondary | ICD-10-CM | POA: Diagnosis not present

## 2018-11-04 DIAGNOSIS — R197 Diarrhea, unspecified: Secondary | ICD-10-CM | POA: Diagnosis not present

## 2018-11-04 DIAGNOSIS — R112 Nausea with vomiting, unspecified: Secondary | ICD-10-CM | POA: Diagnosis not present

## 2018-11-04 DIAGNOSIS — Z79899 Other long term (current) drug therapy: Secondary | ICD-10-CM | POA: Diagnosis not present

## 2018-11-12 ENCOUNTER — Ambulatory Visit (INDEPENDENT_AMBULATORY_CARE_PROVIDER_SITE_OTHER): Payer: Medicare Other | Admitting: Family Medicine

## 2018-11-12 ENCOUNTER — Other Ambulatory Visit: Payer: Self-pay

## 2018-11-12 VITALS — BP 120/68 | HR 88 | Temp 98.3°F | Wt 254.0 lb

## 2018-11-12 DIAGNOSIS — M549 Dorsalgia, unspecified: Secondary | ICD-10-CM | POA: Diagnosis not present

## 2018-11-12 MED ORDER — MELOXICAM 15 MG PO TABS: 15.0000 mg | ORAL_TABLET | Freq: Every day | ORAL | 0 refills | Status: DC

## 2018-11-12 NOTE — Patient Instructions (Signed)
It was great seeing you today! We have addressed the following issues today  1. I ordered an Xray of your lower and mid back. You will follow up on results in three weeks. 2. I prescribe Meloxicam, please take once a day for 10 days.  If we did any lab work today, and the results require attention, either me or my nurse will get in touch with you. If everything is normal, you will get a letter in mail and a message via . If you don't hear from [place] in two weeks, please give [place] a call. Otherwise, we look forward to seeing you again at your next visit. If you have any questions or concerns before then, please call the clinic at [phone].  Please bring all your medications to every doctors visit  Sign up for My Chart to have easy access to your labs results, and communication with your Primary care physician. Please ask Front Desk for some assistance.   Please check-out at the front desk before leaving the clinic.    Take Care,   Dr. Andy Gauss

## 2018-11-12 NOTE — Progress Notes (Signed)
Subjective:    Patient ID: [name], male    DOB: [birth year], 38 y.o.   MRN: [id number]   CC: Low back pain  HPI: Patient is a 37 yo male who present with low back pain. Patient reports that pain has been going on for the past two years. He has been taking tylenol and ibuprofen almost every day with no improvement in symptoms. Patient reports that he has not had any recent trauma or injury. He has has imaging in the past. He is a Patent attorney and a Adult nurse. Patient endorses some shooting pain in the back of his thighs. No urinary or bowel incontinence.  No neurologic deficit.  Patient reports the pain is so bad it is difficult for him to operate a motor vehicle.  Smoking status reviewed   ROS: all other systems were reviewed and are negative other than in the HPI   Past Medical History:  Diagnosis Date  . Allergy   . Anxiety   . 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 Bipolar disorder ([place])    On disability  . Depression   . Diabetes mellitus without complication ([place])   . Headache(784.0)   . Hyperlipidemia   . Neuromuscular disorder ([place])    Beckers muscular dystrophy  . Obesity   . Sleep apnea    Does not tolerate CPAP    Past Surgical History:  Procedure Laterality Date  . NM MYOCAR PERF WALL MOTION  01/22/2011   protocol: Persantine, moderate reversible inferior defect post stress EF 48%, high risk scan  . TRANSTHORACIC ECHOCARDIOGRAM  07/05/2004   EF=>55% normal study     Past medical history, surgical, family, and social history reviewed and updated in the EMR as appropriate.  Objective:  BP 120/68   Pulse 88   Temp 98.3 F (36.8 C) (Oral)   Wt 254 lb (115.2 kg)   SpO2 96%   BMI 38.62 kg/m   Vitals and nursing note reviewed  General: NAD, pleasant, able to participate in exam Cardiac: RRR, normal heart sounds, no murmurs. 2+ radial and PT pulses bilaterally Respiratory: CTAB, normal effort, No wheezes, rales or rhonchi Abdomen: soft,  nontender, nondistended, no hepatic or splenomegaly, +BS Low back exam: No lumbar lordosis, thoracic kyphosis, pelvic assymmetry/tilt. No TTP of spinous process, sacroiliac joints, paraspinous muscles Full lumbar flexion/extension, strength Hip ( adduction, flexion), abnormal straight leg raise, FABER, patellar and achilles reflex intact.  Extremities: no edema or cyanosis. WWP. Skin: warm and dry, no rashes noted Neuro: alert and oriented x4, no focal deficits Psych: Normal affect and mood   Assessment & Plan:   Back pain Patient presented with low back pain with radiculopathy bilaterally to his knees.  Pain did not respond to Tylenol or NSAIDs.  Patient has a history of thoracic/lumbar lordosis, last imaging back in 2003/2004.  Given lack of improvement with conservative management we will repeat imaging for reassessment of thoracic and lumbar anatomy.  Given radiculopathy suspect possible nerve impingement pain is likely to be secondary to muscle spasms/strain.  We will start with x-ray thoracic and lumbar.  Patient will follow-up with PCP to discuss results from imaging.  In the meantime, we will start him on meloxicam 15 mg daily for 10 days.  Patient knows to stop taking ibuprofen.  Patient has been scheduled with PCP on 2/18.  Will consider MRI, physical therapy and steroid as needed    Marjie Skiff, MD [place] PGY-3

## 2018-11-12 NOTE — Assessment & Plan Note (Signed)
Patient presented with low back pain with radiculopathy bilaterally to his knees.  Pain did not respond to Tylenol or NSAIDs.  Patient has a history of thoracic/lumbar lordosis, last imaging back in 2003/2004.  Given lack of improvement with conservative management we will repeat imaging for reassessment of thoracic and lumbar anatomy.  Given radiculopathy suspect possible nerve impingement pain is likely to be secondary to muscle spasms/strain.  We will start with x-ray thoracic and lumbar.  Patient will follow-up with PCP to discuss results from imaging.  In the meantime, we will start him on meloxicam 15 mg daily for 10 days.  Patient knows to stop taking ibuprofen.  Patient has been scheduled with PCP on 2/18.  Will consider MRI, physical therapy and steroid as needed

## 2018-11-16 DIAGNOSIS — R1030 Lower abdominal pain, unspecified: Secondary | ICD-10-CM | POA: Diagnosis not present

## 2018-11-16 DIAGNOSIS — Z79899 Other long term (current) drug therapy: Secondary | ICD-10-CM | POA: Diagnosis not present

## 2018-11-16 DIAGNOSIS — R1032 Left lower quadrant pain: Secondary | ICD-10-CM | POA: Diagnosis not present

## 2018-11-16 DIAGNOSIS — R300 Dysuria: Secondary | ICD-10-CM | POA: Diagnosis not present

## 2018-11-16 DIAGNOSIS — K5900 Constipation, unspecified: Secondary | ICD-10-CM | POA: Diagnosis not present

## 2018-11-16 DIAGNOSIS — K219 Gastro-esophageal reflux disease without esophagitis: Secondary | ICD-10-CM | POA: Diagnosis not present

## 2018-11-18 ENCOUNTER — Telehealth: Payer: Self-pay

## 2018-11-18 NOTE — Telephone Encounter (Signed)
Received voicemail from [place] with [facility] asking to call patient due to complaints of constipation with no BM x 2 weeks. She stated patient had been to ED x 3 with no treatment given. Stated patient c/o abdominal pain, nausea, and dysuria.  Reviewed office visit note from 11/12/18 and patient did not mention this at visit.   Attempted to call patient x 2 at [phone] and kept getting "your call cannot go through at this time".  Will attempt to call again tomorrow to offer patient an appointment with physician to discuss.  Danley Danker, RN [facility] RN)

## 2018-11-18 NOTE — Telephone Encounter (Signed)
We did not discuss constipation at last OV. He can be see in pool clinic, pcp or me for this constipation issue. PCP should be discussing this with patient and prescribing something for him if she feels its appropriate.   Thanks   Marjie Skiff, MD [place], PGY-3

## 2018-11-19 DIAGNOSIS — R109 Unspecified abdominal pain: Secondary | ICD-10-CM | POA: Diagnosis not present

## 2018-11-19 DIAGNOSIS — Z888 Allergy status to other drugs, medicaments and biological substances status: Secondary | ICD-10-CM | POA: Diagnosis not present

## 2018-11-19 DIAGNOSIS — Z79899 Other long term (current) drug therapy: Secondary | ICD-10-CM | POA: Diagnosis not present

## 2018-11-19 DIAGNOSIS — K5901 Slow transit constipation: Secondary | ICD-10-CM | POA: Diagnosis not present

## 2018-11-19 DIAGNOSIS — E1165 Type 2 diabetes mellitus with hyperglycemia: Secondary | ICD-10-CM | POA: Diagnosis not present

## 2018-11-19 DIAGNOSIS — K219 Gastro-esophageal reflux disease without esophagitis: Secondary | ICD-10-CM | POA: Diagnosis not present

## 2018-11-19 DIAGNOSIS — I700 Atherosclerosis of aorta: Secondary | ICD-10-CM | POA: Diagnosis not present

## 2018-11-19 DIAGNOSIS — Z880 Allergy status to penicillin: Secondary | ICD-10-CM | POA: Diagnosis not present

## 2018-11-19 DIAGNOSIS — R739 Hyperglycemia, unspecified: Secondary | ICD-10-CM | POA: Diagnosis not present

## 2018-11-19 DIAGNOSIS — R1084 Generalized abdominal pain: Secondary | ICD-10-CM | POA: Diagnosis not present

## 2018-11-19 DIAGNOSIS — K529 Noninfective gastroenteritis and colitis, unspecified: Secondary | ICD-10-CM | POA: Diagnosis not present

## 2018-11-19 DIAGNOSIS — K560 Paralytic ileus: Secondary | ICD-10-CM | POA: Diagnosis not present

## 2018-11-19 NOTE — Telephone Encounter (Signed)
Brooke attempted to reach patient, phone rings continuously with no answer and no voicemail.  Danley Danker, RN [facility] RN)

## 2018-11-22 ENCOUNTER — Encounter (HOSPITAL_COMMUNITY): Payer: Self-pay | Admitting: Emergency Medicine

## 2018-11-22 ENCOUNTER — Other Ambulatory Visit: Payer: Self-pay

## 2018-11-22 ENCOUNTER — Ambulatory Visit (HOSPITAL_COMMUNITY)
Admission: RE | Admit: 2018-11-22 | Discharge: 2018-11-22 | Disposition: A | Discharge status: Home / Self Care | Payer: Medicare Other | Source: Ambulatory Visit | Attending: Family Medicine | Admitting: Family Medicine

## 2018-11-22 ENCOUNTER — Encounter: Payer: Self-pay | Admitting: Family Medicine

## 2018-11-22 ENCOUNTER — Inpatient Hospital Stay (HOSPITAL_COMMUNITY)
Admission: EM | Admit: 2018-11-22 | Discharge: 2018-11-30 | DRG: 329 | Disposition: A | Discharge status: Home Health | Payer: Medicare Other | Attending: Family Medicine | Admitting: Family Medicine

## 2018-11-22 ENCOUNTER — Ambulatory Visit (INDEPENDENT_AMBULATORY_CARE_PROVIDER_SITE_OTHER): Payer: Medicare Other | Admitting: Family Medicine

## 2018-11-22 ENCOUNTER — Emergency Department (HOSPITAL_COMMUNITY): Payer: Medicare Other

## 2018-11-22 VITALS — BP 110/78 | HR 118 | Temp 99.0°F

## 2018-11-22 DIAGNOSIS — G8929 Other chronic pain: Secondary | ICD-10-CM | POA: Diagnosis present

## 2018-11-22 DIAGNOSIS — R072 Precordial pain: Secondary | ICD-10-CM

## 2018-11-22 DIAGNOSIS — E876 Hypokalemia: Secondary | ICD-10-CM | POA: Diagnosis present

## 2018-11-22 DIAGNOSIS — R1084 Generalized abdominal pain: Secondary | ICD-10-CM | POA: Diagnosis not present

## 2018-11-22 DIAGNOSIS — K9419 Other complications of enterostomy: Secondary | ICD-10-CM | POA: Diagnosis not present

## 2018-11-22 DIAGNOSIS — G4700 Insomnia, unspecified: Secondary | ICD-10-CM | POA: Diagnosis not present

## 2018-11-22 DIAGNOSIS — G7101 Duchenne or Becker muscular dystrophy: Secondary | ICD-10-CM | POA: Diagnosis present

## 2018-11-22 DIAGNOSIS — R109 Unspecified abdominal pain: Secondary | ICD-10-CM | POA: Diagnosis present

## 2018-11-22 DIAGNOSIS — Z6837 Body mass index (BMI) 37.0-37.9, adult: Secondary | ICD-10-CM

## 2018-11-22 DIAGNOSIS — R42 Dizziness and giddiness: Secondary | ICD-10-CM | POA: Diagnosis not present

## 2018-11-22 DIAGNOSIS — J811 Chronic pulmonary edema: Secondary | ICD-10-CM | POA: Diagnosis not present

## 2018-11-22 DIAGNOSIS — R0789 Other chest pain: Secondary | ICD-10-CM | POA: Diagnosis not present

## 2018-11-22 DIAGNOSIS — K56699 Other intestinal obstruction unspecified as to partial versus complete obstruction: Secondary | ICD-10-CM | POA: Diagnosis not present

## 2018-11-22 DIAGNOSIS — Z933 Colostomy status: Secondary | ICD-10-CM | POA: Diagnosis not present

## 2018-11-22 DIAGNOSIS — R079 Chest pain, unspecified: Secondary | ICD-10-CM | POA: Insufficient documentation

## 2018-11-22 DIAGNOSIS — F319 Bipolar disorder, unspecified: Secondary | ICD-10-CM | POA: Diagnosis present

## 2018-11-22 DIAGNOSIS — R0902 Hypoxemia: Secondary | ICD-10-CM | POA: Diagnosis not present

## 2018-11-22 DIAGNOSIS — E1165 Type 2 diabetes mellitus with hyperglycemia: Secondary | ICD-10-CM | POA: Diagnosis not present

## 2018-11-22 DIAGNOSIS — D72829 Elevated white blood cell count, unspecified: Secondary | ICD-10-CM

## 2018-11-22 DIAGNOSIS — J189 Pneumonia, unspecified organism: Secondary | ICD-10-CM | POA: Diagnosis not present

## 2018-11-22 DIAGNOSIS — K5792 Diverticulitis of intestine, part unspecified, without perforation or abscess without bleeding: Secondary | ICD-10-CM | POA: Diagnosis not present

## 2018-11-22 DIAGNOSIS — E785 Hyperlipidemia, unspecified: Secondary | ICD-10-CM | POA: Diagnosis present

## 2018-11-22 DIAGNOSIS — K660 Peritoneal adhesions (postprocedural) (postinfection): Secondary | ICD-10-CM | POA: Diagnosis not present

## 2018-11-22 DIAGNOSIS — S31109A Unspecified open wound of abdominal wall, unspecified quadrant without penetration into peritoneal cavity, initial encounter: Secondary | ICD-10-CM | POA: Diagnosis not present

## 2018-11-22 DIAGNOSIS — J9811 Atelectasis: Secondary | ICD-10-CM | POA: Diagnosis not present

## 2018-11-22 DIAGNOSIS — T8189XA Other complications of procedures, not elsewhere classified, initial encounter: Secondary | ICD-10-CM | POA: Diagnosis not present

## 2018-11-22 DIAGNOSIS — R300 Dysuria: Secondary | ICD-10-CM | POA: Diagnosis present

## 2018-11-22 DIAGNOSIS — K56609 Unspecified intestinal obstruction, unspecified as to partial versus complete obstruction: Secondary | ICD-10-CM | POA: Diagnosis present

## 2018-11-22 DIAGNOSIS — R000 Tachycardia, unspecified: Secondary | ICD-10-CM | POA: Diagnosis present

## 2018-11-22 DIAGNOSIS — E860 Dehydration: Secondary | ICD-10-CM | POA: Diagnosis not present

## 2018-11-22 DIAGNOSIS — K5732 Diverticulitis of large intestine without perforation or abscess without bleeding: Secondary | ICD-10-CM | POA: Diagnosis not present

## 2018-11-22 DIAGNOSIS — K5651 Intestinal adhesions [bands], with partial obstruction: Secondary | ICD-10-CM | POA: Diagnosis not present

## 2018-11-22 DIAGNOSIS — Z833 Family history of diabetes mellitus: Secondary | ICD-10-CM

## 2018-11-22 DIAGNOSIS — G4730 Sleep apnea, unspecified: Secondary | ICD-10-CM | POA: Diagnosis not present

## 2018-11-22 DIAGNOSIS — K6389 Other specified diseases of intestine: Secondary | ICD-10-CM

## 2018-11-22 DIAGNOSIS — Z8349 Family history of other endocrine, nutritional and metabolic diseases: Secondary | ICD-10-CM

## 2018-11-22 DIAGNOSIS — G4733 Obstructive sleep apnea (adult) (pediatric): Secondary | ICD-10-CM | POA: Diagnosis not present

## 2018-11-22 DIAGNOSIS — R740 Nonspecific elevation of levels of transaminase and lactic acid dehydrogenase [LDH]: Secondary | ICD-10-CM | POA: Diagnosis not present

## 2018-11-22 DIAGNOSIS — Z888 Allergy status to other drugs, medicaments and biological substances status: Secondary | ICD-10-CM

## 2018-11-22 DIAGNOSIS — J9601 Acute respiratory failure with hypoxia: Secondary | ICD-10-CM | POA: Diagnosis not present

## 2018-11-22 DIAGNOSIS — Y95 Nosocomial condition: Secondary | ICD-10-CM | POA: Diagnosis present

## 2018-11-22 DIAGNOSIS — R319 Hematuria, unspecified: Secondary | ICD-10-CM | POA: Diagnosis present

## 2018-11-22 DIAGNOSIS — R140 Abdominal distension (gaseous): Secondary | ICD-10-CM | POA: Diagnosis not present

## 2018-11-22 DIAGNOSIS — F909 Attention-deficit hyperactivity disorder, unspecified type: Secondary | ICD-10-CM | POA: Diagnosis present

## 2018-11-22 DIAGNOSIS — K631 Perforation of intestine (nontraumatic): Secondary | ICD-10-CM | POA: Diagnosis not present

## 2018-11-22 DIAGNOSIS — K3532 Acute appendicitis with perforation and localized peritonitis, without abscess: Secondary | ICD-10-CM | POA: Diagnosis not present

## 2018-11-22 DIAGNOSIS — R0602 Shortness of breath: Secondary | ICD-10-CM | POA: Diagnosis not present

## 2018-11-22 DIAGNOSIS — Z880 Allergy status to penicillin: Secondary | ICD-10-CM

## 2018-11-22 DIAGNOSIS — I959 Hypotension, unspecified: Secondary | ICD-10-CM | POA: Diagnosis not present

## 2018-11-22 DIAGNOSIS — K219 Gastro-esophageal reflux disease without esophagitis: Secondary | ICD-10-CM | POA: Diagnosis not present

## 2018-11-22 DIAGNOSIS — K5720 Diverticulitis of large intestine with perforation and abscess without bleeding: Secondary | ICD-10-CM | POA: Diagnosis not present

## 2018-11-22 DIAGNOSIS — F17290 Nicotine dependence, other tobacco product, uncomplicated: Secondary | ICD-10-CM | POA: Diagnosis present

## 2018-11-22 DIAGNOSIS — M549 Dorsalgia, unspecified: Secondary | ICD-10-CM | POA: Diagnosis not present

## 2018-11-22 DIAGNOSIS — M62830 Muscle spasm of back: Secondary | ICD-10-CM | POA: Diagnosis not present

## 2018-11-22 DIAGNOSIS — Z791 Long term (current) use of non-steroidal anti-inflammatories (NSAID): Secondary | ICD-10-CM

## 2018-11-22 DIAGNOSIS — K621 Rectal polyp: Secondary | ICD-10-CM | POA: Diagnosis not present

## 2018-11-22 DIAGNOSIS — F17210 Nicotine dependence, cigarettes, uncomplicated: Secondary | ICD-10-CM | POA: Diagnosis present

## 2018-11-22 DIAGNOSIS — Z79899 Other long term (current) drug therapy: Secondary | ICD-10-CM

## 2018-11-22 DIAGNOSIS — K565 Intestinal adhesions [bands], unspecified as to partial versus complete obstruction: Secondary | ICD-10-CM | POA: Diagnosis not present

## 2018-11-22 DIAGNOSIS — Z825 Family history of asthma and other chronic lower respiratory diseases: Secondary | ICD-10-CM

## 2018-11-22 LAB — CBC
HCT: 42.2 % (ref 39.0–52.0)
Hemoglobin: 14.8 g/dL (ref 13.0–17.0)
MCH: 29.8 pg (ref 26.0–34.0)
MCHC: 35.1 g/dL (ref 30.0–36.0)
MCV: 85.1 fL (ref 80.0–100.0)
Platelets: 239 10*3/uL (ref 150–400)
RBC: 4.96 MIL/uL (ref 4.22–5.81)
RDW: 13.4 % (ref 11.5–15.5)
WBC: 9.8 10*3/uL (ref 4.0–10.5)
nRBC: 0 % (ref 0.0–0.2)

## 2018-11-22 LAB — BASIC METABOLIC PANEL
Anion gap: 16 — ABNORMAL HIGH (ref 5–15)
BUN: 12 mg/dL (ref 6–20)
CO2: 23 mmol/L (ref 22–32)
CREATININE: 1.15 mg/dL (ref 0.61–1.24)
Calcium: 8.9 mg/dL (ref 8.9–10.3)
Chloride: 97 mmol/L — ABNORMAL LOW (ref 98–111)
GFR calc Af Amer: >60 mL/min (ref >60)
GFR calc non Af Amer: >60 mL/min (ref >60)
Glucose, Bld: 104 mg/dL — ABNORMAL HIGH (ref 70–99)
Potassium: 2.9 mmol/L — ABNORMAL LOW (ref 3.5–5.1)
Sodium: 136 mmol/L (ref 135–145)

## 2018-11-22 LAB — HEPATIC FUNCTION PANEL
ALT: 35 U/L (ref 0–44)
AST: 43 U/L — ABNORMAL HIGH (ref 15–41)
Albumin: 3.9 g/dL (ref 3.5–5.0)
Alkaline Phosphatase: 67 U/L (ref 38–126)
Bilirubin, Direct: 0.3 mg/dL — ABNORMAL HIGH (ref 0.0–0.2)
Indirect Bilirubin: 0.6 mg/dL (ref 0.3–0.9)
Total Bilirubin: 0.9 mg/dL (ref 0.3–1.2)
Total Protein: 7.6 g/dL (ref 6.5–8.1)

## 2018-11-22 LAB — TROPONIN I: Troponin I: <0.03 ng/mL (ref <0.03)

## 2018-11-22 LAB — LIPASE, BLOOD: Lipase: 21 U/L (ref 11–51)

## 2018-11-22 LAB — LACTIC ACID, PLASMA: LACTIC ACID, VENOUS: 1 mmol/L (ref 0.5–1.9)

## 2018-11-22 LAB — D-DIMER, QUANTITATIVE: D-Dimer, Quant: 5.74 ug/mL-FEU — ABNORMAL HIGH (ref 0.00–0.50)

## 2018-11-22 MED ORDER — METRONIDAZOLE IN NACL 5-0.79 MG/ML-% IV SOLN
500.0000 mg | Freq: Three times a day (TID) | INTRAVENOUS | Status: DC
  Administered 2018-11-22 – 2018-11-26 (×11): 500 mg via INTRAVENOUS
  Filled 2018-11-22 (×11): qty 100

## 2018-11-22 MED ORDER — ONDANSETRON HCL 4 MG/2ML IJ SOLN
4.0000 mg | Freq: Four times a day (QID) | INTRAMUSCULAR | Status: DC | PRN
  Administered 2018-11-23 – 2018-11-28 (×6): 4 mg via INTRAVENOUS
  Filled 2018-11-22 (×7): qty 2

## 2018-11-22 MED ORDER — ASPIRIN 325 MG PO TABS
325.0000 mg | ORAL_TABLET | Freq: Once | ORAL | Status: AC
  Administered 2018-11-22: 325 mg via ORAL

## 2018-11-22 MED ORDER — IOPAMIDOL (ISOVUE-370) INJECTION 76%
100.0000 mL | Freq: Once | INTRAVENOUS | Status: AC | PRN
  Administered 2018-11-22: 100 mL via INTRAVENOUS

## 2018-11-22 MED ORDER — SODIUM CHLORIDE 0.9 % IV SOLN
2.0000 g | Freq: Three times a day (TID) | INTRAVENOUS | Status: DC
  Administered 2018-11-23 (×2): 2 g via INTRAVENOUS
  Filled 2018-11-22 (×3): qty 2

## 2018-11-22 MED ORDER — POTASSIUM CHLORIDE 10 MEQ/100ML IV SOLN
10.0000 meq | INTRAVENOUS | Status: DC
  Filled 2018-11-22 (×2): qty 100

## 2018-11-22 MED ORDER — MORPHINE SULFATE (PF) 4 MG/ML IV SOLN
4.0000 mg | Freq: Once | INTRAVENOUS | Status: AC
  Administered 2018-11-22: 4 mg via INTRAVENOUS
  Filled 2018-11-22: qty 1

## 2018-11-22 MED ORDER — NITROGLYCERIN 0.4 MG SL SUBL: 0.4000 mg | SUBLINGUAL_TABLET | Freq: Once | SUBLINGUAL | Status: DC

## 2018-11-22 MED ORDER — NITROGLYCERIN 0.4 MG SL SUBL
0.4000 mg | SUBLINGUAL_TABLET | Freq: Once | SUBLINGUAL | Status: AC
  Administered 2018-11-22: 0.4 mg via SUBLINGUAL

## 2018-11-22 MED ORDER — ASPIRIN 81 MG PO CHEW: 81.0000 mg | CHEWABLE_TABLET | Freq: Once | ORAL | Status: DC

## 2018-11-22 MED ORDER — IOPAMIDOL (ISOVUE-370) INJECTION 76%
INTRAVENOUS | Status: AC
  Filled 2018-11-22: qty 100

## 2018-11-22 MED ORDER — SODIUM CHLORIDE 0.9 % IV BOLUS
1000.0000 mL | Freq: Once | INTRAVENOUS | Status: AC
  Administered 2018-11-22: 1000 mL via INTRAVENOUS

## 2018-11-22 MED ORDER — KETOROLAC TROMETHAMINE 30 MG/ML IJ SOLN
30.0000 mg | Freq: Once | INTRAMUSCULAR | Status: AC
  Administered 2018-11-22: 30 mg via INTRAVENOUS
  Filled 2018-11-22: qty 1

## 2018-11-22 MED ORDER — SODIUM CHLORIDE 0.9 % IV SOLN
2.0000 g | Freq: Once | INTRAVENOUS | Status: AC
  Administered 2018-11-22: 2 g via INTRAVENOUS
  Filled 2018-11-22: qty 2

## 2018-11-22 MED ORDER — ONDANSETRON HCL 4 MG/2ML IJ SOLN
4.0000 mg | Freq: Once | INTRAMUSCULAR | Status: AC
  Administered 2018-11-22: 4 mg via INTRAVENOUS
  Filled 2018-11-22: qty 2

## 2018-11-22 MED ORDER — ASPIRIN 325 MG PO TABS: 325.0000 mg | ORAL_TABLET | Freq: Once | ORAL | Status: DC

## 2018-11-22 MED ORDER — ONDANSETRON HCL 4 MG PO TABS: 4.0000 mg | ORAL_TABLET | Freq: Four times a day (QID) | ORAL | Status: DC | PRN

## 2018-11-22 MED ORDER — POTASSIUM CHLORIDE 10 MEQ/100ML IV SOLN
10.0000 meq | INTRAVENOUS | Status: AC
  Administered 2018-11-22 – 2018-11-23 (×4): 10 meq via INTRAVENOUS
  Filled 2018-11-22: qty 100

## 2018-11-22 NOTE — Progress Notes (Signed)
Pharmacy Antibiotic Note  [name] is a 38 y.o. male admitted on 11/22/2018 with Intra-abdominal infection.  Pharmacy has been consulted for Aztreonam dosing.  CC/HPI: CP with back pain/spasms, sweaty.  Ddimer 5.74. CT negative for PE  PMH: +FH of CAD, memory difficulty, ED, skin problems, neck and back pain, allergic rhinitis, DM, GERD, HAs, gait DO, HTN, narcotic abuse, obesity, muscular dystrophy, OSA, Bipolar, HLD, alcohol, tobacco  Significant events: 2/3: Abdominal CT: Adjacent to the colon, there is an enhancing area of infiltration contiguous with the bladder wall along with bladder wall thickening. This could represent diverticulitis with fistula formation although the abrupt margins of the lesion are suspicious for colon cancer in which case this would be locally invasive. There is evidence of proximal colonic obstruction with interval development of moderate colonic distention with gas and air-fluid levels.   ID: r/o sepsis with intraabdominal infection.. Afebrile. LA 1. WBC 9.8 Aztreonam 2/3>>  Plan: Aztreonam 2g IV q 8hrs    Height: $Remove'5\' 8"'[id number]$  (172.7 cm) Weight: 248 lb (112.5 kg) IBW/kg (Calculated) : 68.4  Temp (24hrs), Avg:98.8 F (37.1 C), Min:98.6 F (37 C), Max:99 F (37.2 C)  Recent Labs  Lab 11/22/18 1549 11/22/18 1645 11/22/18 1700  WBC 9.8  --   --   CREATININE  --   --  1.15  LATICACIDVEN  --  1.0  --     Estimated Creatinine Clearance: 107 mL/min (by C-G formula based on SCr of 1.15 mg/dL).    Allergies  Allergen Reactions  . Penicillins Shortness Of Breath  . Metformin And Related Other (See Comments)    Stomach cramps     Donavyn Fecher S. Alford Highland, PharmD, [place] Clinical Staff Pharmacist  Eilene Ghazi [facility] 11/22/2018 8:43 PM

## 2018-11-22 NOTE — ED Notes (Signed)
Patient transported to X-ray 

## 2018-11-22 NOTE — ED Provider Notes (Signed)
Emergency Department Provider Note   I have reviewed the triage vital signs and the nursing notes.   HISTORY  Chief Complaint Chest Pain   HPI [name] is a 38 y.o. male with PMH of muscular dystrophy, DM, HLD, and OSA presents to the emergency department with chest tightness which is been intermittent.  Symptoms began today.  He was initially at work but left to go to his PCPs office.  He told him about his chest pain and was given an aspirin, nitroglycerin, and transported to the emergency department by EMS.  In receiving the nitroglycerin the patient's blood pressure dropped to 90/50.  No significant change in his chest pain symptoms with nitroglycerin.  Patient has had no prior history of chest pain or known heart disease.  He denies any trauma to the chest.   Patient also notes muscle spasms in the back and arms.  He has not had these symptoms in the past.  He has been eating and drinking but has had some nausea/vomiting with abdominal discomfort.  Patient states he was seen in the [facility] emergency department several days ago with abdominal pain.  He reports having a CT which showed inflammation of the colon.  He was started on nausea medication and antibiotics.  He had some associated constipation but has started to have bowel movements within the last 36 hours.  Abdominal pain is not worsening.   Past Medical History:  Diagnosis Date  . Allergy   . Anxiety   . 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 Bipolar disorder ([place])    On disability  . Depression   . Diabetes mellitus without complication ([place])   . Headache(784.0)   . Hyperlipidemia   . Neuromuscular disorder ([place])    Beckers muscular dystrophy  . Obesity   . Sleep apnea    Does not tolerate CPAP    Patient Active Problem List   Diagnosis Date Noted  . Other chest pain 11/22/2018  . Abdominal pain 11/22/2018  . Memory difficulty 10/04/2018  . Dysuria 09/10/2018  . Erectile dysfunction 01/29/2018  . Rash and  nonspecific skin eruption 05/10/2017  . Neck pain 11/12/2016  . Back pain 10/25/2016  . Sleep difficulties 10/25/2016  . Allergic rhinitis 04/28/2016  . Dizzy 12/07/2015  . Type 2 diabetes mellitus without complication ([place]) 37/48/2707  . GERD (gastroesophageal reflux disease) 03/21/2013  . Headache 01/31/2013  . Gait disorder 01/31/2013  . Elevated BP 04/25/2012  . Narcotic abuse ([place]) 06/05/2011  . OBESITY 10/17/2010  . 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 SLEEP APNEA 01/24/2009  . BIPOLAR DISORDER UNSPECIFIED 05/05/2008  . HYPERLIPIDEMIA 03/30/2008  . ABUSE, ALCOHOL, UNSPECIFIED 04/26/2007  . TOBACCO ABUSE 12/25/2006    Past Surgical History:  Procedure Laterality Date  . NM MYOCAR PERF WALL MOTION  01/22/2011   protocol: Persantine, moderate reversible inferior defect post stress EF 48%, high risk scan  . TRANSTHORACIC ECHOCARDIOGRAM  07/05/2004   EF=>55% normal study    Allergies Penicillins and Metformin and related  Family History  Problem Relation Age of Onset  . COPD Mother   . Depression Mother   . Diabetes Mother   . Hyperlipidemia Mother   . Asthma Father   . Arthritis Father   . Diabetes Father   . Heart disease Father   . Hyperlipidemia Father   . Hypertension Father   . Hypertension Brother     Social History Social History   Tobacco Use  . Smoking status: Current Every Day  Smoker    Packs/day: 1.00    Years: 21.00    Pack years: 21.00    Types: Cigarettes  . Smokeless tobacco: Never Used  . Tobacco comment: Reports using vapor cigarettes currently trying to quit  Substance Use Topics  . Alcohol use: No    Alcohol/week: 0.0 standard drinks    Comment: quit 2013  . Drug use: No    Review of Systems  Constitutional: No fever/chills Eyes: No visual changes. ENT: No sore throat. Cardiovascular: Positive chest pain. Respiratory: Denies shortness of breath. Gastrointestinal: Positive abdominal pain. Positive nausea and vomiting.  No  diarrhea. Positive constipation. Genitourinary: Negative for dysuria. Musculoskeletal: Negative for back pain. Positive cramping pain in the arms and back.  Skin: Negative for rash. Neurological: Negative for headaches, focal weakness or numbness.  10-point ROS otherwise negative.  ____________________________________________   PHYSICAL EXAM:  VITAL SIGNS: ED Triage Vitals  Enc Vitals Group     BP 11/22/18 1519 131/79     Pulse Rate 11/22/18 1519 100     Resp 11/22/18 1519 18     Temp 11/22/18 1519 98.6 F (37 C)     Temp Source 11/22/18 1519 Oral     SpO2 11/22/18 1519 97 %     Weight 11/22/18 1519 248 lb (112.5 kg)     Height 11/22/18 1519 $RemoveBefor'5\' 8"'rHhxKGghdGNg$  (1.727 m)     Pain Score 11/22/18 1533 8   Constitutional: Alert and oriented. Well appearing and in no acute distress. Eyes: Conjunctivae are normal.  Head: Atraumatic. Nose: No congestion/rhinnorhea. Mouth/Throat: Mucous membranes are moist.  Neck: No stridor.   Cardiovascular: Tachycardia. Good peripheral circulation. Grossly normal heart sounds.   Respiratory: Normal respiratory effort.  No retractions. Lungs CTAB. Gastrointestinal: Soft and nontender. No distention.  Musculoskeletal: No lower extremity tenderness nor edema. No gross deformities of extremities. No tenderness to palpation of the chest wall.  Neurologic:  Normal speech and language. No gross focal neurologic deficits are appreciated.  Skin:  Skin is warm, dry and intact. No rash noted.  ____________________________________________   LABS (all labs ordered are listed, but only abnormal results are displayed)  Labs Reviewed  D-DIMER, QUANTITATIVE (NOT AT [facility]) - Abnormal; Notable for the following components:      Result Value   D-Dimer, Quant 5.74 (*)    All other components within normal limits  HEPATIC FUNCTION PANEL - Abnormal; Notable for the following components:   AST 43 (*)    Bilirubin, Direct 0.3 (*)    All other components within normal limits   BASIC METABOLIC PANEL - Abnormal; Notable for the following components:   Potassium 2.9 (*)    Chloride 97 (*)    Glucose, Bld 104 (*)    Anion gap 16 (*)    All other components within normal limits  CBC  LACTIC ACID, PLASMA  LIPASE, BLOOD  TROPONIN I  LACTIC ACID, PLASMA  CEA   ____________________________________________  EKG   EKG Interpretation  Date/Time:  Monday November 22 2018 15:20:09 EST Ventricular Rate:  108 PR Interval:    QRS Duration: 95 QT Interval:  326 QTC Calculation: 437 R Axis:   77 Text Interpretation:  Sinus tachycardia Borderline T abnormalities, inferior leads No STEMI.  Confirmed by Nanda Quinton [phone]) on 11/22/2018 3:32:38 PM       ____________________________________________  RADIOLOGY  Dg Chest 2 View  Result Date: 11/22/2018 CLINICAL DATA:  Severe chest pain. EXAM: CHEST - 2 VIEW COMPARISON:  Chest x-ray dated 04/26/2007  and abdominal radiographs dated 11/19/2018 FINDINGS: The heart size and pulmonary vascularity are normal. There is slight atelectasis at the left lung base. No consolidative infiltrates or effusions. No bone abnormality. Slight distention of the colon. IMPRESSION: Slight atelectasis at the left lung base which may be due to a shallow inspiration. Increased gaseous distention of the colon since 11/19/2018. Electronically Signed   By: Lorriane Shire M.D.   On: 11/22/2018 16:39   Ct Angio Chest Pe W And/or Wo Contrast  Result Date: 11/22/2018 CLINICAL DATA:  Type midsternal and left-sided chest pain beginning today at 11 a.m. Back pain and spasms. Generalized abdominal pain. EXAM: CT ANGIOGRAPHY CHEST CT ABDOMEN AND PELVIS WITH CONTRAST TECHNIQUE: Multidetector CT imaging of the chest was performed using the standard protocol during bolus administration of intravenous contrast. Multiplanar CT image reconstructions and MIPs were obtained to evaluate the vascular anatomy. Multidetector CT imaging of the abdomen and pelvis was  performed using the standard protocol during bolus administration of intravenous contrast. CONTRAST:  153mL ISOVUE-370 IOPAMIDOL (ISOVUE-370) INJECTION 76% COMPARISON:  CT abdomen and pelvis 11/19/2018 FINDINGS: CTA CHEST FINDINGS Cardiovascular: There is good opacification of the central and segmental pulmonary arteries. No focal filling defects are demonstrated. No evidence of significant pulmonary embolus. Normal heart size. No pericardial effusions. Normal caliber thoracic aorta. No dissection. Great vessel origins are patent. Mediastinum/Nodes: Mediastinal lymph nodes are not pathologically enlarged. Esophagus is decompressed. Lungs/Pleura: Motion artifact limits evaluation. There is some linear atelectasis in the left mid lung. Mosaic attenuation pattern to the lungs is probably due to motion artifact but could also indicate air trapping or edema. No pleural effusions. No pneumothorax. Airways are patent. Musculoskeletal: No chest wall abnormality. No acute or significant osseous findings. Review of the MIP images confirms the above findings. CT ABDOMEN and PELVIS FINDINGS Hepatobiliary: No focal liver abnormality is seen. No gallstones, gallbladder wall thickening, or biliary dilatation. Pancreas: Unremarkable. No pancreatic ductal dilatation or surrounding inflammatory changes. Spleen: Normal in size without focal abnormality. Adrenals/Urinary Tract: Adrenal glands are unremarkable. Kidneys are normal, without renal calculi, focal lesion, or hydronephrosis. Focal bladder wall thickening superiorly, see below. Stomach/Bowel: Stomach and small bowel are decompressed. Segmental wall thickening of the sigmoid colon with abrupt shouldered margins and stranding in the adjacent fat. Adjacent to the colon, there is an enhancing area of infiltration contiguous with the bladder wall along with bladder wall thickening. This could represent diverticulitis with fistula formation although the abrupt margins of the lesion  are suspicious for colon cancer in which case this would be locally invasive. There is evidence of proximal colonic obstruction with interval development of moderate colonic distention with gas and air-fluid levels. Peripheral gas along the nondependent surfaces in the right colon indicates pneumatosis. No portal venous gas is identified although there is mesenteric infiltration and edema. There is also edema in the pericolic gutters and pelvis. Vascular/Lymphatic: Scattered aortic calcifications. No aneurysm. No significant lymphadenopathy. Reproductive: Prostate is unremarkable. Other: Small amount of free fluid in the abdomen and pelvis, progressing since previous study. No free air. Abdominal wall musculature appears intact. Musculoskeletal: Mild degenerative changes in the spine. No destructive bone lesions. Review of the MIP images confirms the above findings. IMPRESSION: 1. No evidence of significant pulmonary embolus. 2. Linear atelectasis in the left lung. 3. Mosaic attenuation pattern to the lungs is probably due to motion artifact but could indicate air trapping or edema. 4. Segmental wall thickening of the sigmoid colon with abrupt shouldered margins and stranding in the  adjacent fat. Adjacent to the colon, there is an enhancing area of infiltration contiguous with the bladder wall along with bladder wall thickening. This could represent diverticulitis with fistula formation although the abrupt margins of the lesion are suspicious for colon cancer in which case this would be locally invasive. Surgical consultation is recommended. 5. Interval development of proximal colonic obstruction with gas and fluid levels. 6. Interval development of right colonic pneumatosis. This may be related to obstruction or could indicate developing bowel necrosis. 7. Small amount of free fluid in the abdomen and pelvis, progressing since previous study. These results were called by telephone at the time of interpretation on  11/22/2018 at 7:47 pm to Dr. Nanda Quinton , who verbally acknowledged these results. Aortic Atherosclerosis (ICD10-I70.0). Electronically Signed   By: Lucienne Capers M.D.   On: 11/22/2018 19:51   Ct Abdomen Pelvis W Contrast  Result Date: 11/22/2018 CLINICAL DATA:  Type midsternal and left-sided chest pain beginning today at 11 a.m. Back pain and spasms. Generalized abdominal pain. EXAM: CT ANGIOGRAPHY CHEST CT ABDOMEN AND PELVIS WITH CONTRAST TECHNIQUE: Multidetector CT imaging of the chest was performed using the standard protocol during bolus administration of intravenous contrast. Multiplanar CT image reconstructions and MIPs were obtained to evaluate the vascular anatomy. Multidetector CT imaging of the abdomen and pelvis was performed using the standard protocol during bolus administration of intravenous contrast. CONTRAST:  177mL ISOVUE-370 IOPAMIDOL (ISOVUE-370) INJECTION 76% COMPARISON:  CT abdomen and pelvis 11/19/2018 FINDINGS: CTA CHEST FINDINGS Cardiovascular: There is good opacification of the central and segmental pulmonary arteries. No focal filling defects are demonstrated. No evidence of significant pulmonary embolus. Normal heart size. No pericardial effusions. Normal caliber thoracic aorta. No dissection. Great vessel origins are patent. Mediastinum/Nodes: Mediastinal lymph nodes are not pathologically enlarged. Esophagus is decompressed. Lungs/Pleura: Motion artifact limits evaluation. There is some linear atelectasis in the left mid lung. Mosaic attenuation pattern to the lungs is probably due to motion artifact but could also indicate air trapping or edema. No pleural effusions. No pneumothorax. Airways are patent. Musculoskeletal: No chest wall abnormality. No acute or significant osseous findings. Review of the MIP images confirms the above findings. CT ABDOMEN and PELVIS FINDINGS Hepatobiliary: No focal liver abnormality is seen. No gallstones, gallbladder wall thickening, or biliary  dilatation. Pancreas: Unremarkable. No pancreatic ductal dilatation or surrounding inflammatory changes. Spleen: Normal in size without focal abnormality. Adrenals/Urinary Tract: Adrenal glands are unremarkable. Kidneys are normal, without renal calculi, focal lesion, or hydronephrosis. Focal bladder wall thickening superiorly, see below. Stomach/Bowel: Stomach and small bowel are decompressed. Segmental wall thickening of the sigmoid colon with abrupt shouldered margins and stranding in the adjacent fat. Adjacent to the colon, there is an enhancing area of infiltration contiguous with the bladder wall along with bladder wall thickening. This could represent diverticulitis with fistula formation although the abrupt margins of the lesion are suspicious for colon cancer in which case this would be locally invasive. There is evidence of proximal colonic obstruction with interval development of moderate colonic distention with gas and air-fluid levels. Peripheral gas along the nondependent surfaces in the right colon indicates pneumatosis. No portal venous gas is identified although there is mesenteric infiltration and edema. There is also edema in the pericolic gutters and pelvis. Vascular/Lymphatic: Scattered aortic calcifications. No aneurysm. No significant lymphadenopathy. Reproductive: Prostate is unremarkable. Other: Small amount of free fluid in the abdomen and pelvis, progressing since previous study. No free air. Abdominal wall musculature appears intact. Musculoskeletal: Mild degenerative changes in  the spine. No destructive bone lesions. Review of the MIP images confirms the above findings. IMPRESSION: 1. No evidence of significant pulmonary embolus. 2. Linear atelectasis in the left lung. 3. Mosaic attenuation pattern to the lungs is probably due to motion artifact but could indicate air trapping or edema. 4. Segmental wall thickening of the sigmoid colon with abrupt shouldered margins and stranding in the  adjacent fat. Adjacent to the colon, there is an enhancing area of infiltration contiguous with the bladder wall along with bladder wall thickening. This could represent diverticulitis with fistula formation although the abrupt margins of the lesion are suspicious for colon cancer in which case this would be locally invasive. Surgical consultation is recommended. 5. Interval development of proximal colonic obstruction with gas and fluid levels. 6. Interval development of right colonic pneumatosis. This may be related to obstruction or could indicate developing bowel necrosis. 7. Small amount of free fluid in the abdomen and pelvis, progressing since previous study. These results were called by telephone at the time of interpretation on 11/22/2018 at 7:47 pm to Dr. Nanda Quinton , who verbally acknowledged these results. Aortic Atherosclerosis (ICD10-I70.0). Electronically Signed   By: Lucienne Capers M.D.   On: 11/22/2018 19:51    ____________________________________________   PROCEDURES  Procedure(s) performed:   Procedures  CRITICAL CARE Performed by: Margette Fast Total critical care time: 35 minutes Critical care time was exclusive of separately billable procedures and treating other patients. Critical care was necessary to treat or prevent imminent or life-threatening deterioration. Critical care was time spent personally by me on the following activities: development of treatment plan with patient and/or surrogate as well as nursing, discussions with consultants, evaluation of patient's response to treatment, examination of patient, obtaining history from patient or surrogate, ordering and performing treatments and interventions, ordering and review of laboratory studies, ordering and review of radiographic studies, pulse oximetry and re-evaluation of patient's condition.  Nanda Quinton, MD Emergency Medicine  ____________________________________________   INITIAL IMPRESSION / ASSESSMENT  AND PLAN / ED COURSE  Pertinent labs & imaging results that were available during my care of the patient were reviewed by me and considered in my medical decision making (see chart for details).  She presents to the emergency department with several complaints but chief among them is chest pain.  Quality is somewhat pleuritic and patient has tachycardia.  He is otherwise low risk for PE.  Plan for d-dimer along with troponin.  EKG shows a sinus tachycardia.  Patient had brief episode of hypotension in the PCPs office after receiving 1 nitroglycerin which did not improve his pain. HEART score of 3 (EKG 1, Risk factors 2).   08:29 PM Patient with elevated d-dimer prompting CT chest.  Chest x-ray shows increased dilation of the colon though CT abdomen pelvis was repeated.  Remaining lab work is largely unremarkable including lactate.  Called the radiology discussed the CT abdomen pelvis findings which include increased inflammation in the sigmoid colon with enhancement near the bladder.  Patient also with colon obstruction and pneumatosis with mesenteric enhancement.   Discussed the case with Dr. Barry Dienes who will consult.  It appears the patient was started on Cipro/Flagyl and has been compliant over the last 3 to 4 days.  Ideally would do Zosyn but patient with a penicillin allergy covered with aztreonam and Flagyl.  Plan to replace potassium as well and call family medicine for admission. No NG tube at this time as small bowel is decompressed.   Discussed patient's  case with Family Medicine to request admission. Patient and family (if present) updated with plan. Care transferred to [facility] Medicine service.  I reviewed all nursing notes, vitals, pertinent old records, EKGs, labs, imaging (as available).  ____________________________________________  FINAL CLINICAL IMPRESSION(S) / ED DIAGNOSES  Final diagnoses:  Colon obstruction ([place])  Precordial chest pain  Pneumatosis intestinalis      MEDICATIONS GIVEN DURING THIS VISIT:  Medications  iopamidol (ISOVUE-370) 76 % injection (has no administration in time range)  metroNIDAZOLE (FLAGYL) IVPB 500 mg (0 mg Intravenous Stopped 11/22/18 2315)  aztreonam (AZACTAM) 2 g in sodium chloride 0.9 % 100 mL IVPB (has no administration in time range)  ondansetron (ZOFRAN) tablet 4 mg (has no administration in time range)    Or  ondansetron (ZOFRAN) injection 4 mg (has no administration in time range)  potassium chloride 10 mEq in 100 mL IVPB (10 mEq Intravenous New Bag/Given 11/22/18 2332)  sodium chloride 0.9 % bolus 1,000 mL (0 mLs Intravenous Stopped 11/22/18 2007)  ondansetron (ZOFRAN) injection 4 mg (4 mg Intravenous Given 11/22/18 1617)  ketorolac (TORADOL) 30 MG/ML injection 30 mg (30 mg Intravenous Given 11/22/18 1723)  iopamidol (ISOVUE-370) 76 % injection 100 mL (100 mLs Intravenous Contrast Given 11/22/18 1858)  morphine 4 MG/ML injection 4 mg (4 mg Intravenous Given 11/22/18 2041)  aztreonam (AZACTAM) 2 g in sodium chloride 0.9 % 100 mL IVPB (0 g Intravenous Stopped 11/22/18 2131)    Note:  This document was prepared using Dragon voice recognition software and may include unintentional dictation errors.  Nanda Quinton, MD Emergency Medicine    Long, [facility], MD 11/22/18 [phone]

## 2018-11-22 NOTE — ED Notes (Signed)
Pt returned from CT °

## 2018-11-22 NOTE — Patient Instructions (Signed)
Patient sent to ED via EMS for possible acute MI

## 2018-11-22 NOTE — ED Triage Notes (Signed)
Pt started having tight mid sternal to left sided chest pain today around 11am. This occurred after pt started to have severe back pain/spasms. Pt went to Dr. Gabriel Carina today for a wellness check up when he told the office he had chest pain. Office gave pt 1 nitro and 325 aspirin. Pt BP dropped from 110/60 to 90/50 after first aspirin. Pain went from 9 to a 2. Chest pain is currently at an 8. EMS gave pt 551mL of NS.

## 2018-11-22 NOTE — H&P (Addendum)
[facility] Admission History and Physical Service Pager: [phone]  Patient name: [name] Medical record number: [id number] Date of birth: September 11, 1981 Age: 38 y.o. Gender: male  Primary Care Provider: Lovenia Kim, MD Consultants: surgery, GI Code Status: full  Chief Complaint: abdominal pain  Assessment and Plan: [name] is a 38 y.o. male presenting with abdominal pain concerning for bowel obstruction. PMH is significant for Bipolar disorder, HLD, substance abuse, beckers muscular dystrophy,   Large bowel obstruction w/ possible colonic mass - patient complains of Two weeks of constipation, one week of abdominal pain that started in the suprapubic region and spread diffusely with radiation to the back.  4 days history of not eating, except for one episode in which he vomited it immediately up. One day history of 'chest pain' and diaphoresis. He had one episode of dark black diarrhea a few days ago. He was given antibiotics in the ED approximately 5 days ago for probable diverticulitis with no improvement. In the ED, his Troponin was negative, LA negative. Patient afebrile with no leukocytosis. CXR negative for cardiopulmonary processes. D-dimer was elevated, so patient received CT angio chest which did not show PE, but did show bowel wall thickening. He then received CT Abd/Pelvis which showed thickening of the sigmoid colon and bladder wall which could represent diverticulitis with fistula, but also suspicious for cancer.  A proximal colonic obstruction with colonic pneumatosis, concerning for possible bowel necrosis, was also noted. Surgery was consulted and recommended antibiotics and likely surgery, but not urgently. They did recommend GI consult. Differential for symptoms and CT findings include diverticulitis w/ possible colovesicular fistula, malignancy.  No known FH of colon cancer. - admit to inpatient, med surg.  Dr. Nori Riis attending.  - General surgery  consulted appreciate recs - consult GI - NPO, sips with meds - f/u CEA - AM CBC, BMP - continuous cardiac monitoring - continuous pulse ox - vitals per routine - FOBT  Chest Pain Now resolved. Troponin negative. CT chest negative for PE. EKG from clinic with some nonspecific T wave changes. CXR with slight atelectasis but thought to be 2/2 to effort. Could have some component of referred pain from abdomen. - continue to monitor  Dysuria - patient has complained of dysuria over the past week and increased urinary frequency for the past several weeks.  On exam he had left sided CVA tenderness.  Urinalysis was ordered but patient has been on antibiotics for several days now.   - f/u urinalysis  Borderline DM-II - patient has had A1c of 6.3 and 6.1 last April and November, respectively.  Patient stated he tried metformin before but is allergic to it.   - f/u A1c.   HLD - patient takes lipitor '40mg'$  at home.  Lipid panel on 01/2018 shoed LDL of 169, increased from 153 in 2017.   - continue lipitor - consider increasing dose for better control  Bipolar disorder - patient takes latuda '80mg'$  at bedtime. Patient takes viibryd '40mg'$  daily.  lamotrigine '50mg'$  qdaily listed but patient does not appear to be taking currently - continue latuda - continue viibryd  ADHD - patient takes vyvanse '50mg'$  w/ breakfast, '20mg'$  at lunch.  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 He takes gabapentin '300mg'$  qdaily a home.   - continue gabapentin  Beckers Muscular dystrophy - patient was diagnosed with this several years ago with notes dating back to 2008 mentioning it.  Neurology note from 2015  states he does not have any associated muscular weakness.  No muscular weakness noted on exam today.   GERD - patient takes '40mg'$  prilosec at home  Insomnia - patient takes '50mg'$  trazodone. Not currently taking melatonin. - continue trazadone  Hypokalemia - patient potassium level  was 2.9 today.   - KCl 6mq every hour x 4.  - am BMP   FEN/GI: NPO, replete K Prophylaxis: heparin subQ.   Disposition: med-surg.    History of Present Illness:  Victor FAULKENBERRYis a 38y.o. male presenting with abdominal pain.  Patient complained of N/V x 2-3 days 2-3 weeks ago that then subsided. Son had the flu during this time. This resolved and then patient started to develop suprapubic abdominal pain one week ago that progressed to become more diffuse and radiated to his back about 4 days ago.  [facility] and was given antibiotics.  He has endorsed 'fevers' of 99 degrees during this time.  His symptoms did not improve with the antibiotics. He has not eaten anything in the last four days except for one time where he immediately vomited up his food.  He has not had a bowel movement in two weeks except for the other day when he had 'black' diarrhea. Today at work he became diaphoretic and had chest pain.  He was seen in our clinic and given sublingual nitrogen and sent to the ED for possible ST changes on EKG. He denies chest palpiations.  Chest pain is now resolved. He has been having night sweats for 3 days.     He also complains of dysuria for the past week and increased urination for a month.  He says his urine is dark brown.    Smokes 1ppd for the past 25 years. Former drinker. Former illicit drug use 15 years ago, MJ, cocaine. Dad and dad's brothers died of MI, unsure of age. No FH of Celiac, Crohn's Disease, colon cancer.  Review Of Systems: Per HPI with the following additions:   Review of Systems  Constitutional: Positive for diaphoresis. Negative for chills and fever.  Respiratory: Negative for cough.   Cardiovascular: Negative for chest pain, palpitations and leg swelling.  Gastrointestinal: Positive for abdominal pain, diarrhea, nausea and vomiting.  Genitourinary: Positive for dysuria, frequency and urgency.   Skin: Negative for rash.  Neurological: Positive for dizziness and headaches.    Patient Active Problem List   Diagnosis Date Noted  . Other chest pain 11/22/2018  . Abdominal pain 11/22/2018  . Memory difficulty 10/04/2018  . Dysuria 09/10/2018  . Erectile dysfunction 01/29/2018  . Rash and nonspecific skin eruption 05/10/2017  . Neck pain 11/12/2016  . Back pain 10/25/2016  . Sleep difficulties 10/25/2016  . Allergic rhinitis 04/28/2016  . Dizzy 12/07/2015  . Type 2 diabetes mellitus without complication ([place] 011/91/4782 . GERD (gastroesophageal reflux disease) 03/21/2013  . Headache 01/31/2013  . Gait disorder 01/31/2013  . Elevated BP 04/25/2012  . Narcotic abuse ([place] 06/05/2011  . OBESITY 10/17/2010  . 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 SLEEP APNEA 01/24/2009  . BIPOLAR DISORDER UNSPECIFIED 05/05/2008  . HYPERLIPIDEMIA 03/30/2008  . ABUSE, ALCOHOL, UNSPECIFIED 04/26/2007  . TOBACCO ABUSE 12/25/2006    Past Medical History: Past Medical History:  Diagnosis Date  . Allergy   . Anxiety   . Becker's muscular dystrophy ([place]   . Bipolar disorder ([place]    On disability  . Depression   .  Diabetes mellitus without complication ([place])   . Headache(784.0)   . Hyperlipidemia   . Neuromuscular disorder ([place])    Beckers muscular dystrophy  . Obesity   . Sleep apnea    Does not tolerate CPAP    Past Surgical History: Past Surgical History:  Procedure Laterality Date  . NM MYOCAR PERF WALL MOTION  01/22/2011   protocol: Persantine, moderate reversible inferior defect post stress EF 48%, high risk scan  . TRANSTHORACIC ECHOCARDIOGRAM  07/05/2004   EF=>55% normal study     Social History: Social History   Tobacco Use  . Smoking status: Current Every Day Smoker    Packs/day: 1.00    Years: 21.00    Pack years: 21.00    Types: Cigarettes  . Smokeless tobacco: Never Used  . Tobacco comment: Reports using vapor cigarettes currently trying to quit  Substance Use  Topics  . Alcohol use: No    Alcohol/week: 0.0 standard drinks    Comment: quit 2013  . Drug use: No   Additional social history: lives alone Please also refer to relevant sections of EMR.  Family History: Family History  Problem Relation Age of Onset  . COPD Mother   . Depression Mother   . Diabetes Mother   . Hyperlipidemia Mother   . Asthma Father   . Arthritis Father   . Diabetes Father   . Heart disease Father   . Hyperlipidemia Father   . Hypertension Father   . Hypertension Brother      Allergies and Medications: Allergies  Allergen Reactions  . Penicillins Anaphylaxis, Shortness Of Breath and Swelling    Did it involve swelling of the face/tongue/throat, SOB, or low BP? Yes Did it involve sudden or severe rash/hives, skin peeling, or any reaction on the inside of your mouth or nose? Unk Did you need to seek medical attention at a hospital or doctor's office? No When did it last happen?Unk If all above answers are "NO", may proceed with cephalosporin use.   . Metformin And Related Other (See Comments)    Stomach cramps    No current facility-administered medications on file prior to encounter.    Current Outpatient Medications on File Prior to Encounter  Medication Sig Dispense Refill  . acetaminophen (TYLENOL) 500 MG tablet Take 500-1,000 mg by mouth every 6 (six) hours as needed (for pain).    Marland Kitchen atorvastatin (LIPITOR) 40 MG tablet Take 1 tablet (40 mg total) by mouth daily. (Patient taking differently: Take 40 mg by mouth at bedtime. ) 90 tablet 1  . ciprofloxacin (CIPRO) 500 MG tablet Take 500 mg by mouth 2 (two) times daily.     . diphenhydramine-acetaminophen (TYLENOL PM) 25-500 MG TABS tablet Take 3 tablets by mouth at bedtime.    . gabapentin (NEURONTIN) 300 MG capsule Take 300 mg by mouth daily.   1  . ibuprofen (ADVIL,MOTRIN) 400 MG tablet Take 400 mg by mouth every 8 (eight) hours as needed (for pain).     Marland Kitchen LATUDA 80 MG TABS tablet Take 80 mg by  mouth at bedtime.     . metroNIDAZOLE (FLAGYL) 500 MG tablet Take 500 mg by mouth 2 (two) times daily.     Marland Kitchen omeprazole (PRILOSEC) 40 MG capsule TAKE 1 CAPSULE BY MOUTH EVERY DAY (Patient taking differently: Take 40 mg by mouth at bedtime. ) 90 capsule 0  . ondansetron (ZOFRAN-ODT) 4 MG disintegrating tablet Take 4 mg by mouth every 8 (eight) hours as needed for nausea or  vomiting.     . traZODone (DESYREL) 50 MG tablet Take 50 mg by mouth at bedtime.     Marland Kitchen VIIBRYD 40 MG TABS Take 40 mg by mouth daily.    Marland Kitchen VYVANSE 20 MG capsule Take 20 mg by mouth daily with lunch.     Marland Kitchen VYVANSE 50 MG capsule Take 50 mg by mouth daily with breakfast.     . ACCU-CHEK FASTCLIX LANCETS MISC Check blood sugar once daily 102 each 3  . Blood Glucose Monitoring Suppl (ACCU-CHEK NANO SMARTVIEW) W/DEVICE KIT Check blood sugar once daily 1 kit 0  . glucose blood (ACCU-CHEK SMARTVIEW) test strip Check blood sugar once daily 100 each 3  . lamoTRIgine (LAMICTAL) 25 MG tablet Take 1 tab daily for 1 week and than 2 tab daily (Patient not taking: Reported on 11/22/2018) 60 tablet 2  . Melatonin 10 MG TABS Take 5 mg by mouth at bedtime. May increase to 10 mg nightly if needed. (Patient not taking: Reported on 11/22/2018) 60 tablet 2  . meloxicam (MOBIC) 15 MG tablet Take 1 tablet (15 mg total) by mouth daily. 30 tablet 0    Objective: BP 130/72 (BP Location: Right Arm)   Pulse (!) 106   Temp 98.9 F (37.2 C) (Oral)   Resp 18   Ht '5\' 8"'$  (1.727 m)   Wt 112.5 kg   SpO2 98%   BMI 37.71 kg/m  Exam: General: alert and oriented.  Standing up.  No acute distress.  Eyes: PERRL.  EOMI.  No scleral icterus.  ENTM: moist oral mucosa.  Uvula midline.  Neck: no thyromegaly Cardiovascular: regular rate and rhythm. No murmurs.  2+ pulses bilaterally.   Respiratory: mild wheezing bilaterally. Normal work of breathing.   Gastrointestinal: mildly distended, obese abdomen.  Mildly tender to palpation diffusely.  Decreased bowel sounds.   MSK: upper and lower extremity strength equal bilaterally.  Derm: no rashes. Skin warm and dry.  Neuro: cranial nerves grossly intact.  Psych: pleasant affect. Answers to questions sometimes contradicted themselves, I.e. stating he did not take home medications then stating he does.    Labs and Imaging: CBC BMET  Recent Labs  Lab 11/22/18 1549  WBC 9.8  HGB 14.8  HCT 42.2  PLT 239   Recent Labs  Lab 11/22/18 1700  NA 136  K 2.9*  CL 97*  CO2 23  BUN 12  CREATININE 1.15  GLUCOSE 104*  CALCIUM 8.9     Dg Chest 2 View  Result Date: 11/22/2018 CLINICAL DATA:  Severe chest pain. EXAM: CHEST - 2 VIEW COMPARISON:  Chest x-ray dated 04/26/2007 and abdominal radiographs dated 11/19/2018 FINDINGS: The heart size and pulmonary vascularity are normal. There is slight atelectasis at the left lung base. No consolidative infiltrates or effusions. No bone abnormality. Slight distention of the colon. IMPRESSION: Slight atelectasis at the left lung base which may be due to a shallow inspiration. Increased gaseous distention of the colon since 11/19/2018. Electronically Signed   By: Lorriane Shire M.D.   On: 11/22/2018 16:39   Ct Angio Chest Pe W And/or Wo Contrast  Result Date: 11/22/2018 CLINICAL DATA:  Type midsternal and left-sided chest pain beginning today at 11 a.m. Back pain and spasms. Generalized abdominal pain. EXAM: CT ANGIOGRAPHY CHEST CT ABDOMEN AND PELVIS WITH CONTRAST TECHNIQUE: Multidetector CT imaging of the chest was performed using the standard protocol during bolus administration of intravenous contrast. Multiplanar CT image reconstructions and MIPs were obtained to evaluate the vascular anatomy.  Multidetector CT imaging of the abdomen and pelvis was performed using the standard protocol during bolus administration of intravenous contrast. CONTRAST:  119m ISOVUE-370 IOPAMIDOL (ISOVUE-370) INJECTION 76% COMPARISON:  CT abdomen and pelvis 11/19/2018 FINDINGS: CTA CHEST FINDINGS  Cardiovascular: There is good opacification of the central and segmental pulmonary arteries. No focal filling defects are demonstrated. No evidence of significant pulmonary embolus. Normal heart size. No pericardial effusions. Normal caliber thoracic aorta. No dissection. Great vessel origins are patent. Mediastinum/Nodes: Mediastinal lymph nodes are not pathologically enlarged. Esophagus is decompressed. Lungs/Pleura: Motion artifact limits evaluation. There is some linear atelectasis in the left mid lung. Mosaic attenuation pattern to the lungs is probably due to motion artifact but could also indicate air trapping or edema. No pleural effusions. No pneumothorax. Airways are patent. Musculoskeletal: No chest wall abnormality. No acute or significant osseous findings. Review of the MIP images confirms the above findings. CT ABDOMEN and PELVIS FINDINGS Hepatobiliary: No focal liver abnormality is seen. No gallstones, gallbladder wall thickening, or biliary dilatation. Pancreas: Unremarkable. No pancreatic ductal dilatation or surrounding inflammatory changes. Spleen: Normal in size without focal abnormality. Adrenals/Urinary Tract: Adrenal glands are unremarkable. Kidneys are normal, without renal calculi, focal lesion, or hydronephrosis. Focal bladder wall thickening superiorly, see below. Stomach/Bowel: Stomach and small bowel are decompressed. Segmental wall thickening of the sigmoid colon with abrupt shouldered margins and stranding in the adjacent fat. Adjacent to the colon, there is an enhancing area of infiltration contiguous with the bladder wall along with bladder wall thickening. This could represent diverticulitis with fistula formation although the abrupt margins of the lesion are suspicious for colon cancer in which case this would be locally invasive. There is evidence of proximal colonic obstruction with interval development of moderate colonic distention with gas and air-fluid levels. Peripheral gas  along the nondependent surfaces in the right colon indicates pneumatosis. No portal venous gas is identified although there is mesenteric infiltration and edema. There is also edema in the pericolic gutters and pelvis. Vascular/Lymphatic: Scattered aortic calcifications. No aneurysm. No significant lymphadenopathy. Reproductive: Prostate is unremarkable. Other: Small amount of free fluid in the abdomen and pelvis, progressing since previous study. No free air. Abdominal wall musculature appears intact. Musculoskeletal: Mild degenerative changes in the spine. No destructive bone lesions. Review of the MIP images confirms the above findings. IMPRESSION: 1. No evidence of significant pulmonary embolus. 2. Linear atelectasis in the left lung. 3. Mosaic attenuation pattern to the lungs is probably due to motion artifact but could indicate air trapping or edema. 4. Segmental wall thickening of the sigmoid colon with abrupt shouldered margins and stranding in the adjacent fat. Adjacent to the colon, there is an enhancing area of infiltration contiguous with the bladder wall along with bladder wall thickening. This could represent diverticulitis with fistula formation although the abrupt margins of the lesion are suspicious for colon cancer in which case this would be locally invasive. Surgical consultation is recommended. 5. Interval development of proximal colonic obstruction with gas and fluid levels. 6. Interval development of right colonic pneumatosis. This may be related to obstruction or could indicate developing bowel necrosis. 7. Small amount of free fluid in the abdomen and pelvis, progressing since previous study. These results were called by telephone at the time of interpretation on 11/22/2018 at 7:47 pm to Dr. JNanda Quinton, who verbally acknowledged these results. Aortic Atherosclerosis (ICD10-I70.0). Electronically Signed   By: WLucienne CapersM.D.   On: 11/22/2018 19:51   Ct Abdomen Pelvis W  Contrast  Result Date: 11/22/2018 CLINICAL DATA:  Type midsternal and left-sided chest pain beginning today at 11 a.m. Back pain and spasms. Generalized abdominal pain. EXAM: CT ANGIOGRAPHY CHEST CT ABDOMEN AND PELVIS WITH CONTRAST TECHNIQUE: Multidetector CT imaging of the chest was performed using the standard protocol during bolus administration of intravenous contrast. Multiplanar CT image reconstructions and MIPs were obtained to evaluate the vascular anatomy. Multidetector CT imaging of the abdomen and pelvis was performed using the standard protocol during bolus administration of intravenous contrast. CONTRAST:  139m ISOVUE-370 IOPAMIDOL (ISOVUE-370) INJECTION 76% COMPARISON:  CT abdomen and pelvis 11/19/2018 FINDINGS: CTA CHEST FINDINGS Cardiovascular: There is good opacification of the central and segmental pulmonary arteries. No focal filling defects are demonstrated. No evidence of significant pulmonary embolus. Normal heart size. No pericardial effusions. Normal caliber thoracic aorta. No dissection. Great vessel origins are patent. Mediastinum/Nodes: Mediastinal lymph nodes are not pathologically enlarged. Esophagus is decompressed. Lungs/Pleura: Motion artifact limits evaluation. There is some linear atelectasis in the left mid lung. Mosaic attenuation pattern to the lungs is probably due to motion artifact but could also indicate air trapping or edema. No pleural effusions. No pneumothorax. Airways are patent. Musculoskeletal: No chest wall abnormality. No acute or significant osseous findings. Review of the MIP images confirms the above findings. CT ABDOMEN and PELVIS FINDINGS Hepatobiliary: No focal liver abnormality is seen. No gallstones, gallbladder wall thickening, or biliary dilatation. Pancreas: Unremarkable. No pancreatic ductal dilatation or surrounding inflammatory changes. Spleen: Normal in size without focal abnormality. Adrenals/Urinary Tract: Adrenal glands are unremarkable. Kidneys  are normal, without renal calculi, focal lesion, or hydronephrosis. Focal bladder wall thickening superiorly, see below. Stomach/Bowel: Stomach and small bowel are decompressed. Segmental wall thickening of the sigmoid colon with abrupt shouldered margins and stranding in the adjacent fat. Adjacent to the colon, there is an enhancing area of infiltration contiguous with the bladder wall along with bladder wall thickening. This could represent diverticulitis with fistula formation although the abrupt margins of the lesion are suspicious for colon cancer in which case this would be locally invasive. There is evidence of proximal colonic obstruction with interval development of moderate colonic distention with gas and air-fluid levels. Peripheral gas along the nondependent surfaces in the right colon indicates pneumatosis. No portal venous gas is identified although there is mesenteric infiltration and edema. There is also edema in the pericolic gutters and pelvis. Vascular/Lymphatic: Scattered aortic calcifications. No aneurysm. No significant lymphadenopathy. Reproductive: Prostate is unremarkable. Other: Small amount of free fluid in the abdomen and pelvis, progressing since previous study. No free air. Abdominal wall musculature appears intact. Musculoskeletal: Mild degenerative changes in the spine. No destructive bone lesions. Review of the MIP images confirms the above findings. IMPRESSION: 1. No evidence of significant pulmonary embolus. 2. Linear atelectasis in the left lung. 3. Mosaic attenuation pattern to the lungs is probably due to motion artifact but could indicate air trapping or edema. 4. Segmental wall thickening of the sigmoid colon with abrupt shouldered margins and stranding in the adjacent fat. Adjacent to the colon, there is an enhancing area of infiltration contiguous with the bladder wall along with bladder wall thickening. This could represent diverticulitis with fistula formation although the  abrupt margins of the lesion are suspicious for colon cancer in which case this would be locally invasive. Surgical consultation is recommended. 5. Interval development of proximal colonic obstruction with gas and fluid levels. 6. Interval development of right colonic pneumatosis. This may be related to obstruction or could indicate developing bowel necrosis.  7. Small amount of free fluid in the abdomen and pelvis, progressing since previous study. These results were called by telephone at the time of interpretation on 11/22/2018 at 7:47 pm to Dr. Nanda Quinton , who verbally acknowledged these results. Aortic Atherosclerosis (ICD10-I70.0). Electronically Signed   By: Lucienne Capers M.D.   On: 11/22/2018 19:51   Benay Pike, MD 11/23/2018, 1:12 AM PGY-1, [place] [facility] pager: [phone], text pages welcome  FPTS Upper-Level Resident Addendum   I have independently interviewed and examined the patient. I have discussed the above with the original author and agree with their documentation. My edits for correction/addition/clarification are in green. Please see also any attending notes.    Rory Percy, DO PGY-2, [facility] 11/23/2018 1:25 AM  FPTS Service pager: [phone] (text pages welcome through [facility])

## 2018-11-22 NOTE — Consult Note (Signed)
Reason for Consult:Large bowel obstruction Referring Physician: [name] is an 38 y.o. male.  HPI:  Patient is a 38 year old male who presents with approximately a week of worsening distention and pain.  He was seen approximately a week ago and diagnosed with probable diverticulitis.  He was placed on antibiotics but has not had any improvement.  Today he started having chest pain and sought help again.  He has had a bowel movement today.  He has had nausea and vomiting.  His abdominal pain has not been worsening.  Because of his chest tightness, EMS treated him with nitroglycerin.  This did not change his chest pain symptoms and his blood pressure dropped to 90/50.  He has no known history of heart disease.  He does describe having some constipation that has worsened.  Past Medical History:  Diagnosis Date  . Allergy   . Anxiety   . 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 Bipolar disorder ([place])    On disability  . Depression   . Diabetes mellitus without complication ([place])   . Headache(784.0)   . Hyperlipidemia   . Neuromuscular disorder ([place])    Beckers muscular dystrophy  . Obesity   . Sleep apnea    Does not tolerate CPAP    Past Surgical History:  Procedure Laterality Date  . NM MYOCAR PERF WALL MOTION  01/22/2011   protocol: Persantine, moderate reversible inferior defect post stress EF 48%, high risk scan  . TRANSTHORACIC ECHOCARDIOGRAM  07/05/2004   EF=>55% normal study     Family History  Problem Relation Age of Onset  . COPD Mother   . Depression Mother   . Diabetes Mother   . Hyperlipidemia Mother   . Asthma Father   . Arthritis Father   . Diabetes Father   . Heart disease Father   . Hyperlipidemia Father   . Hypertension Father   . Hypertension Brother     Social History:  reports that he has been smoking cigarettes. He has a 21.00 pack-year smoking history. He has never used smokeless tobacco. He reports that he does not drink alcohol or use  drugs.  Allergies:  Allergies  Allergen Reactions  . Penicillins Shortness Of Breath  . Metformin And Related Other (See Comments)    Stomach cramps     Medications:   ACCU-CHEK FASTCLIX LANCETS MISC    atorvastatin (LIPITOR) 40 MG tablet    Blood Glucose Monitoring Suppl (ACCU-CHEK NANO SMARTVIEW) W/DEVICE KIT    gabapentin (NEURONTIN) 300 MG capsule    glucose blood (ACCU-CHEK SMARTVIEW) test strip    lamoTRIgine (LAMICTAL) 25 MG tablet    LATUDA 40 MG TABS tablet    Melatonin 10 MG TABS    meloxicam (MOBIC) 15 MG tablet    omeprazole (PRILOSEC) 40 MG capsule    traZODone (DESYREL) 50 MG tablet    VYVANSE 20 MG capsule    VYVANSE 50 MG capsule      Results for orders placed or performed during the hospital encounter of 11/22/18 (from the past 48 hour(s))  CBC     Status: None   Collection Time: 11/22/18  3:49 PM  Result Value Ref Range   WBC 9.8 4.0 - 10.5 K/uL   RBC 4.96 4.22 - 5.81 MIL/uL   Hemoglobin 14.8 13.0 - 17.0 g/dL   HCT 42.2 39.0 - 52.0 %   MCV 85.1 80.0 - 100.0 fL   MCH 29.8 26.0 - 34.0 pg   MCHC 35.1  30.0 - 36.0 g/dL   RDW 13.4 11.5 - 15.5 %   Platelets 239 150 - 400 K/uL   nRBC 0.0 0.0 - 0.2 %    Comment: Performed at [facility], [place] [address] [place], [place] [address]  Lipase, blood     Status: None   Collection Time: 11/22/18  4:34 PM  Result Value Ref Range   Lipase 21 11 - 51 U/L    Comment: Performed at [facility] [address] [place], [place] [address]  Hepatic function panel     Status: Abnormal   Collection Time: 11/22/18  4:34 PM  Result Value Ref Range   Total Protein 7.6 6.5 - 8.1 g/dL   Albumin 3.9 3.5 - 5.0 g/dL   AST 43 (H) 15 - 41 U/L   ALT 35 0 - 44 U/L   Alkaline Phosphatase 67 38 - 126 U/L   Total Bilirubin 0.9 0.3 - 1.2 mg/dL   Bilirubin, Direct 0.3 (H) 0.0 - 0.2 mg/dL   Indirect Bilirubin 0.6 0.3 - 0.9 mg/dL    Comment: Performed at [facility] [address] [place], [place] [address]   Troponin I - ONCE - STAT     Status: None   Collection Time: 11/22/18  4:34 PM  Result Value Ref Range   Troponin I <0.03 <0.03 ng/mL    Comment: Performed at [facility], [place] [address] [place], [place] [address]  Lactic acid, plasma     Status: None   Collection Time: 11/22/18  4:45 PM  Result Value Ref Range   Lactic Acid, Venous 1.0 0.5 - 1.9 mmol/L    Comment: Performed at [facility] [address] [place], [place] [address]  D-dimer, quantitative (not at [facility])     Status: Abnormal   Collection Time: 11/22/18  4:45 PM  Result Value Ref Range   D-Dimer, Quant 5.74 (H) 0.00 - 0.50 ug/mL-FEU    Comment: (NOTE) At the manufacturer cut-off of 0.50 ug/mL FEU, this assay has been documented to exclude PE with a sensitivity and negative predictive value of 97 to 99%.  At this time, this assay has not been approved by the FDA to exclude DVT/VTE. Results should be correlated with clinical presentation. Performed at [facility], [place] [address], [place], [place] [address]   Basic metabolic panel     Status: Abnormal   Collection Time: 11/22/18  5:00 PM  Result Value Ref Range   Sodium 136 135 - 145 mmol/L   Potassium 2.9 (L) 3.5 - 5.1 mmol/L   Chloride 97 (L) 98 - 111 mmol/L   CO2 23 22 - 32 mmol/L   Glucose, Bld 104 (H) 70 - 99 mg/dL   BUN 12 6 - 20 mg/dL   Creatinine, Ser 1.15 0.61 - 1.24 mg/dL   Calcium 8.9 8.9 - 10.3 mg/dL   GFR calc non Af Amer >60 >60 mL/min   GFR calc Af Amer >60 >60 mL/min   Anion gap 16 (H) 5 - 15    Comment: Performed at [facility], [place] [address], [place], [place] [address]    Dg Chest 2 View  Result Date: 11/22/2018 CLINICAL DATA:  Severe chest pain. EXAM: CHEST - 2 VIEW COMPARISON:  Chest x-ray dated 04/26/2007 and abdominal radiographs dated 11/19/2018 FINDINGS: The heart size and pulmonary vascularity are normal. There is slight atelectasis at the left lung base. No consolidative infiltrates or  effusions. No bone  abnormality. Slight distention of the colon. IMPRESSION: Slight atelectasis at the left lung base which may be due to a shallow inspiration. Increased gaseous distention of the colon since 11/19/2018. Electronically Signed   By: Lorriane Shire M.D.   On: 11/22/2018 16:39   Ct Angio Chest Pe W And/or Wo Contrast  Result Date: 11/22/2018 CLINICAL DATA:  Type midsternal and left-sided chest pain beginning today at 11 a.m. Back pain and spasms. Generalized abdominal pain. EXAM: CT ANGIOGRAPHY CHEST CT ABDOMEN AND PELVIS WITH CONTRAST TECHNIQUE: Multidetector CT imaging of the chest was performed using the standard protocol during bolus administration of intravenous contrast. Multiplanar CT image reconstructions and MIPs were obtained to evaluate the vascular anatomy. Multidetector CT imaging of the abdomen and pelvis was performed using the standard protocol during bolus administration of intravenous contrast. CONTRAST:  128m ISOVUE-370 IOPAMIDOL (ISOVUE-370) INJECTION 76% COMPARISON:  CT abdomen and pelvis 11/19/2018 FINDINGS: CTA CHEST FINDINGS Cardiovascular: There is good opacification of the central and segmental pulmonary arteries. No focal filling defects are demonstrated. No evidence of significant pulmonary embolus. Normal heart size. No pericardial effusions. Normal caliber thoracic aorta. No dissection. Great vessel origins are patent. Mediastinum/Nodes: Mediastinal lymph nodes are not pathologically enlarged. Esophagus is decompressed. Lungs/Pleura: Motion artifact limits evaluation. There is some linear atelectasis in the left mid lung. Mosaic attenuation pattern to the lungs is probably due to motion artifact but could also indicate air trapping or edema. No pleural effusions. No pneumothorax. Airways are patent. Musculoskeletal: No chest wall abnormality. No acute or significant osseous findings. Review of the MIP images confirms the above findings. CT ABDOMEN and PELVIS FINDINGS Hepatobiliary: No  focal liver abnormality is seen. No gallstones, gallbladder wall thickening, or biliary dilatation. Pancreas: Unremarkable. No pancreatic ductal dilatation or surrounding inflammatory changes. Spleen: Normal in size without focal abnormality. Adrenals/Urinary Tract: Adrenal glands are unremarkable. Kidneys are normal, without renal calculi, focal lesion, or hydronephrosis. Focal bladder wall thickening superiorly, see below. Stomach/Bowel: Stomach and small bowel are decompressed. Segmental wall thickening of the sigmoid colon with abrupt shouldered margins and stranding in the adjacent fat. Adjacent to the colon, there is an enhancing area of infiltration contiguous with the bladder wall along with bladder wall thickening. This could represent diverticulitis with fistula formation although the abrupt margins of the lesion are suspicious for colon cancer in which case this would be locally invasive. There is evidence of proximal colonic obstruction with interval development of moderate colonic distention with gas and air-fluid levels. Peripheral gas along the nondependent surfaces in the right colon indicates pneumatosis. No portal venous gas is identified although there is mesenteric infiltration and edema. There is also edema in the pericolic gutters and pelvis. Vascular/Lymphatic: Scattered aortic calcifications. No aneurysm. No significant lymphadenopathy. Reproductive: Prostate is unremarkable. Other: Small amount of free fluid in the abdomen and pelvis, progressing since previous study. No free air. Abdominal wall musculature appears intact. Musculoskeletal: Mild degenerative changes in the spine. No destructive bone lesions. Review of the MIP images confirms the above findings. IMPRESSION: 1. No evidence of significant pulmonary embolus. 2. Linear atelectasis in the left lung. 3. Mosaic attenuation pattern to the lungs is probably due to motion artifact but could indicate air trapping or edema. 4. Segmental  wall thickening of the sigmoid colon with abrupt shouldered margins and stranding in the adjacent fat. Adjacent to the colon, there is an enhancing area of infiltration contiguous with the bladder wall along with bladder wall thickening. This could represent  diverticulitis with fistula formation although the abrupt margins of the lesion are suspicious for colon cancer in which case this would be locally invasive. Surgical consultation is recommended. 5. Interval development of proximal colonic obstruction with gas and fluid levels. 6. Interval development of right colonic pneumatosis. This may be related to obstruction or could indicate developing bowel necrosis. 7. Small amount of free fluid in the abdomen and pelvis, progressing since previous study. These results were called by telephone at the time of interpretation on 11/22/2018 at 7:47 pm to Dr. Nanda Quinton , who verbally acknowledged these results. Aortic Atherosclerosis (ICD10-I70.0). Electronically Signed   By: Lucienne Capers M.D.   On: 11/22/2018 19:51   Ct Abdomen Pelvis W Contrast  Result Date: 11/22/2018 CLINICAL DATA:  Type midsternal and left-sided chest pain beginning today at 11 a.m. Back pain and spasms. Generalized abdominal pain. EXAM: CT ANGIOGRAPHY CHEST CT ABDOMEN AND PELVIS WITH CONTRAST TECHNIQUE: Multidetector CT imaging of the chest was performed using the standard protocol during bolus administration of intravenous contrast. Multiplanar CT image reconstructions and MIPs were obtained to evaluate the vascular anatomy. Multidetector CT imaging of the abdomen and pelvis was performed using the standard protocol during bolus administration of intravenous contrast. CONTRAST:  18m ISOVUE-370 IOPAMIDOL (ISOVUE-370) INJECTION 76% COMPARISON:  CT abdomen and pelvis 11/19/2018 FINDINGS: CTA CHEST FINDINGS Cardiovascular: There is good opacification of the central and segmental pulmonary arteries. No focal filling defects are demonstrated. No  evidence of significant pulmonary embolus. Normal heart size. No pericardial effusions. Normal caliber thoracic aorta. No dissection. Great vessel origins are patent. Mediastinum/Nodes: Mediastinal lymph nodes are not pathologically enlarged. Esophagus is decompressed. Lungs/Pleura: Motion artifact limits evaluation. There is some linear atelectasis in the left mid lung. Mosaic attenuation pattern to the lungs is probably due to motion artifact but could also indicate air trapping or edema. No pleural effusions. No pneumothorax. Airways are patent. Musculoskeletal: No chest wall abnormality. No acute or significant osseous findings. Review of the MIP images confirms the above findings. CT ABDOMEN and PELVIS FINDINGS Hepatobiliary: No focal liver abnormality is seen. No gallstones, gallbladder wall thickening, or biliary dilatation. Pancreas: Unremarkable. No pancreatic ductal dilatation or surrounding inflammatory changes. Spleen: Normal in size without focal abnormality. Adrenals/Urinary Tract: Adrenal glands are unremarkable. Kidneys are normal, without renal calculi, focal lesion, or hydronephrosis. Focal bladder wall thickening superiorly, see below. Stomach/Bowel: Stomach and small bowel are decompressed. Segmental wall thickening of the sigmoid colon with abrupt shouldered margins and stranding in the adjacent fat. Adjacent to the colon, there is an enhancing area of infiltration contiguous with the bladder wall along with bladder wall thickening. This could represent diverticulitis with fistula formation although the abrupt margins of the lesion are suspicious for colon cancer in which case this would be locally invasive. There is evidence of proximal colonic obstruction with interval development of moderate colonic distention with gas and air-fluid levels. Peripheral gas along the nondependent surfaces in the right colon indicates pneumatosis. No portal venous gas is identified although there is mesenteric  infiltration and edema. There is also edema in the pericolic gutters and pelvis. Vascular/Lymphatic: Scattered aortic calcifications. No aneurysm. No significant lymphadenopathy. Reproductive: Prostate is unremarkable. Other: Small amount of free fluid in the abdomen and pelvis, progressing since previous study. No free air. Abdominal wall musculature appears intact. Musculoskeletal: Mild degenerative changes in the spine. No destructive bone lesions. Review of the MIP images confirms the above findings. IMPRESSION: 1. No evidence of significant pulmonary embolus. 2. Linear atelectasis  in the left lung. 3. Mosaic attenuation pattern to the lungs is probably due to motion artifact but could indicate air trapping or edema. 4. Segmental wall thickening of the sigmoid colon with abrupt shouldered margins and stranding in the adjacent fat. Adjacent to the colon, there is an enhancing area of infiltration contiguous with the bladder wall along with bladder wall thickening. This could represent diverticulitis with fistula formation although the abrupt margins of the lesion are suspicious for colon cancer in which case this would be locally invasive. Surgical consultation is recommended. 5. Interval development of proximal colonic obstruction with gas and fluid levels. 6. Interval development of right colonic pneumatosis. This may be related to obstruction or could indicate developing bowel necrosis. 7. Small amount of free fluid in the abdomen and pelvis, progressing since previous study. These results were called by telephone at the time of interpretation on 11/22/2018 at 7:47 pm to Dr. Nanda Quinton , who verbally acknowledged these results. Aortic Atherosclerosis (ICD10-I70.0). Electronically Signed   By: Lucienne Capers M.D.   On: 11/22/2018 19:51    Review of Systems  Constitutional: Positive for weight loss.  HENT: Negative.   Eyes: Negative.   Respiratory: Negative.   Cardiovascular: Positive for chest pain  (pressure).  Gastrointestinal: Positive for abdominal pain, constipation, nausea and vomiting. Negative for blood in stool, diarrhea and melena.  Genitourinary: Negative.   Musculoskeletal: Positive for back pain.  Skin: Negative.   Neurological:       Muscular dystrophy  Endo/Heme/Allergies: Negative.   Psychiatric/Behavioral: Positive for depression.      Blood pressure 121/65, pulse (!) 108, temperature 98.6 F (37 C), temperature source Oral, resp. rate 20, height '5\' 8"'$  (1.727 m), weight 112.5 kg, SpO2 98 %.    Physical Exam  Constitutional: He is oriented to person, place, and time. He appears well-developed and well-nourished. He appears distressed.  HENT:  Head: Normocephalic and atraumatic.  Right Ear: External ear normal.  Left Ear: External ear normal.  Nose: Nose normal.  Mouth/Throat: Oropharynx is clear and moist.  Earrings bilaterally  Eyes: Pupils are equal, round, and reactive to light. Conjunctivae are normal. Right eye exhibits no discharge. Left eye exhibits no discharge. No scleral icterus.  Neck: Normal range of motion. Neck supple. No JVD present. No tracheal deviation present. No thyromegaly present.  Cardiovascular: Regular rhythm, normal heart sounds and intact distal pulses. Exam reveals no gallop and no friction rub.  No murmur heard. Sl tachy  Respiratory: Effort normal and breath sounds normal. No stridor. No respiratory distress. He has no wheezes. He has no rales. He exhibits no tenderness.  GI: Soft. He exhibits distension. He exhibits no mass. There is no abdominal tenderness. There is no rebound and no guarding.  Hypoactive bowel sounds  Musculoskeletal: Normal range of motion.        General: No tenderness, deformity or edema.  Lymphadenopathy:    He has no cervical adenopathy.  Neurological: He is alert and oriented to person, place, and time. Coordination normal.  Skin: Skin is warm and dry. No rash noted. No erythema. No pallor.   Psychiatric: He has a normal mood and affect. His behavior is normal. Judgment and thought content normal.    Assessment/Plan: Large bowel obstruction ? Mass in sigmoid colon. Concern for cancer. Hypokalemia DM Sleep apnea Muscular dystrophy Bipolar disorder.  NPO GI consult Pt is non tender.  Does not need urgent exploration tonight.   Possible gentle enemas and flex sig Will likely get  hartmann's procedure Get CEA.   IV antibiotics for potential translocation.   Stark Klein 11/22/2018, 8:27 PM

## 2018-11-22 NOTE — Assessment & Plan Note (Addendum)
Patient given aspirin 325 in office, EKG shows some possible ST changes.  Will send patient to emergency room via EMS.   Started on oxygen here in office and also given sublingual nitro 0.4 mg x 1 for active chest pain.  EMS called to transport patient to ED for further work-up, called triage nurse and signed out to EMS.

## 2018-11-22 NOTE — Progress Notes (Signed)
  Subjective:    Patient ID: [name], male    DOB: 19-May-1981, 38 y.o.   MRN: [id number]   CC: chest pain  HPI:  Chest pain Patient reporting that he has had chest tightness and shortness of breath since 11:30 AM.  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  Patient states he is never had anything like this before.  States he is also been sweaty all day today.  Scribes pain as a tightness that goes across the left of his chest.  States that it does not radiate anywhere. Reports that he has took his Vyvanse today as well as his Lamictal.  Denies taking any aspirin at home. Patient reports father had multiple heart attacks as well as his uncles also have a history of CAD.  Smoking status reviewed  ROS: 10 point ROS is otherwise negative, except as mentioned in HPI  Patient Active Problem List   Diagnosis Date Noted  . Other chest pain 11/22/2018  . Memory difficulty 10/04/2018  . Dysuria 09/10/2018  . Erectile dysfunction 01/29/2018  . Rash and nonspecific skin eruption 05/10/2017  . Neck pain 11/12/2016  . Back pain 10/25/2016  . Sleep difficulties 10/25/2016  . Allergic rhinitis 04/28/2016  . Dizzy 12/07/2015  . Type 2 diabetes mellitus without complication ([place]) 92/92/4462  . GERD (gastroesophageal reflux disease) 03/21/2013  . Headache 01/31/2013  . Gait disorder 01/31/2013  . Elevated BP 04/25/2012  . Narcotic abuse ([place]) 06/05/2011  . OBESITY 10/17/2010  . 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 SLEEP APNEA 01/24/2009  . BIPOLAR DISORDER UNSPECIFIED 05/05/2008  . HYPERLIPIDEMIA 03/30/2008  . ABUSE, ALCOHOL, UNSPECIFIED 04/26/2007  . TOBACCO ABUSE 12/25/2006     Objective:  BP 110/78   Pulse (!) 118   Temp 99 F (37.2 C)   SpO2 98%  Vitals and nursing note reviewed  General: Diaphoretic, pleasant Cardiac: Tachycardic, regular rhythm, normal heart sounds, no murmurs Respiratory: CTAB, normal effort Extremities: no edema or  cyanosis. WWP. Skin: warm, no rashes noted Neuro: alert and oriented, no focal deficits Psych: normal affect  Assessment & Plan:   Other chest pain Patient given aspirin 325 in office, EKG shows some possible ST changes.  Will send patient to emergency room via EMS.   Started on oxygen here in office and also given sublingual nitro 0.4 mg x 1 for active chest pain.  EMS called to transport patient to ED for further work-up, called triage nurse and signed out to EMS.   [place] Quintan Saldivar, DO Family Medicine Resident PGY-2

## 2018-11-23 ENCOUNTER — Inpatient Hospital Stay (HOSPITAL_COMMUNITY): Payer: Medicare Other | Admitting: Registered Nurse

## 2018-11-23 ENCOUNTER — Other Ambulatory Visit: Payer: Self-pay

## 2018-11-23 ENCOUNTER — Inpatient Hospital Stay (HOSPITAL_COMMUNITY): Payer: Medicare Other

## 2018-11-23 ENCOUNTER — Encounter (HOSPITAL_COMMUNITY)
Admission: EM | Disposition: A | Discharge status: Home Health | Payer: Self-pay | Source: Home / Self Care | Attending: Family Medicine

## 2018-11-23 DIAGNOSIS — R079 Chest pain, unspecified: Secondary | ICD-10-CM

## 2018-11-23 DIAGNOSIS — D72829 Elevated white blood cell count, unspecified: Secondary | ICD-10-CM

## 2018-11-23 DIAGNOSIS — R1084 Generalized abdominal pain: Secondary | ICD-10-CM

## 2018-11-23 HISTORY — PX: ILEOSTOMY: SHX1783

## 2018-11-23 HISTORY — PX: APPLICATION OF WOUND VAC: SHX5189

## 2018-11-23 HISTORY — PX: COLECTOMY: SHX59

## 2018-11-23 LAB — BASIC METABOLIC PANEL
Anion gap: 10 (ref 5–15)
BUN: 18 mg/dL (ref 6–20)
CO2: 20 mmol/L — ABNORMAL LOW (ref 22–32)
Calcium: 7.5 mg/dL — ABNORMAL LOW (ref 8.9–10.3)
Chloride: 103 mmol/L (ref 98–111)
Creatinine, Ser: 1.34 mg/dL — ABNORMAL HIGH (ref 0.61–1.24)
GFR calc Af Amer: >60 mL/min (ref >60)
GFR calc non Af Amer: >60 mL/min (ref >60)
Glucose, Bld: 210 mg/dL — ABNORMAL HIGH (ref 70–99)
Potassium: 3.8 mmol/L (ref 3.5–5.1)
SODIUM: 133 mmol/L — AB (ref 135–145)

## 2018-11-23 LAB — URINALYSIS, ROUTINE W REFLEX MICROSCOPIC
BACTERIA UA: NONE SEEN
Bilirubin Urine: NEGATIVE
Glucose, UA: NEGATIVE mg/dL
Ketones, ur: NEGATIVE mg/dL
Leukocytes, UA: NEGATIVE
Nitrite: NEGATIVE
Protein, ur: 30 mg/dL — AB
Specific Gravity, Urine: 1.024 (ref 1.005–1.030)
pH: 5 (ref 5.0–8.0)

## 2018-11-23 LAB — CBC
HCT: 38.6 % — ABNORMAL LOW (ref 39.0–52.0)
HCT: 39.1 % (ref 39.0–52.0)
Hemoglobin: 13 g/dL (ref 13.0–17.0)
Hemoglobin: 13.4 g/dL (ref 13.0–17.0)
MCH: 28.3 pg (ref 26.0–34.0)
MCH: 28.9 pg (ref 26.0–34.0)
MCHC: 33.7 g/dL (ref 30.0–36.0)
MCHC: 34.3 g/dL (ref 30.0–36.0)
MCV: 84.1 fL (ref 80.0–100.0)
MCV: 84.4 fL (ref 80.0–100.0)
Platelets: 193 10*3/uL (ref 150–400)
Platelets: 203 10*3/uL (ref 150–400)
RBC: 4.59 MIL/uL (ref 4.22–5.81)
RBC: 4.63 MIL/uL (ref 4.22–5.81)
RDW: 13.2 % (ref 11.5–15.5)
RDW: 13.5 % (ref 11.5–15.5)
WBC: 7.1 10*3/uL (ref 4.0–10.5)
WBC: 7.5 10*3/uL (ref 4.0–10.5)
nRBC: 0 % (ref 0.0–0.2)
nRBC: 0 % (ref 0.0–0.2)

## 2018-11-23 LAB — GLUCOSE, CAPILLARY: Glucose-Capillary: 198 mg/dL — ABNORMAL HIGH (ref 70–99)

## 2018-11-23 LAB — COMPREHENSIVE METABOLIC PANEL
ALT: 58 U/L — ABNORMAL HIGH (ref 0–44)
ANION GAP: 7 (ref 5–15)
AST: 72 U/L — ABNORMAL HIGH (ref 15–41)
Albumin: 3.3 g/dL — ABNORMAL LOW (ref 3.5–5.0)
Alkaline Phosphatase: 77 U/L (ref 38–126)
BUN: 11 mg/dL (ref 6–20)
CO2: 26 mmol/L (ref 22–32)
Calcium: 8.2 mg/dL — ABNORMAL LOW (ref 8.9–10.3)
Chloride: 102 mmol/L (ref 98–111)
Creatinine, Ser: 1.02 mg/dL (ref 0.61–1.24)
GFR calc Af Amer: >60 mL/min (ref >60)
GFR calc non Af Amer: >60 mL/min (ref >60)
GLUCOSE: 146 mg/dL — AB (ref 70–99)
Potassium: 2.9 mmol/L — ABNORMAL LOW (ref 3.5–5.1)
Sodium: 135 mmol/L (ref 135–145)
TOTAL PROTEIN: 6.7 g/dL (ref 6.5–8.1)
Total Bilirubin: 1 mg/dL (ref 0.3–1.2)

## 2018-11-23 LAB — TYPE AND SCREEN
ABO/RH(D): O NEG
Antibody Screen: NEGATIVE

## 2018-11-23 LAB — SURGICAL PCR SCREEN
MRSA, PCR: NEGATIVE
Staphylococcus aureus: NEGATIVE

## 2018-11-23 LAB — HEMOGLOBIN A1C
HEMOGLOBIN A1C: 6.4 % — AB (ref 4.8–5.6)
Mean Plasma Glucose: 136.98 mg/dL

## 2018-11-23 LAB — ABO/RH: ABO/RH(D): O NEG

## 2018-11-23 LAB — LACTIC ACID, PLASMA: Lactic Acid, Venous: 0.8 mmol/L (ref 0.5–1.9)

## 2018-11-23 LAB — MAGNESIUM: Magnesium: 1.5 mg/dL — ABNORMAL LOW (ref 1.7–2.4)

## 2018-11-23 SURGERY — COLECTOMY, TOTAL
Anesthesia: General | Site: Abdomen

## 2018-11-23 MED ORDER — PROPOFOL 10 MG/ML IV BOLUS
INTRAVENOUS | Status: DC | PRN
  Administered 2018-11-23: 170 mg via INTRAVENOUS

## 2018-11-23 MED ORDER — KETAMINE HCL 10 MG/ML IJ SOLN
INTRAMUSCULAR | Status: DC | PRN
  Administered 2018-11-23: 20 mg via INTRAVENOUS
  Administered 2018-11-23: 10 mg via INTRAVENOUS

## 2018-11-23 MED ORDER — LISDEXAMFETAMINE DIMESYLATE 20 MG PO CAPS
20.0000 mg | ORAL_CAPSULE | Freq: Every day | ORAL | Status: DC
  Administered 2018-11-24 – 2018-11-30 (×7): 20 mg via ORAL
  Filled 2018-11-23 (×8): qty 1

## 2018-11-23 MED ORDER — MAGNESIUM SULFATE 2 GM/50ML IV SOLN
2.0000 g | Freq: Once | INTRAVENOUS | Status: AC
  Administered 2018-11-23: 2 g via INTRAVENOUS
  Filled 2018-11-23: qty 50

## 2018-11-23 MED ORDER — PANTOPRAZOLE SODIUM 40 MG IV SOLR
40.0000 mg | Freq: Every day | INTRAVENOUS | Status: DC
  Administered 2018-11-23 – 2018-11-25 (×3): 40 mg via INTRAVENOUS
  Filled 2018-11-23 (×3): qty 40

## 2018-11-23 MED ORDER — LIDOCAINE 2% (20 MG/ML) 5 ML SYRINGE
INTRAMUSCULAR | Status: AC
  Filled 2018-11-23: qty 5

## 2018-11-23 MED ORDER — 0.9 % SODIUM CHLORIDE (POUR BTL) OPTIME
TOPICAL | Status: DC | PRN
  Administered 2018-11-23 (×4): 1000 mL

## 2018-11-23 MED ORDER — POTASSIUM CHLORIDE 10 MEQ/100ML IV SOLN
10.0000 meq | INTRAVENOUS | Status: AC
  Administered 2018-11-23 (×2): 10 meq via INTRAVENOUS

## 2018-11-23 MED ORDER — LACTATED RINGERS IV SOLN
INTRAVENOUS | Status: DC | PRN
  Administered 2018-11-23: 09:00:00 via INTRAVENOUS

## 2018-11-23 MED ORDER — TRAZODONE HCL 50 MG PO TABS
50.0000 mg | ORAL_TABLET | Freq: Every day | ORAL | Status: DC
  Administered 2018-11-23 – 2018-11-29 (×6): 50 mg via ORAL
  Filled 2018-11-23 (×6): qty 1

## 2018-11-23 MED ORDER — ATORVASTATIN CALCIUM 40 MG PO TABS
40.0000 mg | ORAL_TABLET | Freq: Every day | ORAL | Status: DC
  Administered 2018-11-23: 40 mg via ORAL
  Filled 2018-11-23: qty 1

## 2018-11-23 MED ORDER — KETOROLAC TROMETHAMINE 30 MG/ML IJ SOLN
30.0000 mg | Freq: Four times a day (QID) | INTRAMUSCULAR | Status: DC | PRN
  Administered 2018-11-23 (×2): 30 mg via INTRAVENOUS
  Filled 2018-11-23 (×2): qty 1

## 2018-11-23 MED ORDER — PROPOFOL 1000 MG/100ML IV EMUL
INTRAVENOUS | Status: AC
  Filled 2018-11-23: qty 300

## 2018-11-23 MED ORDER — MORPHINE SULFATE (PF) 2 MG/ML IV SOLN
2.0000 mg | Freq: Once | INTRAVENOUS | Status: AC
  Administered 2018-11-23: 2 mg via INTRAVENOUS
  Filled 2018-11-23: qty 1

## 2018-11-23 MED ORDER — CIPROFLOXACIN IN D5W 400 MG/200ML IV SOLN
400.0000 mg | Freq: Once | INTRAVENOUS | Status: AC
  Filled 2018-11-23: qty 200

## 2018-11-23 MED ORDER — SODIUM CHLORIDE 0.9 % IV SOLN
2.0000 g | INTRAVENOUS | Status: DC
  Administered 2018-11-23: 2 g via INTRAVENOUS
  Filled 2018-11-23 (×2): qty 20

## 2018-11-23 MED ORDER — HEPARIN SODIUM (PORCINE) 5000 UNIT/ML IJ SOLN
5000.0000 [IU] | Freq: Three times a day (TID) | INTRAMUSCULAR | Status: DC
  Administered 2018-11-24 – 2018-11-30 (×15): 5000 [IU] via SUBCUTANEOUS
  Filled 2018-11-23 (×18): qty 1

## 2018-11-23 MED ORDER — LIDOCAINE 2% (20 MG/ML) 5 ML SYRINGE
INTRAMUSCULAR | Status: DC | PRN
  Administered 2018-11-23: 100 mg via INTRAVENOUS

## 2018-11-23 MED ORDER — ESMOLOL HCL 100 MG/10ML IV SOLN
INTRAVENOUS | Status: AC
  Filled 2018-11-23: qty 10

## 2018-11-23 MED ORDER — SODIUM CHLORIDE 0.9 % IV BOLUS
500.0000 mL | Freq: Once | INTRAVENOUS | Status: AC
  Administered 2018-11-23: 500 mL via INTRAVENOUS

## 2018-11-23 MED ORDER — SUGAMMADEX SODIUM 500 MG/5ML IV SOLN
INTRAVENOUS | Status: DC | PRN
  Administered 2018-11-23: 100 mg via INTRAVENOUS
  Administered 2018-11-23: 125 mg via INTRAVENOUS

## 2018-11-23 MED ORDER — SUGAMMADEX SODIUM 500 MG/5ML IV SOLN
INTRAVENOUS | Status: AC
  Filled 2018-11-23: qty 5

## 2018-11-23 MED ORDER — CIPROFLOXACIN IN D5W 400 MG/200ML IV SOLN
INTRAVENOUS | Status: DC | PRN
  Administered 2018-11-23: 400 mg via INTRAVENOUS

## 2018-11-23 MED ORDER — MORPHINE SULFATE (PF) 2 MG/ML IV SOLN
2.0000 mg | INTRAVENOUS | Status: DC | PRN
  Administered 2018-11-23 – 2018-11-25 (×7): 2 mg via INTRAVENOUS
  Filled 2018-11-23 (×7): qty 1

## 2018-11-23 MED ORDER — SODIUM CHLORIDE 0.9 % IV SOLN
INTRAVENOUS | Status: AC
  Administered 2018-11-23: 01:00:00 via INTRAVENOUS

## 2018-11-23 MED ORDER — ACETAMINOPHEN 325 MG PO TABS
650.0000 mg | ORAL_TABLET | Freq: Four times a day (QID) | ORAL | Status: DC
  Administered 2018-11-23 – 2018-11-30 (×28): 650 mg via ORAL
  Filled 2018-11-23 (×28): qty 2

## 2018-11-23 MED ORDER — ROCURONIUM BROMIDE 50 MG/5ML IV SOSY
PREFILLED_SYRINGE | INTRAVENOUS | Status: DC | PRN
  Administered 2018-11-23: 100 mg via INTRAVENOUS

## 2018-11-23 MED ORDER — VILAZODONE HCL 40 MG PO TABS
40.0000 mg | ORAL_TABLET | Freq: Every day | ORAL | Status: DC
  Administered 2018-11-24 – 2018-11-30 (×7): 40 mg via ORAL
  Filled 2018-11-23 (×8): qty 1

## 2018-11-23 MED ORDER — LACTATED RINGERS IV SOLN
INTRAVENOUS | Status: DC
  Administered 2018-11-23: 09:00:00 via INTRAVENOUS

## 2018-11-23 MED ORDER — MIDAZOLAM HCL 5 MG/5ML IJ SOLN
INTRAMUSCULAR | Status: DC | PRN
  Administered 2018-11-23: 4 mg via INTRAVENOUS

## 2018-11-23 MED ORDER — FENTANYL CITRATE (PF) 250 MCG/5ML IJ SOLN
INTRAMUSCULAR | Status: AC
  Filled 2018-11-23: qty 5

## 2018-11-23 MED ORDER — KETAMINE HCL 100 MG/ML IJ SOLN
INTRAMUSCULAR | Status: AC
  Filled 2018-11-23: qty 1

## 2018-11-23 MED ORDER — PROPOFOL 500 MG/50ML IV EMUL
INTRAVENOUS | Status: DC | PRN
  Administered 2018-11-23: 125 ug/kg/min via INTRAVENOUS
  Administered 2018-11-23: 150 ug/kg/min via INTRAVENOUS

## 2018-11-23 MED ORDER — LURASIDONE HCL 40 MG PO TABS
80.0000 mg | ORAL_TABLET | Freq: Every day | ORAL | Status: DC
  Administered 2018-11-23 – 2018-11-29 (×8): 80 mg via ORAL
  Filled 2018-11-23 (×10): qty 2

## 2018-11-23 MED ORDER — ONDANSETRON HCL 4 MG/2ML IJ SOLN
INTRAMUSCULAR | Status: DC | PRN
  Administered 2018-11-23: 4 mg via INTRAVENOUS

## 2018-11-23 MED ORDER — POTASSIUM CHLORIDE 10 MEQ/100ML IV SOLN
10.0000 meq | INTRAVENOUS | Status: AC
  Administered 2018-11-23 (×2): 10 meq via INTRAVENOUS
  Filled 2018-11-23 (×4): qty 100

## 2018-11-23 MED ORDER — FENTANYL CITRATE (PF) 100 MCG/2ML IJ SOLN
INTRAMUSCULAR | Status: DC | PRN
  Administered 2018-11-23 (×2): 50 ug via INTRAVENOUS
  Administered 2018-11-23: 150 ug via INTRAVENOUS

## 2018-11-23 MED ORDER — DEXAMETHASONE SODIUM PHOSPHATE 10 MG/ML IJ SOLN
INTRAMUSCULAR | Status: DC | PRN
  Administered 2018-11-23: 4 mg via INTRAVENOUS

## 2018-11-23 MED ORDER — SODIUM CHLORIDE 0.9 % IV SOLN
INTRAVENOUS | Status: DC | PRN
  Administered 2018-11-23: 50 ug/min via INTRAVENOUS
  Administered 2018-11-23: 30 ug/min via INTRAVENOUS

## 2018-11-23 MED ORDER — ONDANSETRON HCL 4 MG/2ML IJ SOLN
INTRAMUSCULAR | Status: AC
  Filled 2018-11-23: qty 2

## 2018-11-23 MED ORDER — PHENYLEPHRINE 40 MCG/ML (10ML) SYRINGE FOR IV PUSH (FOR BLOOD PRESSURE SUPPORT)
PREFILLED_SYRINGE | INTRAVENOUS | Status: AC
  Filled 2018-11-23: qty 10

## 2018-11-23 MED ORDER — GABAPENTIN 300 MG PO CAPS
300.0000 mg | ORAL_CAPSULE | Freq: Every day | ORAL | Status: DC
  Administered 2018-11-24 – 2018-11-30 (×7): 300 mg via ORAL
  Filled 2018-11-23 (×7): qty 1

## 2018-11-23 MED ORDER — ALBUTEROL SULFATE HFA 108 (90 BASE) MCG/ACT IN AERS
INHALATION_SPRAY | RESPIRATORY_TRACT | Status: AC
  Filled 2018-11-23: qty 6.7

## 2018-11-23 MED ORDER — ROCURONIUM BROMIDE 50 MG/5ML IV SOSY
PREFILLED_SYRINGE | INTRAVENOUS | Status: AC
  Filled 2018-11-23: qty 10

## 2018-11-23 MED ORDER — OXYCODONE HCL 5 MG PO TABS
5.0000 mg | ORAL_TABLET | ORAL | Status: DC | PRN
  Administered 2018-11-23 – 2018-11-24 (×3): 10 mg via ORAL
  Filled 2018-11-23 (×3): qty 2

## 2018-11-23 MED ORDER — ALBUTEROL SULFATE HFA 108 (90 BASE) MCG/ACT IN AERS
INHALATION_SPRAY | RESPIRATORY_TRACT | Status: DC | PRN
  Administered 2018-11-23 (×2): 6 via RESPIRATORY_TRACT

## 2018-11-23 MED ORDER — SODIUM CHLORIDE 0.9 % IV BOLUS
1000.0000 mL | Freq: Once | INTRAVENOUS | Status: AC
  Administered 2018-11-23: 1000 mL via INTRAVENOUS

## 2018-11-23 MED ORDER — ESMOLOL HCL 100 MG/10ML IV SOLN
INTRAVENOUS | Status: DC | PRN
  Administered 2018-11-23: 50 mg via INTRAVENOUS

## 2018-11-23 MED ORDER — HEPARIN SODIUM (PORCINE) 5000 UNIT/ML IJ SOLN
5000.0000 [IU] | Freq: Three times a day (TID) | INTRAMUSCULAR | Status: DC
  Administered 2018-11-23: 5000 [IU] via SUBCUTANEOUS
  Filled 2018-11-23: qty 1

## 2018-11-23 MED ORDER — ACETAMINOPHEN 650 MG RE SUPP: 650.0000 mg | Freq: Four times a day (QID) | RECTAL | Status: DC | PRN

## 2018-11-23 MED ORDER — LISDEXAMFETAMINE DIMESYLATE 30 MG PO CAPS
50.0000 mg | ORAL_CAPSULE | Freq: Every day | ORAL | Status: DC
  Filled 2018-11-23: qty 1

## 2018-11-23 MED ORDER — PHENYLEPHRINE 40 MCG/ML (10ML) SYRINGE FOR IV PUSH (FOR BLOOD PRESSURE SUPPORT)
PREFILLED_SYRINGE | INTRAVENOUS | Status: DC | PRN
  Administered 2018-11-23 (×2): 120 ug via INTRAVENOUS

## 2018-11-23 MED ORDER — ALBUMIN HUMAN 5 % IV SOLN
INTRAVENOUS | Status: DC | PRN
  Administered 2018-11-23 (×2): via INTRAVENOUS

## 2018-11-23 MED ORDER — SODIUM CHLORIDE 0.9 % IV SOLN
INTRAVENOUS | Status: DC
  Administered 2018-11-23: 16:00:00 via INTRAVENOUS

## 2018-11-23 MED ORDER — ACETAMINOPHEN 325 MG PO TABS: 650.0000 mg | ORAL_TABLET | Freq: Four times a day (QID) | ORAL | Status: DC | PRN

## 2018-11-23 MED ORDER — MIDAZOLAM HCL 2 MG/2ML IJ SOLN
INTRAMUSCULAR | Status: AC
  Filled 2018-11-23: qty 4

## 2018-11-23 MED ORDER — DEXAMETHASONE SODIUM PHOSPHATE 10 MG/ML IJ SOLN
INTRAMUSCULAR | Status: AC
  Filled 2018-11-23: qty 1

## 2018-11-23 SURGICAL SUPPLY — 71 items
BIOPATCH RED 1 DISK 7.0 (GAUZE/BANDAGES/DRESSINGS) ×1 IMPLANT
BIOPATCH RED 1IN DISK 7.0MM (GAUZE/BANDAGES/DRESSINGS) ×1
BLADE CLIPPER SURG (BLADE) IMPLANT
CANISTER SUCT 3000ML PPV (MISCELLANEOUS) ×3 IMPLANT
CANISTER WOUNDNEG PRESSURE 500 (CANNISTER) ×2 IMPLANT
CHLORAPREP W/TINT 26ML (MISCELLANEOUS) ×3 IMPLANT
COVER MAYO STAND STRL (DRAPES) ×6 IMPLANT
COVER SURGICAL LIGHT HANDLE (MISCELLANEOUS) ×6 IMPLANT
COVER WAND RF STERILE (DRAPES) ×3 IMPLANT
DRAIN CHANNEL 19F RND (DRAIN) ×2 IMPLANT
DRAPE HALF SHEET 40X57 (DRAPES) ×3 IMPLANT
DRAPE LAPAROSCOPIC ABDOMINAL (DRAPES) ×3 IMPLANT
DRAPE UTILITY XL STRL (DRAPES) ×3 IMPLANT
DRAPE WARM FLUID 44X44 (DRAPE) ×3 IMPLANT
DRSG OPSITE POSTOP 4X10 (GAUZE/BANDAGES/DRESSINGS) IMPLANT
DRSG OPSITE POSTOP 4X8 (GAUZE/BANDAGES/DRESSINGS) IMPLANT
DRSG TEGADERM 4X4.75 (GAUZE/BANDAGES/DRESSINGS) ×2 IMPLANT
DRSG VAC ATS LRG SENSATRAC (GAUZE/BANDAGES/DRESSINGS) ×2 IMPLANT
ELECT BLADE 6.5 EXT (BLADE) ×3 IMPLANT
ELECT CAUTERY BLADE 6.4 (BLADE) ×6 IMPLANT
ELECT REM PT RETURN 9FT ADLT (ELECTROSURGICAL) ×3
ELECTRODE REM PT RTRN 9FT ADLT (ELECTROSURGICAL) ×1 IMPLANT
EVACUATOR SILICONE 100CC (DRAIN) ×2 IMPLANT
GLOVE BIOGEL PI IND STRL 7.5 (GLOVE) ×2 IMPLANT
GLOVE BIOGEL PI INDICATOR 7.5 (GLOVE) ×4
GLOVE SURG SS PI 7.0 STRL IVOR (GLOVE) ×6 IMPLANT
GOWN STRL REUS W/ TWL LRG LVL3 (GOWN DISPOSABLE) ×6 IMPLANT
GOWN STRL REUS W/TWL LRG LVL3 (GOWN DISPOSABLE) ×9
KIT BASIN OR (CUSTOM PROCEDURE TRAY) ×3 IMPLANT
KIT OSTOMY DRAINABLE 2.75 STR (WOUND CARE) ×4 IMPLANT
KIT TURNOVER KIT B (KITS) ×3 IMPLANT
LEGGING LITHOTOMY PAIR STRL (DRAPES) ×3 IMPLANT
LIGASURE IMPACT 36 18CM CVD LR (INSTRUMENTS) ×2 IMPLANT
NEEDLE 22X1 1/2 (OR ONLY) (NEEDLE) ×3 IMPLANT
NS IRRIG 1000ML POUR BTL (IV SOLUTION) ×6 IMPLANT
PACK COLON (CUSTOM PROCEDURE TRAY) ×3 IMPLANT
PACK GENERAL/GYN (CUSTOM PROCEDURE TRAY) ×3 IMPLANT
PAD ARMBOARD 7.5X6 YLW CONV (MISCELLANEOUS) ×3 IMPLANT
PENCIL BUTTON HOLSTER BLD 10FT (ELECTRODE) ×3 IMPLANT
PENCIL SMOKE EVACUATOR (MISCELLANEOUS) ×3 IMPLANT
RELOAD PROXIMATE 100 BLUE (MISCELLANEOUS) ×4 IMPLANT
RELOAD PROXIMATE 100MM BLUE (MISCELLANEOUS) ×12
RELOAD STAPLE 100 3.8 BLU REG (MISCELLANEOUS) IMPLANT
SPECIMEN JAR LARGE (MISCELLANEOUS) ×3 IMPLANT
SPONGE LAP 18X18 X RAY DECT (DISPOSABLE) IMPLANT
STAPLER CUT CVD 40MM BLUE (STAPLE) ×2 IMPLANT
STAPLER VISISTAT 35W (STAPLE) ×3 IMPLANT
SUCTION POOLE TIP (SUCTIONS) ×3 IMPLANT
SURGILUBE 2OZ TUBE FLIPTOP (MISCELLANEOUS) ×3 IMPLANT
SUT ETHILON 2 0 FS 18 (SUTURE) ×2 IMPLANT
SUT PDS AB 0 CT 36 (SUTURE) ×6 IMPLANT
SUT PDS AB 1 TP1 96 (SUTURE) ×6 IMPLANT
SUT PROLENE 0 CT 1 30 (SUTURE) ×3 IMPLANT
SUT PROLENE 2 0 CT2 30 (SUTURE) ×4 IMPLANT
SUT PROLENE 2 0 KS (SUTURE) IMPLANT
SUT SILK 2 0 SH CR/8 (SUTURE) ×3 IMPLANT
SUT SILK 2 0 TIES 10X30 (SUTURE) ×3 IMPLANT
SUT SILK 3 0 SH CR/8 (SUTURE) ×3 IMPLANT
SUT SILK 3 0 TIES 10X30 (SUTURE) ×3 IMPLANT
SUT SILK 3 0SH CR/8 30 (SUTURE) ×2 IMPLANT
SUT VIC AB 0 CT1 27 (SUTURE) ×3
SUT VIC AB 0 CT1 27XBRD ANBCTR (SUTURE) ×1 IMPLANT
SUT VIC AB 3-0 SH 18 (SUTURE) ×2 IMPLANT
SYR BULB IRRIGATION 50ML (SYRINGE) ×3 IMPLANT
TOWEL OR 17X26 10 PK STRL BLUE (TOWEL DISPOSABLE) ×6 IMPLANT
TRAY FOLEY MTR SLVR 16FR STAT (SET/KITS/TRAYS/PACK) ×3 IMPLANT
TRAY PROCTOSCOPIC FIBER OPTIC (SET/KITS/TRAYS/PACK) ×3 IMPLANT
TUBE CONNECTING 12'X1/4 (SUCTIONS) ×2
TUBE CONNECTING 12X1/4 (SUCTIONS) ×4 IMPLANT
UNDERPAD 30X30 (UNDERPADS AND DIAPERS) ×3 IMPLANT
YANKAUER SUCT BULB TIP NO VENT (SUCTIONS) ×3 IMPLANT

## 2018-11-23 NOTE — Progress Notes (Signed)
  Progress Note: General Surgery Service   Assessment/Plan: Active Problems:   Abdominal pain  s/p Procedure(s): 38 yo male with 1 week of abdominal pain, initially thought to be diverticulitis, CT concerning for stricture/mass in sigmoid with pneumatosis of the right colon and dilation to 8cm. Patient developed worse pain and had findings of free air on CXR. -OR for ex lap and colectomy with ostomy creation -IV abx -discussed procedure with patient and details of removal of the perforated colon, attempt at removal of stricture/mass, creation of ostomy with plan for reversal in 6 months. We discussed risk of bleeding, leak, abscess, hernia, wound infection, injury to intestine, injury to ureter, pneumonia and heart stress. He showed good understanding and wanted to proceed    LOS: 1 day  Chief Complaint/Subjective: Worsening abdominal pain  Objective: Vital signs in last 24 hours: Temp:  [98.6 F (37 C)-100.4 F (38 C)] 99.5 F (37.5 C) (02/04 0815) Pulse Rate:  [100-138] 129 (02/04 0815) Resp:  [16-26] 20 (02/04 0815) BP: (106-148)/(56-91) 118/70 (02/04 0815) SpO2:  [92 %-99 %] 94 % (02/04 0815) Weight:  [112.5 kg] 112.5 kg (02/03 1519) Last BM Date: 11/22/18  Intake/Output from previous day: 02/03 0701 - 02/04 0700 In: 100 [IV Piggyback:100] Out: -  Intake/Output this shift: No intake/output data recorded.  Lungs: nonlabored breathing  Cardiovascular: tachycardic  Abd: guarding, tender to palpation  Extremities: no edema  Neuro: AOx4  Lab Results: CBC  Recent Labs    11/22/18 1549 11/23/18 0208  WBC 9.8 7.1  HGB 14.8 13.0  HCT 42.2 38.6*  PLT 239 193   BMET Recent Labs    11/22/18 1700 11/23/18 0208  NA 136 135  K 2.9* 2.9*  CL 97* 102  CO2 23 26  GLUCOSE 104* 146*  BUN 12 11  CREATININE 1.15 1.02  CALCIUM 8.9 8.2*   PT/INR No results for input(s): LABPROT, INR in the last 72 hours. ABG No results for input(s): PHART, HCO3 in the last 72  hours.  Invalid input(s): PCO2, PO2  Studies/Results:  Anti-infectives: Anti-infectives (From admission, onward)   Start     Dose/Rate Route Frequency Ordered Stop   11/23/18 0600  aztreonam (AZACTAM) 2 g in sodium chloride 0.9 % 100 mL IVPB     2 g 200 mL/hr over 30 Minutes Intravenous Every 8 hours 11/22/18 2046     11/22/18 2030  aztreonam (AZACTAM) 2 g in sodium chloride 0.9 % 100 mL IVPB     2 g 200 mL/hr over 30 Minutes Intravenous  Once 11/22/18 2027 11/22/18 2131   11/22/18 2030  metroNIDAZOLE (FLAGYL) IVPB 500 mg     500 mg 100 mL/hr over 60 Minutes Intravenous Every 8 hours 11/22/18 2027        Medications: Scheduled Meds: . gabapentin  300 mg Oral Daily  . heparin  5,000 Units Subcutaneous Q8H  . lisdexamfetamine  20 mg Oral Q lunch  . lurasidone  80 mg Oral QHS  . traZODone  50 mg Oral QHS  . Vilazodone HCl  40 mg Oral Daily   Continuous Infusions: . sodium chloride 125 mL/hr at 11/23/18 0107  . aztreonam 2 g (11/23/18 0743)  . lisdexamfetamine (VYVANSE) capsule 50 mg    . metronidazole 500 mg (11/23/18 0424)  . potassium chloride     PRN Meds:.acetaminophen **OR** acetaminophen, ketorolac, ondansetron **OR** ondansetron (ZOFRAN) IV  Mickeal Skinner, MD [facility]

## 2018-11-23 NOTE — Progress Notes (Signed)
0050 received patient from ED, A&O x4. Patient ambulated to bed, complains of pain 10/10 in back. Wife at bedside. Administered prn pain meds, will continue to monitor.

## 2018-11-23 NOTE — Progress Notes (Signed)
Dr. Tobias Alexander notified of potassium of 2.9

## 2018-11-23 NOTE — Anesthesia Preprocedure Evaluation (Addendum)
Anesthesia Evaluation  Patient identified by MRN, date of birth, ID band Patient awake    Reviewed: Allergy & Precautions, NPO status , Patient's Chart, lab work & pertinent test results  History of Anesthesia Complications Negative for: history of anesthetic complications  Airway Mallampati: II  TM Distance: >3 FB Neck ROM: Full    Dental  (+) Missing, Poor Dentition, Dental Advisory Given   Pulmonary sleep apnea , Current Smoker,    Pulmonary exam normal        Cardiovascular negative cardio ROS   Rhythm:Regular Rate:Tachycardia     Neuro/Psych PSYCHIATRIC DISORDERS Anxiety Depression Bipolar Disorder No physical effects reported  Neuromuscular disease    GI/Hepatic Neg liver ROS, GERD  ,  Endo/Other  diabetesMorbid obesity  Renal/GU negative Renal ROS  negative genitourinary   Musculoskeletal negative musculoskeletal ROS (+)   Abdominal   Peds negative pediatric ROS (+)  Hematology negative hematology ROS (+)   Anesthesia Other Findings   Reproductive/Obstetrics negative OB ROS                            Anesthesia Physical Anesthesia Plan  ASA: III and emergent  Anesthesia Plan: General   Post-op Pain Management:    Induction: Intravenous and Cricoid pressure planned  PONV Risk Score and Plan: 2 and Ondansetron and Dexamethasone  Airway Management Planned: Oral ETT  Additional Equipment:   Intra-op Plan:   Post-operative Plan: Post-operative intubation/ventilation  Informed Consent: I have reviewed the patients History and Physical, chart, labs and discussed the procedure including the risks, benefits and alternatives for the proposed anesthesia with the patient or authorized representative who has indicated his/her understanding and acceptance.     Dental advisory given  Plan Discussed with: CRNA and Anesthesiologist  Anesthesia Plan Comments:          Anesthesia Quick Evaluation

## 2018-11-23 NOTE — Progress Notes (Signed)
Family Medicine Teaching Service Daily Progress Note Intern Pager: [phone]  Patient name: [name] Medical record number: [id number] Date of birth: 02/24/1981 Age: 38 y.o. 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 male presenting with abdominal pain concerning for bowel obstruction. PMH is significant for Bipolar disorder, HLD, substance abuse, beckers muscular dystrophy  Large bowel obstruction w/ possible colonic mass  CT concerning for fistula to bladder vs colon cancer with proximal colonic obstruction. No family history of colon cancer. Recent antibiotics for diverticulitis without improvement. Worsening abdominal pain overnight with tachycardia to 150's s/p Morphine x 2 ('2mg'$  and '4mg'$ ). Complete abdominal study positive for perforation with large volume pneumoperitoneum, colonic distention. CBC/CMP WNL without leukocytosis. Other than sustained tachycardia, has remained hemodynamically stable and afebrile. FOBT pending. CEA per surgery recs pending.  - Per surgery, ex lab and colectomy with ostomy - s/p Ciprofloxacin for surgical prophylaxis - Per Pharm, transition from Aztreonam to CTX (2/4-), continue Flagyl (2/3-) - GI consulted, will follow up  - s/p Morphine x 2 for pain, Toradol '30mg'$  IV q6hrs PRN - zofran PRN - k-pad - continuous cardiac monitoring, continuous pulse ox - follow up AM CBC, CMP, CEA, UA, urine cx - NS mIVF  122m/hr - Tylenol q6 PRN, Morphine q2 PRN mod-severe, Oxycodone 5-'10mg'$  q4 PRN moderate pain, kpad - Zorfran '4mg'$  q6 PRN  Elevated Transaminases: AST: 43>72, ALT 35>58, alk phos WNL. Expect irritation from acute bowel.  - Follow up AM CMP - Consider further work up if worsens  Chest Pain: resolved Initially treated with nitroglycerin without resolution. Troponin, CT chest, EKG, CXR negative. Self Resolved. - continue to monitor  Dysuria: Noted dysuria and dark colored urine. Concerning for fistula formation to bladder given CT results. UA and urine culture pending.  Hypokalemia/Hypomagnesia: K 2.9 on admission. S/p 427m IV K+ without much improvement. Mag 1.5.  - Give 2g Mag and another 40 mEq IV K+   - follow up AM CMP  Pre-diabetes:  A1C 6.4 (up from 6.3). H/o of allergy to metformin. - sSSI - monitor CBG's  HLD:  Home meds: Lipitor '40mg'$ . Last lipid panel: LDL 169 - hold statin given increasing liver enzymes   Bipolar disorder - Home meds: Latuda '80mg'$  qHS, Viibryd '40mg'$  QD. Lamtorigine '50mg'$  QD on med list but not taking. - continue latuda - continue viibryd  ADHD - patient takes vyvanse '50mg'$  w/ breakfast, '20mg'$  at lunch.  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 Home med: gabapentin '300mg'$  QD - continue gabapentin  GERD - patient takes '40mg'$  prilosec at home - IV Protonix, switch to PO when no longer NPO  Insomnia - Home meds: Trazodone '50mg'$  qHS - continue home meds   FEN/GI: NPO, replace electrolytes PRN, Protonix Prophylaxis: heparin subQ.   Disposition: pending improvement s/p surgery  Subjective:  Patient very sleepy during exam, s/p abdominal surgery. Notes pain of 10/10 on his right lower abdomen, otherwise no other concerns.   Objective: Temp:  [  98.5 F (36.9 C)-100.4 F (38 C)] 98.5 F (36.9 C) (02/04 1300) Pulse Rate:  [104-135] 119 (02/04 1300) Resp:  [16-29] 22 (02/04 1241) BP: (96-148)/(56-91) 96/73  (02/04 1300) SpO2:  [92 %-100 %] 97 % (02/04 1241) Physical Exam: General: large, caucasian male, well nourished, well developed, in no acute distress with non-toxic appearance, lying in bed sleeping  CV: regular rate and rhythm without murmurs, rubs, or gallops, no lower extremity edema, 2+ radial and pedal pulses bilaterally Lungs: clear to auscultation bilaterally with normal work of breathing Abdomen: soft, tender, large midline incision with vacuum seal in place, double ostomy bag along right mid abdomen with visible brown stool  Skin: warm, dry Extremities: warm and well perfused, normal tone  Laboratory: Recent Labs  Lab 11/22/18 1549 11/23/18 0208 11/23/18 1425  WBC 9.8 7.1 7.5  HGB 14.8 13.0 13.4  HCT 42.2 38.6* 39.1  PLT 239 193 203   Recent Labs  Lab 11/22/18 1634 11/22/18 1700 11/23/18 0208 11/23/18 1425  NA  --  136 135 133*  K  --  2.9* 2.9* 3.8  CL  --  97* 102 103  CO2  --  23 26 20*  BUN  --  _0 CREATININE  --  1.15 1.02 1.34*  CALCIUM  --  8.9 8.2* 7.5*  PROT 7.6  --  6.7  --   BILITOT 0.9  --  1.0  --   ALKPHOS 67  --  77  --   ALT 35  --  58*  --   AST 43*  --  72*  --   GLUCOSE  --  104* 146* 210*   Hepatic Function Latest Ref Rng & Units 11/23/2018 11/22/2018 10/04/2018  Total Protein 6.5 - 8.1 g/dL 6.7 7.6 7.4  Albumin 3.5 - 5.0 g/dL 3.3(L) 3.9 4.5  AST 15 - 41 U/L 72(H) 43(H) 23  ALT 0 - 44 U/L 58(H) 35 29  Alk Phosphatase 38 - 126 U/L 77 67 88  Total Bilirubin 0.3 - 1.2 mg/dL 1.0 0.9 <0.2  Bilirubin, Direct 0.0 - 0.2 mg/dL - 0.3(H) -   Lipase: 21 (nl) Troponin: <0.03 D-Dimer: 5.74 Lactic acid: 1.0>0.8 Mag: 1.5 A1C: 6.4  Imaging/Diagnostic Tests: Dg Chest 2 View  Result Date: 11/22/2018 CLINICAL DATA:  Severe chest pain. EXAM: CHEST - 2 VIEW COMPARISON:  Chest x-ray dated 04/26/2007 and abdominal radiographs dated 11/19/2018 FINDINGS: The heart size and pulmonary vascularity are normal. There is slight atelectasis at the left lung  base. No consolidative infiltrates or effusions. No bone abnormality. Slight distention of the colon. IMPRESSION: Slight atelectasis at the left lung base which may be due to a shallow inspiration. Increased gaseous distention of the colon since 11/19/2018. Electronically Signed   By: Lorriane Shire M.D.   On: 11/22/2018 16:39   Ct Angio Chest Pe W And/or Wo Contrast  Result Date: 11/22/2018 CLINICAL DATA:  Type midsternal and left-sided chest pain beginning today at 11 a.m. Back pain and spasms. Generalized abdominal pain. EXAM: CT ANGIOGRAPHY CHEST CT ABDOMEN AND PELVIS WITH CONTRAST TECHNIQUE: Multidetector CT imaging of the chest was performed using the standard protocol during bolus administration of intravenous contrast. Multiplanar CT image reconstructions and MIPs were obtained to evaluate the vascular anatomy. Multidetector CT imaging of the abdomen and pelvis was performed using the standard protocol during bolus administration of intravenous contrast. CONTRAST:  174m ISOVUE-370 IOPAMIDOL (ISOVUE-370) INJECTION 76% COMPARISON:  CT abdomen and pelvis 11/19/2018 FINDINGS: CTA CHEST FINDINGS  Cardiovascular: There is good opacification of the central and segmental pulmonary arteries. No focal filling defects are demonstrated. No evidence of significant pulmonary embolus. Normal heart size. No pericardial effusions. Normal caliber thoracic aorta. No dissection. Great vessel origins are patent. Mediastinum/Nodes: Mediastinal lymph nodes are not pathologically enlarged. Esophagus is decompressed. Lungs/Pleura: Motion artifact limits evaluation. There is some linear atelectasis in the left mid lung. Mosaic attenuation pattern to the lungs is probably due to motion artifact but could also indicate air trapping or edema. No pleural effusions. No pneumothorax. Airways are patent. Musculoskeletal: No chest wall abnormality. No acute or significant osseous findings. Review of the MIP images confirms the above  findings. CT ABDOMEN and PELVIS FINDINGS Hepatobiliary: No focal liver abnormality is seen. No gallstones, gallbladder wall thickening, or biliary dilatation. Pancreas: Unremarkable. No pancreatic ductal dilatation or surrounding inflammatory changes. Spleen: Normal in size without focal abnormality. Adrenals/Urinary Tract: Adrenal glands are unremarkable. Kidneys are normal, without renal calculi, focal lesion, or hydronephrosis. Focal bladder wall thickening superiorly, see below. Stomach/Bowel: Stomach and small bowel are decompressed. Segmental wall thickening of the sigmoid colon with abrupt shouldered margins and stranding in the adjacent fat. Adjacent to the colon, there is an enhancing area of infiltration contiguous with the bladder wall along with bladder wall thickening. This could represent diverticulitis with fistula formation although the abrupt margins of the lesion are suspicious for colon cancer in which case this would be locally invasive. There is evidence of proximal colonic obstruction with interval development of moderate colonic distention with gas and air-fluid levels. Peripheral gas along the nondependent surfaces in the right colon indicates pneumatosis. No portal venous gas is identified although there is mesenteric infiltration and edema. There is also edema in the pericolic gutters and pelvis. Vascular/Lymphatic: Scattered aortic calcifications. No aneurysm. No significant lymphadenopathy. Reproductive: Prostate is unremarkable. Other: Small amount of free fluid in the abdomen and pelvis, progressing since previous study. No free air. Abdominal wall musculature appears intact. Musculoskeletal: Mild degenerative changes in the spine. No destructive bone lesions. Review of the MIP images confirms the above findings. IMPRESSION: 1. No evidence of significant pulmonary embolus. 2. Linear atelectasis in the left lung. 3. Mosaic attenuation pattern to the lungs is probably due to motion  artifact but could indicate air trapping or edema. 4. Segmental wall thickening of the sigmoid colon with abrupt shouldered margins and stranding in the adjacent fat. Adjacent to the colon, there is an enhancing area of infiltration contiguous with the bladder wall along with bladder wall thickening. This could represent diverticulitis with fistula formation although the abrupt margins of the lesion are suspicious for colon cancer in which case this would be locally invasive. Surgical consultation is recommended. 5. Interval development of proximal colonic obstruction with gas and fluid levels. 6. Interval development of right colonic pneumatosis. This may be related to obstruction or could indicate developing bowel necrosis. 7. Small amount of free fluid in the abdomen and pelvis, progressing since previous study. These results were called by telephone at the time of interpretation on 11/22/2018 at 7:47 pm to Dr. Nanda Quinton , who verbally acknowledged these results. Aortic Atherosclerosis (ICD10-I70.0). Electronically Signed   By: Lucienne Capers M.D.   On: 11/22/2018 19:51   Ct Abdomen Pelvis W Contrast  Result Date: 11/22/2018 CLINICAL DATA:  Type midsternal and left-sided chest pain beginning today at 11 a.m. Back pain and spasms. Generalized abdominal pain. EXAM: CT ANGIOGRAPHY CHEST CT ABDOMEN AND PELVIS WITH CONTRAST TECHNIQUE: Multidetector CT imaging of  the chest was performed using the standard protocol during bolus administration of intravenous contrast. Multiplanar CT image reconstructions and MIPs were obtained to evaluate the vascular anatomy. Multidetector CT imaging of the abdomen and pelvis was performed using the standard protocol during bolus administration of intravenous contrast. CONTRAST:  135m ISOVUE-370 IOPAMIDOL (ISOVUE-370) INJECTION 76% COMPARISON:  CT abdomen and pelvis 11/19/2018 FINDINGS: CTA CHEST FINDINGS Cardiovascular: There is good opacification of the central and segmental  pulmonary arteries. No focal filling defects are demonstrated. No evidence of significant pulmonary embolus. Normal heart size. No pericardial effusions. Normal caliber thoracic aorta. No dissection. Great vessel origins are patent. Mediastinum/Nodes: Mediastinal lymph nodes are not pathologically enlarged. Esophagus is decompressed. Lungs/Pleura: Motion artifact limits evaluation. There is some linear atelectasis in the left mid lung. Mosaic attenuation pattern to the lungs is probably due to motion artifact but could also indicate air trapping or edema. No pleural effusions. No pneumothorax. Airways are patent. Musculoskeletal: No chest wall abnormality. No acute or significant osseous findings. Review of the MIP images confirms the above findings. CT ABDOMEN and PELVIS FINDINGS Hepatobiliary: No focal liver abnormality is seen. No gallstones, gallbladder wall thickening, or biliary dilatation. Pancreas: Unremarkable. No pancreatic ductal dilatation or surrounding inflammatory changes. Spleen: Normal in size without focal abnormality. Adrenals/Urinary Tract: Adrenal glands are unremarkable. Kidneys are normal, without renal calculi, focal lesion, or hydronephrosis. Focal bladder wall thickening superiorly, see below. Stomach/Bowel: Stomach and small bowel are decompressed. Segmental wall thickening of the sigmoid colon with abrupt shouldered margins and stranding in the adjacent fat. Adjacent to the colon, there is an enhancing area of infiltration contiguous with the bladder wall along with bladder wall thickening. This could represent diverticulitis with fistula formation although the abrupt margins of the lesion are suspicious for colon cancer in which case this would be locally invasive. There is evidence of proximal colonic obstruction with interval development of moderate colonic distention with gas and air-fluid levels. Peripheral gas along the nondependent surfaces in the right colon indicates pneumatosis.  No portal venous gas is identified although there is mesenteric infiltration and edema. There is also edema in the pericolic gutters and pelvis. Vascular/Lymphatic: Scattered aortic calcifications. No aneurysm. No significant lymphadenopathy. Reproductive: Prostate is unremarkable. Other: Small amount of free fluid in the abdomen and pelvis, progressing since previous study. No free air. Abdominal wall musculature appears intact. Musculoskeletal: Mild degenerative changes in the spine. No destructive bone lesions. Review of the MIP images confirms the above findings. IMPRESSION: 1. No evidence of significant pulmonary embolus. 2. Linear atelectasis in the left lung. 3. Mosaic attenuation pattern to the lungs is probably due to motion artifact but could indicate air trapping or edema. 4. Segmental wall thickening of the sigmoid colon with abrupt shouldered margins and stranding in the adjacent fat. Adjacent to the colon, there is an enhancing area of infiltration contiguous with the bladder wall along with bladder wall thickening. This could represent diverticulitis with fistula formation although the abrupt margins of the lesion are suspicious for colon cancer in which case this would be locally invasive. Surgical consultation is recommended. 5. Interval development of proximal colonic obstruction with gas and fluid levels. 6. Interval development of right colonic pneumatosis. This may be related to obstruction or could indicate developing bowel necrosis. 7. Small amount of free fluid in the abdomen and pelvis, progressing since previous study. These results were called by telephone at the time of interpretation on 11/22/2018 at 7:47 pm to Dr. JNanda Quinton, who verbally acknowledged  these results. Aortic Atherosclerosis (ICD10-I70.0). Electronically Signed   By: Lucienne Capers M.D.   On: 11/22/2018 19:51   Dg Abd Acute 2+v W 1v Chest  Result Date: 11/23/2018 CLINICAL DATA:  Large bowel obstruction EXAM: DG  ABDOMEN ACUTE W/ 1V CHEST COMPARISON:  Abdominal CT from yesterday FINDINGS: New large volume pneumoperitoneum. Pneumatosis is again seen along the distended proximal colon. No dilated small bowel. Low volume chest with atelectatic opacities.  Normal heart size. These results were called by telephone at the time of interpretation on 11/23/2018 at 6:59 am to Dr. Ky Barban, who verbally acknowledged these results. IMPRESSION: 1. Interval perforation with large volume pneumoperitoneum. 2. Colonic distension with ascending segment pneumatosis. Electronically Signed   By: Monte Fantasia M.D.   On: 11/23/2018 07:00    Danna Hefty, DO 11/23/2018, 3:21 PM PGY-1, [facility] Intern pager: [phone], text pages welcome

## 2018-11-23 NOTE — Discharge Summary (Addendum)
CALL PAGER [phone] for any questions or notifications regarding this patient   FMTS Attending Daily Note: [name], MD  Pager [phone]  Office [phone] I have seen and examined this patient, reviewed their chart. I have discussed this patient with the resident. I agree with the resident's findings, assessment and care plan.  New oxygen requirement, likely multifactorial. Treated with broad spectrum ABX for suspected pneumonia. Has had evidence of pulmonary edema (due to IV fluids), this is improving on exam. Recommend PFTs at follow up.      [facility] Discharge Summary  Patient name: [name] record number: [id number] Date of birth: 11/17/1980 Age: 38 y.o. Gender: male Date of Admission: 11/22/2018  Date of Discharge: 11/30/18 Admitting Physician: Dickie La, MD  Primary Care Provider: Lovenia Kim, MD Consultants: General Surgery  Indication for Hospitalization: Large Bowel Obstruction 2/2 Diverticulitis   Discharge Diagnoses/Problem List:  Colon obstruction Diverticulitis  Hypoxemia Tachycardia   Disposition: Home w/ home health  Discharge Condition: Stable  Discharge Exam:  General: well nourished, well developed, in no acute distress with non-toxic appearance HEENT: normocephalic, atraumatic, moist mucous membranes Neck: supple, non-tender without lymphadenopathy CV: 1/6 systolic ejection murmur. No BLEE Lungs: clear to auscultation bilaterally with normal work of breathing. [place] in place at 2L Abdomen: Wound vac with good seal. Terminal ileostomy and colostomy site with bags attached. Sites appear intact and without infection.  Extremities: warm and well perfused, normal tone MSK: ROM grossly intact, strength intact, gait normal Neuro: Alert and oriented, speech normal  Brief Hospital Course:  [name] is a 38 y.o. male with past medical history significant for bipolar disorder, substance abuse, who presented with  abdominal pain and found to have a large bowel obstruction.  Initial work up with significant for obstruction seen on CT scan. Patient was intially admitted to [facility] medicine teaching service and started on aztreonam and flagyl.  Within a few hours of admission, his abdominal pain acutely worsened.  Abdominal X rays were ordered and he was found to have free air under his diaphragm . He was taken to emergent exploratory laparoscopy. 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 Post op, midline wound vac was placed. Path results were significant for cecal perforation, adhesions, diverticulosis and diverticulitis with fibrosis and strictures. CEA negative.  Post surgery, patient returned to the family medicine service and was initially started on flagyl and cefepime, transitioned to PO Cefdinir and Flagyl on 2/7 to complete a total of 14 day course of each.  Immediate postop pain was managed with Tylenol, Toradol, oxycodone, morphine as needed.  On postop day 2, patient was transitioned to scheduled Tylenol and as needed oxycodone 10 mg.  Due to increasing sleepiness on 10 mg of oxycodone, patient's dose was lowered to 2.5 - 5 mg for breakthrough pain.  In the postop period, magnesium, phosphorus, potassium were monitored and repleted as needed.  Patient was also placed on Imodium for increased stool output which improved by the time of discharge.  Additionally, patient was placed on oxygen postop at 3 L nasal cannula.  Through the remainder of his admission, patient was weaned to room air at rest. Patient was using incentive spirometry multiple times throughout the day, had clear lung sounds, and remained afebrile.  Chest x-ray was obtained to rule out pneumonia and showed bilateral fluffiness consistent with pulmonary edema.  Patient was treated with Lasix x2 days with  good urine output.  In anticipation of discharge, patient was  evaluated by respiratory therapy for home oxygen needs and discharged on PRN oxygen with activity as he was noted to desat to 88% with ambulation.  Patient will need oxygen reevaluation at PCP follow-up.  He was reminded multiple times to not smoke while using oxygen machine or near oxygen machine. General surgery arranged for outpatient follow-up with ostomy clinic as well as postop follow-up.  Home health wound care and PT were both provided to patient.  Of note, patient had remote history of Becker's muscular dystrophy and there was concern of possible onset in the setting of pulmonary edema, new oxygen requirement, and subjective reports of amber-colored urine for the last 2 months.  A CK was obtained and was normal.  Per chart review, patient was seen by Dr. 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  No other labs were performed during his admission.  Another possible cause of hematuria was seen on CT with bladder wall thickening, and patient would benefit from urology follow-up outpatient.  Other findings during admission included daily fasting hyperglycemia and patient was started on sensitive sliding scale.  He will need continued follow-up for diabetes.  Should any other neurologic abnormalities be seen, patient would benefit from follow-up with neurology as he does not remember any work-up with Dr. Gaynell Face from when he was a child.  Issues for Follow Up:  1. Reassess oxygen requirement at outpatient follow-up Dr. Reesa Chew on 2/18 oxygen requirement only when ambulating (88%) 2. Continue to follow patient's A1c.  Likely not candidate for metformin in the setting of bowel surgeries. 3. Referral for outpatient follow-up with urology for hematuria 4. Tobacco cessation counseling with family history of COPD  Significant Procedures: none  Significant Labs and  Imaging:  CBC: Recent Labs  Lab 11/26/18 0549 11/27/18 0402 11/28/18 0524 11/29/18 0305 11/30/18 0310  WBC 14.6* 18.6* 16.2* 13.5* 13.7*  NEUTROABS  --   --   --  9.7*  --   HGB 10.2* 10.0* 9.6* 9.5* 9.2*  HCT 29.4* 29.1* 28.3* 29.5* 28.9*  MCV 83.3 84.8 85.5 86.3 85.8  PLT 188 218 250 275 161   Basic Metabolic Panel: Recent Labs  Lab 11/26/18 0549 11/27/18 0402 11/28/18 0524 11/29/18 0305 11/30/18 0310  NA 134* 136 136 137 134*  K 3.5 3.2* 3.3* 3.9 3.7  CL 99 99 97* 97* 95*  CO2 '26 29 29 28 28  '$ GLUCOSE 136* 154* 171* 151* 151*  BUN '9 8 8 10 11  '$ CREATININE 0.94 0.83 0.84 0.86 0.79  CALCIUM 7.8* 7.8* 8.1* 8.0* 7.9*  MG  --  1.9 1.9  --   --   PHOS  --  3.3 3.9 4.8*  --    GFR: Estimated Creatinine Clearance: 153.8 mL/min (by C-G formula based on SCr of 0.79 mg/dL). Liver Function Tests: Recent Labs  Lab 11/24/18 0324 11/26/18 0549 11/27/18 0402 11/28/18 0524 11/29/18 0305  AST 47* 37  --  30  --   ALT 50* 35  --  26  --   ALKPHOS 43 61  --  73  --   BILITOT 1.1 1.1  --  0.6  --   PROT 4.8* 5.3*  --  5.9*  --   ALBUMIN 2.2* 2.0* 1.9* 1.8*  1.8* 1.9*   Cardiac Enzymes: Recent Labs  Lab 11/29/18 0305  CKTOTAL 62   CBG: Recent Labs  Lab 11/29/18 1227 11/29/18 1805 11/29/18 2115 11/30/18 0805 11/30/18 1255  GLUCAP 153* 175* 148* 160* 178*   Urine analysis:    Component Value Date/Time   COLORURINE AMBER (A) 11/23/2018 2036   APPEARANCEUR CLEAR 11/23/2018 2036   LABSPEC 1.024 11/23/2018 2036   PHURINE 5.0 11/23/2018 2036   GLUCOSEU NEGATIVE 11/23/2018 2036   HGBUR SMALL (A) 11/23/2018 2036   HGBUR negative 10/20/2007 [place] 11/23/2018 2036   BILIRUBINUR negative 09/10/2018 [place] 11/23/2018 2036   PROTEINUR 30 (A) 11/23/2018 2036   UROBILINOGEN 2.0 (A) 09/10/2018 1630   UROBILINOGEN 2.0 (H) 06/15/2009 1156   NITRITE NEGATIVE 11/23/2018 2036   LEUKOCYTESUR NEGATIVE 11/23/2018 2036    Results/Tests  Pending at Time of Discharge: none  Discharge Medications:  Allergies as of 11/30/2018      Reactions   Penicillins Anaphylaxis, Shortness Of Breath, Swelling   Did it involve swelling of the face/tongue/throat, SOB, or low BP? Yes Did it involve sudden or severe rash/hives, skin peeling, or any reaction on the inside of your mouth or nose? Unk Did you need to seek medical attention at a hospital or doctor's office? No When did it last happen?Unk If all above answers are "NO", may proceed with cephalosporin use. Got ceftriaxone before   Amoxicillin Hives   Per patient's mother   Metformin And Related Other (See Comments)   Stomach cramps      Medication List    STOP taking these medications   ciprofloxacin 500 MG tablet Commonly known as:  CIPRO   diphenhydramine-acetaminophen 25-500 MG Tabs tablet Commonly known as:  TYLENOL PM   ibuprofen 400 MG tablet Commonly known as:  ADVIL,MOTRIN   Melatonin 10 MG Tabs     TAKE these medications   ACCU-CHEK FASTCLIX LANCETS Misc Check blood sugar once daily   ACCU-CHEK NANO SMARTVIEW w/Device Kit Check blood sugar once daily   acetaminophen 500 MG tablet Commonly known as:  TYLENOL Take 500-1,000 mg by mouth every 6 (six) hours as needed (for pain).   atorvastatin 40 MG tablet Commonly known as:  LIPITOR Take 1 tablet (40 mg total) by mouth daily. What changed:  when to take this   cefdinir 300 MG capsule Commonly known as:  OMNICEF Take 1 capsule (300 mg total) by mouth every 12 (twelve) hours for 9 days.   feeding supplement (ENSURE ENLIVE) Liqd Take 237 mLs by mouth 2 (two) times daily between meals. Start taking on:  December 01, 2018   gabapentin 300 MG capsule Commonly known as:  NEURONTIN Take 300 mg by mouth daily.   glucose blood test strip Commonly known as:  ACCU-CHEK SMARTVIEW Check blood sugar once daily   lamoTRIgine 25 MG tablet Commonly known as:  LAMICTAL Take 1 tab daily for 1 week and than  2 tab daily   LATUDA 80 MG Tabs tablet Generic drug:  lurasidone Take 80 mg by mouth at bedtime.   meloxicam 15 MG tablet Commonly known as:  MOBIC Take 1 tablet (15 mg total) by mouth daily.   metroNIDAZOLE 500 MG tablet Commonly known as:  FLAGYL Take 1 tablet (500 mg total) by mouth every 8 (eight) hours for 5 days. What changed:  when to take this   multivitamin with minerals Tabs tablet Take 1 tablet by mouth daily. Start taking on:  December 01, 2018   omeprazole 40 MG capsule Commonly  known as:  PRILOSEC TAKE 1 CAPSULE BY MOUTH EVERY DAY What changed:    how much to take  when to take this   ondansetron 4 MG disintegrating tablet Commonly known as:  ZOFRAN-ODT Take 4 mg by mouth every 8 (eight) hours as needed for nausea or vomiting.   traZODone 50 MG tablet Commonly known as:  DESYREL Take 50 mg by mouth at bedtime.   VIIBRYD 40 MG Tabs Generic drug:  Vilazodone HCl Take 40 mg by mouth daily.   VYVANSE 50 MG capsule Generic drug:  lisdexamfetamine Take 50 mg by mouth daily with breakfast.   VYVANSE 20 MG capsule Generic drug:  lisdexamfetamine Take 20 mg by mouth daily with lunch.            Durable Medical Equipment  (From admission, onward)         Start     Ordered   11/30/18 1543  For home use only DME oxygen  Once    Question Answer Comment  Mode or (Route) Nasal cannula   Liters per Minute 2   Frequency Continuous (stationary and portable oxygen unit needed)   Oxygen delivery system Gas      11/30/18 1542   11/29/18 2018  For home use only DME Shower stool  Once     11/29/18 2017   11/26/18 2148  For home use only DME Walker rolling  Once    Question:  Patient needs a walker to treat with the following condition  Answer:  Limited mobility   11/26/18 2148   11/26/18 2148  For home use only DME Bedside commode  Once    Question:  Patient needs a bedside commode to treat with the following condition  Answer:  Limited mobility    11/26/18 2148   11/25/18 1201  For home use only DME Negative pressure wound device  Once    Question Answer Comment  Frequency of dressing change 3 times per week   Length of need 3 Months   Dressing type Foam   Amount of suction 125 mm/Hg   Pressure application Continuous pressure   Supplies 10 canisters and 15 dressings per month for duration of therapy      11/25/18 1201          Discharge Instructions: Please refer to Patient Instructions section of EMR for full details.  Patient was counseled important signs and symptoms that should prompt return to medical care, changes in medications, dietary instructions, activity restrictions, and follow up appointments.   Follow-Up Appointments: [facility] Follow up.   Contact information: [address] [place] [address] [phone]        Kinsinger, Arta Bruce, MD. Go on 12/15/2018.   Specialty:  General Surgery Why:  at 2:30pm. Please arrive 20 minutes prior to complete paperwork. Please bring photo ID and insurance card.  Contact information: [address] [place] [address] [phone]           Future Appointments  Date Time Provider [place]  12/07/2018  3:10 PM Lovenia Kim, MD [facility]     Wilber Oliphant, MD 11/30/2018, 6:41 PM PGY-1, [place]

## 2018-11-23 NOTE — Op Note (Signed)
Preoperative diagnosis: perforated viscous  Postoperative diagnosis: sigmoid stricture, perforated cecum  Procedure: exploratory laparotomy, resection of right colon with end ileostomy, decompressive transverse colostomy, resection of sigmoid colon, placement of 120cm^2 wound vac placement  Surgeon: Gurney Maxin, M.D.  Asst: Alferd Apa, [facility]  Anesthesia: general  Indications for procedure: [name] is a 38 y.o. year old male with symptoms of abdominal pain with findings of large bowel obstruction. He acutely worsened this morning and was found to have a large amount of free air on CXR. After discussing the findings with the patient the decision was made for resection of colon with placement of ostomy.  Description of procedure: The patient was brought into the operative suite. Anesthesia was administered with General endotracheal anesthesia. WHO checklist was applied. The patient was then placed in supine position. The area was prepped and draped in the usual sterile fashion.  Next, cautery was used to make a midline incision dissecting down through subcutaneous tissues.  The fascia was identified and entered in the midline.  Upon entry into the peritoneal cavity, there is a large amount of air and fluid released.  Majority of the fluid was then removed with suction.  Initially inspected the pelvis and there was a firm area consistent with sigmoid colon adhered to the abdominal wall.  I then turned my attention towards the remainder of the colon and identified a full-thickness perforation on the medial aspect of the cecum.  Large amount of liquid stool poured out into the peritoneal cavity which was removed and the cecum was further decompressed with suction.  Next, blunt dissection was used to free the cecum by dissecting the white line of [place].  The colon was very dilated.  Terminal ileum was identified and divided about 10 cm from the ileocecal valve with a GIA stapler.  LigaSure was then  used to divide the mesentery of the right colon.  Blunt dissection was used to free the hepatic flexure of the colon.  Portion on the right side of the transverse colon was chosen for transection and an additional GIA blue load stapler was used to divide this area.    Next, attention was turned back to the sigmoid area blunt dissection was used to free the sigmoid colon away from the abdominal wall.  Normal dissection was used to free the left colon from the abdominal wall.  The ureter was identified going over the iliac artery and left alone.  Portion on the distal left colon was chosen for transection and a GIA stapler was used to divide the colon in this area.  Next, LigaSure was used to divide the mesentery the IMV was identified and divided and then a 3-0 silk stick tie was used to ligate the vessel.  LigaSure was then used to divide the remainder of the mesentery going down towards the rectum.  A blue load contour stapler was used to divide the rectosigmoid junction.  Specimen was removed.  On examining the left colon, distal aspect along the staple line appeared purple and an additional 6 cm of colon was taken with an additional GIA stapler firing.  2-0 Prolene was known to the rectal staple line on each side and a 2-0 Prolene was sewn into the distal left colon staple line on each side.  [place] drain was then brought through the left lower quadrant abdomen such that the draining part was placed in the pelvis and along the right abdominal wall.  The drain was sutured in place  with 2-0 nylon.  The terminal ileum was identified and the proximal transverse colon was identified.  A circular incision was made in the right upper quadrant cautery was used dissect down through the subcutaneous tissues and the the rectus sheath was identified and divided longitudinally the muscle fibers were divided longitudinally and the posterior rectus sheath was divided.  Initially attempted to bring up both the  terminal ileum and transverse colon through this area but there is not enough room and the ileum did not reach very well to this high right upper quadrant site.  Therefore, I created an additional ostomy site in the right mid abdomen by making a circular incision dissecting down through subcutaneous tissues and dividing the rectus sheath anterior and posterior longitudinally.  Next, I brought the transverse colon up to the right upper quadrant ostomy site and the ileum to the right mid ostomy site.  Then irrigated the abdomen with multiple liters of warm saline.  I inspected the abdomen for hemostasis and found that intact.  Then closed the midline with 0 PDS in running fashion.  I then matured the transverse colostomy with interrupted 3-0 Vicryl's with a small amount of Brooking.  I then matured the terminal ileum ileostomy with 3-0 Vicryl's in interrupted fashion with a 4 cm brooking.  The ostomy appliance was put over the decompressive colostomy because there was a considerable amount of liquid feces draining from the area.  Once that was under control, a black sponge VAC was placed over the midline wound.  The midline wound measured 22 cm x 6 cm.  Additional ostomy appliance was put over the lower ileostomy site.  Patient awoke from anesthesia was brought to PACU in stable condition.  All counts were correct.  Findings: perforated cecum, firmness in sigmoid with adhesions to anterior pelvis, no lymphadenopathy noted along sigmoid mesentery.  Specimen: right colon, sigmoid colon with additional proximal colon  Implant: 19 fr blake drain LLQ with tip in pelvis and along right abdominal wall   Blood loss: 262ml  Local anesthesia: none  Complications: none  Gurney Maxin, M.D. General, Bariatric, & Minimally Invasive Surgery [facility]

## 2018-11-23 NOTE — Anesthesia Procedure Notes (Signed)
Procedure Name: Intubation Date/Time: 11/23/2018 9:39 AM Performed by: Trinna Post., CRNA Pre-anesthesia Checklist: Patient identified, Emergency Drugs available, Suction available, Patient being monitored and Timeout performed Patient Re-evaluated:Patient Re-evaluated prior to induction Oxygen Delivery Method: Circle system utilized Preoxygenation: Pre-oxygenation with 100% oxygen Induction Type: IV induction and Cricoid Pressure applied Ventilation: Mask ventilation with difficulty and Oral airway inserted - appropriate to patient size Laryngoscope Size: Mac and 4 Grade View: Grade I Tube type: Oral Tube size: 7.5 mm Number of attempts: 1 Airway Equipment and Method: Stylet Placement Confirmation: ETT inserted through vocal cords under direct vision,  positive ETCO2 and breath sounds checked- equal and bilateral Secured at: 22 cm Tube secured with: Tape Dental Injury: Teeth and Oropharynx as per pre-operative assessment

## 2018-11-23 NOTE — Progress Notes (Signed)
Was called to pt room by his RN due to increased pain.  When I went to his room, the patient's pain was much worse than when I had seen him in the ED about an hour before.  Abdominal Xrays were ordered and patient was given $RemoveB'2mg'igxUJYIF$  morphine.  X rays showed free air under the diaphragm and surgery was called and notified of possible bowel perforation.

## 2018-11-23 NOTE — Progress Notes (Signed)
Pt's HR remaining in the 130's - 140's and slightly diaphoretic.  BP 99/85.  Pain controled.  MD notified.  Will continue to monitor.  [name]

## 2018-11-23 NOTE — Anesthesia Postprocedure Evaluation (Signed)
Anesthesia Post Note  Patient: [name]  Procedure(s) Performed: TOTAL COLECTOMY (N/A Abdomen) ILEOSTOMY (N/A Abdomen) Application Of Wound Vac (N/A Abdomen)     Patient location during evaluation: PACU Anesthesia Type: General Level of consciousness: awake and alert Pain management: pain level controlled Vital Signs Assessment: post-procedure vital signs reviewed and stable Respiratory status: spontaneous breathing, nonlabored ventilation and respiratory function stable Cardiovascular status: blood pressure returned to baseline and stable Postop Assessment: no apparent nausea or vomiting Anesthetic complications: no    Last Vitals:  Vitals:   11/23/18 1241 11/23/18 1300  BP: 97/67 96/73  Pulse: (!) 122 (!) 119  Resp: (!) 22   Temp: 37.3 C 36.9 C  SpO2: 97%     Last Pain:  Vitals:   11/23/18 1241  TempSrc:   PainSc: [place] Lupe Bonner

## 2018-11-23 NOTE — Progress Notes (Signed)
Pharmacy Antibiotic Note  [name] is a 38 y.o. male admitted on 11/22/2018 with Intra-abdominal infection.    He has gotten ceftriaxone in the past along with cephalexin. D/w Dr. Tarry Kos today and we will change aztreonam to ceftriaxone.   Significant events: 2/3: Abdominal CT: Adjacent to the colon, there is an enhancing area of infiltration contiguous with the bladder wall along with bladder wall thickening. This could represent diverticulitis with fistula formation although the abrupt margins of the lesion are suspicious for colon cancer in which case this would be locally invasive. There is evidence of proximal colonic obstruction with interval development of moderate colonic distention with gas and air-fluid levels.  Plan: Dc aztreonam Ceftriaxone 2g IV q24 Cont flagyl  Height: $Remove'5\' 8"'qFCHyry$  (172.7 cm) Weight: 248 lb (112.5 kg) IBW/kg (Calculated) : 68.4  Temp (24hrs), Avg:99.2 F (37.3 C), Min:98.5 F (36.9 C), Max:100.4 F (38 C)  Recent Labs  Lab 11/22/18 1549 11/22/18 1645 11/22/18 1700 11/23/18 0208  WBC 9.8  --   --  7.1  CREATININE  --   --  1.15 1.02  LATICACIDVEN  --  1.0  --  0.8    Estimated Creatinine Clearance: 120.6 mL/min (by C-G formula based on SCr of 1.02 mg/dL).    Allergies  Allergen Reactions  . Penicillins Anaphylaxis, Shortness Of Breath and Swelling    Did it involve swelling of the face/tongue/throat, SOB, or low BP? Yes Did it involve sudden or severe rash/hives, skin peeling, or any reaction on the inside of your mouth or nose? Unk Did you need to seek medical attention at a hospital or doctor's office? No When did it last happen?Unk If all above answers are "NO", may proceed with cephalosporin use. Got ceftriaxone before   . Metformin And Related Other (See Comments)    Stomach cramps     Onnie Boer, PharmD, BCIDP, AAHIVP, CPP Infectious Disease Pharmacist 11/23/2018 2:27 PM

## 2018-11-23 NOTE — Progress Notes (Signed)
FPTS Interim Progress Note  Evaluated patient at bedside due to persistent tachycardia following abdominal surgery earlier today.  On exam, patient diaphoretic, tachycardic and endorsing left-sided, subcostal chest pain that radiates "everywhere."  Heart regular rhythm, tachycardic to 130s.  Decreased breath sounds bilaterally with poor respiratory effort, likely due to pain. O2 sats 92% on 2L. Surgical site clean and dry with intact ostomy bag and wound VAC without surrounding erythema or drainage.  Dark urine in Foley bag.  Receiving IV fluids, s/p 2L NS bolus earlier today.  Afebrile, BP low normal.  Suspect pain not fully under great control inhibiting ability to take deep breaths.  Will schedule Tylenol instead of as needed and encouraged patient to ask for morphine as needed.  Chest pain may be referred given abdominal surgery earlier today or due to demand given tachycardia.  Will obtain EKG, portable chest x-ray, and give prn morphine now with small additional IVF bolus.  Encouraged patient to use incentive spirometer to prevent atelectasis.    BP 128/69 (BP Location: Right Arm)   Pulse (!) 133   Temp 98.5 F (36.9 C) (Oral)   Resp (!) 24   Ht $R'5\' 8"'Uh$  (1.727 m)   Wt 112.5 kg   SpO2 92%   BMI 37.71 kg/m    Rory Percy, DO 11/23/2018, 10:24 PM PGY-2, [place] Service pager [phone]

## 2018-11-23 NOTE — Transfer of Care (Signed)
Immediate Anesthesia Transfer of Care Note  Patient: [name]  Procedure(s) Performed: TOTAL COLECTOMY (N/A Abdomen) ILEOSTOMY (N/A Abdomen) Application Of Wound Vac (N/A Abdomen)  Patient Location: PACU  Anesthesia Type:General  Level of Consciousness: drowsy  Airway & Oxygen Therapy: Patient Spontanous Breathing and Patient connected to face mask oxygen  Post-op Assessment: Report given to RN and Post -op Vital signs reviewed and stable  Post vital signs: Reviewed and stable  Last Vitals:  Vitals Value Taken Time  BP 102/73 11/23/2018 12:11 PM  Temp    Pulse 124 11/23/2018 12:15 PM  Resp 21 11/23/2018 12:15 PM  SpO2 93 % 11/23/2018 12:15 PM  Vitals shown include unvalidated device data.  Last Pain:  Vitals:   11/23/18 0715  TempSrc:   PainSc: 6       Patients Stated Pain Goal: 6 (74/25/95 6387)  Complications: No apparent anesthesia complications

## 2018-11-23 NOTE — Progress Notes (Addendum)
0310 patient woken with sharp, cramping pain lower abdomen, hurt to take a deep breath. Improved when sitting and then standing bedside. Paged provider.   K pad ordered.  [phone] paged provider per patient request.  Pain lower abdomen, radiating to back. "I can't take deep breaths," O2 94.  HR sustained 130's-140's.  Provider saw patient, pain med ordered.  0240 paged provider notification patient is now asleep, HR still 138-144.  0530 paged provider notification sustained HR 150s.

## 2018-11-24 ENCOUNTER — Encounter (HOSPITAL_COMMUNITY): Payer: Self-pay | Admitting: General Surgery

## 2018-11-24 DIAGNOSIS — E876 Hypokalemia: Secondary | ICD-10-CM

## 2018-11-24 DIAGNOSIS — D72829 Elevated white blood cell count, unspecified: Secondary | ICD-10-CM

## 2018-11-24 DIAGNOSIS — K56609 Unspecified intestinal obstruction, unspecified as to partial versus complete obstruction: Secondary | ICD-10-CM

## 2018-11-24 LAB — COMPREHENSIVE METABOLIC PANEL
ALK PHOS: 43 U/L (ref 38–126)
ALT: 50 U/L — ABNORMAL HIGH (ref 0–44)
AST: 47 U/L — ABNORMAL HIGH (ref 15–41)
Albumin: 2.2 g/dL — ABNORMAL LOW (ref 3.5–5.0)
Anion gap: 11 (ref 5–15)
BUN: 15 mg/dL (ref 6–20)
CO2: 18 mmol/L — ABNORMAL LOW (ref 22–32)
Calcium: 6.8 mg/dL — ABNORMAL LOW (ref 8.9–10.3)
Chloride: 106 mmol/L (ref 98–111)
Creatinine, Ser: 1.08 mg/dL (ref 0.61–1.24)
GFR calc Af Amer: >60 mL/min (ref >60)
GFR calc non Af Amer: >60 mL/min (ref >60)
Glucose, Bld: 173 mg/dL — ABNORMAL HIGH (ref 70–99)
Potassium: 3.3 mmol/L — ABNORMAL LOW (ref 3.5–5.1)
Sodium: 135 mmol/L (ref 135–145)
Total Bilirubin: 1.1 mg/dL (ref 0.3–1.2)
Total Protein: 4.8 g/dL — ABNORMAL LOW (ref 6.5–8.1)

## 2018-11-24 LAB — CBC
HCT: 31.8 % — ABNORMAL LOW (ref 39.0–52.0)
HEMOGLOBIN: 10.9 g/dL — AB (ref 13.0–17.0)
MCH: 29.1 pg (ref 26.0–34.0)
MCHC: 34.3 g/dL (ref 30.0–36.0)
MCV: 85 fL (ref 80.0–100.0)
Platelets: 162 10*3/uL (ref 150–400)
RBC: 3.74 MIL/uL — AB (ref 4.22–5.81)
RDW: 13.5 % (ref 11.5–15.5)
WBC: 11.5 10*3/uL — ABNORMAL HIGH (ref 4.0–10.5)
nRBC: 0 % (ref 0.0–0.2)

## 2018-11-24 LAB — CEA: CEA: 2.6 ng/mL (ref 0.0–4.7)

## 2018-11-24 MED ORDER — LISDEXAMFETAMINE DIMESYLATE 30 MG PO CAPS
50.0000 mg | ORAL_CAPSULE | Freq: Every day | ORAL | Status: DC
  Administered 2018-11-24 – 2018-11-30 (×7): 50 mg via ORAL
  Filled 2018-11-24 (×7): qty 1

## 2018-11-24 MED ORDER — POTASSIUM CHLORIDE 10 MEQ/100ML IV SOLN
10.0000 meq | INTRAVENOUS | Status: AC
  Administered 2018-11-24 (×2): 10 meq via INTRAVENOUS
  Filled 2018-11-24 (×2): qty 100

## 2018-11-24 MED ORDER — SODIUM CHLORIDE 0.9 % IV SOLN
2.0000 g | Freq: Three times a day (TID) | INTRAVENOUS | Status: DC
  Administered 2018-11-24 – 2018-11-26 (×6): 2 g via INTRAVENOUS
  Filled 2018-11-24 (×7): qty 2

## 2018-11-24 MED ORDER — OXYCODONE HCL 5 MG PO TABS
5.0000 mg | ORAL_TABLET | ORAL | Status: DC | PRN
  Administered 2018-11-24 – 2018-11-25 (×3): 5 mg via ORAL
  Filled 2018-11-24 (×3): qty 1

## 2018-11-24 MED ORDER — DEXTROSE-NACL 5-0.45 % IV SOLN
INTRAVENOUS | Status: DC
  Administered 2018-11-24 – 2018-11-25 (×2): via INTRAVENOUS
  Administered 2018-11-26: 1 mL via INTRAVENOUS

## 2018-11-24 MED ORDER — ENSURE ENLIVE PO LIQD
237.0000 mL | Freq: Two times a day (BID) | ORAL | Status: DC
  Administered 2018-11-24 – 2018-11-30 (×6): 237 mL via ORAL

## 2018-11-24 MED ORDER — KETOROLAC TROMETHAMINE 15 MG/ML IJ SOLN
15.0000 mg | Freq: Three times a day (TID) | INTRAMUSCULAR | Status: DC
  Administered 2018-11-24 – 2018-11-25 (×4): 15 mg via INTRAVENOUS
  Filled 2018-11-24 (×4): qty 1

## 2018-11-24 NOTE — Progress Notes (Signed)
Patient asking often for soda and drinks. Educated on advancing diet as soon as surgery feels patient is ready. Explained nausea and vomiting that would result in advancing too soon. Several soda bottles in room by patient's bed but wife confirms that he is not drinking except sips given by nurse with meds.    Patient encouraged to use incentive spirometer and to deep breath and cough. Advised to use foam across abdomen to guard. Patient hesitant to do any of these "it hurts."

## 2018-11-24 NOTE — Progress Notes (Signed)
Family Medicine Teaching Service Daily Progress Note Intern Pager: [phone]  Patient name: [name] Medical record number: [id number] Date of birth: 1981/04/09 Age: 38 y.o. 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 male presenting with abdominal pain concerning for bowel obstruction. PMH is significant for Bipolar disorder, HLD, substance abuse, beckers muscular dystrophy  Leukocytosis and concern for HCAP: Elevation in white count overnight from 7.5 to 11.5 with persistent tachycardia to 130's. CXR with bilateral pulmonary edema vs developing pneumonia. Elevation in white count could be natural reaction from surgery. Additionally, pulmonary edema is likely given amount of fluid patient received yesterday (5L IN, 2L out) with shallow breaths given pain. However, will go ahead and broaden antibiotics and continue to monitor status. - Stop CTX (2/4-2/5) - Begin Cefepime (2/5-) - Continue Flagyl (2/3-) - monitor fever curve, vitals, AM CBC - incentive spirometry   Large bowel obstruction w/ possible colonic mass s/p colectomy  POD1 s/p exploratory laparotomy and partial colectomy (right colon and sigmoid colon) with ostomy and wound vac in place. No complications during or after procedure. Pain well controlled this morning with scheduled tylenol and toradol with PRN morphine and Oxy. Patient endorses passing  flatus this morning, tolerating sips of fluids. Persistently tachycardic overnight to 130's, improved to low 100's s/p 2.5L total fluid bolus' and scheduling of pain management. Otherwise, remained hemodynamically stable and afebrile overnight on 2.5L O2. Hemoglobin slightly down 13.4>10.9. Wound is well appearing without any signs of infection. Adequate stool in ostomy bag and 0.24 L fluid from wound vac.  - surgery following - antibiotics as above - incentive spirometry  - Scheduled tylenol q6h and toradol q8, morphine '2mg'$  q2 PRN, Oxy '5mg'$  q4 prn - zofran PRN - k-pad - continuous cardiac monitoring, continuous pulse ox - D51/2NS at 66m/hr   - regular diet per surgery  Elevated Transaminases: improving AST: 43>72, ALT 35>58, alk phos WNL. Expect irritation from acute bowel.  - Follow up AM CMP - Consider further work up if worsens  Dysuria: Noted dysuria and dark colored urine on admission. UA negative for infection. Denies any symptoms today.  Hypokalemia/Hypomagnesia: K 2.9 > 3.3 s/p 40 mEq IV K+ and 2g Mag - give 20 mEq IV K+ today  Pre-diabetes:  A1C 6.4 (up from 6.3). H/o of allergy to metformin. - sSSI - monitor CBG's  HLD:  Home meds: Lipitor '40mg'$ . Last lipid panel: LDL 169 - hold statin given increasing liver enzymes   Bipolar disorder - Home meds: Latuda '80mg'$  qHS, Viibryd '40mg'$  QD. Lamtorigine '50mg'$  QD on med list but not taking. - continue latuda - continue viibryd  ADHD - patient takes vyvanse '50mg'$  w/ breakfast, '20mg'$  at lunch.  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 Home med: gabapentin '300mg'$  QD - continue gabapentin  GERD - patient takes '40mg'$  prilosec at home - IV Protonix, switch to PO when no longer NPO  Insomnia - Home meds: Trazodone '50mg'$  qHS -  continue home meds   FEN/GI: Regular diet per surgery, replace electrolytes PRN, Protonix Prophylaxis: heparin subQ.   Disposition: pending improvement s/p  surgery  Subjective:  Patient did well overnight. Denies any current pain s/p morphine dose prior to exam. Does admit to pain with deep breaths. Able to use incentive spirometry during exam.   Objective: Temp:  [97.9 F (36.6 C)-99.4 F (37.4 C)] 97.9 F (36.6 C) (02/05 0437) Pulse Rate:  [99-140] 99 (02/05 0437) Resp:  [16-24] 16 (02/05 0437) BP: (99-128)/(69-86) 122/86 (02/05 0437) SpO2:  [91 %-95 %] 95 % (02/05 0437) Physical Exam: General: large, caucasian male, well nourished, well developed, in no acute distress with non-toxic appearance, lying in bed with nasal canula in place with wife at bedside  CV: regular rate and rhythm without murmurs, rubs, or gallops, no lower extremity edema, 2+ radial and pedal pulses bilaterally Lungs: some crackles in left lower lobe on exam, otherwise clear to auscultation with normal work of breathing on 2.5L [place] Abdomen: soft, tender, large midline incision with vacuum seal in place, ostomy bag with liquid brown stool and wound vac bag with small amount of dark blood Skin: warm, dry Extremities: warm and well perfused, normal tone  Laboratory: Recent Labs  Lab 11/23/18 0208 11/23/18 1425 11/24/18 0324  WBC 7.1 7.5 11.5*  HGB 13.0 13.4 10.9*  HCT 38.6* 39.1 31.8*  PLT 193 203 162   Recent Labs  Lab 11/22/18 1634  11/23/18 0208 11/23/18 1425 11/24/18 0324  NA  --    < > 135 133* 135  K  --    < > 2.9* 3.8 3.3*  CL  --    < > 102 103 106  CO2  --    < > 26 20* 18*  BUN  --    < > '11 18 15  '$ CREATININE  --    < > 1.02 1.34* 1.08  CALCIUM  --    < > 8.2* 7.5* 6.8*  PROT 7.6  --  6.7  --  4.8*  BILITOT 0.9  --  1.0  --  1.1  ALKPHOS 67  --  77  --  43  ALT 35  --  58*  --  50*  AST 43*  --  72*  --  47*  GLUCOSE  --    < > 146* 210* 173*   < > = values in this interval not displayed.   Hepatic Function Latest Ref Rng & Units 11/24/2018 11/23/2018 11/22/2018  Total Protein 6.5 - 8.1 g/dL 4.8(L) 6.7 7.6  Albumin 3.5 - 5.0 g/dL 2.2(L)  3.3(L) 3.9  AST 15 - 41 U/L 47(H) 72(H) 43(H)  ALT 0 - 44 U/L 50(H) 58(H) 35  Alk Phosphatase 38 - 126 U/L 43 77 67  Total Bilirubin 0.3 - 1.2 mg/dL 1.1 1.0 0.9  Bilirubin, Direct 0.0 - 0.2 mg/dL - - 0.3(H)   Lipase: 21 (nl) Troponin: <0.03 D-Dimer: 5.74 Lactic acid: 1.0>0.8 Mag: 1.5 A1C: 6.4  Imaging/Diagnostic Tests: Dg Chest 2 View - Result Date: 11/22/2018 CLINICAL DATA:  Severe chest pain. EXAM: CHEST - 2 VIEW COMPARISON:  Chest x-ray dated 04/26/2007 and abdominal radiographs dated 11/19/2018 FINDINGS: The heart size and pulmonary vascularity are normal. There is slight atelectasis at the left lung base. No consolidative infiltrates or effusions. No bone abnormality. Slight distention of the colon. IMPRESSION: Slight atelectasis at the left lung base which may be due to a shallow inspiration. Increased gaseous distention of the colon  since 11/19/2018. Electronically Signed   By: Lorriane Shire M.D.   On: 11/22/2018 16:39   Ct Angio Chest Pe W And/or Wo Contrast - Result Date: 11/22/2018 IMPRESSION: 1. No evidence of significant pulmonary embolus. 2. Linear atelectasis in the left lung. 3. Mosaic attenuation pattern to the lungs is probably due to motion artifact but could indicate air trapping or edema. 4. Segmental wall thickening of the sigmoid colon with abrupt shouldered margins and stranding in the adjacent fat. Adjacent to the colon, there is an enhancing area of infiltration contiguous with the bladder wall along with bladder wall thickening. This could represent diverticulitis with fistula formation although the abrupt margins of the lesion are suspicious for colon cancer in which case this would be locally invasive. Surgical consultation is recommended. 5. Interval development of proximal colonic obstruction with gas and fluid levels. 6. Interval development of right colonic pneumatosis. This may be related to obstruction or could indicate developing bowel necrosis. 7. Small amount of  free fluid in the abdomen and pelvis, progressing since previous study.   Ct Abdomen Pelvis W Contrast - Result Date: 11/22/2018 IMPRESSION: 1. No evidence of significant pulmonary embolus. 2. Linear atelectasis in the left lung. 3. Mosaic attenuation pattern to the lungs is probably due to motion artifact but could indicate air trapping or edema. 4. Segmental wall thickening of the sigmoid colon with abrupt shouldered margins and stranding in the adjacent fat. Adjacent to the colon, there is an enhancing area of infiltration contiguous with the bladder wall along with bladder wall thickening. This could represent diverticulitis with fistula formation although the abrupt margins of the lesion are suspicious for colon cancer in which case this would be locally invasive. Surgical consultation is recommended. 5. Interval development of proximal colonic obstruction with gas and fluid levels. 6. Interval development of right colonic pneumatosis. This may be related to obstruction or could indicate developing bowel necrosis. 7. Small amount of free fluid in the abdomen and pelvis, progressing since previous study.   Dg Chest Port 1 View - Result Date: 11/24/2018 CLINICAL DATA:  Chest pain EXAM: PORTABLE CHEST 1 VIEW COMPARISON:  11/22/2018 FINDINGS: Bilateral interstitial thickening, left greater than right with a lower lobe predominance. No pleural effusion or pneumothorax. Stable cardiomediastinal silhouette. No acute osseous abnormality. IMPRESSION: Bilateral interstitial thickening, left greater than right with a lower lobe predominance. Differential considerations include mild pulmonary edema versus multilobar pneumonia. Electronically Signed   By: Kathreen Devoid   On: 11/24/2018 00:54   Dg Abd Acute 2+v W 1v Chest - Result Date: 11/23/2018 CLINICAL DATA:  Large bowel obstruction EXAM: DG ABDOMEN ACUTE W/ 1V CHEST COMPARISON:  Abdominal CT from yesterday FINDINGS: New large volume pneumoperitoneum. Pneumatosis is  again seen along the distended proximal colon. No dilated small bowel. Low volume chest with atelectatic opacities.  Normal heart size. These results were called by telephone at the time of interpretation on 11/23/2018 at 6:59 am to Dr. Ky Barban, who verbally acknowledged these results. IMPRESSION: 1. Interval perforation with large volume pneumoperitoneum. 2. Colonic distension with ascending segment pneumatosis. Electronically Signed   By: Monte Fantasia M.D.   On: 11/23/2018 07:00   Danna Hefty, DO 11/24/2018, 3:54 PM PGY-1, [place] Intern pager: [phone], text pages welcome

## 2018-11-24 NOTE — Progress Notes (Signed)
Patient refused CPAP at this time. States he does not wear at home.Encouraged to call Respiratory if he would like to use CPAP.

## 2018-11-24 NOTE — Care Management Note (Signed)
Case Management Note  Patient Details  Name: [name] MRN: [id number] Date of Birth: [birth year]  Subjective/Objective:                    Action/Plan: Patient from home with wife.   PCP Dr Reesa Chew   Confirmed face sheet information.  Explained if he goes home with [facility] HHRN will change VAC dressing three time a week. Also, nurses here at hospital will begin teaching JP, and ostomy care. Patient voiced understanding.   [employer] VAC paperwork in shadow chart in case needed.  Left copy of Medicare.gov home health list with patient.   Will continue to follow.  Expected Discharge Date:                  Expected Discharge Plan:  [place]  In-House Referral:     Discharge planning Services  CM Consult  Post Acute Care Choice:  Home Health Choice offered to:  Patient  DME Arranged:    DME Agency:     HH Arranged:    [place] Agency:     Status of Service:  In process, will continue to follow  If discussed at Long Length of Stay Meetings, dates discussed:    Additional Comments:  Marilu Favre, RN 11/24/2018, 11:20 AM

## 2018-11-24 NOTE — Progress Notes (Signed)
Pharmacy Antibiotic Note  [name] is a 38 y.o. male admitted on 2/3/2020with Intra-abdominal infection.    He has gotten ceftriaxone in the past along with cephalexin. Pharmacy consulted to initiate cefepime for pseudomonas coverage and discontinue ceftriaxone.   Significant events: 2/3: Abdominal CT: Adjacent to the colon, there is an enhancing area of infiltration contiguous with the bladder wall along with bladder wall thickening. This could represent diverticulitis with fistula formation although the abrupt margins of the lesion are suspicious for colon cancer in which case this would be locally invasive. There is evidence of proximal colonic obstruction with interval development of moderate colonic distention with gas and air-fluid levels.  Plan: Discontinue ceftriaxone Cefepime 2g IV every 12 hours Continue flagyl Follow up clinical status, cultures, and length of therapy.   Height: $Remove'5\' 8"'[id number]$  (172.7 cm) Weight: 248 lb (112.5 kg) IBW/kg (Calculated) : 68.4  Temp (24hrs), Avg:98.6 F (37 C), Min:97.9 F (36.6 C), Max:99.4 F (37.4 C)  Recent Labs  Lab 11/22/18 1549 11/22/18 1645 11/22/18 1700 11/23/18 0208 11/23/18 1425 11/24/18 0324  WBC 9.8  --   --  7.1 7.5 11.5*  CREATININE  --   --  1.15 1.02 1.34* 1.08  LATICACIDVEN  --  1.0  --  0.8  --   --     Estimated Creatinine Clearance: 113.9 mL/min (by C-G formula based on SCr of 1.08 mg/dL).    Allergies  Allergen Reactions  . Penicillins Anaphylaxis, Shortness Of Breath and Swelling    Did it involve swelling of the face/tongue/throat, SOB, or low BP? Yes Did it involve sudden or severe rash/hives, skin peeling, or any reaction on the inside of your mouth or nose? Unk Did you need to seek medical attention at a hospital or doctor's office? No When did it last happen?Unk If all above answers are "NO", may proceed with cephalosporin use. Got ceftriaxone before   . Metformin And Related Other (See Comments)   Stomach cramps    Cultures:  Urine Cx 2/4:  MRSA PCR 2/4: Negative   Gwenlyn Found, [place] D PGY1 Pharmacy Resident  Phone [phone] 11/24/2018   12:28 PM

## 2018-11-25 ENCOUNTER — Encounter (HOSPITAL_COMMUNITY): Payer: Self-pay | Admitting: General Practice

## 2018-11-25 LAB — BASIC METABOLIC PANEL
Anion gap: 10 (ref 5–15)
BUN: 11 mg/dL (ref 6–20)
CO2: 24 mmol/L (ref 22–32)
Calcium: 8.1 mg/dL — ABNORMAL LOW (ref 8.9–10.3)
Chloride: 99 mmol/L (ref 98–111)
Creatinine, Ser: 0.94 mg/dL (ref 0.61–1.24)
GFR calc Af Amer: >60 mL/min (ref >60)
GFR calc non Af Amer: >60 mL/min (ref >60)
Glucose, Bld: 140 mg/dL — ABNORMAL HIGH (ref 70–99)
POTASSIUM: 3.5 mmol/L (ref 3.5–5.1)
Sodium: 133 mmol/L — ABNORMAL LOW (ref 135–145)

## 2018-11-25 LAB — URINE CULTURE: Culture: NO GROWTH

## 2018-11-25 LAB — CBC
HEMATOCRIT: 29.6 % — AB (ref 39.0–52.0)
HEMOGLOBIN: 10.1 g/dL — AB (ref 13.0–17.0)
MCH: 28.9 pg (ref 26.0–34.0)
MCHC: 34.1 g/dL (ref 30.0–36.0)
MCV: 84.8 fL (ref 80.0–100.0)
Platelets: 144 10*3/uL — ABNORMAL LOW (ref 150–400)
RBC: 3.49 MIL/uL — ABNORMAL LOW (ref 4.22–5.81)
RDW: 13.5 % (ref 11.5–15.5)
WBC: 9.2 10*3/uL (ref 4.0–10.5)
nRBC: 0 % (ref 0.0–0.2)

## 2018-11-25 MED ORDER — MORPHINE SULFATE (PF) 2 MG/ML IV SOLN
2.0000 mg | INTRAVENOUS | Status: DC | PRN
  Administered 2018-11-25: 2 mg via INTRAVENOUS
  Filled 2018-11-25: qty 1

## 2018-11-25 MED ORDER — ATORVASTATIN CALCIUM 40 MG PO TABS
40.0000 mg | ORAL_TABLET | Freq: Every day | ORAL | Status: DC
  Administered 2018-11-25 – 2018-11-29 (×5): 40 mg via ORAL
  Filled 2018-11-25 (×5): qty 1

## 2018-11-25 MED ORDER — ALUM & MAG HYDROXIDE-SIMETH 200-200-20 MG/5ML PO SUSP
30.0000 mL | ORAL | Status: DC | PRN
  Administered 2018-11-25: 30 mL via ORAL
  Filled 2018-11-25: qty 30

## 2018-11-25 MED ORDER — OXYCODONE HCL 5 MG PO TABS
5.0000 mg | ORAL_TABLET | ORAL | Status: DC | PRN
  Administered 2018-11-25 – 2018-11-26 (×4): 10 mg via ORAL
  Filled 2018-11-25 (×4): qty 2

## 2018-11-25 NOTE — Plan of Care (Signed)
  Problem: Health Behavior/Discharge Planning: Goal: Ability to manage health-related needs will improve Outcome: Progressing   Problem: Clinical Measurements: Goal: Ability to maintain clinical measurements within normal limits will improve Outcome: Progressing Goal: Will remain free from infection Outcome: Progressing   Problem: Activity: Goal: Risk for activity intolerance will decrease Outcome: Progressing   Problem: Nutrition: Goal: Adequate nutrition will be maintained Outcome: Progressing   Problem: Coping: Goal: Level of anxiety will decrease Outcome: Progressing   Problem: Pain Managment: Goal: General experience of comfort will improve Outcome: Progressing   Problem: Safety: Goal: Ability to remain free from injury will improve Outcome: Progressing   Problem: Skin Integrity: Goal: Risk for impaired skin integrity will decrease Outcome: Progressing   Problem: Clinical Measurements: Goal: Ability to maintain clinical measurements within normal limits will improve Outcome: Progressing Goal: Postoperative complications will be avoided or minimized Outcome: Progressing   Problem: Skin Integrity: Goal: Demonstration of wound healing without infection will improve Outcome: Progressing

## 2018-11-25 NOTE — Progress Notes (Signed)
Wound Check  Seen with WOC RN. Midline wound with some granulation, small amount of green-tinge at edge of umbilicus.  Transverse colostomy with some necrotic slough and appears more retracted with ostomy bag off. Ileostomy red.    Brigid Re , [facility] Surgery 11/25/2018, 11:45 AM Pager: [phone]

## 2018-11-25 NOTE — Consult Note (Signed)
[place] Nurse wound consult note Patient receiving care in [place].  Spouse present in room, as well as PA [employer] and [employer]. Reason for Consult: Abdominal VAC change and ostomy care Wound type: Midline abdominal incision receiving NPWT. This wound measures 23 cm x 6.8 cm x 7 cm.  It is 100% clean, pink granulation tissue. No odor. Drainage (amount, consistency, odor) Serosanginous in cannister Periwound: Intact Dressing procedure/placement/frequency: MWF VAC dressing change requiring one large black foam.  One piece of black foam removed from wound bed. Two large and 3 small pieces of black foam placed into wound bed.  Immediate seal obtained. [place] Nurse ostomy follow up Stoma type/location: RUQ Transverse colostomy. Measures 1 3/4 inches round, red, moist, retracted, some edema, all sutures intact. No output in the existing pouch. Right mid ileostomy stoma present, slightly budded, red, moist, measures 1.5 inches. Scant brown liquid in existing pouch. Peristomal assessment: All peristomal skin intact Treatment options for stomal/peristomal skin: barrier ring Ostomy pouching: 1pc. Cut to fit for upper transverse stoma. 2pc. Cut to fit with barrier ring for mid ileostomy. Education provided: Patient and spouse able to open/close sample pouch and snap together and separate the two piece pouching systems without difficult.  Both were shown how to measure the stomas.  The spouse participated in cutting the barrier openings to the correct size.  Both observed placement of the barrier ring around the ileostomy. They were educated to wash around the stomas with water only. [place] team will follow for wound and ostomy care and needs. Val Riles, RN, MSN, CWOCN, CNS-BC, pager [phone]

## 2018-11-25 NOTE — Progress Notes (Signed)
Patient refused CPAP tonight 

## 2018-11-25 NOTE — Care Management Important Message (Signed)
Important Message  Patient Details  Name: [name] MRN: [id number] Date of Birth: 03-04-1981   Medicare Important Message Given:  Yes    Krina Mraz 11/25/2018, 2:11 PM

## 2018-11-25 NOTE — Progress Notes (Signed)
Family Medicine Teaching Service Daily Progress Note Intern Pager: [phone]  Patient name: [name] Medical record number: [id number] Date of birth: 06-06-1981 Age: 38 y.o. 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 male presenting with abdominal pain concerning for bowel obstruction. PMH is significant for Bipolar disorder, HLD, substance abuse, beckers muscular dystrophy  Large bowel obstruction w/ possible colonic mass s/p colectomy: POD2 s/p exploratory laparotomy and partial colectomy (right colon and sigmoid colon) with ostomy and wound vac in place. Improvement in leukocytosis from 7.5>11.5>9.2. S/p broadening of antibiotics. Diferenital included natural response s/p surgery vs new development of multilobar PNA. Tachycardia stable in low 100's. No s/s of pneumonia. Pain managed well with scheduled tylenol and Toradol with morphine and oxy PRN. 7/10 pain this morning. Ambulated well to bedside chair, passing flatus, and tolerating liquid diet well. Has remained hemodynamically stable and afebrile overnight, currently on room air. Improvement in use of incentive spirometer. Hemoglobin stable. 10.9>10.1. CEA negative. No signs of infection on inspection of wound. - Continue Cefepime (2/5-) and Flagyl (2/3-) - surgery recs, will follow up -  Discontinue Toradol - Continue scheduled Tylenol with Oxy 5 mg for mod pain and Oxy 79m for severe pain/break through pain - k-pad - continuous cardiac monitoring, continuous pulse ox - D51/2NS at 567mhr  - incentive spirometry  - zofran PRN - diet per surgery - monitor fever curve, vitals, AM CBC - Nutrition consulted  Hypokalemia: resolved K 3.5 today - repleat as needed  Pre-diabetes:  A1C 6.4 (up from 6.3). H/o of allergy to metformin. - sSSI - monitor CBG's  HLD:  Home meds: Lipitor 4048mLast lipid panel: LDL 169 - Restart Lipitor 50m50mipolar disorder - Home meds: Latuda 80mg18m, Viibryd 50mg 68mLamtorigine 50mg Q26m med list but not taking. - continue latuda - continue viibryd  ADHD - patient takes vyvanse 50mg w/72makfast, 20mg at 39mh.  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 Home med: gabapentin 300mg QD -71mtinue gabapentin  GERD - patient takes 50mg prilo68mat home - IV Protonix, switch to PO when no longer NPO  Insomnia - Home meds: Trazodone 50mg qHS - 17minue home meds  Elevated Transaminases: improving AST: 43>72, ALT 35>58, alk phos WNL. Expect irritation from acute bowel.  - Consider further work up if worsens - continue to monitor with intermittent CMP's   FEN/GI: diet per surgery, replace electrolytes PRN, Protonix Prophylaxis: heparin subQ.   Disposition: pending improvement s/p surgery  Subjective:  Patient did well overnight. Endorses 7/10 pain, improving after getting a dose of Morphine. Less pain with deep breaths, has been using incentive spirometry. Tolerating liquid diet and admits to passing gas. Denies any concerns or complaints.  Objective: Temp:  [98.9 F (37.2 C)-99.4 F (37.4 C)] 99 F (37.2 C) (02/06 0515) Pulse Rate:  [99-106] 103 (02/06 0515) Resp:  [18-19] 19 (02/06 0515)  BP: (130-137)/(68-73) 134/70 (02/06 0515) SpO2:  [98 %-99 %] 99 % (02/06 0515) Physical  Exam: General: large, caucasian male, well nourished, well developed, in no acute distress with non-toxic appearance, sitting up in chair drinking coffee CV: regular rate and rhythm without murmurs, rubs, or gallops, no lower extremity edema, 2+ radial and pedal pulses bilaterally Lungs: Some intermittent wheezing on the right, good air movement on the left, normal work of breathing on room air Abdomen: soft, tender, large midline incision with vacuum seal in place, double ostomy bags with large amount of fluid in each  Skin: warm, dry Extremities: warm and well perfused, normal tone  Laboratory: Recent Labs  Lab 11/23/18 1425 11/24/18 0324 11/25/18 0321  WBC 7.5 11.5* 9.2  HGB 13.4 10.9* 10.1*  HCT 39.1 31.8* 29.6*  PLT 203 162 144*   Recent Labs  Lab 11/22/18 1634  11/23/18 0208 11/23/18 1425 11/24/18 0324 11/25/18 0321  NA  --    < > 135 133* 135 133*  K  --    < > 2.9* 3.8 3.3* 3.5  CL  --    < > 102 103 106 99  CO2  --    < > 26 20* 18* 24  BUN  --    < > _0 CREATININE  --    < > 1.02 1.34* 1.08 0.94  CALCIUM  --    < > 8.2* 7.5* 6.8* 8.1*  PROT 7.6  --  6.7  --  4.8*  --   BILITOT 0.9  --  1.0  --  1.1  --   ALKPHOS 67  --  77  --  43  --   ALT 35  --  58*  --  50*  --   AST 43*  --  72*  --  47*  --   GLUCOSE  --    < > 146* 210* 173* 140*   < > = values in this interval not displayed.   Hepatic Function Latest Ref Rng & Units 11/24/2018 11/23/2018 11/22/2018  Total Protein 6.5 - 8.1 g/dL 4.8(L) 6.7 7.6  Albumin 3.5 - 5.0 g/dL 2.2(L) 3.3(L) 3.9  AST 15 - 41 U/L 47(H) 72(H) 43(H)  ALT 0 - 44 U/L 50(H) 58(H) 35  Alk Phosphatase 38 - 126 U/L 43 77 67  Total Bilirubin 0.3 - 1.2 mg/dL 1.1 1.0 0.9  Bilirubin, Direct 0.0 - 0.2 mg/dL - - 0.3(H)   Lipase: 21 (nl) Troponin: <0.03 D-Dimer: 5.74 Lactic acid: 1.0>0.8 Mag: 1.5 A1C: 6.4 CEA: 2.6   Imaging/Diagnostic Tests: Dg Chest 2 View - Result Date: 11/22/2018 CLINICAL DATA:  Severe chest pain. EXAM: CHEST -  2 VIEW COMPARISON:  Chest x-ray dated 04/26/2007 and abdominal radiographs dated 11/19/2018 FINDINGS: The heart size and pulmonary vascularity are normal. There is slight atelectasis at the left lung base. No consolidative infiltrates or effusions. No bone abnormality. Slight distention of the colon. IMPRESSION: Slight atelectasis at the left lung base which may be due to a shallow inspiration. Increased gaseous distention of the colon since 11/19/2018. Electronically Signed   By: Lorriane Shire M.D.   On: 11/22/2018 16:39   Ct Angio Chest Pe W And/or Wo Contrast - Result Date: 11/22/2018 IMPRESSION: 1. No evidence of significant pulmonary embolus. 2. Linear atelectasis in the left lung. 3. Mosaic attenuation pattern to the lungs is probably due to motion artifact but could indicate air trapping or edema. 4. Segmental wall thickening of the  sigmoid colon with abrupt shouldered margins and stranding in the adjacent fat. Adjacent to the colon, there is an enhancing area of infiltration contiguous with the bladder wall along with bladder wall thickening. This could represent diverticulitis with fistula formation although the abrupt margins of the lesion are suspicious for colon cancer in which case this would be locally invasive. Surgical consultation is recommended. 5. Interval development of proximal colonic obstruction with gas and fluid levels. 6. Interval development of right colonic pneumatosis. This may be related to obstruction or could indicate developing bowel necrosis. 7. Small amount of free fluid in the abdomen and pelvis, progressing since previous study.   Ct Abdomen Pelvis W Contrast - Result Date: 11/22/2018 IMPRESSION: 1. No evidence of significant pulmonary embolus. 2. Linear atelectasis in the left lung. 3. Mosaic attenuation pattern to the lungs is probably due to motion artifact but could indicate air trapping or edema. 4. Segmental wall thickening of the sigmoid colon with abrupt shouldered  margins and stranding in the adjacent fat. Adjacent to the colon, there is an enhancing area of infiltration contiguous with the bladder wall along with bladder wall thickening. This could represent diverticulitis with fistula formation although the abrupt margins of the lesion are suspicious for colon cancer in which case this would be locally invasive. Surgical consultation is recommended. 5. Interval development of proximal colonic obstruction with gas and fluid levels. 6. Interval development of right colonic pneumatosis. This may be related to obstruction or could indicate developing bowel necrosis. 7. Small amount of free fluid in the abdomen and pelvis, progressing since previous study.   Dg Chest Port 1 View - Result Date: 11/24/2018 CLINICAL DATA:  Chest pain EXAM: PORTABLE CHEST 1 VIEW COMPARISON:  11/22/2018 FINDINGS: Bilateral interstitial thickening, left greater than right with a lower lobe predominance. No pleural effusion or pneumothorax. Stable cardiomediastinal silhouette. No acute osseous abnormality. IMPRESSION: Bilateral interstitial thickening, left greater than right with a lower lobe predominance. Differential considerations include mild pulmonary edema versus multilobar pneumonia. Electronically Signed   By: Kathreen Devoid   On: 11/24/2018 00:54   Dg Abd Acute 2+v W 1v Chest - Result Date: 11/23/2018 CLINICAL DATA:  Large bowel obstruction EXAM: DG ABDOMEN ACUTE W/ 1V CHEST COMPARISON:  Abdominal CT from yesterday FINDINGS: New large volume pneumoperitoneum. Pneumatosis is again seen along the distended proximal colon. No dilated small bowel. Low volume chest with atelectatic opacities.  Normal heart size. These results were called by telephone at the time of interpretation on 11/23/2018 at 6:59 am to Dr. Ky Barban, who verbally acknowledged these results. IMPRESSION: 1. Interval perforation with large volume pneumoperitoneum. 2. Colonic distension with ascending segment pneumatosis.  Electronically Signed   By: Monte Fantasia M.D.   On: 11/23/2018 07:00   Danna Hefty, DO 11/25/2018, 8:05 AM PGY-1, [place] Intern pager: [phone], text pages welcome

## 2018-11-25 NOTE — Care Management Note (Addendum)
Case Management Note  Patient Details  Name: [name] MRN: [id number] Date of Birth: Jul 12, 1981  Subjective/Objective:                    Action/Plan:  Spoke with patient again at bedside. Provided Medicare.gov home health list. Patient has no preference for home health agency.   [facility] unable to accept referral due to staffing.  Awaiting call back from [place] with [facility] unable to accept patient.  Cassie with [facility] unable to accept patient.   Awaiting call back from [place] with [facility]   . Malachy Mood with [facility] unable to accept referral.   Gay Filler with [facility] unable to accept referral.   Left message for Dorian Pod with [facility].  Hoyle Sauer with [facility] is unable to accept patient. Left message for [facility] with [facility]. Drew with [facility] unable to staff.   [facility] application faxed to [place] at [facility].  Expected Discharge Date:                  Expected Discharge Plan:  [place]  In-House Referral:     Discharge planning Services  CM Consult  Post Acute Care Choice:  Home Health Choice offered to:  Patient  DME Arranged:  Vac DME Agency:  [facility]  HH Arranged:  RN [place] Agency:     Status of Service:  In process, will continue to follow  If discussed at Long Length of Stay Meetings, dates discussed:    Additional Comments:  Marilu Favre, RN 11/25/2018, 2:25 PM

## 2018-11-25 NOTE — Progress Notes (Signed)
[facility] Progress Note  2 Days Post-Op  Subjective: CC: left sided abdominal pain Patient reports some left sided pain but reports it is mild. Tolerating FLD, denies nausea. Discussed smoking cessation.   Objective: Vital signs in last 24 hours: Temp:  [98.9 F (37.2 C)-99.4 F (37.4 C)] 99 F (37.2 C) (02/06 0515) Pulse Rate:  [99-106] 103 (02/06 0515) Resp:  [18-19] 19 (02/06 0515) BP: (130-137)/(68-73) 134/70 (02/06 0515) SpO2:  [98 %-99 %] 99 % (02/06 0515) Last BM Date: 11/24/18  Intake/Output from previous day: 02/05 0701 - 02/06 0700 In: 1641.3 [P.O.:1320; I.V.:151.9; IV Piggyback:169.5] Out: 1560 [Urine:450; Drains:560; Stool:550] Intake/Output this shift: No intake/output data recorded.  PE: Gen:  Alert, NAD, pleasant Card:  Regular rate and rhythm Pulm:  Normal effort Abd: Soft, appropriately tender, mildly distended, +BS, midline VAC with good seal, JP in LLQ with ss fluid, Colostomy pink with small amount brown stool, ileostomy red with small amount ss fluid Skin: warm and dry, no rashes    Lab Results:  Recent Labs    11/24/18 0324 11/25/18 0321  WBC 11.5* 9.2  HGB 10.9* 10.1*  HCT 31.8* 29.6*  PLT 162 144*   BMET Recent Labs    11/24/18 0324 11/25/18 0321  NA 135 133*  K 3.3* 3.5  CL 106 99  CO2 18* 24  GLUCOSE 173* 140*  BUN 15 11  CREATININE 1.08 0.94  CALCIUM 6.8* 8.1*   PT/INR No results for input(s): LABPROT, INR in the last 72 hours. CMP     Component Value Date/Time   NA 133 (L) 11/25/2018 0321   NA 139 10/04/2018 1520   K 3.5 11/25/2018 0321   CL 99 11/25/2018 0321   CO2 24 11/25/2018 0321   GLUCOSE 140 (H) 11/25/2018 0321   BUN 11 11/25/2018 0321   BUN 11 10/04/2018 1520   CREATININE 0.94 11/25/2018 0321   CREATININE 0.98 04/28/2016 1454   CALCIUM 8.1 (L) 11/25/2018 0321   PROT 4.8 (L) 11/24/2018 0324   PROT 7.4 10/04/2018 1520   ALBUMIN 2.2 (L) 11/24/2018 0324   ALBUMIN 4.5 10/04/2018 1520   AST 47  (H) 11/24/2018 0324   ALT 50 (H) 11/24/2018 0324   ALKPHOS 43 11/24/2018 0324   BILITOT 1.1 11/24/2018 0324   BILITOT <0.2 10/04/2018 1520   GFRNONAA >60 11/25/2018 0321   GFRNONAA >89 04/28/2016 1454   GFRAA >60 11/25/2018 0321   GFRAA >89 04/28/2016 1454   Lipase     Component Value Date/Time   LIPASE 21 11/22/2018 1634       Studies/Results: Dg Chest Port 1 View  Result Date: 11/24/2018 CLINICAL DATA:  Chest pain EXAM: PORTABLE CHEST 1 VIEW COMPARISON:  11/22/2018 FINDINGS: Bilateral interstitial thickening, left greater than right with a lower lobe predominance. No pleural effusion or pneumothorax. Stable cardiomediastinal silhouette. No acute osseous abnormality. IMPRESSION: Bilateral interstitial thickening, left greater than right with a lower lobe predominance. Differential considerations include mild pulmonary edema versus multilobar pneumonia. Electronically Signed   By: Kathreen Devoid   On: 11/24/2018 00:54    Anti-infectives: Anti-infectives (From admission, onward)   Start     Dose/Rate Route Frequency Ordered Stop   11/24/18 1400  ceFEPIme (MAXIPIME) 2 g in sodium chloride 0.9 % 100 mL IVPB     2 g 200 mL/hr over 30 Minutes Intravenous Every 8 hours 11/24/18 1219     11/23/18 2000  cefTRIAXone (ROCEPHIN) 2 g in sodium chloride 0.9 % 100 mL IVPB  Status:  Discontinued     2 g 200 mL/hr over 30 Minutes Intravenous Every 24 hours 11/23/18 1424 11/24/18 1216   11/23/18 1000  ciprofloxacin (CIPRO) IVPB 400 mg     400 mg 200 mL/hr over 60 Minutes Intravenous  Once 11/23/18 0953 11/23/18 1409   11/23/18 0600  aztreonam (AZACTAM) 2 g in sodium chloride 0.9 % 100 mL IVPB  Status:  Discontinued     2 g 200 mL/hr over 30 Minutes Intravenous Every 8 hours 11/22/18 2046 11/23/18 1424   11/22/18 2030  aztreonam (AZACTAM) 2 g in sodium chloride 0.9 % 100 mL IVPB     2 g 200 mL/hr over 30 Minutes Intravenous  Once 11/22/18 2027 11/22/18 2131   11/22/18 2030  metroNIDAZOLE  (FLAGYL) IVPB 500 mg     500 mg 100 mL/hr over 60 Minutes Intravenous Every 8 hours 11/22/18 2027         Assessment/Plan ?HCAP - per primary team Transaminitis - improving GERD HLD Bipolar disorder ADHD Insomnia Chronic back pain   Sigmoid stricture with perforated cecum S/P ex lap with resection of R colon and ileostomy, decompressive transverse colostomy, resection of sigmoid colon 11/23/18 Dr. Kieth Brightly - POD#2 - tolerating FLD - can advance to soft today - WOC consult for stomas and VAC - mobilize - d/c foley - continue JP - 560 cc of ss output in 24 hrs  FEN: advance to soft diet VTE: SCDs, SQ heparin ID: flagyl 2/3>>, cefepime 2/5>>  LOS: 3 days    Brigid Re , [facility] 11/25/2018, 8:40 AM Pager: [place]: [phone]

## 2018-11-26 DIAGNOSIS — K5792 Diverticulitis of intestine, part unspecified, without perforation or abscess without bleeding: Secondary | ICD-10-CM

## 2018-11-26 DIAGNOSIS — K5720 Diverticulitis of large intestine with perforation and abscess without bleeding: Principal | ICD-10-CM

## 2018-11-26 DIAGNOSIS — K9419 Other complications of enterostomy: Secondary | ICD-10-CM

## 2018-11-26 DIAGNOSIS — E876 Hypokalemia: Secondary | ICD-10-CM

## 2018-11-26 LAB — CBC
HCT: 29.4 % — ABNORMAL LOW (ref 39.0–52.0)
Hemoglobin: 10.2 g/dL — ABNORMAL LOW (ref 13.0–17.0)
MCH: 28.9 pg (ref 26.0–34.0)
MCHC: 34.7 g/dL (ref 30.0–36.0)
MCV: 83.3 fL (ref 80.0–100.0)
Platelets: 188 10*3/uL (ref 150–400)
RBC: 3.53 MIL/uL — ABNORMAL LOW (ref 4.22–5.81)
RDW: 13.5 % (ref 11.5–15.5)
WBC: 14.6 10*3/uL — ABNORMAL HIGH (ref 4.0–10.5)
nRBC: 0 % (ref 0.0–0.2)

## 2018-11-26 LAB — COMPREHENSIVE METABOLIC PANEL
ALT: 35 U/L (ref 0–44)
AST: 37 U/L (ref 15–41)
Albumin: 2 g/dL — ABNORMAL LOW (ref 3.5–5.0)
Alkaline Phosphatase: 61 U/L (ref 38–126)
Anion gap: 9 (ref 5–15)
BUN: 9 mg/dL (ref 6–20)
CO2: 26 mmol/L (ref 22–32)
Calcium: 7.8 mg/dL — ABNORMAL LOW (ref 8.9–10.3)
Chloride: 99 mmol/L (ref 98–111)
Creatinine, Ser: 0.94 mg/dL (ref 0.61–1.24)
GFR calc Af Amer: >60 mL/min (ref >60)
GFR calc non Af Amer: >60 mL/min (ref >60)
Glucose, Bld: 136 mg/dL — ABNORMAL HIGH (ref 70–99)
Potassium: 3.5 mmol/L (ref 3.5–5.1)
Sodium: 134 mmol/L — ABNORMAL LOW (ref 135–145)
Total Bilirubin: 1.1 mg/dL (ref 0.3–1.2)
Total Protein: 5.3 g/dL — ABNORMAL LOW (ref 6.5–8.1)

## 2018-11-26 MED ORDER — METRONIDAZOLE 500 MG PO TABS
500.0000 mg | ORAL_TABLET | Freq: Three times a day (TID) | ORAL | Status: DC
  Administered 2018-11-26 – 2018-11-30 (×13): 500 mg via ORAL
  Filled 2018-11-26 (×13): qty 1

## 2018-11-26 MED ORDER — CEFDINIR 300 MG PO CAPS
300.0000 mg | ORAL_CAPSULE | Freq: Two times a day (BID) | ORAL | Status: DC
  Administered 2018-11-26 – 2018-11-30 (×9): 300 mg via ORAL
  Filled 2018-11-26 (×10): qty 1

## 2018-11-26 MED ORDER — OXYCODONE HCL 5 MG PO TABS
5.0000 mg | ORAL_TABLET | ORAL | Status: DC | PRN
  Administered 2018-11-26 – 2018-11-27 (×3): 5 mg via ORAL
  Filled 2018-11-26 (×3): qty 1

## 2018-11-26 MED ORDER — PANTOPRAZOLE SODIUM 40 MG PO TBEC
40.0000 mg | DELAYED_RELEASE_TABLET | Freq: Every day | ORAL | Status: DC
  Administered 2018-11-26 – 2018-11-29 (×4): 40 mg via ORAL
  Filled 2018-11-26 (×4): qty 1

## 2018-11-26 MED ORDER — LOPERAMIDE HCL 2 MG PO CAPS
4.0000 mg | ORAL_CAPSULE | Freq: Two times a day (BID) | ORAL | Status: AC
  Administered 2018-11-26 – 2018-11-27 (×4): 4 mg via ORAL
  Filled 2018-11-26 (×4): qty 2

## 2018-11-26 MED ORDER — ADULT MULTIVITAMIN W/MINERALS CH
1.0000 | ORAL_TABLET | Freq: Every day | ORAL | Status: DC
  Administered 2018-11-26 – 2018-11-30 (×5): 1 via ORAL
  Filled 2018-11-26 (×5): qty 1

## 2018-11-26 MED ORDER — OXYCODONE HCL 5 MG PO TABS: 7.5000 mg | ORAL_TABLET | ORAL | Status: DC | PRN

## 2018-11-26 MED ORDER — ONDANSETRON HCL 4 MG PO TABS
4.0000 mg | ORAL_TABLET | Freq: Once | ORAL | Status: DC
  Filled 2018-11-26 (×2): qty 1

## 2018-11-26 NOTE — Progress Notes (Signed)
Initial Nutrition Assessment  DOCUMENTATION CODES:   Obesity unspecified  INTERVENTION:   -Continue Ensure Enlive po BID, each supplement provides 350 kcal and 20 grams of protein -MVI with minerals daily  NUTRITION DIAGNOSIS:   Increased nutrient needs related to post-op healing, wound healing as evidenced by estimated needs.  GOAL:   Patient will meet greater than or equal to 90% of their needs  MONITOR:   PO intake, Supplement acceptance, Labs, Weight trends, Skin, I & O's  REASON FOR ASSESSMENT:   Malnutrition Screening Tool, Consult Assessment of nutrition requirement/status  ASSESSMENT:   [name] is a 38 y.o. male presenting with abdominal pain concerning for bowel obstruction. PMH is significant for Bipolar disorder, HLD, substance abuse, beckers muscular dystrophy,   Pt admitted with sigmoid stricture and perforated cecum.   2/7- s/p Procedure: exploratory laparotomy, resection of right colon with end ileostomy, decompressive transverse colostomy, resection of sigmoid colon, placement of 120cm^2 wound vac placement  Reviewed I/O's: -887 ml x 24 hours and +2.4 L since admission  Drains: 225 ml x 24 hours  Spoke with pt and multiple family members at bedside. Pt was sleepy at time of visit and most of history was obtained from pt wife. Pt had a great appetite PTA and typically consumed 2 meals per day (favorite food is pizza). He consumes a lot of energy drinks at home, which he prefers over Ensure supplements. Pt did not follow a special diet and consumed whatever her preferred, usually fast food or convenience food. Pt reports her slept through breakfast this morning but is looking forward to his cheeseburger for lunch. Yesterday, pt received two trays at breakfast, of which he both consumed 50%.   Pt denies any weight loss PTA. Pt wife estimates he has lost approximately 3 pounds within the past week. Reviewed wt hx. Noted pt lost 2.3% wt in the past 2 weeks,  which is not significant for time frame.   Discussed with pt and family importance of good meal and supplement intake to promote healing. Discussed importance of supplement to help meet protein needs to assist with wound healing. Pt amenable to continue Ensure supplements.   Case discussed with RN, who reports pt is consuming Ensure supplements when encouraged.   Albumin has a half-life of 21 days and is strongly affected by stress response and inflammatory process, therefore, do not expect to see an improvement in this lab value during acute hospitalization. When a patient presents with low albumin, it is likely skewed due to the acute inflammatory response.  Unless it is suspected that patient had poor PO intake or malnutrition prior to admission, then RD should not be consulted solely for low albumin. Note that low albumin is no longer used to diagnose malnutrition; [place] uses the new malnutrition guidelines published by the American Society for Parenteral and Enteral Nutrition (A.S.P.E.N.) and the Academy of Nutrition and Dietetics (AND).    Labs reviewed: Na: 134.   NUTRITION - FOCUSED PHYSICAL EXAM:    Most Recent Value  Orbital Region  No depletion  Upper Arm Region  No depletion  Thoracic and Lumbar Region  No depletion  Buccal Region  No depletion  Temple Region  No depletion  Clavicle Bone Region  No depletion  Clavicle and Acromion Bone Region  No depletion  Scapular Bone Region  No depletion  Dorsal Hand  No depletion  Patellar Region  No depletion  Anterior Thigh Region  No depletion  Posterior Calf Region  No depletion  Edema (RD Assessment)  None  Hair  Reviewed  Eyes  Reviewed  Mouth  Reviewed  Skin  Reviewed  Nails  Reviewed       Diet Order:   Diet Order            Diet Carb Modified Fluid consistency: Thin; Room service appropriate? Yes  Diet effective now              EDUCATION NEEDS:   Education needs have been addressed  Skin:  Skin  Assessment: Skin Integrity Issues: Skin Integrity Issues:: Wound VAC Wound Vac: abdomen  Last BM:  11/26/18 (100 ml output via ilesotomy)  Height:   Ht Readings from Last 1 Encounters:  11/22/18 $RemoveB'5\' 8"'ssDuJhmD$  (1.727 m)    Weight:   Wt Readings from Last 1 Encounters:  11/22/18 112.5 kg    Ideal Body Weight:  70 kg  BMI:  Body mass index is 37.71 kg/m.  Estimated Nutritional Needs:   Kcal:  2100-2300  Protein:  120-140 grams  Fluid:  > 2.1 L    Judee Hennick A. Jimmye Norman, RD, LDN, CDE Pager: [phone] After hours Pager: [phone]

## 2018-11-26 NOTE — Progress Notes (Signed)
Pt. complaining of nausea but it is too early for Zofran. Paged MD but received no new orders. Will endorse to the oncoming nurse.

## 2018-11-26 NOTE — Evaluation (Signed)
Physical Therapy Evaluation Patient Details Name: [name] MRN: [id number] DOB: Sep 01, 1981 Today's Date: 11/26/2018   History of Present Illness  38 y.o. male presenting with abdominal pain concerning for bowel obstruction. 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 Kieth Brightly  Clinical Impression  PTA pt independent with mobility and ADLs working as a Adult nurse. Pt currently limited in safe mobility by increased lethargy suspected due to increased pain medications resulting in decreased balance and coordination. Pt requires mod I for bed mobility, min guard for transfers and minA for ambulation with RW due to 2x LoB requiring assist for steadying. Pt able to ambulate with mobility tech earlier increased distance without AD. Pt has 15 steps in his apartment to get to bed/bathroom, unable to attempt today due to decreased balance and coordination. PT recommending HHPT level rehab for improved mobility at home if effects of medication can be controlled to insure safety with mobility. PT will continue to follow acutely.     Follow Up Recommendations Home health PT    Equipment Recommendations  3in1 (PT);Rolling walker with 5" wheels    Recommendations for Other Services       Precautions / Restrictions Precautions Precautions: None Precaution Comments: colostomy, JP drain and wound vac Restrictions Weight Bearing Restrictions: No      Mobility  Bed Mobility Overal bed mobility: Modified  Independent(+ bed rails)                Transfers Overall transfer level: Needs assistance Equipment used: Rolling walker (2 wheeled) Transfers: Sit to/from Stand Sit to Stand: Min guard(for dizziness)         General transfer comment: Pt requires minguard for standing balance  Ambulation/Gait Ambulation/Gait assistance: Min guard;Min assist Gait Distance (Feet): 80 Feet Assistive device: Rolling walker (2 wheeled) Gait Pattern/deviations: Step-through pattern;Decreased step length - right;Decreased step length - left;Narrow base of support;Trunk flexed Gait velocity: slowed Gait velocity interpretation: <1.31 ft/sec, indicative of household ambulator General Gait Details: min guard for ambulation with minA required for 2x LoB, vc for wider BoS, proximity to RW and upright posture      Balance Overall balance assessment: Mild deficits observed, not formally tested;Needs assistance Sitting-balance support: Feet supported;No upper extremity supported Sitting balance-Leahy Scale: Fair     Standing balance support: Bilateral upper extremity supported Standing balance-Leahy Scale: Poor Standing balance comment: requires UE support                             Pertinent Vitals/Pain Pain Assessment: 0-10 Pain Score: 7  Pain Location: abdomen Pain Descriptors / Indicators: Discomfort Pain Intervention(s): Limited activity within patient's tolerance;Monitored during session;Premedicated before session;Repositioned    Home Living Family/patient expects to be discharged to:: Private residence Living Arrangements: Spouse/significant other Available Help at Discharge: Family Type of Home: Apartment Home Access: Level entry     Home Layout: Two level;Bed/bath upstairs(no bathroom downstairs) Home Equipment: None      Prior Function Level of Independence: Independent         Comments: working  Hand Dominance   Dominant Hand: Right     Extremity/Trunk Assessment   Upper Extremity Assessment Upper Extremity Assessment: Overall WFL for tasks assessed    Lower Extremity Assessment Lower Extremity Assessment: Overall WFL for tasks assessed    Cervical / Trunk Assessment Cervical / Trunk Assessment: Normal  Communication   Communication: Other (comment)(very groggy, required repeated questioning)  Cognition Arousal/Alertness: Lethargic Behavior During Therapy: Flat affect;WFL for tasks assessed/performed Overall Cognitive Status: Within Functional Limits for tasks assessed                                 General Comments: very country accent- mumbles      General Comments General comments (skin integrity, edema, etc.): Pt with increased difficulty with focus and following commands, suspect due to increased medication. Pt wife and family present throughout session.        Assessment/Plan    PT Assessment Patient needs continued PT services  PT Problem List Decreased activity tolerance;Decreased balance;Decreased mobility;Decreased safety awareness;Pain;Decreased coordination       PT Treatment Interventions DME instruction;Gait training;Stair training;Functional mobility training;Therapeutic activities;Therapeutic exercise;Balance training;Patient/family education;Cognitive remediation    PT Goals (Current goals can be found in the Care Plan section)  Acute Rehab PT Goals Patient Stated Goal: to feel better PT Goal Formulation: With patient Time For Goal Achievement: 12/10/18 Potential to Achieve Goals: Fair    Frequency Min 3X/week    AM-PAC PT "6 Clicks" Mobility  Outcome Measure Help needed turning from your back to your side while in a flat bed without using bedrails?: A Little Help needed moving from lying on your back to sitting on the side of a flat bed without using bedrails?: A Little Help needed moving to and from a bed to a chair (including a wheelchair)?: A Little Help needed  standing up from a chair using your arms (e.g., wheelchair or bedside chair)?: A Little Help needed to walk in hospital room?: A Little Help needed climbing 3-5 steps with a railing? : A Lot 6 Click Score: 17    End of Session Equipment Utilized During Treatment: Gait belt Activity Tolerance: Patient limited by lethargy Patient left: in bed;with call bell/phone within reach;with family/visitor present(seated EoB eating lunch) Nurse Communication: Mobility status PT Visit Diagnosis: Unsteadiness on feet (R26.81);Other abnormalities of gait and mobility (R26.89);Muscle weakness (generalized) (M62.81);Difficulty in walking, not elsewhere classified (R26.2);Dizziness and giddiness (R42);Pain Pain - part of body: (abdomen)    Time: 1937-9024 PT Time Calculation (min) (ACUTE ONLY): 17 min   Charges:   PT Evaluation $PT Eval Moderate Complexity: 1 Mod          Obe Ahlers B. Migdalia Dk PT, DPT Acute Rehabilitation Services Pager [phone] Office [phone]   [place] 11/26/2018, 1:55 PM

## 2018-11-26 NOTE — Progress Notes (Signed)
[facility]/Trauma Progress Note  3 Days Post-Op   Assessment/Plan ?HCAP - per primary team Transaminitis - improving GERD HLD Bipolar disorder ADHD Insomnia Chronic back pain   Sigmoid stricture with perforated cecum S/P ex lap with resection of R colon and ileostomy, decompressive transverse colostomy, resection of sigmoid colon 11/23/18 Dr. Kieth Brightly - mobilize - pathology showed diverticulitis  - continue JP - 225 cc of ss output in 24 hrs  FEN: soft diet VTE: SCDs, SQ heparin ID: flagyl 2/3>>, cefepime 2/5>>   WBC up to 14.6, afebrile Follow up: Dr. Kieth Brightly  Dispo: Monitor WBC. Pt will need 10-14 days total of antibiotics.    LOS: 4 days    Subjective: CC: mild abdominal pain  Pt is tolerating a diet and having bowel function. Wife at bedside. No issues overnight. Pt denies nausea, vomiting, fever or chills.   Objective: Vital signs in last 24 hours: Temp:  [97.6 F (36.4 C)-98.5 F (36.9 C)] 98.5 F (36.9 C) (02/07 0300) Pulse Rate:  [96-109] 96 (02/07 0300) Resp:  [16-18] 18 (02/07 0300) BP: (101-144)/(62-74) 126/67 (02/07 0300) SpO2:  [92 %-98 %] 92 % (02/07 0300) Last BM Date: 11/25/18  Intake/Output from previous day: 02/06 0701 - 02/07 0700 In: 2687.2 [P.O.:760; I.V.:1096.6; IV Piggyback:830.6] Out: 1610 [Urine:1175; Drains:225; RUEAV:4098] Intake/Output this shift: No intake/output data recorded.  PE: Gen:  Alert, NAD, pleasant, cooperative Pulm: rate and effort normal Abd: Soft, ND, +BS, midline VAC with good seal, JP in LLQ with ss fluid, Colostomy pink with small amount brown stool, ileostomy red with brown stool, mild generalized TTP without guarding. No peritonitis  Skin: no rashes noted, warm and dry   Anti-infectives: Anti-infectives (From admission, onward)   Start     Dose/Rate Route Frequency Ordered Stop   11/24/18 1400  ceFEPIme (MAXIPIME) 2 g in sodium chloride 0.9 % 100 mL IVPB     2 g 200 mL/hr over 30  Minutes Intravenous Every 8 hours 11/24/18 1219     11/23/18 2000  cefTRIAXone (ROCEPHIN) 2 g in sodium chloride 0.9 % 100 mL IVPB  Status:  Discontinued     2 g 200 mL/hr over 30 Minutes Intravenous Every 24 hours 11/23/18 1424 11/24/18 1216   11/23/18 1000  ciprofloxacin (CIPRO) IVPB 400 mg     400 mg 200 mL/hr over 60 Minutes Intravenous  Once 11/23/18 0953 11/23/18 1409   11/23/18 0600  aztreonam (AZACTAM) 2 g in sodium chloride 0.9 % 100 mL IVPB  Status:  Discontinued     2 g 200 mL/hr over 30 Minutes Intravenous Every 8 hours 11/22/18 2046 11/23/18 1424   11/22/18 2030  aztreonam (AZACTAM) 2 g in sodium chloride 0.9 % 100 mL IVPB     2 g 200 mL/hr over 30 Minutes Intravenous  Once 11/22/18 2027 11/22/18 2131   11/22/18 2030  metroNIDAZOLE (FLAGYL) IVPB 500 mg     500 mg 100 mL/hr over 60 Minutes Intravenous Every 8 hours 11/22/18 2027        Lab Results:  Recent Labs    11/25/18 0321 11/26/18 0549  WBC 9.2 14.6*  HGB 10.1* 10.2*  HCT 29.6* 29.4*  PLT 144* 188   BMET Recent Labs    11/25/18 0321 11/26/18 0549  NA 133* 134*  K 3.5 3.5  CL 99 99  CO2 24 26  GLUCOSE 140* 136*  BUN 11 9  CREATININE 0.94 0.94  CALCIUM 8.1* 7.8*   PT/INR No results for input(s): LABPROT, INR  in the last 72 hours. CMP     Component Value Date/Time   NA 134 (L) 11/26/2018 0549   NA 139 10/04/2018 1520   K 3.5 11/26/2018 0549   CL 99 11/26/2018 0549   CO2 26 11/26/2018 0549   GLUCOSE 136 (H) 11/26/2018 0549   BUN 9 11/26/2018 0549   BUN 11 10/04/2018 1520   CREATININE 0.94 11/26/2018 0549   CREATININE 0.98 04/28/2016 1454   CALCIUM 7.8 (L) 11/26/2018 0549   PROT 5.3 (L) 11/26/2018 0549   PROT 7.4 10/04/2018 1520   ALBUMIN 2.0 (L) 11/26/2018 0549   ALBUMIN 4.5 10/04/2018 1520   AST 37 11/26/2018 0549   ALT 35 11/26/2018 0549   ALKPHOS 61 11/26/2018 0549   BILITOT 1.1 11/26/2018 0549   BILITOT <0.2 10/04/2018 1520   GFRNONAA >60 11/26/2018 0549   GFRNONAA >89 04/28/2016  1454   GFRAA >60 11/26/2018 0549   GFRAA >89 04/28/2016 1454   Lipase     Component Value Date/Time   LIPASE 21 11/22/2018 1634    Studies/Results: No results found.    Kalman Drape , [facility] 11/26/2018, 8:18 AM  Pager: [phone] Mon-Wed, Friday 7:00am-4:30pm Thurs 7am-11:30am  Consults: [phone]

## 2018-11-26 NOTE — Care Management Note (Signed)
Case Management Note  Patient Details  Name: [name] MRN: [id number] Date of Birth: [birth year]  Subjective/Objective:                    Action/Plan:  Called multiple home health agencies. All unable to accept referral. Janett Billow PA with [facility] approved using negative pressure system with [facility] . Same ordered through [facility]. [facility] will provide HHRN.  Cancelled [facility] with [facility].  Expected Discharge Date:                  Expected Discharge Plan:  [place]  In-House Referral:     Discharge planning Services  CM Consult  Post Acute Care Choice:  Home Health Choice offered to:  Patient  DME Arranged:  Vac DME Agency:  [place] Arranged:  RN [facility] Agency:  [place]  Status of Service:  In process, will continue to follow  If discussed at Long Length of Stay Meetings, dates discussed:    Additional Comments:  Victor Favre, RN 11/26/2018, 8:33 AM

## 2018-11-26 NOTE — Evaluation (Signed)
Occupational Therapy Evaluation Patient Details Name: [name] MRN: [id number] DOB: 03-01-1981 Today's Date: 11/26/2018    History of Present Illness 38 y.o. male presenting with abdominal pain concerning for bowel obstruction. 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 Kieth Brightly   Clinical Impression   Pt is a 38 yo male s/p above dx. PTA: lives with family available 24/7. Pt was working and reports independence with ADL. Pt currently limited by pain in abdomen and dizziness in standing that causes minguardA for steadiness. Pt education on figure 4 technique for ADL and would benefit from LB AE teaching next session. Pt performing bed mobility and transfers with supervision. Pt set-upA for UB ADL and minA for LB ADL. Pt refused to use RW, but pt would be more steady with RW due to pain and weakness. Pt would benefit from continued OT skilled services for ADL, mobility and safety in acute setting.     Follow Up Recommendations  No OT follow up;Supervision - Intermittent    Equipment Recommendations  3 in 1 bedside commode    Recommendations for Other Services       Precautions / Restrictions Precautions Precautions: None Precaution Comments: colostomy and woundv vac Restrictions Weight Bearing Restrictions: No      Mobility Bed Mobility Overal bed mobility: Modified Independent(+ bed rails)                Transfers Overall transfer level: Needs assistance   Transfers: Sit to/from  Stand Sit to Stand: Min guard(for dizziness)         General transfer comment: Pt requires minguard for initial standing balance    Balance Overall balance assessment: Mild deficits observed, not formally tested                                         ADL either performed or assessed with clinical judgement   ADL Overall ADL's : Needs assistance/impaired Eating/Feeding: Modified independent   Grooming: Supervision/safety;Standing   Upper Body Bathing: Supervision/ safety;Standing   Lower Body Bathing: Minimal assistance   Upper Body Dressing : Supervision/safety;Standing   Lower Body Dressing: Minimal assistance       Toileting- Clothing Manipulation and Hygiene: Set up;Sit to/from stand       Functional mobility during ADLs: Min guard(dizziness, subsides) General ADL Comments: Set-up to supervisionA for ADL; able to do figure 4 technique for LB with some discomfort.  pt at his functional level.     Vision Baseline Vision/History: No visual deficits Vision Assessment?: No apparent visual deficits     Perception     Praxis      Pertinent Vitals/Pain Pain Assessment: 0-10 Pain Score: 7  Pain Descriptors / Indicators: Discomfort     Hand Dominance Right   Extremity/Trunk Assessment Upper Extremity Assessment Upper Extremity Assessment: Overall WFL for tasks assessed   Lower Extremity Assessment Lower Extremity Assessment: Overall WFL for tasks assessed   Cervical / Trunk Assessment Cervical / Trunk Assessment: Normal   Communication Communication Communication: No  difficulties(dozing off)   Cognition Arousal/Alertness: Lethargic Behavior During Therapy: Flat affect;WFL for tasks assessed/performed Overall Cognitive Status: Within Functional Limits for tasks assessed                                 General Comments: very country accent- mumbles   General Comments       Exercises     Shoulder Instructions       Home Living Family/patient expects to be discharged to:: Private residence Living Arrangements: Spouse/significant other Available Help at Discharge: Family Type of Home: Apartment Home Access: Level entry     Home Layout: Two level Alternate Level Stairs-Number of Steps: 15 Alternate Level Stairs-Rails: Can reach both Bathroom Shower/Tub: Teacher, early years/pre: Standard     Home Equipment: None          Prior Functioning/Environment Level of Independence: Independent        Comments: working        OT Problem List: Impaired balance (sitting and/or standing)      OT Treatment/Interventions:      OT Goals(Current goals can be found in the care plan section) Acute Rehab OT Goals Patient Stated Goal: to feel better OT Goal Formulation: With patient Time For Goal Achievement: 12/10/18 Potential to Achieve Goals: Fair ADL Goals Pt Will Perform Lower Body Dressing: with set-up;with adaptive equipment;sitting/lateral leans;sit to/from stand Additional ADL Goal #1: Pt will increase to 10 mins of functional task in standing at sink contiunuously with increased alertness and no dizziness reported.  OT Frequency:     Barriers to D/C:            Co-evaluation              AM-PAC OT "6 Clicks" Daily Activity     Outcome Measure Help from another person eating meals?: None Help from another person taking care of personal grooming?: None Help from another person toileting, which includes using toliet, bedpan, or urinal?: A Little Help from another person bathing (including washing, rinsing, drying)?: A Little Help from another person to put on and taking off regular upper body clothing?: None Help from another person to put on and taking off regular lower body clothing?: A Little 6 Click Score: 21   End of Session Nurse Communication: Mobility status  Activity Tolerance: Patient limited by lethargy;Patient tolerated treatment well Patient left:  in bed;with call bell/phone within reach;with family/visitor present  OT Visit Diagnosis: Unsteadiness on feet (R26.81);Muscle weakness (generalized) (M62.81)                Time: 4259-5638 OT Time Calculation (min): 20 min Charges:  OT General Charges $OT Visit: 1 Visit OT Evaluation $OT Eval Moderate Complexity: 1 Mod  Darryl Nestle) Marsa Aris OTR/L Acute Rehabilitation Services Pager: [phone] Office: [phone]  Fredda Hammed 11/26/2018, 11:16 AM

## 2018-11-26 NOTE — Progress Notes (Addendum)
Family Medicine Teaching Service Daily Progress Note Intern Pager: [phone]  Patient name: [name] Medical record number: [id number] Date of birth: 1981/10/07 Age: 38 y.o. 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 male presenting with abdominal pain concerning for bowel obstruction. PMH is significant for Bipolar disorder, HLD, substance abuse, beckers muscular dystrophy  Large bowel obstruction w/ perforation s/p colectomy: POD3 s/p exploratory laparotomy and partial colectomy (right colon and sigmoid colon) with ileostomy/transverse colostomy and wound vac in place. Leukocytosis worsened 7.5>11.5>9.2>14.6. S/p broadening of antibiotics. Has remained hemodynamically stable and afebrile overnight on room air. Heart rate improved to 90's ON. Hemoglobin stable. No signs of infection on inspection of wound. Pain managed well per patient on scheduled tylenol and Oxy PRN (required 3 doses of '10mg'$  Oxy ON). Pain this AM is 7/10. Ambulating well, tolerating PO, and passing flatus. Using incentive spirometry as requested. Appears more groggy on exam this morning, although A&Ox3. Do not feel patient has PNA, thus spoke to surgery and agreed on PO Flagyl and Cefdinir on a GI standpoint. >3L of stool output yesterday.  - Transition from  Cefepime (2/5-2/7) to Cefdinir (2/7-2/16) - Transition to PO Flagyl (2/3-2/16) - Appreciate surgery recs - antibiotics for 14 days total - Begin Imodium  - Continue scheduled Tylenol - Decrease to Oxy 5 and 7.'5mg'$  for moderate to severe pain/break through pain - k-pad - continuous cardiac monitoring, continuous pulse ox - discontinue fluids  - incentive spirometry q1 hr - zofran PRN - monitor fever curve, vitals, AM CBC - Nutrition consulted - Monitor Mag, Phos in AM labs  Hypokalemia: resolved K stable today at 3.5 - replete as needed  Pre-diabetes:  A1C 6.4 (up from 6.3). H/o of allergy to metformin. CBGs 130-200 - sSSI - monitor CBG's  HLD:  Home meds: Lipitor '40mg'$ . Last lipid panel: LDL 169 - Continue Lipitor '40mg'$   Bipolar disorder - Home meds: Latuda '80mg'$  qHS, Viibryd '40mg'$  QD. Lamtorigine '50mg'$  QD on med list but not taking. - continue latuda - continue viibryd  ADHD - patient takes vyvanse '50mg'$  w/ breakfast, '20mg'$  at lunch.  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 Home med: gabapentin '300mg'$  QD - continue gabapentin  GERD - patient takes '40mg'$  prilosec at home - IV Protonix, switch to PO when no longer NPO  Insomnia - Home meds: Trazodone '50mg'$  qHS - continue home meds  Elevated Transaminases: Resolved AST: 43>72>37, ALT 35>58>35, alk phos WNL.    FEN/GI: Reg diet, replace electrolytes PRN, Protonix PO Prophylaxis: heparin subQ.   Disposition: pending improvement s/p surgery  Subjective:  Patient reports feeling well overnight. Endorses 7/10 pain today but is satisfied with current pain regimen. Little harder time using incentive spirometry this morning due to pain. Tolerating PO regular diet and ambulating well. Some nausea this morning. , improving after getting a dose of Morphine. Denies any concerns  or complaints.  Objective: Temp:  [97.6 F (36.4 C)-98.5 F (36.9 C)] 98.5 F (36.9 C) (02/07 0300) Pulse Rate:   [96-109] 96 (02/07 0300) Resp:  [16-18] 18 (02/07 0300) BP: (101-144)/(62-74) 126/67 (02/07 0300) SpO2:  [92 %-98 %] 92 % (02/07 0300) Physical Exam: General: large, caucasian male, well nourished, well developed, in no acute distress with non-toxic appearance, awake sitting up in bed but appears more groggy CV: regular rate and rhythm without murmurs, rubs, or gallops, no lower extremity edema, 2+ radial and pedal pulses bilaterally Lungs: lungs clear to ausculation with some scattered wheezes, normal work of breathing Abdomen: soft, tender, large midline incision with vacuum seal in place, double ostomy bags with minimal amount of fluid in each  Skin: warm, dry Extremities: warm and well perfused, normal tone  Laboratory: Recent Labs  Lab 11/24/18 0324 11/25/18 0321 11/26/18 0549  WBC 11.5* 9.2 14.6*  HGB 10.9* 10.1* 10.2*  HCT 31.8* 29.6* 29.4*  PLT 162 144* 188   Recent Labs  Lab 11/23/18 0208  11/24/18 0324 11/25/18 0321 11/26/18 0549  NA 135   < > 135 133* 134*  K 2.9*   < > 3.3* 3.5 3.5  CL 102   < > 106 99 99  CO2 26   < > 18* 24 26  BUN 11   < > '15 11 9  '$ CREATININE 1.02   < > 1.08 0.94 0.94  CALCIUM 8.2*   < > 6.8* 8.1* 7.8*  PROT 6.7  --  4.8*  --  5.3*  BILITOT 1.0  --  1.1  --  1.1  ALKPHOS 77  --  43  --  61  ALT 58*  --  50*  --  35  AST 72*  --  47*  --  37  GLUCOSE 146*   < > 173* 140* 136*   < > = values in this interval not displayed.   Hepatic Function Latest Ref Rng & Units 11/26/2018 11/24/2018 11/23/2018  Total Protein 6.5 - 8.1 g/dL 5.3(L) 4.8(L) 6.7  Albumin 3.5 - 5.0 g/dL 2.0(L) 2.2(L) 3.3(L)  AST 15 - 41 U/L 37 47(H) 72(H)  ALT 0 - 44 U/L 35 50(H) 58(H)  Alk Phosphatase 38 - 126 U/L 61 43 77  Total Bilirubin 0.3 - 1.2 mg/dL 1.1 1.1 1.0  Bilirubin, Direct 0.0 - 0.2 mg/dL - - -   Lipase: 21 (nl) Troponin: <0.03 D-Dimer: 5.74 Lactic acid: 1.0>0.8 Mag: 1.5 A1C: 6.4 CEA: 2.6   Imaging/Diagnostic Tests: Dg Chest 2 View - Result Date:  11/22/2018 CLINICAL DATA:  Severe chest pain. EXAM: CHEST - 2 VIEW COMPARISON:  Chest x-ray dated 04/26/2007 and abdominal radiographs dated 11/19/2018 FINDINGS: The heart size and pulmonary vascularity are normal. There is slight atelectasis at the left lung base. No consolidative infiltrates or effusions. No bone abnormality. Slight distention of the colon. IMPRESSION: Slight atelectasis at the left lung base which may be due to a shallow inspiration. Increased gaseous distention of the colon since 11/19/2018. Electronically Signed   By: Lorriane Shire M.D.   On: 11/22/2018 16:39   Ct Angio Chest Pe W And/or Wo Contrast - Result Date: 11/22/2018 IMPRESSION: 1. No evidence of significant pulmonary embolus. 2. Linear atelectasis in the left lung. 3. Mosaic attenuation pattern to the lungs is probably due to motion artifact but could indicate air trapping or edema. 4. Segmental wall thickening of the sigmoid colon with abrupt shouldered margins and stranding in the adjacent fat. Adjacent  to the colon, there is an enhancing area of infiltration contiguous with the bladder wall along with bladder wall thickening. This could represent diverticulitis with fistula formation although the abrupt margins of the lesion are suspicious for colon cancer in which case this would be locally invasive. Surgical consultation is recommended. 5. Interval development of proximal colonic obstruction with gas and fluid levels. 6. Interval development of right colonic pneumatosis. This may be related to obstruction or could indicate developing bowel necrosis. 7. Small amount of free fluid in the abdomen and pelvis, progressing since previous study.   Ct Abdomen Pelvis W Contrast - Result Date: 11/22/2018 IMPRESSION: 1. No evidence of significant pulmonary embolus. 2. Linear atelectasis in the left lung. 3. Mosaic attenuation pattern to the lungs is probably due to motion artifact but could indicate air trapping or edema. 4. Segmental wall  thickening of the sigmoid colon with abrupt shouldered margins and stranding in the adjacent fat. Adjacent to the colon, there is an enhancing area of infiltration contiguous with the bladder wall along with bladder wall thickening. This could represent diverticulitis with fistula formation although the abrupt margins of the lesion are suspicious for colon cancer in which case this would be locally invasive. Surgical consultation is recommended. 5. Interval development of proximal colonic obstruction with gas and fluid levels. 6. Interval development of right colonic pneumatosis. This may be related to obstruction or could indicate developing bowel necrosis. 7. Small amount of free fluid in the abdomen and pelvis, progressing since previous study.   Dg Chest Port 1 View - Result Date: 11/24/2018 CLINICAL DATA:  Chest pain EXAM: PORTABLE CHEST 1 VIEW COMPARISON:  11/22/2018 FINDINGS: Bilateral interstitial thickening, left greater than right with a lower lobe predominance. No pleural effusion or pneumothorax. Stable cardiomediastinal silhouette. No acute osseous abnormality. IMPRESSION: Bilateral interstitial thickening, left greater than right with a lower lobe predominance. Differential considerations include mild pulmonary edema versus multilobar pneumonia. Electronically Signed   By: Kathreen Devoid   On: 11/24/2018 00:54   Dg Abd Acute 2+v W 1v Chest - Result Date: 11/23/2018 CLINICAL DATA:  Large bowel obstruction EXAM: DG ABDOMEN ACUTE W/ 1V CHEST COMPARISON:  Abdominal CT from yesterday FINDINGS: New large volume pneumoperitoneum. Pneumatosis is again seen along the distended proximal colon. No dilated small bowel. Low volume chest with atelectatic opacities.  Normal heart size. These results were called by telephone at the time of interpretation on 11/23/2018 at 6:59 am to Dr. Ky Barban, who verbally acknowledged these results. IMPRESSION: 1. Interval perforation with large volume pneumoperitoneum. 2. Colonic  distension with ascending segment pneumatosis. Electronically Signed   By: Monte Fantasia M.D.   On: 11/23/2018 07:00   Danna Hefty, DO 11/26/2018, 1:04 PM PGY-1, [facility] Intern pager: [phone], text pages welcome

## 2018-11-26 NOTE — Plan of Care (Signed)
  Problem: Health Behavior/Discharge Planning: Goal: Ability to manage health-related needs will improve Outcome: Progressing   Problem: Clinical Measurements: Goal: Ability to maintain clinical measurements within normal limits will improve Outcome: Progressing Goal: Will remain free from infection Outcome: Progressing Goal: Diagnostic test results will improve Outcome: Progressing   Problem: Activity: Goal: Risk for activity intolerance will decrease Outcome: Progressing   Problem: Nutrition: Goal: Adequate nutrition will be maintained Outcome: Progressing   Problem: Coping: Goal: Level of anxiety will decrease Outcome: Progressing   Problem: Elimination: Goal: Will not experience complications related to bowel motility Outcome: Progressing Goal: Will not experience complications related to urinary retention Outcome: Progressing   Problem: Pain Managment: Goal: General experience of comfort will improve Outcome: Progressing   Problem: Safety: Goal: Ability to remain free from injury will improve Outcome: Progressing   Problem: Skin Integrity: Goal: Risk for impaired skin integrity will decrease Outcome: Progressing   Problem: Clinical Measurements: Goal: Postoperative complications will be avoided or minimized Outcome: Progressing   Problem: Skin Integrity: Goal: Demonstration of wound healing without infection will improve Outcome: Progressing

## 2018-11-27 ENCOUNTER — Inpatient Hospital Stay (HOSPITAL_COMMUNITY): Payer: Medicare Other

## 2018-11-27 LAB — CBC
HCT: 29.1 % — ABNORMAL LOW (ref 39.0–52.0)
Hemoglobin: 10 g/dL — ABNORMAL LOW (ref 13.0–17.0)
MCH: 29.2 pg (ref 26.0–34.0)
MCHC: 34.4 g/dL (ref 30.0–36.0)
MCV: 84.8 fL (ref 80.0–100.0)
PLATELETS: 218 10*3/uL (ref 150–400)
RBC: 3.43 MIL/uL — ABNORMAL LOW (ref 4.22–5.81)
RDW: 13.9 % (ref 11.5–15.5)
WBC: 18.6 10*3/uL — ABNORMAL HIGH (ref 4.0–10.5)
nRBC: 0 % (ref 0.0–0.2)

## 2018-11-27 LAB — RENAL FUNCTION PANEL
ANION GAP: 8 (ref 5–15)
Albumin: 1.9 g/dL — ABNORMAL LOW (ref 3.5–5.0)
BUN: 8 mg/dL (ref 6–20)
CALCIUM: 7.8 mg/dL — AB (ref 8.9–10.3)
CO2: 29 mmol/L (ref 22–32)
Chloride: 99 mmol/L (ref 98–111)
Creatinine, Ser: 0.83 mg/dL (ref 0.61–1.24)
GFR calc Af Amer: >60 mL/min (ref >60)
GFR calc non Af Amer: >60 mL/min (ref >60)
Glucose, Bld: 154 mg/dL — ABNORMAL HIGH (ref 70–99)
Phosphorus: 3.3 mg/dL (ref 2.5–4.6)
Potassium: 3.2 mmol/L — ABNORMAL LOW (ref 3.5–5.1)
Sodium: 136 mmol/L (ref 135–145)

## 2018-11-27 LAB — MAGNESIUM: Magnesium: 1.9 mg/dL (ref 1.7–2.4)

## 2018-11-27 MED ORDER — OXYCODONE HCL 5 MG PO TABS: 2.5000 mg | ORAL_TABLET | ORAL | Status: DC | PRN

## 2018-11-27 MED ORDER — POTASSIUM CHLORIDE CRYS ER 20 MEQ PO TBCR
40.0000 meq | EXTENDED_RELEASE_TABLET | Freq: Two times a day (BID) | ORAL | Status: DC
  Administered 2018-11-27 – 2018-11-28 (×4): 40 meq via ORAL
  Filled 2018-11-27 (×5): qty 2

## 2018-11-27 MED ORDER — OXYCODONE HCL 5 MG PO TABS
5.0000 mg | ORAL_TABLET | ORAL | Status: DC | PRN
  Administered 2018-11-27 – 2018-11-30 (×11): 5 mg via ORAL
  Filled 2018-11-27 (×11): qty 1

## 2018-11-27 MED ORDER — IPRATROPIUM-ALBUTEROL 0.5-2.5 (3) MG/3ML IN SOLN
3.0000 mL | Freq: Two times a day (BID) | RESPIRATORY_TRACT | Status: DC
  Administered 2018-11-27 – 2018-11-30 (×6): 3 mL via RESPIRATORY_TRACT
  Filled 2018-11-27 (×6): qty 3

## 2018-11-27 MED ORDER — IPRATROPIUM-ALBUTEROL 0.5-2.5 (3) MG/3ML IN SOLN: 3.0000 mL | Freq: Two times a day (BID) | RESPIRATORY_TRACT | Status: DC

## 2018-11-27 MED ORDER — POTASSIUM CHLORIDE CRYS ER 20 MEQ PO TBCR: 20.0000 meq | EXTENDED_RELEASE_TABLET | Freq: Two times a day (BID) | ORAL | Status: DC

## 2018-11-27 NOTE — Progress Notes (Signed)
Pt declines the use of the CPAP machine. He states that he's claustrophobic and cannot tolerate the mask.

## 2018-11-27 NOTE — Progress Notes (Signed)
[facility]/Trauma Progress Note  4 Days Post-Op   Assessment/Plan ?HCAP - per primary team Transaminitis - improving GERD HLD Bipolar disorder ADHD Insomnia Chronic back pain   Sigmoid stricture with perforated cecum S/P ex lap with resection of R colon and ileostomy, decompressive transverse colostomy, resection of sigmoid colon 11/23/18 Dr. Kieth Brightly - mobilize - pathology showed diverticulitis  - continue JP - 225 cc of ss output in 24 hrs  FEN: soft diet.  Hypokalemia 3.2 VTE: SCDs, SQ heparin ID: flagyl 2/3>>, cefepime 2/5>>   WBC up to 18.6  Follow up: Dr. Kieth Brightly  Dispo: Monitor WBC. Pt will need 10-14 days total of antibiotics.              Consider CT if leukocytosis persists but clinically looks well             Replace KCl  LOS: 5 days    Subjective: CC: mild abdominal pain  Pt is tolerating a diet and having bowel function. Wife at bedside. No issues overnight. Pt denies nausea, vomiting, fever or chills.   Objective: Vital signs in last 24 hours: Temp:  [98.6 F (37 C)-98.7 F (37.1 C)] 98.6 F (37 C) (02/08 0514) Pulse Rate:  [100-104] 104 (02/08 0514) Resp:  [16-17] 17 (02/08 0514) BP: (116-118)/(64-66) 116/66 (02/08 0514) SpO2:  [91 %] 91 % (02/08 0514) Last BM Date: 11/25/18  Intake/Output from previous day: 02/07 0701 - 02/08 0700 In: 1066 [P.O.:474; I.V.:492; IV Piggyback:100] Out: 2610 [Urine:1210; Drains:280; UUVOZ:3664] Intake/Output this shift: Total I/O In: -  Out: 110 [Drains:10; Stool:100]  PE: Gen:  Alert, NAD, pleasant, cooperative Pulm: rate and effort normal Abd: Soft, ND, +BS, midline VAC with good seal, JP in LLQ with ss fluid, Colostomy pink with small amount brown stool, ileostomy red with brown stool, mild generalized TTP without guarding. No peritonitis  Skin: no rashes noted, warm and dry   Anti-infectives: Anti-infectives (From admission, onward)   Start     Dose/Rate Route Frequency Ordered Stop    11/26/18 1400  metroNIDAZOLE (FLAGYL) tablet 500 mg     500 mg Oral Every 8 hours 11/26/18 1221 12/06/18 1359   11/26/18 1230  cefdinir (OMNICEF) capsule 300 mg     300 mg Oral Every 12 hours 11/26/18 1221 12/06/18 0959   11/24/18 1400  ceFEPIme (MAXIPIME) 2 g in sodium chloride 0.9 % 100 mL IVPB  Status:  Discontinued     2 g 200 mL/hr over 30 Minutes Intravenous Every 8 hours 11/24/18 1219 11/26/18 1221   11/23/18 2000  cefTRIAXone (ROCEPHIN) 2 g in sodium chloride 0.9 % 100 mL IVPB  Status:  Discontinued     2 g 200 mL/hr over 30 Minutes Intravenous Every 24 hours 11/23/18 1424 11/24/18 1216   11/23/18 1000  ciprofloxacin (CIPRO) IVPB 400 mg     400 mg 200 mL/hr over 60 Minutes Intravenous  Once 11/23/18 0953 11/23/18 1409   11/23/18 0600  aztreonam (AZACTAM) 2 g in sodium chloride 0.9 % 100 mL IVPB  Status:  Discontinued     2 g 200 mL/hr over 30 Minutes Intravenous Every 8 hours 11/22/18 2046 11/23/18 1424   11/22/18 2030  aztreonam (AZACTAM) 2 g in sodium chloride 0.9 % 100 mL IVPB     2 g 200 mL/hr over 30 Minutes Intravenous  Once 11/22/18 2027 11/22/18 2131   11/22/18 2030  metroNIDAZOLE (FLAGYL) IVPB 500 mg  Status:  Discontinued     500 mg 100 mL/hr  over 60 Minutes Intravenous Every 8 hours 11/22/18 2027 11/26/18 1221      Lab Results:  Recent Labs    11/26/18 0549 11/27/18 0402  WBC 14.6* 18.6*  HGB 10.2* 10.0*  HCT 29.4* 29.1*  PLT 188 218   BMET Recent Labs    11/26/18 0549 11/27/18 0402  NA 134* 136  K 3.5 3.2*  CL 99 99  CO2 26 29  GLUCOSE 136* 154*  BUN 9 8  CREATININE 0.94 0.83  CALCIUM 7.8* 7.8*   PT/INR No results for input(s): LABPROT, INR in the last 72 hours. CMP     Component Value Date/Time   NA 136 11/27/2018 0402   NA 139 10/04/2018 1520   K 3.2 (L) 11/27/2018 0402   CL 99 11/27/2018 0402   CO2 29 11/27/2018 0402   GLUCOSE 154 (H) 11/27/2018 0402   BUN 8 11/27/2018 0402   BUN 11 10/04/2018 1520   CREATININE 0.83 11/27/2018  0402   CREATININE 0.98 04/28/2016 1454   CALCIUM 7.8 (L) 11/27/2018 0402   PROT 5.3 (L) 11/26/2018 0549   PROT 7.4 10/04/2018 1520   ALBUMIN 1.9 (L) 11/27/2018 0402   ALBUMIN 4.5 10/04/2018 1520   AST 37 11/26/2018 0549   ALT 35 11/26/2018 0549   ALKPHOS 61 11/26/2018 0549   BILITOT 1.1 11/26/2018 0549   BILITOT <0.2 10/04/2018 1520   GFRNONAA >60 11/27/2018 0402   GFRNONAA >89 04/28/2016 1454   GFRAA >60 11/27/2018 0402   GFRAA >89 04/28/2016 1454   Lipase     Component Value Date/Time   LIPASE 21 11/22/2018 1634    Studies/Results: No results found.    [facility] 11/27/2018, 9:39 AM

## 2018-11-27 NOTE — Progress Notes (Addendum)
Family Medicine Teaching Service Daily Progress Note Intern Pager: [phone]  Patient name: [name] Medical record number: [id number] Date of birth: 09-16-1981 Age: 38 y.o. 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 male presenting with abdominal pain concerning for bowel obstruction. PMH is significant for Bipolar disorder, HLD, substance abuse, beckers muscular dystrophy  Leukocytosis: Worsening leukocytosis 9.2>14.6>18.6. Antibiotics narrowed to Cefdinir and Flagyl yesterday. Continues to have stable tachycardia (low 100's) with increased O2 demand (RA to 3L ON) although denies any SOB. Lungs clear on exam. Denies dysuria. Abdomen mildly tender but not severe concerning for acute abdomen. PE is possible given recent surgery, however has been on Heparin SQ and denies any CP or SOB, and would not expect leukocytosis. - Repeat 2-view chest x-ray - Blood and urine culture - monitor white count with AM CBC  - Per surgery, CT scan in AM if worsening leukocytosis - vitals q4 - If fever or worsening vitals - begin zosyn, discontinue Cefdinir/Flagyl  Large bowel obstruction w/ perforation s/p colectomy: POD4 s/p  exploratory laparotomy and partial colectomy (right colon and sigmoid colon) with ileostomy/transverse colostomy and wound vac in place. Hemoglobin has remained stable. Has remained hemodynamically stable and afebrile overnight, however had some mild tachycardia overnight (low 100's) and new O2 requirement (3L) with sats to 91%. Difficult to inspect wounds with ostomy bags in place. Electrolytes stable except for low K+. Improvement in stool output since adding Imodium (2.1L on 2/6 to 1.1L stool output on 2/7). Patient notes pain well managed, required 3 doses of Oxy 16m overnight. Pain 0/10 this morning. Still appears groggy, may be opioid naive.  Ambulating well, tolerating PO, and passing flatus. Using incentive spirometer as requested q1hr.  - Continue Cefdinir (2/7-) and Flagyl (2/3-) for 14 days totals - Surgery following, appreciate recs - Continue Imodium  - Continue scheduled Tylenol  - Decrease to Oxy 2.5 and 551mfor moderate to severe pain/break through pain - k-pad - continuous cardiac monitoring, continuous pulse ox - incentive spirometry q1 hr, Duoneb BID  - zofran PRN - monitor fever curve, vitals, AM CBC - Nutrition consulted, appreciate recs:  Ensure Enlive po BID, MVI daily  - Monitor Mag, Phos in AM labs - PT/OT consulted: Home health PT, no OT, Rolling walker, bedside commode  Hypokalemia: K 3.2 today - Begin 4039mkdur BID  Chronic Tobacco Use: Endorses smoking cigarretes at home. Declines a nicotine patch. - Duonebs BID  Pre-diabetes:  A1C 6.4 (up from 6.3). H/o of allergy to metformin. CBGs 130-200 - sSSI - monitor CBG's  HLD:  Home meds: Lipitor 85m23mast lipid panel: LDL 169 - Continue Lipitor 85mg52mpolar disorder - Home meds: Latuda 80mg 3m Viibryd 85mg Q57mamtorigine 50mg QD75mmed list but not taking. - continue latuda - continue  viibryd  ADHD - patient takes vyvanse 83m w/ breakfast, 272mat lunch.  - continue vyvanse  Chronic back pain  -  patient does 'professional wrestling'. Home med: gabapentin 30075mD - continue gabapentin  GERD - patient takes 11m53milosec at home - PO protonix 11mg32msomnia - Home meds: Trazodone 50mg 26m- continue home meds  Elevated Transaminases: Resolved AST: 43>72>37, ALT 35>58>35, alk phos WNL.  - f/u hepatic panel in AM   FEN/GI: Reg diet, replace electrolytes PRN, Protonix PO, MVI Prophylaxis: heparin subQ.   Disposition: pending improvement s/p surgery  Subjective:  Patient reports feeling well overnight. Denies any CP, SOB, dysuria. Denies any pain this morning (0/10). He notes his pain has been well managed with Tylenol and oxy as needed. Has been using incentive spirometry, tolerating PO without N/V, and ambulating around room without difficulty. He notes he is passing flatus as well.   Objective: Temp:  [98.6 F (37 C)-98.7 F (37.1 C)] 98.6 F (37 C) (02/08 0514) Pulse Rate:  [100-104] 104 (02/08 0514) Resp:  [16-17] 17 (02/08 0514) BP: (116-118)/(64-66) 116/66 (02/08 0514) SpO2:  [91 %] 91 % (02/08 0514) Physical Exam: General: large, caucasian male, well nourished, well developed, in no acute distress with non-toxic appearance, awake sitting up in bed, appears groggy, wife at bedside CV: regular rate and rhythm without murmurs, rubs, or gallops, no lower extremity edema, 2+ radial and pedal pulses bilaterally Lungs: lungs clear to ausculation without any wheezes or crackles appreciated, normal work of breathing with [place] in place (3L currently) Abdomen: soft, tender, large midline incision with vacuum seal in place, double ostomy bags with minimal amount of fluid in each, difficult to assess healing with ostomy bags in place Skin: warm, dry Extremities: warm and well perfused, normal tone  Laboratory: Recent Labs  Lab 11/25/18 0321 11/26/18 0549 11/27/18 0402  WBC 9.2 14.6* 18.6*  HGB 10.1* 10.2* 10.0*  HCT 29.6* 29.4* 29.1*  PLT 144* 188 218   Recent Labs   Lab 11/23/18 0208  11/24/18 0324 11/25/18 0321 11/26/18 0549 11/27/18 0402  NA 135   < > 135 133* 134* 136  K 2.9*   < > 3.3* 3.5 3.5 3.2*  CL 102   < > 106 99 99 99  CO2 26   < > 18* _0 BUN 11   < > _1 CREATININE 1.02   < > 1.08 0.94 0.94 0.83  CALCIUM 8.2*   < > 6.8* 8.1* 7.8* 7.8*  PROT 6.7  --  4.8*  --  5.3*  --   BILITOT 1.0  --  1.1  --  1.1  --   ALKPHOS 77  --  43  --  61  --   ALT 58*  --  50*  --  35  --   AST 72*  --  47*  --  37  --   GLUCOSE 146*   < > 173* 140* 136* 154*   < > = values in this interval not displayed.   Hepatic Function Latest Ref Rng & Units 11/27/2018 11/26/2018 11/24/2018  Total Protein 6.5 - 8.1 g/dL - 5.3(L) 4.8(L)  Albumin 3.5 - 5.0 g/dL 1.9(L) 2.0(L) 2.2(L)  AST 15 - 41 U/L - 37 47(H)  ALT 0 - 44 U/L - 35 50(H)  Alk Phosphatase 38 - 126 U/L - 61 43  Total Bilirubin 0.3 - 1.2 mg/dL - 1.1 1.1  Bilirubin, Direct 0.0 -  0.2 mg/dL - - -   Lipase: 21 (nl) Troponin: <0.03 D-Dimer: 5.74 Lactic acid: 1.0>0.8 Mag: 1.5>1.9 Phos: 3.3 A1C: 6.4 CEA: 2.6  Urine Culture (2/4): no growth  Imaging/Diagnostic Tests: Dg Chest 2 View - Result Date: 11/22/2018 CLINICAL DATA:  Severe chest pain. EXAM: CHEST - 2 VIEW COMPARISON:  Chest x-ray dated 04/26/2007 and abdominal radiographs dated 11/19/2018 FINDINGS: The heart size and pulmonary vascularity are normal. There is slight atelectasis at the left lung base. No consolidative infiltrates or effusions. No bone abnormality. Slight distention of the colon. IMPRESSION: Slight atelectasis at the left lung base which may be due to a shallow inspiration. Increased gaseous distention of the colon since 11/19/2018. Electronically Signed   By: Lorriane Shire M.D.   On: 11/22/2018 16:39   Ct Angio Chest Pe W And/or Wo Contrast - Result Date: 11/22/2018 IMPRESSION: 1. No evidence of significant pulmonary embolus. 2. Linear atelectasis in the left lung. 3. Mosaic attenuation pattern to the lungs is probably due  to motion artifact but could indicate air trapping or edema. 4. Segmental wall thickening of the sigmoid colon with abrupt shouldered margins and stranding in the adjacent fat. Adjacent to the colon, there is an enhancing area of infiltration contiguous with the bladder wall along with bladder wall thickening. This could represent diverticulitis with fistula formation although the abrupt margins of the lesion are suspicious for colon cancer in which case this would be locally invasive. Surgical consultation is recommended. 5. Interval development of proximal colonic obstruction with gas and fluid levels. 6. Interval development of right colonic pneumatosis. This may be related to obstruction or could indicate developing bowel necrosis. 7. Small amount of free fluid in the abdomen and pelvis, progressing since previous study.   Ct Abdomen Pelvis W Contrast - Result Date: 11/22/2018 IMPRESSION: 1. No evidence of significant pulmonary embolus. 2. Linear atelectasis in the left lung. 3. Mosaic attenuation pattern to the lungs is probably due to motion artifact but could indicate air trapping or edema. 4. Segmental wall thickening of the sigmoid colon with abrupt shouldered margins and stranding in the adjacent fat. Adjacent to the colon, there is an enhancing area of infiltration contiguous with the bladder wall along with bladder wall thickening. This could represent diverticulitis with fistula formation although the abrupt margins of the lesion are suspicious for colon cancer in which case this would be locally invasive. Surgical consultation is recommended. 5. Interval development of proximal colonic obstruction with gas and fluid levels. 6. Interval development of right colonic pneumatosis. This may be related to obstruction or could indicate developing bowel necrosis. 7. Small amount of free fluid in the abdomen and pelvis, progressing since previous study.   Dg Chest Port 1 View - Result Date:  11/24/2018 CLINICAL DATA:  Chest pain EXAM: PORTABLE CHEST 1 VIEW COMPARISON:  11/22/2018 FINDINGS: Bilateral interstitial thickening, left greater than right with a lower lobe predominance. No pleural effusion or pneumothorax. Stable cardiomediastinal silhouette. No acute osseous abnormality. IMPRESSION: Bilateral interstitial thickening, left greater than right with a lower lobe predominance. Differential considerations include mild pulmonary edema versus multilobar pneumonia. Electronically Signed   By: Kathreen Devoid   On: 11/24/2018 00:54   Dg Abd Acute 2+v W 1v Chest - Result Date: 11/23/2018 CLINICAL DATA:  Large bowel obstruction EXAM: DG ABDOMEN ACUTE W/ 1V CHEST COMPARISON:  Abdominal CT from yesterday FINDINGS: New large volume pneumoperitoneum. Pneumatosis is again seen along the distended proximal colon. No dilated small bowel. Low volume chest with  atelectatic opacities.  Normal heart size. These results were called by telephone at the time of interpretation on 11/23/2018 at 6:59 am to Dr. Ky Barban, who verbally acknowledged these results. IMPRESSION: 1. Interval perforation with large volume pneumoperitoneum. 2. Colonic distension with ascending segment pneumatosis. Electronically Signed   By: Monte Fantasia M.D.   On: 11/23/2018 07:00   Danna Hefty, DO 11/27/2018, 7:31 AM PGY-1, [facility] Intern pager: [phone], text pages welcome

## 2018-11-28 DIAGNOSIS — Z933 Colostomy status: Secondary | ICD-10-CM

## 2018-11-28 DIAGNOSIS — K5732 Diverticulitis of large intestine without perforation or abscess without bleeding: Secondary | ICD-10-CM

## 2018-11-28 LAB — CBC
HCT: 28.3 % — ABNORMAL LOW (ref 39.0–52.0)
HEMOGLOBIN: 9.6 g/dL — AB (ref 13.0–17.0)
MCH: 29 pg (ref 26.0–34.0)
MCHC: 33.9 g/dL (ref 30.0–36.0)
MCV: 85.5 fL (ref 80.0–100.0)
Platelets: 250 10*3/uL (ref 150–400)
RBC: 3.31 MIL/uL — AB (ref 4.22–5.81)
RDW: 14 % (ref 11.5–15.5)
WBC: 16.2 10*3/uL — ABNORMAL HIGH (ref 4.0–10.5)
nRBC: 0.1 % (ref 0.0–0.2)

## 2018-11-28 LAB — HEPATIC FUNCTION PANEL
ALT: 26 U/L (ref 0–44)
AST: 30 U/L (ref 15–41)
Albumin: 1.8 g/dL — ABNORMAL LOW (ref 3.5–5.0)
Alkaline Phosphatase: 73 U/L (ref 38–126)
Bilirubin, Direct: 0.2 mg/dL (ref 0.0–0.2)
Indirect Bilirubin: 0.4 mg/dL (ref 0.3–0.9)
Total Bilirubin: 0.6 mg/dL (ref 0.3–1.2)
Total Protein: 5.9 g/dL — ABNORMAL LOW (ref 6.5–8.1)

## 2018-11-28 LAB — RENAL FUNCTION PANEL
Albumin: 1.8 g/dL — ABNORMAL LOW (ref 3.5–5.0)
Anion gap: 10 (ref 5–15)
BUN: 8 mg/dL (ref 6–20)
CO2: 29 mmol/L (ref 22–32)
Calcium: 8.1 mg/dL — ABNORMAL LOW (ref 8.9–10.3)
Chloride: 97 mmol/L — ABNORMAL LOW (ref 98–111)
Creatinine, Ser: 0.84 mg/dL (ref 0.61–1.24)
GFR calc Af Amer: >60 mL/min
GFR calc non Af Amer: >60 mL/min
Glucose, Bld: 171 mg/dL — ABNORMAL HIGH (ref 70–99)
Phosphorus: 3.9 mg/dL (ref 2.5–4.6)
Potassium: 3.3 mmol/L — ABNORMAL LOW (ref 3.5–5.1)
Sodium: 136 mmol/L (ref 135–145)

## 2018-11-28 LAB — URINE CULTURE: Culture: NO GROWTH

## 2018-11-28 LAB — GLUCOSE, CAPILLARY: Glucose-Capillary: 148 mg/dL — ABNORMAL HIGH (ref 70–99)

## 2018-11-28 LAB — MAGNESIUM: Magnesium: 1.9 mg/dL (ref 1.7–2.4)

## 2018-11-28 MED ORDER — FUROSEMIDE 20 MG PO TABS
20.0000 mg | ORAL_TABLET | Freq: Once | ORAL | Status: AC
  Administered 2018-11-28: 20 mg via ORAL
  Filled 2018-11-28: qty 1

## 2018-11-28 MED ORDER — SALINE SPRAY 0.65 % NA SOLN
1.0000 | NASAL | Status: DC | PRN
  Administered 2018-11-28: 1 via NASAL
  Filled 2018-11-28: qty 44

## 2018-11-28 MED ORDER — INSULIN ASPART 100 UNIT/ML ~~LOC~~ SOLN: 0.0000 [IU] | Freq: Every day | SUBCUTANEOUS | Status: DC

## 2018-11-28 MED ORDER — INSULIN ASPART 100 UNIT/ML ~~LOC~~ SOLN
0.0000 [IU] | Freq: Three times a day (TID) | SUBCUTANEOUS | Status: DC
  Administered 2018-11-29 (×2): 2 [IU] via SUBCUTANEOUS
  Administered 2018-11-29: 1 [IU] via SUBCUTANEOUS
  Administered 2018-11-30 (×2): 2 [IU] via SUBCUTANEOUS

## 2018-11-28 NOTE — Progress Notes (Signed)
Patient ID: [name], male   DOB: 05-02-1981, 38 y.o.   MRN: [id number] [facility] Surgery/Trauma Progress Note  5 Days Post-Op   Assessment/Plan ?HCAP - per primary team Transaminitis - improving GERD HLD Bipolar disorder ADHD Insomnia Chronic back pain   Sigmoid stricture with perforated cecum S/P ex lap with resection of R colon and ileostomy, decompressive transverse colostomy, resection of sigmoid colon 11/23/18 Dr. Kieth Brightly - mobilize - pathology showed diverticulitis  - continue JP - 225 cc of ss output in 24 hrs  FEN: soft diet.  Hypokalemia 3.2 VTE: SCDs, SQ heparin ID: flagyl 2/3>>, cefepime 2/5>>   WBC improved, 16.2 Follow up: Dr. Kieth Brightly  Dispo: Monitor WBC.  Repeat tomorrow pt will need 10-14 days total of antibiotics.              Consider CT if leukocytosis persists but clinically looks well             Replace KCl  LOS: 6 days    Subjective: CC: mild abdominal pain, no better or worse.  Overall he and his wife feel he is doing better.  Pt is tolerating a diet and having bowel function. Wife at bedside. No issues overnight. Pt denies nausea, vomiting, fever or chills.   Objective: Vital signs in last 24 hours: Temp:  [97.5 F (36.4 C)-98.2 F (36.8 C)] 98 F (36.7 C) (02/09 0605) Pulse Rate:  [91-103] 91 (02/09 0605) Resp:  [17-20] 18 (02/09 0605) BP: (112-123)/(60-66) 118/63 (02/09 0605) SpO2:  [92 %-96 %] 95 % (02/09 0605) Last BM Date: 11/27/18(thru ostomy)  Intake/Output from previous day: 02/08 0701 - 02/09 0700 In: 660 [P.O.:660] Out: 1458 [Urine:625; Drains:58; Stool:775] Intake/Output this shift: No intake/output data recorded.  PE: Gen:  Alert, NAD, pleasant, cooperative Pulm: rate and effort normal Abd: Soft, ND, +BS, midline VAC with good seal, JP in LLQ with ss fluid, Colostomy pink with small amount brown stool, ileostomy red with brown stool, mild generalized TTP without guarding. No peritonitis  Skin: no rashes  noted, warm and dry   Anti-infectives: Anti-infectives (From admission, onward)   Start     Dose/Rate Route Frequency Ordered Stop   11/26/18 1400  metroNIDAZOLE (FLAGYL) tablet 500 mg     500 mg Oral Every 8 hours 11/26/18 1221 12/06/18 1359   11/26/18 1230  cefdinir (OMNICEF) capsule 300 mg     300 mg Oral Every 12 hours 11/26/18 1221 12/06/18 0959   11/24/18 1400  ceFEPIme (MAXIPIME) 2 g in sodium chloride 0.9 % 100 mL IVPB  Status:  Discontinued     2 g 200 mL/hr over 30 Minutes Intravenous Every 8 hours 11/24/18 1219 11/26/18 1221   11/23/18 2000  cefTRIAXone (ROCEPHIN) 2 g in sodium chloride 0.9 % 100 mL IVPB  Status:  Discontinued     2 g 200 mL/hr over 30 Minutes Intravenous Every 24 hours 11/23/18 1424 11/24/18 1216   11/23/18 1000  ciprofloxacin (CIPRO) IVPB 400 mg     400 mg 200 mL/hr over 60 Minutes Intravenous  Once 11/23/18 0953 11/23/18 1409   11/23/18 0600  aztreonam (AZACTAM) 2 g in sodium chloride 0.9 % 100 mL IVPB  Status:  Discontinued     2 g 200 mL/hr over 30 Minutes Intravenous Every 8 hours 11/22/18 2046 11/23/18 1424   11/22/18 2030  aztreonam (AZACTAM) 2 g in sodium chloride 0.9 % 100 mL IVPB     2 g 200 mL/hr over 30 Minutes Intravenous  Once  11/22/18 2027 11/22/18 2131   11/22/18 2030  metroNIDAZOLE (FLAGYL) IVPB 500 mg  Status:  Discontinued     500 mg 100 mL/hr over 60 Minutes Intravenous Every 8 hours 11/22/18 2027 11/26/18 1221      Lab Results:  Recent Labs    11/27/18 0402 11/28/18 0524  WBC 18.6* 16.2*  HGB 10.0* 9.6*  HCT 29.1* 28.3*  PLT 218 250   BMET Recent Labs    11/27/18 0402 11/28/18 0524  NA 136 136  K 3.2* 3.3*  CL 99 97*  CO2 29 29  GLUCOSE 154* 171*  BUN 8 8  CREATININE 0.83 0.84  CALCIUM 7.8* 8.1*   PT/INR No results for input(s): LABPROT, INR in the last 72 hours. CMP     Component Value Date/Time   NA 136 11/28/2018 0524   NA 139 10/04/2018 1520   K 3.3 (L) 11/28/2018 0524   CL 97 (L) 11/28/2018 0524    CO2 29 11/28/2018 0524   GLUCOSE 171 (H) 11/28/2018 0524   BUN 8 11/28/2018 0524   BUN 11 10/04/2018 1520   CREATININE 0.84 11/28/2018 0524   CREATININE 0.98 04/28/2016 1454   CALCIUM 8.1 (L) 11/28/2018 0524   PROT 5.9 (L) 11/28/2018 0524   PROT 7.4 10/04/2018 1520   ALBUMIN 1.8 (L) 11/28/2018 0524   ALBUMIN 1.8 (L) 11/28/2018 0524   ALBUMIN 4.5 10/04/2018 1520   AST 30 11/28/2018 0524   ALT 26 11/28/2018 0524   ALKPHOS 73 11/28/2018 0524   BILITOT 0.6 11/28/2018 0524   BILITOT <0.2 10/04/2018 1520   GFRNONAA >60 11/28/2018 0524   GFRNONAA >89 04/28/2016 1454   GFRAA >60 11/28/2018 0524   GFRAA >89 04/28/2016 1454   Lipase     Component Value Date/Time   LIPASE 21 11/22/2018 1634    Studies/Results: Dg Chest 2 View  Result Date: 11/27/2018 CLINICAL DATA:  Shortness of breath, leukocytosis. EXAM: CHEST - 2 VIEW COMPARISON:  Radiograph of November 23, 2018. FINDINGS: The heart size and mediastinal contours are within normal limits. No pneumothorax or pleural effusion is noted. Increased bilateral diffuse lung opacities are noted concerning for worsening pneumonia or possibly edema. The visualized skeletal structures are unremarkable. IMPRESSION: Increased bilateral diffuse lung opacities are noted concerning for worsening pneumonia or edema. Electronically Signed   By: Marijo Conception, M.D.   On: 11/27/2018 18:44      [facility] 11/28/2018, 8:53 AM

## 2018-11-28 NOTE — Progress Notes (Addendum)
Educated patient on ostomy care (how to empty and dispell air) by way of demonstration.  Patient refused to participate in self care.  Incorporated wife into the demonstration as well.  Will continue to monitor.

## 2018-11-28 NOTE — Progress Notes (Signed)
Patient refused CPAP Hs tonight.

## 2018-11-28 NOTE — Progress Notes (Addendum)
Family Medicine Teaching Service Daily Progress Note Intern Pager: [phone]  Patient name: [name] Medical record number: [id number] Date of birth: 1981-04-18 Age: 38 y.o. Gender: male  Primary Care Provider: Lovenia , MD Consultants: Dietician, Gen Surgery, [place] Code Status: Full Code   Pt Overview and Major Events to Date:  Hospital Day: 7  Admitted: 11/22/2018 for CC: Chest Pain  Active Problems:   Abdominal pain   Colon obstruction (HCC)   Leukocytosis   Hypokalemia  Assessment and Plan: [name] is a 38 y.o. male admitted for abdominal pain with free air on chest x-ray and diagnosed with large bowel perforation status post ex lap resulting in right partial colectomy, sigmoidectomy, terminal ileostomy, transverse colostomy.  His chronic conditions include prediabetes, HLD, depression, Bipolar, ADHD, chronic back pain, GERD, insomnia.   #Cecal perforation secondary to diverticulitis  #S/P ex lap & right partial colectomy and sigmoidectomy, terminal ileostomy, and decompressive transverse colostomy  POD 5 Patient is sitting up in his chair today and reports improvement in pain after leaving his bed. Mg and Phos stable   Plans per surgery Recs  Follow JP drain  Continue Abx x 10-14 days  Replace K  Imodium for high ostomy output   Daily BMP for K  Pain control  Scheduled Tylenol.   Oxycodone 2-1/2 mg - 5 mg for moderate - severe breakthrough pain respectively  # Acute leukocytosis 16.2 from 18.6 yesterday.  Antibiotics have been,  narrowed to cefdinir and Flagyl.  Likely due to postop.  Acute work-up has been negative thus far with exception of chest x-ray that shows bilateral haziness concerning for pleural effusion versus atelectasis versus pneumonia.  Patient has not had any fever and vital signs have been stable, making pneumonia less likely.  Patient has also been using incentive spirometer consistently through his hospitalization. Urine culture 2/8 NG  final   Continue to monitor with a.m. CBC with differential  Continue cefdinir (2/7- ) and Flagyl (2/3- )14 days total  If fever or worsening abdominal pain  CT abdomen  Discontinue cefdinir and Flagyl and begin Zosyn.  Blood cultures x2  #Bilateral lower lobe haziness Likely due to pleural effusions as above.  Lung sounds are clear on exam today with no decreased breath sounds.  P.o. 20 mg Lasix  Strict I's and O's  #Hypoxemia  Continues to be on 3 L supplemental oxygen since postop and has been satting in low 90's. He desats to mid 80's with weaning O2 to 1.5L today. No O2 requirement at home. Patient is chronic tobacco user at home but no chronic pulmonary issues with exception of OSA (no CPAP at home). Patient has history of Beckers musculodystrophy. As this typically presents later on in life as cardiac manifestation, would be helpful to get an Echo (last in 2005) in setting of pulmonary edema. Seen last by Dr. Gaynell Face (peds neuro) when patient was 38 years old. Last EKG on 2/4 with sinus tachy. Trop on 2/3 negative. No BNP.   Attempt to wean O2   CK tonight   Echo if CK elevated  Elicit further history from patient and symptoms  #Chronic hematuria  Patient reports that he has had cola colored urine for the last two months. Initially thought to be due to fistula seen on CT scan though no findings during surgery. In setting of patient's Becker's MD, could be myogloburia. Initial UA with small Hgb. Kidney function appears normal.   Urine quant myoglobin ?   Patient will  need o/p neurology   #Diabetes A1C 6.4; however, patient has had several elevated fasting blood glucoses during this admission, though in an acutely stressful setting.  Most recent fasting glucose 171 today.  Patient has not had any CBGs ordered and has not received any sliding scale.  Continue to sensitive sliding scale  CBG 4x daily   Will need continued o/p follow up  # HLD  Continue home  lipitor 40 mg   # Bipolar  Continue home latuda and viibryd    # ADHD  Vyvanse $Remove'50mg'fDyBQrs$  breakfast & $RemoveBef'20mg'PREQWwAfvc$  at lunch.   Discontinue?   # Chronic Back Pain   Continue home gabapentin    # GERD  PO PTX   # Insomnia  Home Trazadone $RemoveBefore'50mg'JhIjDwUEglhTL$  QHS   # FEN . No current infusions . Hypokalemia 3.2, 26mEq KDUR BID . Carb modified  # DVT Prophylaxis . heparin 5,000 units q8hrs   # Disposition . Trending Labs: CBC w/ diff, BMP  . Discharge to home with home health, pending improvement of pain, ostomy education and gen surgery  Subjective  Patient reports doing well overnight. No acute events. Pain improved with sitting up   Objective   Vital Signs Intake/Output  Temp:  [97.5 F (36.4 C)-98.2 F (36.8 C)] 98 F (36.7 C) (02/09 0605) Pulse Rate:  [91-103] 91 (02/09 0605) Resp:  [17-20] 18 (02/09 0605) BP: (112-123)/(60-66) 118/63 (02/09 0605) SpO2:  [92 %-96 %] 95 % (02/09 0605)  Filed Weights   11/22/18 1519  Weight: 112.5 kg   Intake/Output      02/08 0701 - 02/09 0700 02/09 0701 - 02/10 0700   P.O. 660    I.V. (mL/kg)     IV Piggyback     Total Intake(mL/kg) 660 (5.9)    Urine (mL/kg/hr) 625 (0.2) 350 (0.7)   Emesis/NG output     Drains 58    Stool 775    Total Output 1458 350   Net -798 -350           Physical Exam Gen: NAD, alert, non-toxic, sitting up in chair  Skin: Warm and dry  HEENT: NCAT.  MMM.  CV: RRR.  Normal S1-S2.  Resp: CTAB. No increased WOB. No diminished sounds.  Abd: Wound vac with good seal. Terminal ileostomy site w/ brownish collection in bag. Colostomy site with brownish collection in bag, though less volume than ileostomy site.  JP drain with serosanguinous fluid.  Extremities: moves extremities spontaneously. Warm and well perfused.   Laboratory: Recent Labs  Lab 11/22/18 1549 11/23/18 0208 11/23/18 1425 11/24/18 0324 11/25/18 0321 11/26/18 0549 11/27/18 0402 11/28/18 0524  WBC 9.8 7.1 7.5 11.5* 9.2 14.6* 18.6* 16.2*  HGB  14.8 13.0 13.4 10.9* 10.1* 10.2* 10.0* 9.6*  HCT 42.2 38.6* 39.1 31.8* 29.6* 29.4* 29.1* 28.3*  PLT 239 193 203 162 144* 188 218 250   Recent Labs  Lab 11/22/18 1634 11/22/18 1700 11/23/18 0208 11/23/18 1425 11/24/18 0324 11/25/18 0321 11/26/18 0549 11/27/18 0402 11/28/18 0524  NA  --  136 135 133* 135 133* 134* 136 136  K  --  2.9* 2.9* 3.8 3.3* 3.5 3.5 3.2* 3.3*  CL  --  97* 102 103 106 99 99 99 97*  CO2  --  23 26 20* 18* $Remov'24 26 29 29  'VaJhJM$ BUN  --  $R'12 11 18 15 11 9 8 8  'gz$ CREATININE  --  1.15 1.02 1.34* 1.08 0.94 0.94 0.83 0.84  CALCIUM  --  8.9 8.2*  7.5* 6.8* 8.1* 7.8* 7.8* 8.1*  PROT 7.6  --  6.7  --  4.8*  --  5.3*  --  5.9*  BILITOT 0.9  --  1.0  --  1.1  --  1.1  --  0.6  ALKPHOS 67  --  77  --  43  --  61  --  73  ALT 35  --  58*  --  50*  --  35  --  26  AST 43*  --  72*  --  47*  --  37  --  30  GLUCOSE  --  104* 146* 210* 173* 140* 136* 154* 171*   Urine culture: 2/8 - FINAL: NG  Imaging/Diagnostic Tests: Dg Chest 2 View  Result Date: 11/27/2018 CLINICAL DATA:  Shortness of breath, leukocytosis. EXAM: CHEST - 2 VIEW COMPARISON:  Radiograph of November 23, 2018. FINDINGS: The heart size and mediastinal contours are within normal limits. No pneumothorax or pleural effusion is noted. Increased bilateral diffuse lung opacities are noted concerning for worsening pneumonia or possibly edema. The visualized skeletal structures are unremarkable. IMPRESSION: Increased bilateral diffuse lung opacities are noted concerning for worsening pneumonia or edema. Electronically Signed   By: Marijo Conception, M.D.   On: 11/27/2018 18:44    Current Medications: Scheduled Meds: . acetaminophen  650 mg Oral Q6H  . atorvastatin  40 mg Oral QHS  . cefdinir  300 mg Oral Q12H  . feeding supplement (ENSURE ENLIVE)  237 mL Oral BID BM  . gabapentin  300 mg Oral Daily  . heparin  5,000 Units Subcutaneous Q8H  . ipratropium-albuterol  3 mL Nebulization BID  . lisdexamfetamine  20 mg Oral Q lunch   . lisdexamfetamine  50 mg Oral QAC breakfast  . lurasidone  80 mg Oral QHS  . metroNIDAZOLE  500 mg Oral Q8H  . multivitamin with minerals  1 tablet Oral Daily  . ondansetron  4 mg Oral Once  . pantoprazole  40 mg Oral QHS  . potassium chloride  40 mEq Oral BID  . traZODone  50 mg Oral QHS  . Vilazodone HCl  40 mg Oral Daily   PRN Meds: alum & mag hydroxide-simeth, ondansetron **OR** ondansetron (ZOFRAN) IV, oxyCODONE **OR** oxyCODONE  Wilber Oliphant, MD 11/28/2018, 11:36 AM PGY-1, [place] Intern pager: [phone], text pages welcome

## 2018-11-29 DIAGNOSIS — G7101 Duchenne or Becker muscular dystrophy: Secondary | ICD-10-CM

## 2018-11-29 LAB — GLUCOSE, CAPILLARY
Glucose-Capillary: 146 mg/dL — ABNORMAL HIGH (ref 70–99)
Glucose-Capillary: 153 mg/dL — ABNORMAL HIGH (ref 70–99)
Glucose-Capillary: 175 mg/dL — ABNORMAL HIGH (ref 70–99)
Glucose-Capillary: 148 mg/dL — ABNORMAL HIGH (ref 70–99)

## 2018-11-29 LAB — CBC WITH DIFFERENTIAL/PLATELET
Abs Immature Granulocytes: 0.52 10*3/uL — ABNORMAL HIGH (ref 0.00–0.07)
Basophils Absolute: 0 10*3/uL (ref 0.0–0.1)
Basophils Relative: 0 %
Eosinophils Absolute: 0.7 10*3/uL — ABNORMAL HIGH (ref 0.0–0.5)
Eosinophils Relative: 5 %
HCT: 29.5 % — ABNORMAL LOW (ref 39.0–52.0)
Hemoglobin: 9.5 g/dL — ABNORMAL LOW (ref 13.0–17.0)
Immature Granulocytes: 4 %
Lymphocytes Relative: 13 %
Lymphs Abs: 1.7 10*3/uL (ref 0.7–4.0)
MCH: 27.8 pg (ref 26.0–34.0)
MCHC: 32.2 g/dL (ref 30.0–36.0)
MCV: 86.3 fL (ref 80.0–100.0)
Monocytes Absolute: 0.8 10*3/uL (ref 0.1–1.0)
Monocytes Relative: 6 %
NRBC: 0.2 % (ref 0.0–0.2)
Neutro Abs: 9.7 10*3/uL — ABNORMAL HIGH (ref 1.7–7.7)
Neutrophils Relative %: 72 %
Platelets: 275 10*3/uL (ref 150–400)
RBC: 3.42 MIL/uL — ABNORMAL LOW (ref 4.22–5.81)
RDW: 14.2 % (ref 11.5–15.5)
WBC: 13.5 10*3/uL — ABNORMAL HIGH (ref 4.0–10.5)

## 2018-11-29 LAB — RENAL FUNCTION PANEL
Albumin: 1.9 g/dL — ABNORMAL LOW (ref 3.5–5.0)
Anion gap: 12 (ref 5–15)
BUN: 10 mg/dL (ref 6–20)
CO2: 28 mmol/L (ref 22–32)
Calcium: 8 mg/dL — ABNORMAL LOW (ref 8.9–10.3)
Chloride: 97 mmol/L — ABNORMAL LOW (ref 98–111)
Creatinine, Ser: 0.86 mg/dL (ref 0.61–1.24)
GFR calc Af Amer: >60 mL/min (ref >60)
GFR calc non Af Amer: >60 mL/min (ref >60)
Glucose, Bld: 151 mg/dL — ABNORMAL HIGH (ref 70–99)
Phosphorus: 4.8 mg/dL — ABNORMAL HIGH (ref 2.5–4.6)
Potassium: 3.9 mmol/L (ref 3.5–5.1)
Sodium: 137 mmol/L (ref 135–145)

## 2018-11-29 LAB — CK: Total CK: 62 U/L (ref 49–397)

## 2018-11-29 MED ORDER — MORPHINE SULFATE (PF) 2 MG/ML IV SOLN
INTRAVENOUS | Status: AC
  Filled 2018-11-29: qty 1

## 2018-11-29 MED ORDER — MORPHINE SULFATE (PF) 2 MG/ML IV SOLN
2.0000 mg | Freq: Once | INTRAVENOUS | Status: AC
  Administered 2018-11-29: 2 mg via INTRAVENOUS

## 2018-11-29 NOTE — Progress Notes (Signed)
[facility]/Trauma Progress Note  6 Days Post-Op   Assessment/Plan ?HCAP - per primary team Transaminitis - improving GERD HLD Bipolar disorder ADHD Insomnia Chronic back pain   Sigmoid stricture with perforated cecum S/P ex lap with resection of R colon and ileostomy, decompressive transverse colostomy, resection of sigmoid colon 11/23/18 Dr. Kieth Brightly - mobilize - pathology showed diverticulitis  - JP - 70 cc of ss output in 24 hrs, will likely remove prior to discharge  FEN: soft diet VTE: SCDs, SQ heparin ID: flagyl 2/3>>, cefepime 2/5-02/07; Omnicef & Flagyl 02/07-02/17    WBC improved, 13.5 Follow up: Dr. Kieth Brightly  Dispo: Monitor WBC.  Repeat tomorrow pt will need 10-14 days total of antibiotics. If WBC continues to trend down pt should be ready for discharge tomorrow.              LOS: 7 days    Subjective: CC: abdominal pain, improving   Pt is tolerating a diet and having bowel function. Wife at bedside. No issues overnight. Pt denies nausea, vomiting, fever or chills. Wife will do ostomy care today with WOC.    Objective: Vital signs in last 24 hours: Temp:  [98 F (36.7 C)-98.5 F (36.9 C)] 98 F (36.7 C) (02/10 0538) Pulse Rate:  [93-112] 107 (02/10 0538) Resp:  [16-18] 18 (02/10 0538) BP: (100-120)/(57-64) 112/63 (02/10 0538) SpO2:  [90 %-97 %] 90 % (02/10 0538) Last BM Date: 11/28/18  Intake/Output from previous day: 02/09 0701 - 02/10 0700 In: 600 [P.O.:600] Out: 1545 [Urine:775; Drains:70; Stool:700] Intake/Output this shift: No intake/output data recorded.  PE: Gen:  Alert, NAD, pleasant, cooperative Pulm: rate and effort normal Abd: Soft, ND, +BS, midline VAC with good seal, JP in LLQ with ss fluid, Colostomy pink with small amount brown stool, ileostomy red with brown/green stool, mild epigastric TTP without guarding. No peritonitis  Skin: no rashes noted, warm and dry   Anti-infectives: Anti-infectives (From  admission, onward)   Start     Dose/Rate Route Frequency Ordered Stop   11/26/18 1400  metroNIDAZOLE (FLAGYL) tablet 500 mg     500 mg Oral Every 8 hours 11/26/18 1221 12/06/18 1359   11/26/18 1230  cefdinir (OMNICEF) capsule 300 mg     300 mg Oral Every 12 hours 11/26/18 1221 12/06/18 0959   11/24/18 1400  ceFEPIme (MAXIPIME) 2 g in sodium chloride 0.9 % 100 mL IVPB  Status:  Discontinued     2 g 200 mL/hr over 30 Minutes Intravenous Every 8 hours 11/24/18 1219 11/26/18 1221   11/23/18 2000  cefTRIAXone (ROCEPHIN) 2 g in sodium chloride 0.9 % 100 mL IVPB  Status:  Discontinued     2 g 200 mL/hr over 30 Minutes Intravenous Every 24 hours 11/23/18 1424 11/24/18 1216   11/23/18 1000  ciprofloxacin (CIPRO) IVPB 400 mg     400 mg 200 mL/hr over 60 Minutes Intravenous  Once 11/23/18 0953 11/23/18 1409   11/23/18 0600  aztreonam (AZACTAM) 2 g in sodium chloride 0.9 % 100 mL IVPB  Status:  Discontinued     2 g 200 mL/hr over 30 Minutes Intravenous Every 8 hours 11/22/18 2046 11/23/18 1424   11/22/18 2030  aztreonam (AZACTAM) 2 g in sodium chloride 0.9 % 100 mL IVPB     2 g 200 mL/hr over 30 Minutes Intravenous  Once 11/22/18 2027 11/22/18 2131   11/22/18 2030  metroNIDAZOLE (FLAGYL) IVPB 500 mg  Status:  Discontinued     500 mg 100 mL/hr  over 60 Minutes Intravenous Every 8 hours 11/22/18 2027 11/26/18 1221      Lab Results:  Recent Labs    11/28/18 0524 11/29/18 0305  WBC 16.2* 13.5*  HGB 9.6* 9.5*  HCT 28.3* 29.5*  PLT 250 275   BMET Recent Labs    11/28/18 0524 11/29/18 0305  NA 136 137  K 3.3* 3.9  CL 97* 97*  CO2 29 28  GLUCOSE 171* 151*  BUN 8 10  CREATININE 0.84 0.86  CALCIUM 8.1* 8.0*   PT/INR No results for input(s): LABPROT, INR in the last 72 hours. CMP     Component Value Date/Time   NA 137 11/29/2018 0305   NA 139 10/04/2018 1520   K 3.9 11/29/2018 0305   CL 97 (L) 11/29/2018 0305   CO2 28 11/29/2018 0305   GLUCOSE 151 (H) 11/29/2018 0305   BUN 10  11/29/2018 0305   BUN 11 10/04/2018 1520   CREATININE 0.86 11/29/2018 0305   CREATININE 0.98 04/28/2016 1454   CALCIUM 8.0 (L) 11/29/2018 0305   PROT 5.9 (L) 11/28/2018 0524   PROT 7.4 10/04/2018 1520   ALBUMIN 1.9 (L) 11/29/2018 0305   ALBUMIN 4.5 10/04/2018 1520   AST 30 11/28/2018 0524   ALT 26 11/28/2018 0524   ALKPHOS 73 11/28/2018 0524   BILITOT 0.6 11/28/2018 0524   BILITOT <0.2 10/04/2018 1520   GFRNONAA >60 11/29/2018 0305   GFRNONAA >89 04/28/2016 1454   GFRAA >60 11/29/2018 0305   GFRAA >89 04/28/2016 1454   Lipase     Component Value Date/Time   LIPASE 21 11/22/2018 1634    Studies/Results: Dg Chest 2 View  Result Date: 11/27/2018 CLINICAL DATA:  Shortness of breath, leukocytosis. EXAM: CHEST - 2 VIEW COMPARISON:  Radiograph of November 23, 2018. FINDINGS: The heart size and mediastinal contours are within normal limits. No pneumothorax or pleural effusion is noted. Increased bilateral diffuse lung opacities are noted concerning for worsening pneumonia or possibly edema. The visualized skeletal structures are unremarkable. IMPRESSION: Increased bilateral diffuse lung opacities are noted concerning for worsening pneumonia or edema. Electronically Signed   By: Marijo Conception, M.D.   On: 11/27/2018 18:44      Kalman Drape , [facility] 11/29/2018, 9:26 AM  Pager: [phone] Mon-Wed, Friday 7:00am-4:30pm Thurs 7am-11:30am  Consults: [phone]

## 2018-11-29 NOTE — Progress Notes (Signed)
Family Medicine Teaching Service Daily Progress Note Intern Pager: [phone]  Patient name: [name] Medical record number: [id number] Date of birth: 25-Jan-1981 Age: 38 y.o. Gender: male  Primary Care Provider: Lovenia , MD Consultants: Dietician, Gen Surgery, WOC Code Status: Full Code   Pt Overview and Major Events to Date:  Hospital Day: 8  Admitted: 11/22/2018 for CC: Chest Pain  Assessment and Plan: [name] is a 38 y.o. male admitted for abdominal pain with free air on chest x-ray and diagnosed with large bowel perforation status post ex lap resulting in right partial colectomy, sigmoidectomy, terminal ileostomy, transverse colostomy.  His chronic conditions include prediabetes, HLD, depression, Bipolar, ADHD, chronic back pain, GERD, insomnia.   #Cecal perforation secondary to diverticulitis  #S/P ex lap & right partial colectomy and sigmoidectomy, terminal ileostomy, and decompressive transverse colostomy  POD 6 3 doses of oxy $Remo'5mg'dJCpu$  overnight   Plans per surgery Recs  Follow JP drain- possible removal prior to d/c  Continue cefdinir (2/7-2/20) and Flagyl (2/3-2/16) 14 days total each  Pain control: Scheduled Tylenol q 6 hours, Oxycodone 2-1/2 mg - 5 mg for moderate - severe breakthrough pain respectively  Possible D/C tomorrow pending wound care education   # Acute leukocytosis  improving  13.5 today   If fever or worsening abdominal pain  CT abdomen  Discontinue cefdinir and Flagyl and begin Zosyn.  Blood cultures x2  #Hypoxemia  Weaned to 2L today. S/p lasix x 1. Lungs sounds clear today.   Attempt to wean O2   Ambulate patient with pulse ox to prep for d/c   #Chronic hematuria   Follow up o/p with urology   #Diabetes CBGs 150's   Continue to sensitive sliding scale  CBG 4x daily   Will need continued o/p follow up  # Hx Beckers MD  Will need outpatient follow up with neurology   # HLD  Continue home lipitor 40 mg   #  Bipolar  Continue home latuda and viibryd    # ADHD  Vyvanse $Remove'50mg'ysTjiAw$  breakfast & $RemoveBef'20mg'wvZywXjrwv$  at lunch.   Discontinue?   # Chronic Back Pain   Continue home gabapentin    # GERD  PO PTX   # Insomnia  Home Trazadone $RemoveBefore'50mg'eliZkYlwXNWyF$  QHS   # FEN . No current infusions . Hypokalemia 3.2, 42mEq KDUR BID . Carb modified  # DVT Prophylaxis . heparin 5,000 units q8hrs   # Disposition . Trending Labs: CBC, BMP  . Discharge to home with home health, ostomy education and gen surgery, ambulation with pulse ox  Subjective  Patient reports doing well overnight. No acute events. Pain improved with sitting up   Objective   Vital Signs Intake/Output  Temp:  [98 F (36.7 C)-98.5 F (36.9 C)] 98 F (36.7 C) (02/10 0538) Pulse Rate:  [93-112] 107 (02/10 0538) Resp:  [16-18] 18 (02/10 0538) BP: (100-120)/(57-64) 112/63 (02/10 0538) SpO2:  [90 %-97 %] 90 % (02/10 0538)  Filed Weights   11/22/18 1519  Weight: 112.5 kg   Intake/Output      02/09 0701 - 02/10 0700 02/10 0701 - 02/11 0700   P.O. 600    Total Intake(mL/kg) 600 (5.3)    Urine (mL/kg/hr) 775 (0.3)    Drains 70    Stool 700    Total Output 1545    Net -945            Physical Exam Gen: NAD, alert, non-toxic, lying in bed.  HEENT: NCAT.  MMM.  CV: 1/6 systolic ejection murmur. No BLEE  Resp: No crackles bilaterally, no diminished sounds. On RA and sats at 87-88%.  Abd: Wound vac with good seal. Terminal ileostomy site w/ brownish collection in bag. Colostomy site with brownish collection in bag, though less volume than ileostomy site.  JP drain with serosanguinous fluid.  Extremities: moves extremities spontaneously. Warm and well perfused.   Laboratory: Recent Labs  Lab 11/22/18 1549 11/28/18 0524 11/29/18 0305  WBC 9.8 16.2* 13.5*  HGB 14.8 9.6* 9.5*  HCT 42.2 28.3* 29.5*  PLT 239 250 275   Recent Labs  Lab 11/22/18 1634 11/28/18 0524 11/29/18 0305  NA  --  136 137  K  --  3.3* 3.9  CL  --  97* 97*  CO2  --  29  28  BUN  --  8 10  CREATININE  --  0.84 0.86  CALCIUM  --  8.1* 8.0*  PROT 7.6 5.9*  --   BILITOT 0.9 0.6  --   ALKPHOS 67 73  --   ALT 35 26  --   AST 43* 30  --   GLUCOSE  --  171* 151*    Imaging/Diagnostic Tests: Dg Chest 2 View  Result Date: 11/27/2018 CLINICAL DATA:  Shortness of breath, leukocytosis. EXAM: CHEST - 2 VIEW COMPARISON:  Radiograph of November 23, 2018. FINDINGS: The heart size and mediastinal contours are within normal limits. No pneumothorax or pleural effusion is noted. Increased bilateral diffuse lung opacities are noted concerning for worsening pneumonia or possibly edema. The visualized skeletal structures are unremarkable. IMPRESSION: Increased bilateral diffuse lung opacities are noted concerning for worsening pneumonia or edema. Electronically Signed   By: Marijo Conception, M.D.   On: 11/27/2018 18:44    Wilber Oliphant, MD 11/29/2018, 8:14 AM PGY-1, [place] Intern pager: [phone], text pages welcome

## 2018-11-29 NOTE — Progress Notes (Signed)
RT set up CPAP and placed on patient with 2L O2 bled into circuit. Patient tolerating well at this time. RT will monitor as needed.

## 2018-11-29 NOTE — Progress Notes (Signed)
Physical Therapy Treatment Patient Details Name: [name] MRN: [id number] DOB: 07/10/1981 Today's Date: 11/29/2018    History of Present Illness 38 y.o. male presenting with abdominal pain concerning for bowel obstruction. 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 Kieth Brightly    PT Comments    Pt willing to work with therapy to perform stair training for d/c home tomorrow. Pt is making progress towards his goals however is limited in safe mobility by oxygen desaturation (see General Comments) and generalized deconditioning. Pt requires mod I for bed mobility and min A for transfers, ambulation and ascent/descent of 3 steps. Pt educated that given his instability and DoE, pt would be safest with staying on his main level despite bedroom and bathroom being upstairs.     Follow Up Recommendations  Home health PT     Equipment Recommendations  3in1 (PT);Rolling walker with 5" wheels    Recommendations for Other Services       Precautions / Restrictions Precautions Precautions: None Precaution Comments: colostomy, JP drain and wound vac Restrictions Weight Bearing Restrictions: No    Mobility  Bed Mobility Overal bed mobility: Modified Independent(+ bed rails)             General bed mobility comments: HOB up and use of rail  Transfers Overall transfer level: Needs assistance Equipment used: Rolling walker (2 wheeled) Transfers: Sit to/from Stand Sit to Stand: Min assist(for  dizziness)         General transfer comment: minA for power up, pt pulls against therapist arm to come to standing  Ambulation/Gait Ambulation/Gait assistance: Min guard;Min assist Gait Distance (Feet): 150 Feet Assistive device: Rolling walker (2 wheeled) Gait Pattern/deviations: Step-through pattern;Decreased step length - right;Decreased step length - left;Narrow base of support;Trunk flexed Gait velocity: slowed Gait velocity interpretation: 1.31 - 2.62 ft/sec, indicative of limited community ambulator General Gait Details: min guard for initial 75 feet of ambulation vc for proximity to RW and upright posture, requires seated rest break for 3/4 Doe and O2 desaturation, once recovered performed stair training and required additional seated rest break before returning to room   Stairs Stairs: Yes Stairs assistance: Min assist Stair Management: One rail Left;Sideways;Step to pattern Number of Stairs: 3 General stair comments: pt requires minA for steadying with sideways step to pattern to ascend/descend 3 steps maximal vc for foot placement        Balance Overall balance assessment: Mild deficits observed, not formally tested;Needs assistance Sitting-balance support: Feet supported;No upper extremity supported Sitting balance-Leahy Scale: Fair     Standing balance support: Bilateral upper extremity supported Standing balance-Leahy Scale: Poor Standing balance comment: requires UE support                            Cognition Arousal/Alertness: Lethargic Behavior During Therapy: Flat affect;WFL for tasks assessed/performed Overall Cognitive Status: Within Functional Limits for tasks assessed  General Comments General comments (skin integrity, edema, etc.): Pt on 2L O2 on entry with SaO2 94%O2, on RA with coming to seated EoB SaO2 dropped to 84%O2 and HR rose to 109 bpm, with ambulation on 2L O2 via [place]  SaO2 dropped to 86%O2, once rebounded to 90%O2 with pursed lipped breathing began stair training. After ascent/descent of 3 steps SaO2 dropped to 79%O2 HR 120 bpm, with vc for pursed lipped breathing SaO2 88%O2, supplemental O2 increased to 3L and SaO2 rebounded to 92%O2 HR 125 bpm. With ambulation back to room SaO2 maintained at 90%O2 RN notified regarding O2 desaturation      Pertinent Vitals/Pain Pain Assessment: 0-10 Pain Score: 7  Pain Location: abdomen Pain Descriptors / Indicators: Discomfort Pain Intervention(s): Limited activity within patient's tolerance;Monitored during session;Premedicated before session;Repositioned           PT Goals (current goals can now be found in the care plan section) Acute Rehab PT Goals Patient Stated Goal: to feel better PT Goal Formulation: With patient Time For Goal Achievement: 12/10/18 Potential to Achieve Goals: Fair    Frequency    Min 3X/week      PT Plan Current plan remains appropriate       AM-PAC PT "6 Clicks" Mobility   Outcome Measure  Help needed turning from your back to your side while in a flat bed without using bedrails?: A Little Help needed moving from lying on your back to sitting on the side of a flat bed without using bedrails?: A Little Help needed moving to and from a bed to a chair (including a wheelchair)?: A Little Help needed standing up from a chair using your arms (e.g., wheelchair or bedside chair)?: A Little Help needed to walk in hospital room?: A Little Help needed climbing 3-5 steps with a railing? : A Lot 6 Click Score: 17    End of Session Equipment Utilized During Treatment: Gait belt Activity Tolerance: Patient limited by lethargy Patient left: in bed;with call bell/phone within reach;with family/visitor present(seated EoB eating lunch) Nurse Communication: Mobility status PT Visit Diagnosis: Unsteadiness on feet (R26.81);Other abnormalities of gait and mobility (R26.89);Muscle weakness  (generalized) (M62.81);Difficulty in walking, not elsewhere classified (R26.2);Dizziness and giddiness (R42);Pain Pain - part of body: (abdomen)     Time: 4742-5956 PT Time Calculation (min) (ACUTE ONLY): 32 min  Charges:  $Gait Training: 23-37 mins                     Dahir Ayer B. Migdalia Dk PT, DPT Acute Rehabilitation Services Pager [phone] Office [phone]    [place] 11/29/2018, 3:50 PM

## 2018-11-29 NOTE — Progress Notes (Signed)
Occupational Therapy Treatment Patient Details Name: [name] MRN: [id number] DOB: [birth year] Today's Date: 11/29/2018    History of present illness 38 y.o. male presenting with abdominal pain concerning for bowel obstruction. 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 Kieth Brightly   OT comments  This 38 yo male admitted and underwent above presents to acute OT with education completed on AE for LBADLs and equipment to be given to pt later today. He will continue to benefit from acute OT without need for follow up.   Follow Up Recommendations  No OT follow up;Supervision - Intermittent    Equipment Recommendations  3 in 1 bedside commode;Tub/shower seat(pt has medicaid 2nd so should be able to get tub seat)       Precautions / Restrictions Precautions Precaution Comments: colostomy, JP drain and wound vac Restrictions Weight Bearing Restrictions: No       Mobility Bed Mobility Overal bed mobility: Modified Independent             General bed mobility comments: HOB up and use of rail  Transfers Overall transfer level: Needs assistance Equipment used: Rolling walker (2 wheeled) Transfers: Sit to/from Stand Sit to Stand: Supervision              Balance Overall balance assessment: Mild deficits observed, not formally tested                                         ADL either performed or assessed with clinical judgement   ADL Overall ADL's : Needs  assistance/impaired                                       General ADL Comments: pt is setup/S (sit<>stand) for basic ADL at side of bed, unable to do figure 4 technique for LB dressing due to pain and discomfort in abdomen. Educated pt on use of AE for LBADLs and a rehab tech will bring AE  (reacher, extra long white shoe horn, long sponge, wide sock aid, and 2 pair of black elastic laces) to him later today Pt able to use reacher to doff socks and don underwear, sock aid to donn socks. I explained how long sponge would A with bathing as well as showed him extra long shoe horn. Explained to him and family how to use elastic shoe laces in his tennis and work shoes.     Vision Patient Visual Report: No change from baseline            Cognition Arousal/Alertness: Awake/alert Behavior During Therapy: WFL for tasks assessed/performed Overall Cognitive Status: Within Functional Limits for tasks assessed  Pertinent Vitals/ Pain       Pain Assessment: 0-10 Pain Score: 7  Pain Location: abdomen Pain Descriptors / Indicators: Discomfort;Sore Pain Intervention(s): Limited activity within patient's tolerance;Monitored during session;Repositioned(not time for pain meds per RN)         Frequency  Min 2X/week        Progress Toward Goals  OT Goals(current goals can now be found in the care plan section)  Progress towards OT goals: Progressing toward goals     Plan Discharge plan remains appropriate       AM-PAC OT "6 Clicks" Daily Activity     Outcome Measure   Help from another person eating meals?: None Help from another person taking care of personal grooming?: A Little Help from another person toileting, which includes using toliet, bedpan, or urinal?: A Little Help from another person bathing (including washing, rinsing, drying)?: A Little Help from another person to put on and taking  off regular upper body clothing?: A Little Help from another person to put on and taking off regular lower body clothing?: A Little 6 Click Score: 19    End of Session Equipment Utilized During Treatment: Rolling walker  OT Visit Diagnosis: Unsteadiness on feet (R26.81);Muscle weakness (generalized) (M62.81);Pain Pain - part of body: (abdomen)   Activity Tolerance Patient tolerated treatment well   Patient Left (standing at beside with urinal and family A)   Nurse Communication Patient requests pain meds        Time: 1039-1105 OT Time Calculation (min): 26 min  Charges: OT General Charges $OT Visit: 1 Visit OT Treatments $Self Care/Home Management : 23-37 mins  [place], OTR/L Acute [employer] Pager [phone] Office [phone]      Almon Register 11/29/2018, 11:23 AM

## 2018-11-29 NOTE — Consult Note (Signed)
[place] Nurse wound follow up Wound type:Midline surgical wound and NPWT (VAC) therapy as well as colostomy and ileostomy.  Wife is at bedside and participates in ostomy teaching and care today.  Patient is given 1 x dose Morphine by bedside RN  for wound care due to pain.  Measurement: 21 cm x 5 cm x 4 cm  Wound bed: Beefy red, bleeds with dressing change.  CCS at bedside to observe wound Drainage (amount, consistency, odor) Moderate bloody drainage in canister.  Also bleeds with wound care today.   Periwound: 2 ostomies1 in RUQ and 1 in RLQ  Dressing procedure/placement/frequency: CleANSE midline wound with NS and pat dry. Apply 1 piece black foam.  Barrier ring to distal end of wound to promote seal.  [facility]OC Nurse ostomy follow up Stoma type/location: RUQ flush transverse colostomy   Added convexity RMQ ileostomy, well budded Stomal assessment/size:  RUQ: 2 " flush RMQ: 1 1/4" well budded.  Peristomal assessment: intact  Treatment options for stomal/peristomal skin:  Convex 1 piece pouch to flush stoma- 2 piece pouch to ileostomy  Output Liquid green from ileostomy Scant soft from colostomy Ostomy pouching: 1pc. Colostomy  2pc. ileostomy Education provided: Wife at beside and cuts barrier to fit and applies both pouches today.  Explained rationale for convex and flat and 1 vs 2 piece for flush and budded stomas.  Twice weekly pouch changes.  3 pouch sets in the room for discharge.  Home with Home health.   Enrolled patient in [place] Start Discharge program: Yes

## 2018-11-30 ENCOUNTER — Inpatient Hospital Stay (HOSPITAL_COMMUNITY): Payer: Medicare Other

## 2018-11-30 DIAGNOSIS — J811 Chronic pulmonary edema: Secondary | ICD-10-CM

## 2018-11-30 DIAGNOSIS — R0902 Hypoxemia: Secondary | ICD-10-CM | POA: Diagnosis not present

## 2018-11-30 DIAGNOSIS — J9601 Acute respiratory failure with hypoxia: Secondary | ICD-10-CM

## 2018-11-30 LAB — BASIC METABOLIC PANEL
Anion gap: 11 (ref 5–15)
BUN: 11 mg/dL (ref 6–20)
CHLORIDE: 95 mmol/L — AB (ref 98–111)
CO2: 28 mmol/L (ref 22–32)
Calcium: 7.9 mg/dL — ABNORMAL LOW (ref 8.9–10.3)
Creatinine, Ser: 0.79 mg/dL (ref 0.61–1.24)
GFR calc Af Amer: >60 mL/min (ref >60)
GFR calc non Af Amer: >60 mL/min (ref >60)
Glucose, Bld: 151 mg/dL — ABNORMAL HIGH (ref 70–99)
Potassium: 3.7 mmol/L (ref 3.5–5.1)
SODIUM: 134 mmol/L — AB (ref 135–145)

## 2018-11-30 LAB — CBC
HCT: 28.9 % — ABNORMAL LOW (ref 39.0–52.0)
HEMOGLOBIN: 9.2 g/dL — AB (ref 13.0–17.0)
MCH: 27.3 pg (ref 26.0–34.0)
MCHC: 31.8 g/dL (ref 30.0–36.0)
MCV: 85.8 fL (ref 80.0–100.0)
Platelets: 307 10*3/uL (ref 150–400)
RBC: 3.37 MIL/uL — ABNORMAL LOW (ref 4.22–5.81)
RDW: 14 % (ref 11.5–15.5)
WBC: 13.7 10*3/uL — ABNORMAL HIGH (ref 4.0–10.5)
nRBC: 0 % (ref 0.0–0.2)

## 2018-11-30 LAB — GLUCOSE, CAPILLARY
GLUCOSE-CAPILLARY: 160 mg/dL — AB (ref 70–99)
Glucose-Capillary: 178 mg/dL — ABNORMAL HIGH (ref 70–99)

## 2018-11-30 MED ORDER — FUROSEMIDE 10 MG/ML IJ SOLN
20.0000 mg | Freq: Once | INTRAMUSCULAR | Status: AC
  Administered 2018-11-30: 20 mg via INTRAVENOUS
  Filled 2018-11-30: qty 2

## 2018-11-30 MED ORDER — CEFDINIR 300 MG PO CAPS: 300.0000 mg | ORAL_CAPSULE | Freq: Two times a day (BID) | ORAL | 0 refills | Status: AC

## 2018-11-30 MED ORDER — ENSURE ENLIVE PO LIQD: 237.0000 mL | Freq: Two times a day (BID) | ORAL | 12 refills | Status: DC

## 2018-11-30 MED ORDER — METRONIDAZOLE 500 MG PO TABS: 500.0000 mg | ORAL_TABLET | Freq: Three times a day (TID) | ORAL | 0 refills | Status: AC

## 2018-11-30 MED ORDER — ADULT MULTIVITAMIN W/MINERALS CH: 1.0000 | ORAL_TABLET | Freq: Every day | ORAL | 0 refills | Status: DC

## 2018-11-30 NOTE — Progress Notes (Signed)
Patient claimed he's dizzy to ambulate, assisted back to the bed

## 2018-11-30 NOTE — Consult Note (Signed)
[facility] CM Primary Care Navigator  11/30/2018  Chez W [place] 08/06/1981 [id number]   Met withpatient, wife (Amy) and father (Lacy) at the bedside to identify possible discharge needs.  Per MD note, patient presented with worsening abdominal pain and found to have a large bowel obstruction that resulted to this admission/ surgery. (status post emergent exploratory laparoscopy, resection of sigmoid and right colon with placement of ileostomy and transverse colostomy and midline wound vac)  Patient endorsesDr. [facility]ashika [facility].   [facility] [facility] [place] [facility] [place].   Patientstatesthathemanageshis medications at home with wife's assistance using "pill box" system filled every 2 weeks.   Patientverbalizedthat he has been driving prior to admission/ surgery but his father will be able to provide transportation to his doctors' appointments after discharge.  Patientstates that his wife will be the primary caregiver at home.  Anticipated discharge plan is home with home health services per therapy recommendation.   Patient and wifevoiced understanding to call primary care provider's office for a post discharge follow-up appointment within1- 2 weeks orsooner if needs arise. Patient letter (with PCP's contact number) wasprovided asa reminder.   Discussed with patient and familyregarding [facility]-CM services available for health management andresourcesat home buthe deniesany needs orconcernsat this point.  Patient states thathis wife has been helping him manage his health needs at home so far.    Patientand wife expressed understanding of needto seekreferral from primary care provider to [facility] care management ifdeemed necessary and appropriate for any services in thenear future.  [facility] care management information was provided for  futureneeds thatpatientmay have.  Patienthowever,verbally agreedand optedfor EMMI calls to follow-up withhisrecovery at home.   Referral made for EMMI General calls after discharge.  Primary care provider's office is listed as providing transition of care (TOC).   For additional questions please contact:   A. , BSN, RN-BC [facility] PRIMARY CARE Navigator Cell: [phone] 

## 2018-11-30 NOTE — Progress Notes (Signed)
[facility] Progress Note  7 Days Post-Op   Assessment/Plan ?HCAP - per primary team Transaminitis - improving GERD HLD Bipolar disorder ADHD Insomnia Chronic back pain   Sigmoid stricture with perforated cecum S/P ex lap with resection of R colon and ileostomy, decompressive transverse colostomy, resection of sigmoid colon 11/23/18 Dr. Kieth Brightly - mobilize - pathology showed diverticulitis  - dc JP drain  FEN: soft diet VTE: SCDs, SQ heparin ID: flagyl 2/3>>, cefepime 2/5-02/07; Omnicef & Flagyl 02/07-02/17 WBC improved, 13.5 Follow up: Dr. Kieth Brightly  Dispo: pt is okay for discharge from a surgical standpoint. F/U in AVS.    LOS: 8 days    Subjective: CC: abdominal pain  Pt states no issues overnight. No fever, chills, nausea or vomiting. He states his pain continues to improve. He is ready to go home. Wife states she is comfortable helping with ostomy changes if needed.   Objective: Vital signs in last 24 hours: Temp:  [98.3 F (36.8 C)-98.7 F (37.1 C)] 98.3 F (36.8 C) (02/11 0973) Pulse Rate:  [90-97] 90 (02/11 0632) Resp:  [16-20] 18 (02/11 5329) BP: (108-121)/(60-67) 108/60 (02/11 0632) SpO2:  [95 %-98 %] 98 % (02/11 0807) Last BM Date: 11/28/18  Intake/Output from previous day: 02/10 0701 - 02/11 0700 In: 610 [P.O.:600] Out: 1475 [Urine:1075; Drains:20; Stool:380] Intake/Output this shift: No intake/output data recorded.  PE: Gen: Alert, NAD, pleasant, cooperative Pulm: rate and effort normal Abd: Soft, ND, +BS, midline VAC with good seal, JP in LLQ with ss fluid, Colostomy pink with scant amount brown stool, ileostomy red with brown/green stool, mild epigastric TTP without guarding. No peritonitis  Skin: no rashes noted, warm and dry  Anti-infectives: Anti-infectives (From admission, onward)   Start     Dose/Rate Route Frequency Ordered Stop   11/26/18 1400  metroNIDAZOLE (FLAGYL) tablet 500 mg     500 mg Oral Every 8  hours 11/26/18 1221 12/06/18 1359   11/26/18 1230  cefdinir (OMNICEF) capsule 300 mg     300 mg Oral Every 12 hours 11/26/18 1221 12/06/18 0959   11/24/18 1400  ceFEPIme (MAXIPIME) 2 g in sodium chloride 0.9 % 100 mL IVPB  Status:  Discontinued     2 g 200 mL/hr over 30 Minutes Intravenous Every 8 hours 11/24/18 1219 11/26/18 1221   11/23/18 2000  cefTRIAXone (ROCEPHIN) 2 g in sodium chloride 0.9 % 100 mL IVPB  Status:  Discontinued     2 g 200 mL/hr over 30 Minutes Intravenous Every 24 hours 11/23/18 1424 11/24/18 1216   11/23/18 1000  ciprofloxacin (CIPRO) IVPB 400 mg     400 mg 200 mL/hr over 60 Minutes Intravenous  Once 11/23/18 0953 11/23/18 1409   11/23/18 0600  aztreonam (AZACTAM) 2 g in sodium chloride 0.9 % 100 mL IVPB  Status:  Discontinued     2 g 200 mL/hr over 30 Minutes Intravenous Every 8 hours 11/22/18 2046 11/23/18 1424   11/22/18 2030  aztreonam (AZACTAM) 2 g in sodium chloride 0.9 % 100 mL IVPB     2 g 200 mL/hr over 30 Minutes Intravenous  Once 11/22/18 2027 11/22/18 2131   11/22/18 2030  metroNIDAZOLE (FLAGYL) IVPB 500 mg  Status:  Discontinued     500 mg 100 mL/hr over 60 Minutes Intravenous Every 8 hours 11/22/18 2027 11/26/18 1221      Lab Results:  Recent Labs    11/29/18 0305 11/30/18 0310  WBC 13.5* 13.7*  HGB 9.5* 9.2*  HCT 29.5* 28.9*  PLT 275 307   BMET Recent Labs    11/29/18 0305 11/30/18 0310  NA 137 134*  K 3.9 3.7  CL 97* 95*  CO2 28 28  GLUCOSE 151* 151*  BUN 10 11  CREATININE 0.86 0.79  CALCIUM 8.0* 7.9*   PT/INR No results for input(s): LABPROT, INR in the last 72 hours. CMP     Component Value Date/Time   NA 134 (L) 11/30/2018 0310   NA 139 10/04/2018 1520   K 3.7 11/30/2018 0310   CL 95 (L) 11/30/2018 0310   CO2 28 11/30/2018 0310   GLUCOSE 151 (H) 11/30/2018 0310   BUN 11 11/30/2018 0310   BUN 11 10/04/2018 1520   CREATININE 0.79 11/30/2018 0310   CREATININE 0.98 04/28/2016 1454   CALCIUM 7.9 (L) 11/30/2018 0310    PROT 5.9 (L) 11/28/2018 0524   PROT 7.4 10/04/2018 1520   ALBUMIN 1.9 (L) 11/29/2018 0305   ALBUMIN 4.5 10/04/2018 1520   AST 30 11/28/2018 0524   ALT 26 11/28/2018 0524   ALKPHOS 73 11/28/2018 0524   BILITOT 0.6 11/28/2018 0524   BILITOT <0.2 10/04/2018 1520   GFRNONAA >60 11/30/2018 0310   GFRNONAA >89 04/28/2016 1454   GFRAA >60 11/30/2018 0310   GFRAA >89 04/28/2016 1454   Lipase     Component Value Date/Time   LIPASE 21 11/22/2018 1634    Studies/Results: No results found.    Kalman Drape , [facility] 11/30/2018, 9:47 AM  Pager: [phone] Mon-Wed, Friday 7:00am-4:30pm Thurs 7am-11:30am  Consults: [phone]

## 2018-11-30 NOTE — Care Management Note (Addendum)
Case Management Note  Patient Details  Name: [name] MRN: [id number] Date of Birth: [birth year]  Subjective/Objective:                    Action/Plan:Ordered home oxygen through [place] with [facility], however, liter flow needs to be added to order. MD will add liter flow to oxygen. Left message for DAn with [facility] to change negative pressure wound system  Will need ambulatory saturation note. If qualifies for home oxygen will order through [facility]. Jermaine with [facility] aware and waiting on note.[facility] will bring oxygen to patient's room and explain insurance coverage / payment.  Asked nurse to complete note. Patient aware.  Ordered shower stool, walker and bedside commode through [facility] with [facility] Expected Discharge Date:                  Expected Discharge Plan:  [place]  In-House Referral:     Discharge planning Services  CM Consult  Post Acute Care Choice:  Home Health Choice offered to:  Patient  DME Arranged:  Vac, Oxygen DME Agency:  [place]:  RN [facility] Agency:  [address]  Status of Service:  In process, will continue to follow  If discussed at Long Length of Stay Meetings, dates discussed:    Additional Comments:  Marilu Favre, RN 11/30/2018, 1:48 PM

## 2018-11-30 NOTE — Progress Notes (Signed)
SATURATION QUALIFICATIONS: (This note is used to comply with regulatory documentation for home oxygen)  Patient Saturations on Room Air at Rest =93  Patient Saturations on Room Air while Ambulating =88  Patient Saturations on 2Liters of oxygen while Ambulating = 92  Please briefly explain why patient needs home oxygen:

## 2018-11-30 NOTE — Progress Notes (Signed)
Discharged home today with wife and son accompanying patient.[facility] RN at bedside and changed wound vac to portable one. BSC and walker in room to take home by patient.Instructions given not to smoke if patient using  Oxygen. Verbalized understanding of instructions

## 2018-11-30 NOTE — Discharge Instructions (Signed)
DO NOT SMOKE WHILE USING YOU OXYGEN. OXYGEN IS HIGHLY FLAMMABLE AND A FIRE COULD EASILY START.   POST-HOSPITAL & CARE INSTRUCTIONS 1. Please use oxygen if you are feeling short of breath.  2. Please continue to take the two antibiotics  3. Keflex through 12/09/18  4. Flagyl through 2/16 5. Please let PCP/Specialists know of any changes that were made.  6. Please see medications section of this packet for any medication changes.   DOCTOR'S APPOINTMENT & FOLLOW UP CARE INSTRUCTIONS  Future Appointments  Date Time Provider [place]  12/07/2018  3:10 PM Lovenia , MD [facility] Follow up.   Contact information: [address] [place] [address] [phone]        Kinsinger, Arta Bruce, MD. Go on 12/15/2018.   Specialty:  General Surgery Why:  at 2:30pm. Please arrive 20 minutes prior to complete paperwork. Please bring photo ID and insurance card.  Contact information: [place] [address] [phone]            Thank you for choosing [facility]! Take care and be well!  [facility]  [place], [place] [address] [phone]    =============================================================================================================  [facility], [place] [phone]  OPEN ABDOMINAL SURGERY: POST OP INSTRUCTIONS  Always review your discharge instruction sheet given to you by the facility where your surgery was performed.  IF YOU HAVE DISABILITY OR FAMILY LEAVE FORMS, YOU MUST BRING THEM TO THE OFFICE FOR PROCESSING.  PLEASE DO NOT GIVE THEM TO YOUR DOCTOR.  1. A prescription for pain medication may be given to you upon discharge.  Take your pain medication as prescribed, if needed.  If narcotic pain medicine is not needed, then you may  take acetaminophen (Tylenol) or ibuprofen (Advil) as needed. 2. Take your usually prescribed medications unless otherwise directed. 3. If you need a refill on your pain medication, please contact your pharmacy. They will contact our office to request authorization.  Prescriptions will not be filled after 5pm or on week-ends. 4. You should follow a high-fiber, low fat diet.   Be sure to include lots of fluids daily. Most patients will experience some swelling and bruising on the chest and neck area.  Ice packs will help.  Swelling and bruising can take several days to resolve 5. Most patients will experience some swelling and bruising in the area of the incision. Ice pack will help. Swelling and bruising can take several days to resolve..  6. Be sure to measure your ostomy output. If you have >1.5L per day you may need to take immodium. Please call our office if you have stool output >1.5L per day. Be sure to stay hydrated with plenty of water, at least eight 8oz glasses per day.  7. A home health nurse will come help with your wound vac changes.  8. ACTIVITIES:  You may resume regular (light) daily activities beginning the next day--such as daily self-care, walking, climbing stairs--gradually increasing activities as tolerated.  You may have sexual intercourse when it is comfortable.  Refrain from any heavy lifting or straining until approved by your doctor. a. You may drive when you no longer are taking prescription pain medication, you can comfortably wear a seatbelt, and you can safely maneuver your car and apply brakes  b. Return to Work: ___________________________________ 2. You should see your doctor in the office for a follow-up appointment approximately two weeks after your surgery.  Make sure that you call for this appointment within a day or two after you arrive home to insure a convenient appointment time. OTHER INSTRUCTIONS:   _____________________________________________________________ _____________________________________________________________  WHEN TO CALL YOUR DOCTOR: 1. Fever over 101.0 2. Inability to urinate 3. Nausea and/or vomiting 4. Extreme swelling or bruising 5. Continued bleeding from incision. 6. Increased pain, redness, or drainage from the incision. 7. Ileostomy output >1.5L per day 8. Difficulty swallowing or breathing 9. Muscle cramping or spasms. 10. Numbness or tingling in hands or feet or around lips.  The clinic staff is available to answer your questions during regular business hours.  Please dont hesitate to call and ask to speak to one of the nurses if you have concerns.  For further questions, please visit [email or web address]     Diverticulitis  Diverticulitis is infection or inflammation of small pouches (diverticula) in the colon that form due to a condition called diverticulosis. Diverticula can trap stool (feces) and bacteria, causing infection and inflammation. Diverticulitis may cause severe stomach pain and diarrhea. It may lead to tissue damage in the colon that causes bleeding. The diverticula may also burst (rupture) and cause infected stool to enter other areas of the abdomen. Complications of diverticulitis can include:  Bleeding.  Severe infection.  Severe pain.  Rupture (perforation) of the colon.  Blockage (obstruction) of the colon. What are the causes? This condition is caused by stool becoming trapped in the diverticula, which allows bacteria to grow in the diverticula. This leads to inflammation and infection. What increases the risk? You are more likely to develop this condition if:  You have diverticulosis. The risk for diverticulosis increases if: ? You are overweight or obese. ? You use tobacco products. ? You do not get enough exercise.  You eat a diet that does not include enough fiber. High-fiber foods include fruits,  vegetables, beans, nuts, and whole grains. What are the signs or symptoms? Symptoms of this condition may include:  Pain and tenderness in the abdomen. The pain is normally located on the left side of the abdomen, but it may occur in other areas.  Fever and chills.  Bloating.  Cramping.  Nausea.  Vomiting.  Changes in bowel routines.  Blood in your stool. How is this diagnosed? This condition is diagnosed based on:  Your medical history.  A physical exam.  Tests to make sure there is nothing else causing your condition. These tests may include: ? Blood tests. ? Urine tests. ? Imaging tests of the abdomen, including X-rays, ultrasounds, MRIs, or CT scans. How is this treated? Most cases of this condition are mild and can be treated at home. Treatment may include:  Taking over-the-counter pain medicines.  Following a clear liquid diet.  Taking antibiotic medicines by mouth.  Rest. More severe cases may need to be treated at a hospital. Treatment may include:  Not eating or drinking.  Taking prescription pain medicine.  Receiving antibiotic medicines through an IV tube.  Receiving fluids and nutrition through an IV tube.  Surgery. When your condition is under control, your health care provider may recommend that you have a colonoscopy. This is an exam to look at the entire large intestine. During the exam, a lubricated, bendable tube is inserted into the anus and then passed into the rectum, colon, and other parts of the large intestine. A colonoscopy  can show how severe your diverticula are and whether something else may be causing your symptoms. Follow these instructions at home: Medicines  Take over-the-counter and prescription medicines only as told by your health care provider. These include fiber supplements, probiotics, and stool softeners.  If you were prescribed an antibiotic medicine, take it as told by your health care provider. Do not stop taking the  antibiotic even if you start to feel better.  Do not drive or use heavy machinery while taking prescription pain medicine. General instructions   Follow a full liquid diet or another diet as directed by your health care provider. After your symptoms improve, your health care provider may tell you to change your diet. He or she may recommend that you eat a diet that contains at least 25 g (25 grams) of fiber daily. Fiber makes it easier to pass stool. Healthy sources of fiber include: ? Berries. One cup contains 4-8 grams of fiber. ? Beans or lentils. One half cup contains 5-8 grams of fiber. ? Green vegetables. One cup contains 4 grams of fiber.  Exercise for at least 30 minutes, 3 times each week. You should exercise hard enough to raise your heart rate and break a sweat.  Keep all follow-up visits as told by your health care provider. This is important. You may need a colonoscopy. Contact a health care provider if:  Your pain does not improve.  You have a hard time drinking or eating food.  Your bowel movements do not return to normal. Get help right away if:  Your pain gets worse.  Your symptoms do not get better with treatment.  Your symptoms suddenly get worse.  You have a fever.  You vomit more than one time.  You have stools that are bloody, black, or tarry. Summary  Diverticulitis is infection or inflammation of small pouches (diverticula) in the colon that form due to a condition called diverticulosis. Diverticula can trap stool (feces) and bacteria, causing infection and inflammation.  You are at higher risk for this condition if you have diverticulosis and you eat a diet that does not include enough fiber.  Most cases of this condition are mild and can be treated at home. More severe cases may need to be treated at a hospital.  When your condition is under control, your health care provider may recommend that you have an exam called a colonoscopy. This exam can  show how severe your diverticula are and whether something else may be causing your symptoms. This information is not intended to replace advice given to you by your health care provider. Make sure you discuss any questions you have with your health care provider. Document Released: 07/16/2005 Document Revised: 11/08/2016 Document Reviewed: 11/08/2016 Elsevier Interactive Patient Education  2019 [employer]  An ileostomy is a surgical procedure to make an opening (stoma) for stool to leave your body. The surgery is done when a medical condition prevents stool from passing through the intestines and leaving your body through the rectum. During the surgery, a part of the small intestine (ileum) is attached to the stoma made in the abdominal wall. A bag or pouch is fitted over the stoma. Stool and gas will collect in the pouch. After having this surgery, you will need to empty and change your ileostomy pouch as needed. You will also need to take steps to care for the stoma. How do I care for my stoma? Your stoma should look pink  and moist. At first, the stoma may be swollen, but this swelling will go away within 6 weeks. To care for the stoma:  Keep the skin around the stoma clean and dry.  Use a clean, soft washcloth to gently wash the stoma and the skin around it.  Use stoma powder or ointment on your skin only as told by your health care provider.  If your skin becomes irritated, change your ileostomy pouch. The irritation may indicate that the pouch is leaking.  Measure the stoma opening regularly and record the size. Watch for changes. Share the information with your health care provider.  Check your stoma area every day for signs of infection. Check for: ? More redness, swelling, or pain. ? More fluid or blood. ? Pus or warmth. How do I care for my ileostomy pouch? The pouch that fits over the stoma can have either one or two pieces.  One-piece pouch: The  skin barrier and the pouch are combined in one unit.  Two-piece pouch: The skin barrier and the pouch are separate pieces that attach to each other. Empty your pouch when it is one-third to one-half full. Do not let more stool or gas build up. This could cause the pouch to leak. Some pouches have a built-in gas release valve. Change your pouch every 3-4 days for the first 6 weeks, and then every 5-7 days. You should also change the pouch right away if your skin near the stoma looks irritated. How do I empty my ileostomy pouch? You will be taught how to empty your pouch before you leave the hospital. Basic steps include: 1. Wash your hands with soap and water. 2. Sit far back on the toilet. 3. Put pieces of toilet paper into the toilet water. This will prevent splashing as you empty the stool into the toilet bowl. 4. Unclip the tail end of the pouch or separate the hook-and-loop fastener at the end of the pouch. 5. Unroll the tail and empty stool into the toilet. 6. Clean the tail with toilet paper. 7. Reroll the tail, and clip it closed or press the hook-and-loop fastener pieces together. 8. Wash your hands again. How do I change my ileostomy pouch? You will be taught how to change the pouch before you leave the hospital. Basic steps include: 1. Lay out your supplies. 2. Wash your hands with soap and water. 3. Carefully remove the old pouch. 4. Wash the stoma and the skin around the stoma. Allow the area to dry. Men may be told to carefully shave any hair around the stoma. 5. Use the stoma measuring guide that comes with your pouch set to decide what size hole you will need to cut in the skin barrier piece. Choose the smallest possible size that will hold the stoma but will not touch it. 6. Use the guide to trace the circle on the back of the skin barrier piece. 7. Cut out the hole. 8. Hold the skin barrier piece over the stoma to make sure the hole is the correct size. 9. Remove the  adhesive paper backing from the skin barrier piece. 10. Squeeze stoma paste around the opening of the skin barrier piece. 11. Clean and dry the skin around the stoma again. 12. Carefully fit the skin barrier piece over your stoma. 13. If you are using a two-piece pouch, snap the pouch onto the skin barrier piece. 14. Close the tail of the pouch. 15. Put your hand over the top of the skin  barrier piece to help warm it for about 5 minutes. This helps it conform to your body better. 9. Wash your hands again. What are some general tips?  Avoid wearing clothes that are tight directly over your stoma.  You can shower with or without the pouch in place. Do not use harsh or oily soaps or lotions. Always keep the pouch on if you are taking a bath or swimming.  If your pouch gets wet, you can dry it with a hair dryer on the cool setting.  Whenever you leave home, take an extra skin barrier and pouch with you.  Store all supplies in a cool, dry place. Do not leave supplies in extreme heat because parts can melt.  To prevent odor, you can put drops of ostomy deodorizer in the pouch. Your health care provider may also recommend putting ostomy lubricant inside the pouch. This helps the stool to slide out of the pouch more easily and completely.  Return to your normal activities as told by your health care provider. Ask your health care provider what activities are safe for you. Contact a health care provider if:  You have more redness, swelling, or pain around your stoma.  You have more fluid or blood coming from your stoma.  Your stoma feels warm to the touch.  You have pus coming from your stoma.  Your stoma extends in or out farther than normal.  Your stoma becomes purple, black, or pale white.  You need to change the pouch every day.  You have a fever.  You have abdominal pain, nausea, or bloating.  No stool is passing from the stoma.  You have diarrhea, requiring you to empty the  pouch more frequently than normal. Get help right away if:  Your stool is bloody.  You vomit.  You have trouble breathing.  You feel dizzy or light-headed. This information is not intended to replace advice given to you by your health care provider. Make sure you discuss any questions you have with your health care provider. Document Released: 10/09/2003 Document Revised: 12/05/2016 Document Reviewed: 06/19/2015 Elsevier Interactive Patient Education  2019 [employer].

## 2018-11-30 NOTE — Progress Notes (Signed)
Nutrition Follow-up  DOCUMENTATION CODES:   Obesity unspecified  INTERVENTION:   -Continue Ensure Enlive po BID, each supplement provides 350 kcal and 20 grams of protein -Double protein portions with all meals -Continue MVI with minerals daily  NUTRITION DIAGNOSIS:   Increased nutrient needs related to post-op healing, wound healing as evidenced by estimated needs.  Ongoing  GOAL:   Patient will meet greater than or equal to 90% of their needs  Progressing  MONITOR:   PO intake, Supplement acceptance, Labs, Weight trends, Skin, I & O's  REASON FOR ASSESSMENT:   Malnutrition Screening Tool, Consult Assessment of nutrition requirement/status  ASSESSMENT:   [name] is a 38 y.o. male presenting with abdominal pain concerning for bowel obstruction. 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   Case discussed with RN, who reports pt is eating well, but has not yet consumed Ensure supplement that was delivered to room earlier (RD noted pt wife opening up container to provide to pt later on). Pt is eating very well and wife has been educated on colostomy and wound care. Pt may discharge home today.   Labs reviewed: Na: 134, CBGS: 148-175 (inpatient orders for glycemic control are 0-5 units insulin aspart q HS, 0-9 units insulin aspart TID with meals).   Diet Order:   Diet Order            Diet Carb Modified Fluid consistency: Thin; Room service appropriate? Yes  Diet effective now              EDUCATION NEEDS:   Education needs have been addressed  Skin:  Skin Assessment: Skin  Integrity Issues: Skin Integrity Issues:: Wound VAC Wound Vac: abdomen  Last BM:  11/30/18 (150 ml output via ileostomy)  Height:   Ht Readings from Last 1 Encounters:  11/22/18 $RemoveB'5\' 8"'uuHyWgLv$  (1.727 m)    Weight:   Wt Readings from Last 1 Encounters:  11/22/18 112.5 kg    Ideal Body Weight:  70 kg  BMI:  Body mass index is 37.71 kg/m.  Estimated Nutritional Needs:   Kcal:  2100-2300  Protein:  120-140 grams  Fluid:  > 2.1 L    [name] A. Jimmye Norman, RD, LDN, CDE Pager: [phone] After hours Pager: [phone]

## 2018-12-01 DIAGNOSIS — G8929 Other chronic pain: Secondary | ICD-10-CM | POA: Diagnosis not present

## 2018-12-01 DIAGNOSIS — Z48815 Encounter for surgical aftercare following surgery on the digestive system: Secondary | ICD-10-CM | POA: Diagnosis not present

## 2018-12-01 DIAGNOSIS — K219 Gastro-esophageal reflux disease without esophagitis: Secondary | ICD-10-CM | POA: Diagnosis not present

## 2018-12-01 DIAGNOSIS — E785 Hyperlipidemia, unspecified: Secondary | ICD-10-CM | POA: Diagnosis not present

## 2018-12-01 DIAGNOSIS — Z791 Long term (current) use of non-steroidal anti-inflammatories (NSAID): Secondary | ICD-10-CM | POA: Diagnosis not present

## 2018-12-01 DIAGNOSIS — Z432 Encounter for attention to ileostomy: Secondary | ICD-10-CM | POA: Diagnosis not present

## 2018-12-01 DIAGNOSIS — M549 Dorsalgia, unspecified: Secondary | ICD-10-CM | POA: Diagnosis not present

## 2018-12-01 DIAGNOSIS — R7303 Prediabetes: Secondary | ICD-10-CM | POA: Diagnosis not present

## 2018-12-01 NOTE — Care Management Important Message (Signed)
Important Message  Patient Details  Name: [name] MRN: [id number] Date of Birth: 10/15/1981   Medicare Important Message Given:  Yes    Danetra Glock [address]ine Circle 12/01/2018, 8:13 AM

## 2018-12-02 DIAGNOSIS — Z48815 Encounter for surgical aftercare following surgery on the digestive system: Secondary | ICD-10-CM | POA: Diagnosis not present

## 2018-12-02 LAB — CULTURE, BLOOD (ROUTINE X 2)
Culture: NO GROWTH
Culture: NO GROWTH
Special Requests: ADEQUATE
Special Requests: ADEQUATE

## 2018-12-03 ENCOUNTER — Other Ambulatory Visit: Payer: Self-pay | Admitting: Family Medicine

## 2018-12-03 ENCOUNTER — Telehealth: Payer: Self-pay | Admitting: Family Medicine

## 2018-12-03 MED ORDER — OXYCODONE HCL 5 MG PO CAPS: 5.0000 mg | ORAL_CAPSULE | Freq: Four times a day (QID) | ORAL | 0 refills | Status: DC | PRN

## 2018-12-03 NOTE — Telephone Encounter (Signed)
Informed patient of RX which he states that he already has.  [name], [place]

## 2018-12-03 NOTE — Telephone Encounter (Signed)
Pt's mom called requesting to have Dr. Reesa Chew to call the PT to something about the pain he having. Pt just got out of the hospital on Tuesday from a colon surgery. Call the pt at [phone]. ad

## 2018-12-03 NOTE — Telephone Encounter (Signed)
I have sent in a short course of oxycodone for his post-op pain however he will need to come in for his hospital f/u visit for further eval. Please inform patient this has been sent to his pharmacy

## 2018-12-04 DIAGNOSIS — M549 Dorsalgia, unspecified: Secondary | ICD-10-CM | POA: Diagnosis not present

## 2018-12-04 DIAGNOSIS — Z432 Encounter for attention to ileostomy: Secondary | ICD-10-CM | POA: Diagnosis not present

## 2018-12-04 DIAGNOSIS — Z48815 Encounter for surgical aftercare following surgery on the digestive system: Secondary | ICD-10-CM | POA: Diagnosis not present

## 2018-12-04 DIAGNOSIS — E785 Hyperlipidemia, unspecified: Secondary | ICD-10-CM | POA: Diagnosis not present

## 2018-12-04 DIAGNOSIS — K219 Gastro-esophageal reflux disease without esophagitis: Secondary | ICD-10-CM | POA: Diagnosis not present

## 2018-12-04 DIAGNOSIS — G8929 Other chronic pain: Secondary | ICD-10-CM | POA: Diagnosis not present

## 2018-12-04 DIAGNOSIS — R7303 Prediabetes: Secondary | ICD-10-CM | POA: Diagnosis not present

## 2018-12-04 DIAGNOSIS — Z791 Long term (current) use of non-steroidal anti-inflammatories (NSAID): Secondary | ICD-10-CM | POA: Diagnosis not present

## 2018-12-07 ENCOUNTER — Ambulatory Visit: Payer: Self-pay | Admitting: Family Medicine

## 2018-12-07 ENCOUNTER — Other Ambulatory Visit: Payer: Self-pay

## 2018-12-07 ENCOUNTER — Ambulatory Visit (INDEPENDENT_AMBULATORY_CARE_PROVIDER_SITE_OTHER): Payer: Medicare Other | Admitting: Family Medicine

## 2018-12-07 ENCOUNTER — Encounter: Payer: Self-pay | Admitting: Family Medicine

## 2018-12-07 VITALS — BP 110/62 | HR 127 | Temp 98.5°F | Ht 68.0 in | Wt 240.0 lb

## 2018-12-07 DIAGNOSIS — R7303 Prediabetes: Secondary | ICD-10-CM | POA: Diagnosis not present

## 2018-12-07 DIAGNOSIS — Z48815 Encounter for surgical aftercare following surgery on the digestive system: Secondary | ICD-10-CM | POA: Diagnosis not present

## 2018-12-07 DIAGNOSIS — K219 Gastro-esophageal reflux disease without esophagitis: Secondary | ICD-10-CM | POA: Diagnosis not present

## 2018-12-07 DIAGNOSIS — Z933 Colostomy status: Secondary | ICD-10-CM

## 2018-12-07 DIAGNOSIS — G8929 Other chronic pain: Secondary | ICD-10-CM | POA: Diagnosis not present

## 2018-12-07 DIAGNOSIS — M549 Dorsalgia, unspecified: Secondary | ICD-10-CM | POA: Diagnosis not present

## 2018-12-07 DIAGNOSIS — F17200 Nicotine dependence, unspecified, uncomplicated: Secondary | ICD-10-CM | POA: Diagnosis not present

## 2018-12-07 DIAGNOSIS — R109 Unspecified abdominal pain: Secondary | ICD-10-CM

## 2018-12-07 DIAGNOSIS — K56609 Unspecified intestinal obstruction, unspecified as to partial versus complete obstruction: Secondary | ICD-10-CM

## 2018-12-07 DIAGNOSIS — Z791 Long term (current) use of non-steroidal anti-inflammatories (NSAID): Secondary | ICD-10-CM | POA: Diagnosis not present

## 2018-12-07 DIAGNOSIS — E785 Hyperlipidemia, unspecified: Secondary | ICD-10-CM | POA: Diagnosis not present

## 2018-12-07 DIAGNOSIS — Z432 Encounter for attention to ileostomy: Secondary | ICD-10-CM | POA: Diagnosis not present

## 2018-12-07 MED ORDER — ALBUTEROL SULFATE HFA 108 (90 BASE) MCG/ACT IN AERS: 2.0000 | INHALATION_SPRAY | Freq: Four times a day (QID) | RESPIRATORY_TRACT | 2 refills | Status: DC | PRN

## 2018-12-07 MED ORDER — IBUPROFEN 800 MG PO TABS: 800.0000 mg | ORAL_TABLET | Freq: Three times a day (TID) | ORAL | 0 refills | Status: DC | PRN

## 2018-12-07 NOTE — Patient Instructions (Signed)
It was nice seeing you today.  You were seen in clinic for hospital follow-up and your colostomy seems to be healing well.  We discussed not needing oxygen anymore as your ambulatory pulse ox was within normal range.  We also discussed possible referral to urology for blood in your urine however you expressed you did not want referral at this time as your urine has cleared up.  If you do notice any blood in your urine or concerning findings please call my clinic to let me know and I will be happy to place that referral for you.  If you have any fevers, chills or worsening abdominal pain please make an appointment to be seen by a provider.  Lovenia Kim MD

## 2018-12-07 NOTE — Progress Notes (Signed)
Subjective:   Patient ID: [name]    DOB: 06/21/1981, 38 y.o. male   MRN: [id number]  CC: hospital f/u   HPI: [name] is a 38 y.o. male who presents to clinic today for the following issue.  Hospital f/u  Admitted on 11/22/18 for large bowel obstruction 2/2 diverticulitis and was found to have a perforation within hours of admission.  Was taken for an emergent exploratory laparotomy - underwent resection of sigmoid and right colon with placement of terminal ileostomy and decompressive transverse colostomy. Post op a midline wound vac was placed. Path results were significant for cecal perforation, adhesions, diverticulosis and diverticulitis with fibrosis and strictures. He was treated with IV antibiotics and transitioned to po antibiotics once able.  Pain controlled with oxycodone upon leaving hospital.  Discharged on prn oxygen as he was noted to desat to 88% with ambulation. He states he has not been using home O2 as he feels he has not needed it, Oxygen was picked up yesterday by [facility].  Discharged on 11/30/18 in stable condition and has followed up with general surgery as outpatient for postop f/u and ostomy clinic. Follows with Dr. Sheliah Hatch. Has an appointment on 2/26 to discuss when to plan for reversing it based on how it is healing.  He reports some pain at the site today but overall is improving as the site heals. He requests for me to call supply company for colostomy bag and other supplies for home care. Number provided is [phone].    During hospitalization had discussed referral to urology as outpatient for hematuria.  Patient states this is cleared up and he prefers not to be referred at this time.  Of note, reassessed for O2 requirement today, he ambulated with pulse ox sats 96-97%.  Will d/c home O2.    ROS: No fever, chills, nausea, vomiting.  No abdominal pain, SOB or CP.   Social: pt is a current daily smoker.  Medications reviewed. Objective:   BP  110/62   Pulse (!) 127   Temp 98.5 F (36.9 C) (Oral)   Ht 5\' 8"  (1.727 m)   Wt 240 lb (108.9 kg)   SpO2 97%   BMI 36.49 kg/m  Vitals and nursing note reviewed.  General: 38 year old male, NAD Neck: supple CV: RRR no MRG  Lungs: CTAB, normal effort  Abdomen: soft, colostomy bag in place with overlying dressing, no erythema or evidence of pus, +bs  Skin: warm, dry, no rash Extremities: warm and well perfused  Assessment & Plan:   Colon obstruction (HCC) S/p resection of sigmoid and right colon with placement of terminal ileostomy and decompressive transverse colostomy. Well healing on exam, and pain seems to be improving. Has been following up with surgery as outpatient.   -Have called over to medical company for him to obtain his colostomy bag and other supplies -Rx: Ibuprofen 800 mg  -Advised use of ice pack and discussed home wound care  Upcoming appt with Dr. 30 to discuss timeline of reversal Follow up as needed    TOBACCO ABUSE Counseled on tobacco cessation, he is not ready to quit.  No orders of the defined types were placed in this encounter.  Meds ordered this encounter  Medications  . albuterol (PROVENTIL HFA;VENTOLIN HFA) 108 (90 Base) MCG/ACT inhaler    Sig: Inhale 2 puffs into the lungs every 6 (six) hours as needed for wheezing or shortness of breath.    Dispense:  1 Inhaler  Refill:  2  . ibuprofen (ADVIL,MOTRIN) 800 MG tablet    Sig: Take 1 tablet (800 mg total) by mouth every 8 (eight) hours as needed.    Dispense:  60 tablet    Refill:  0   Lovenia Kim, MD [place] PGY-3

## 2018-12-07 NOTE — Patient Outreach (Signed)
[facility]) Care Management  12/07/2018  [place] [birth year] [id number]   EMMI- General Discahrge RED ON EMMI ALERT Day # 4 Date: 12/06/2018 Red Alert Reason:  Read discharge papers? No  Sad/hopeless/anxious/empty? Yes    Outreach attempt: spoke with patient. He is able to verify HIPAA.  He states he is doing well.  Addressed red alerts with patient.  He has had time to read discharge papers and has no questions or concerns.  Patient states that he does have some down feelings with all that has transpired with him but states he takes medications and denies suicidal thoughts.  Patient acknowledges he has a physician appointment today and has transportation to appointment. Patient states that Seth Ward has been by today and he denies any problems with surgical areas.  No interventions needed per patient and none assessed on call.     Plan: RN CM will close case.  Jone Baseman, RN, MSN [facility] Care Management Care Management Coordinator Direct Line [phone] Toll Free: [phone]  Fax: [phone]

## 2018-12-07 NOTE — Assessment & Plan Note (Signed)
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 Well healing on exam, and pain seems to be improving. Has been following up with surgery as outpatient.   -Have called over to medical company for him to obtain his colostomy bag and other supplies -Rx: Ibuprofen 800 mg  -Advised use of ice pack and discussed home wound care  Upcoming appt with Dr. Kieth Brightly to discuss timeline of reversal Follow up as needed

## 2018-12-07 NOTE — Assessment & Plan Note (Signed)
Counseled on tobacco cessation, he is not ready to quit.

## 2018-12-08 ENCOUNTER — Telehealth: Payer: Self-pay | Admitting: Family Medicine

## 2018-12-08 DIAGNOSIS — Z48815 Encounter for surgical aftercare following surgery on the digestive system: Secondary | ICD-10-CM | POA: Diagnosis not present

## 2018-12-08 NOTE — Telephone Encounter (Signed)
Pt's mother called and said that he forgot to get a note written for his employer stating when he may return to work. Pt mother would like for Dr. Reesa Chew to write a note to be picked up at the front desk.   She said that pt is still in a lot of pain and knows that he was told he may return to work on Monday 02/24 but he would like to know if he could be written out longer.   Please call pt when letter is ready to be picked up.

## 2018-12-11 DIAGNOSIS — R7303 Prediabetes: Secondary | ICD-10-CM | POA: Diagnosis not present

## 2018-12-11 DIAGNOSIS — G8929 Other chronic pain: Secondary | ICD-10-CM | POA: Diagnosis not present

## 2018-12-11 DIAGNOSIS — M549 Dorsalgia, unspecified: Secondary | ICD-10-CM | POA: Diagnosis not present

## 2018-12-11 DIAGNOSIS — K219 Gastro-esophageal reflux disease without esophagitis: Secondary | ICD-10-CM | POA: Diagnosis not present

## 2018-12-11 DIAGNOSIS — E785 Hyperlipidemia, unspecified: Secondary | ICD-10-CM | POA: Diagnosis not present

## 2018-12-11 DIAGNOSIS — Z48815 Encounter for surgical aftercare following surgery on the digestive system: Secondary | ICD-10-CM | POA: Diagnosis not present

## 2018-12-11 DIAGNOSIS — Z791 Long term (current) use of non-steroidal anti-inflammatories (NSAID): Secondary | ICD-10-CM | POA: Diagnosis not present

## 2018-12-11 DIAGNOSIS — Z432 Encounter for attention to ileostomy: Secondary | ICD-10-CM | POA: Diagnosis not present

## 2018-12-13 ENCOUNTER — Encounter: Payer: Self-pay | Admitting: Family Medicine

## 2018-12-13 ENCOUNTER — Other Ambulatory Visit: Payer: Self-pay | Admitting: Family Medicine

## 2018-12-13 DIAGNOSIS — Z933 Colostomy status: Secondary | ICD-10-CM | POA: Diagnosis not present

## 2018-12-13 DIAGNOSIS — Z48815 Encounter for surgical aftercare following surgery on the digestive system: Secondary | ICD-10-CM | POA: Diagnosis not present

## 2018-12-13 DIAGNOSIS — K631 Perforation of intestine (nontraumatic): Secondary | ICD-10-CM | POA: Diagnosis not present

## 2018-12-13 DIAGNOSIS — Z7189 Other specified counseling: Secondary | ICD-10-CM | POA: Diagnosis not present

## 2018-12-13 NOTE — Telephone Encounter (Signed)
Pt informed. Pt says he doesn't know if he could go back that soon because his bag is leaking and its embarrassing. Also states that he will be going back to see his surgeon Wednesday about it leaking. I did let him know maybe the surgeon will have to write a note fot him. He states that he will pick up the note. Deseree Kennon Holter, CMA

## 2018-12-13 NOTE — Telephone Encounter (Signed)
I have written him a note keeping him out of work until Monday, December 20, 2018.  Please inform him he may pick this up from the front desk at his earliest convenience.

## 2018-12-14 DIAGNOSIS — R7303 Prediabetes: Secondary | ICD-10-CM | POA: Diagnosis not present

## 2018-12-14 DIAGNOSIS — K219 Gastro-esophageal reflux disease without esophagitis: Secondary | ICD-10-CM | POA: Diagnosis not present

## 2018-12-14 DIAGNOSIS — E785 Hyperlipidemia, unspecified: Secondary | ICD-10-CM | POA: Diagnosis not present

## 2018-12-14 DIAGNOSIS — Z48815 Encounter for surgical aftercare following surgery on the digestive system: Secondary | ICD-10-CM | POA: Diagnosis not present

## 2018-12-14 DIAGNOSIS — Z432 Encounter for attention to ileostomy: Secondary | ICD-10-CM | POA: Diagnosis not present

## 2018-12-14 DIAGNOSIS — M549 Dorsalgia, unspecified: Secondary | ICD-10-CM | POA: Diagnosis not present

## 2018-12-14 DIAGNOSIS — G8929 Other chronic pain: Secondary | ICD-10-CM | POA: Diagnosis not present

## 2018-12-14 DIAGNOSIS — Z791 Long term (current) use of non-steroidal anti-inflammatories (NSAID): Secondary | ICD-10-CM | POA: Diagnosis not present

## 2018-12-17 DIAGNOSIS — K219 Gastro-esophageal reflux disease without esophagitis: Secondary | ICD-10-CM | POA: Diagnosis not present

## 2018-12-17 DIAGNOSIS — M549 Dorsalgia, unspecified: Secondary | ICD-10-CM | POA: Diagnosis not present

## 2018-12-17 DIAGNOSIS — Z791 Long term (current) use of non-steroidal anti-inflammatories (NSAID): Secondary | ICD-10-CM | POA: Diagnosis not present

## 2018-12-17 DIAGNOSIS — E785 Hyperlipidemia, unspecified: Secondary | ICD-10-CM | POA: Diagnosis not present

## 2018-12-17 DIAGNOSIS — Z48815 Encounter for surgical aftercare following surgery on the digestive system: Secondary | ICD-10-CM | POA: Diagnosis not present

## 2018-12-17 DIAGNOSIS — Z432 Encounter for attention to ileostomy: Secondary | ICD-10-CM | POA: Diagnosis not present

## 2018-12-17 DIAGNOSIS — G8929 Other chronic pain: Secondary | ICD-10-CM | POA: Diagnosis not present

## 2018-12-17 DIAGNOSIS — R7303 Prediabetes: Secondary | ICD-10-CM | POA: Diagnosis not present

## 2018-12-22 DIAGNOSIS — H524 Presbyopia: Secondary | ICD-10-CM | POA: Diagnosis not present

## 2018-12-22 DIAGNOSIS — H5213 Myopia, bilateral: Secondary | ICD-10-CM | POA: Diagnosis not present

## 2018-12-22 DIAGNOSIS — E119 Type 2 diabetes mellitus without complications: Secondary | ICD-10-CM | POA: Diagnosis not present

## 2018-12-22 DIAGNOSIS — H52203 Unspecified astigmatism, bilateral: Secondary | ICD-10-CM | POA: Diagnosis not present

## 2018-12-23 DIAGNOSIS — K219 Gastro-esophageal reflux disease without esophagitis: Secondary | ICD-10-CM | POA: Diagnosis not present

## 2018-12-23 DIAGNOSIS — Z48815 Encounter for surgical aftercare following surgery on the digestive system: Secondary | ICD-10-CM | POA: Diagnosis not present

## 2018-12-23 DIAGNOSIS — R7303 Prediabetes: Secondary | ICD-10-CM | POA: Diagnosis not present

## 2018-12-23 DIAGNOSIS — E785 Hyperlipidemia, unspecified: Secondary | ICD-10-CM | POA: Diagnosis not present

## 2018-12-23 DIAGNOSIS — Z432 Encounter for attention to ileostomy: Secondary | ICD-10-CM | POA: Diagnosis not present

## 2018-12-23 DIAGNOSIS — G8929 Other chronic pain: Secondary | ICD-10-CM | POA: Diagnosis not present

## 2018-12-23 DIAGNOSIS — M549 Dorsalgia, unspecified: Secondary | ICD-10-CM | POA: Diagnosis not present

## 2018-12-23 DIAGNOSIS — Z791 Long term (current) use of non-steroidal anti-inflammatories (NSAID): Secondary | ICD-10-CM | POA: Diagnosis not present

## 2018-12-29 DIAGNOSIS — Z791 Long term (current) use of non-steroidal anti-inflammatories (NSAID): Secondary | ICD-10-CM | POA: Diagnosis not present

## 2018-12-29 DIAGNOSIS — Z48815 Encounter for surgical aftercare following surgery on the digestive system: Secondary | ICD-10-CM | POA: Diagnosis not present

## 2018-12-29 DIAGNOSIS — R7303 Prediabetes: Secondary | ICD-10-CM | POA: Diagnosis not present

## 2018-12-29 DIAGNOSIS — K219 Gastro-esophageal reflux disease without esophagitis: Secondary | ICD-10-CM | POA: Diagnosis not present

## 2018-12-29 DIAGNOSIS — G8929 Other chronic pain: Secondary | ICD-10-CM | POA: Diagnosis not present

## 2018-12-29 DIAGNOSIS — M549 Dorsalgia, unspecified: Secondary | ICD-10-CM | POA: Diagnosis not present

## 2018-12-29 DIAGNOSIS — Z432 Encounter for attention to ileostomy: Secondary | ICD-10-CM | POA: Diagnosis not present

## 2018-12-29 DIAGNOSIS — E785 Hyperlipidemia, unspecified: Secondary | ICD-10-CM | POA: Diagnosis not present

## 2019-01-04 ENCOUNTER — Encounter (HOSPITAL_COMMUNITY): Payer: Self-pay

## 2019-01-04 ENCOUNTER — Other Ambulatory Visit: Payer: Self-pay | Admitting: Family Medicine

## 2019-01-04 ENCOUNTER — Emergency Department (HOSPITAL_COMMUNITY)
Admission: EM | Admit: 2019-01-04 | Discharge: 2019-01-04 | Disposition: A | Discharge status: Home / Self Care | Payer: Medicare Other | Attending: Emergency Medicine | Admitting: Emergency Medicine

## 2019-01-04 ENCOUNTER — Other Ambulatory Visit: Payer: Self-pay

## 2019-01-04 DIAGNOSIS — K922 Gastrointestinal hemorrhage, unspecified: Secondary | ICD-10-CM | POA: Insufficient documentation

## 2019-01-04 DIAGNOSIS — Z79899 Other long term (current) drug therapy: Secondary | ICD-10-CM | POA: Insufficient documentation

## 2019-01-04 DIAGNOSIS — F17210 Nicotine dependence, cigarettes, uncomplicated: Secondary | ICD-10-CM | POA: Diagnosis not present

## 2019-01-04 DIAGNOSIS — E119 Type 2 diabetes mellitus without complications: Secondary | ICD-10-CM | POA: Diagnosis not present

## 2019-01-04 DIAGNOSIS — K219 Gastro-esophageal reflux disease without esophagitis: Secondary | ICD-10-CM

## 2019-01-04 DIAGNOSIS — K9400 Colostomy complication, unspecified: Secondary | ICD-10-CM | POA: Diagnosis present

## 2019-01-04 LAB — CBC WITH DIFFERENTIAL/PLATELET
Abs Immature Granulocytes: 0.03 10*3/uL (ref 0.00–0.07)
Basophils Absolute: 0.1 10*3/uL (ref 0.0–0.1)
Basophils Relative: 1 %
Eosinophils Absolute: 0.5 10*3/uL (ref 0.0–0.5)
Eosinophils Relative: 6 %
HCT: 37.9 % — ABNORMAL LOW (ref 39.0–52.0)
Hemoglobin: 11.7 g/dL — ABNORMAL LOW (ref 13.0–17.0)
Immature Granulocytes: 0 %
Lymphocytes Relative: 24 %
Lymphs Abs: 2.3 10*3/uL (ref 0.7–4.0)
MCH: 26.6 pg (ref 26.0–34.0)
MCHC: 30.9 g/dL (ref 30.0–36.0)
MCV: 86.1 fL (ref 80.0–100.0)
Monocytes Absolute: 0.6 10*3/uL (ref 0.1–1.0)
Monocytes Relative: 7 %
Neutro Abs: 6 10*3/uL (ref 1.7–7.7)
Neutrophils Relative %: 62 %
Platelets: 253 10*3/uL (ref 150–400)
RBC: 4.4 MIL/uL (ref 4.22–5.81)
RDW: 13.5 % (ref 11.5–15.5)
WBC: 9.5 10*3/uL (ref 4.0–10.5)
nRBC: 0 % (ref 0.0–0.2)

## 2019-01-04 LAB — COMPREHENSIVE METABOLIC PANEL
ALK PHOS: 98 U/L (ref 38–126)
ALT: 27 U/L (ref 0–44)
AST: 18 U/L (ref 15–41)
Albumin: 3.8 g/dL (ref 3.5–5.0)
Anion gap: 7 (ref 5–15)
BUN: 6 mg/dL (ref 6–20)
CO2: 28 mmol/L (ref 22–32)
Calcium: 8.8 mg/dL — ABNORMAL LOW (ref 8.9–10.3)
Chloride: 102 mmol/L (ref 98–111)
Creatinine, Ser: 0.69 mg/dL (ref 0.61–1.24)
GFR calc Af Amer: >60 mL/min (ref >60)
GFR calc non Af Amer: >60 mL/min (ref >60)
Glucose, Bld: 107 mg/dL — ABNORMAL HIGH (ref 70–99)
Potassium: 3.2 mmol/L — ABNORMAL LOW (ref 3.5–5.1)
Sodium: 137 mmol/L (ref 135–145)
Total Bilirubin: 0.3 mg/dL (ref 0.3–1.2)
Total Protein: 8.4 g/dL — ABNORMAL HIGH (ref 6.5–8.1)

## 2019-01-04 LAB — TYPE AND SCREEN
ABO/RH(D): O NEG
Antibody Screen: NEGATIVE

## 2019-01-04 LAB — APTT: aPTT: 29 seconds (ref 24–36)

## 2019-01-04 LAB — ABO/RH: ABO/RH(D): O NEG

## 2019-01-04 LAB — PROTIME-INR
INR: 1 (ref 0.8–1.2)
Prothrombin Time: 13.4 seconds (ref 11.4–15.2)

## 2019-01-04 MED ORDER — SODIUM CHLORIDE 0.9 % IV SOLN
INTRAVENOUS | Status: DC
  Administered 2019-01-04: 17:00:00 via INTRAVENOUS

## 2019-01-04 MED ORDER — SODIUM CHLORIDE 0.9 % IV BOLUS
500.0000 mL | Freq: Once | INTRAVENOUS | Status: AC
  Administered 2019-01-04: 500 mL via INTRAVENOUS

## 2019-01-04 NOTE — ED Provider Notes (Signed)
[facility] DEPT Provider Note   CSN: [id number] Arrival date & time: 01/04/19  1539    History   Chief Complaint Chief Complaint  Patient presents with  . Colostomy Bag Leaking    HPI [name] is a 38 y.o. male.   HPI Patient presented to the emergency room for evaluation of GI bleeding.  Patient has a history of a colostomy.  Patient states he had benign colon tumor that was removed although records indicate he had diverticulitis and obstruction.  Patient noticed some liquid stools and leaking of his colostomy bag today.  He has noticed that the stools are bloody.  Patient denies any nausea vomiting.  No abdominal pain.  No fevers or chills.  He has not seen GI yet and states he is supposed to see his surgeon in the next month about reversing his colostomy Past Medical History:  Diagnosis Date  . Allergy   . Anxiety   . 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 Bipolar disorder ([place])    On disability  . Depression   . Diabetes mellitus without complication ([place])   . Headache(784.0)   . Hyperlipidemia   . Neuromuscular disorder ([place])    Beckers muscular dystrophy  . Obesity   . Sleep apnea    Does not tolerate CPAP    Patient Active Problem List   Diagnosis Date Noted  . Hypoxemia 11/30/2018  . Colon obstruction ([place])   . Leukocytosis   . Hypokalemia   . Other chest pain 11/22/2018  . Abdominal pain 11/22/2018  . Memory difficulty 10/04/2018  . Dysuria 09/10/2018  . Erectile dysfunction 01/29/2018  . Rash and nonspecific skin eruption 05/10/2017  . Neck pain 11/12/2016  . Back pain 10/25/2016  . Sleep difficulties 10/25/2016  . Allergic rhinitis 04/28/2016  . Dizzy 12/07/2015  . Type 2 diabetes mellitus without complication ([place]) 59/93/5701  . GERD (gastroesophageal reflux disease) 03/21/2013  . Headache 01/31/2013  . Gait disorder 01/31/2013  . Elevated BP 04/25/2012  . Narcotic abuse ([place]) 06/05/2011  . OBESITY 10/17/2010   . 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 SLEEP APNEA 01/24/2009  . BIPOLAR DISORDER UNSPECIFIED 05/05/2008  . HYPERLIPIDEMIA 03/30/2008  . ABUSE, ALCOHOL, UNSPECIFIED 04/26/2007  . TOBACCO ABUSE 12/25/2006    Past Surgical History:  Procedure Laterality Date  . APPLICATION OF WOUND VAC N/A 11/23/2018   Procedure: Application Of Wound Vac;  Surgeon: Kinsinger, Arta Bruce, MD;  Location: [place];  Service: General;  Laterality: N/A;  . COLECTOMY N/A 11/23/2018   Procedure: TOTAL COLECTOMY;  Surgeon: Kieth Brightly Arta Bruce, MD;  Location: [place];  Service: General;  Laterality: N/A;  . ILEOSTOMY N/A 11/23/2018   Procedure: ILEOSTOMY;  Surgeon: Kinsinger, Arta Bruce, MD;  Location: [place];  Service: General;  Laterality: N/A;  . NM MYOCAR PERF WALL MOTION  01/22/2011   protocol: Persantine, moderate reversible inferior defect post stress EF 48%, high risk scan  . TRANSTHORACIC ECHOCARDIOGRAM  07/05/2004   EF=>55% normal study         Home Medications    Prior to Admission medications   Medication Sig Start Date End Date Taking? Authorizing Provider  albuterol (PROVENTIL HFA;VENTOLIN HFA) 108 (90 Base) MCG/ACT inhaler Inhale 2 puffs into the lungs every 6 (six) hours as needed for wheezing or shortness of breath. 12/07/18  Yes Lovenia Kim, MD  atorvastatin (LIPITOR) 40 MG tablet Take 1 tablet (40 mg total) by mouth daily. Patient taking differently: Take 40 mg by mouth  at bedtime.  09/10/18  Yes Lovenia Kim, MD  diphenhydramine-acetaminophen (TYLENOL PM) 25-500 MG TABS tablet Take 3 tablets by mouth at bedtime.   Yes [provider]  gabapentin (NEURONTIN) 300 MG capsule Take 300 mg by mouth daily.  06/20/16  Yes [provider]  ibuprofen (ADVIL,MOTRIN) 800 MG tablet Take 1 tablet (800 mg total) by mouth every 8 (eight) hours as needed. 12/07/18  Yes Lovenia Kim, MD  lamoTRIgine (LAMICTAL) 25 MG tablet Take 1 tab daily for 1 week and than 2 tab daily Patient taking differently:  Take 25 mg by mouth at bedtime.  12/26/15  Yes Arfeen, Arlyce Harman, MD  LATUDA 80 MG TABS tablet Take 80 mg by mouth at bedtime.  11/16/18  Yes [provider]  omeprazole (PRILOSEC) 40 MG capsule Take 1 capsule (40 mg total) by mouth at bedtime. 01/04/19  Yes Lovenia Kim, MD  oxycodone (OXY-IR) 5 MG capsule Take 1 capsule (5 mg total) by mouth every 6 (six) hours as needed. 12/03/18  Yes Lovenia Kim, MD  traZODone (DESYREL) 50 MG tablet Take 50 mg by mouth at bedtime.  08/22/18  Yes [provider]  VIIBRYD 40 MG TABS Take 40 mg by mouth daily. 11/16/18  Yes [provider]  VYVANSE 20 MG capsule Take 20 mg by mouth daily with lunch.  06/28/18  Yes [provider]  VYVANSE 50 MG capsule Take 50 mg by mouth daily with breakfast.  06/28/18  Yes [provider]  ACCU-CHEK FASTCLIX LANCETS MISC Check blood sugar once daily 03/23/15   Leeanne Rio, MD  Blood Glucose Monitoring Suppl (ACCU-CHEK NANO SMARTVIEW) W/DEVICE KIT Check blood sugar once daily 03/23/15   Leeanne Rio, MD  feeding supplement, ENSURE ENLIVE, (ENSURE ENLIVE) LIQD Take 237 mLs by mouth 2 (two) times daily between meals. Patient not taking: Reported on 01/04/2019 12/01/18   Wilber Oliphant, MD  glucose blood [facility]) test strip Check blood sugar once daily 03/23/15   Leeanne Rio, MD  meloxicam (MOBIC) 15 MG tablet Take 1 tablet (15 mg total) by mouth daily. Patient not taking: Reported on 01/04/2019 11/12/18   Marjie Skiff, MD  Multiple Vitamin (MULTIVITAMIN WITH MINERALS) TABS tablet Take 1 tablet by mouth daily. Patient not taking: Reported on 01/04/2019 12/01/18   Wilber Oliphant, MD    Family History Family History  Problem Relation Age of Onset  . COPD Mother   . Depression Mother   . Diabetes Mother   . Hyperlipidemia Mother   . Asthma Father   . Arthritis Father   . Diabetes Father   . Heart disease Father   . Hyperlipidemia Father   . Hypertension Father    . Hypertension Brother     Social History Social History   Tobacco Use  . Smoking status: Current Every Day Smoker    Packs/day: 1.00    Years: 21.00    Pack years: 21.00    Types: Cigarettes  . Smokeless tobacco: Never Used  . Tobacco comment: Reports using vapor cigarettes currently trying to quit  Substance Use Topics  . Alcohol use: No    Alcohol/week: 0.0 standard drinks    Comment: quit 2013  . Drug use: No     Allergies   Penicillins; Amoxicillin; and Metformin and related   Review of Systems Review of Systems  All other systems reviewed and are negative.    Physical Exam Updated Vital Signs BP 111/75   Pulse 95  Temp 98.7 F (37.1 C) (Oral)   Resp 16   Ht 1.702 m ('5\' 7"'$ )   Wt 108 kg   SpO2 100%   BMI 37.28 kg/m   Physical Exam Vitals signs and nursing note reviewed.  Constitutional:      General: He is not in acute distress.    Appearance: He is well-developed.  HENT:     Head: Normocephalic and atraumatic.     Right Ear: External ear normal.     Left Ear: External ear normal.  Eyes:     General: No scleral icterus.       Right eye: No discharge.        Left eye: No discharge.     Conjunctiva/sclera: Conjunctivae normal.  Neck:     Musculoskeletal: Neck supple.     Trachea: No tracheal deviation.  Cardiovascular:     Rate and Rhythm: Normal rate and regular rhythm.  Pulmonary:     Effort: Pulmonary effort is normal. No respiratory distress.     Breath sounds: Normal breath sounds. No stridor. No wheezing or rales.  Abdominal:     General: Bowel sounds are normal. There is no distension.     Palpations: Abdomen is soft.     Tenderness: There is no abdominal tenderness. There is no guarding or rebound.     Comments: Colostomy bag noted to have bloody appearing liquid stool  Musculoskeletal:        General: No swelling, tenderness or deformity.  Skin:    General: Skin is warm and dry.     Findings: No rash.  Neurological:      Mental Status: He is alert.     Cranial Nerves: Cranial nerve deficit: no gross deficits.     Sensory: No sensory deficit.     Motor: No abnormal muscle tone or seizure activity.     Coordination: Coordination normal.      ED Treatments / Results  Labs (all labs ordered are listed, but only abnormal results are displayed) Labs Reviewed  COMPREHENSIVE METABOLIC PANEL - Abnormal; Notable for the following components:      Result Value   Potassium 3.2 (*)    Glucose, Bld 107 (*)    Calcium 8.8 (*)    Total Protein 8.4 (*)    All other components within normal limits  CBC WITH DIFFERENTIAL/PLATELET - Abnormal; Notable for the following components:   Hemoglobin 11.7 (*)    HCT 37.9 (*)    All other components within normal limits  APTT  PROTIME-INR  POC OCCULT BLOOD, ED  TYPE AND SCREEN  ABO/RH     Procedures Procedures (including critical care time)  Medications Ordered in ED Medications  sodium chloride 0.9 % bolus 500 mL (500 mLs Intravenous New Bag/Given 01/04/19 1703)    And  0.9 %  sodium chloride infusion ( Intravenous New Bag/Given 01/04/19 1703)     Initial Impression / Assessment and Plan / ED Course  I have reviewed the triage vital signs and the nursing notes.  Pertinent labs & imaging results that were available during my care of the patient were reviewed by me and considered in my medical decision making (see chart for details).   Pt presents with concerns of blood in his stool.  Pt did have some blood noted in his colostomy bag.  No clots.  NO active bleeding.  Labs are reassuring.  Hemoglobin is improved.  Pt likely has bleeding related to his diverticulitis.  Discussed admitting to  the hospital for observation.  Pt states he feels well.  He does not want to be admitted. Labs are stable.  No signs of active severe bleeding.   Discussed close outpatient follow up.  Warning signs and precautions discussed.   Final Clinical Impressions(s) / ED Diagnoses    Final diagnoses:  Gastrointestinal hemorrhage, unspecified gastrointestinal hemorrhage type    ED Discharge Orders    None       Dorie Rank, MD 01/04/19 [phone]

## 2019-01-04 NOTE — ED Triage Notes (Signed)
Patient arrived via POV. Patient AOx4 and ambulatory. Patient chief complaint is Colostomy Bag is leaking. Patient has no other complaints.

## 2019-01-04 NOTE — Discharge Instructions (Signed)
Follow up with your general surgeon as we discussed, call tomorrow, return to the ED if you start having recurrent bleeding, feeling lightheaded, fevers or other concerning symptoms

## 2019-01-05 ENCOUNTER — Encounter: Payer: Self-pay | Admitting: Internal Medicine

## 2019-01-06 ENCOUNTER — Other Ambulatory Visit: Payer: Self-pay

## 2019-01-06 NOTE — Patient Outreach (Addendum)
[facility]) Care Management  01/06/2019  [place] 1981/03/24 [id number]   Referral Date: 01/06/2019 Referral Source: [facility] referral Referral Reason: Need for ostomy resources   Outreach Attempt: No answer.  HIPAA compliant voice message left.    Update: Incoming call from patient. He is able to verify HIPAA.  Discussed reason for call.  Patient states he goes through his ostomy supplies quickly due to leakage from around site.  Discussed with patient importance of making sure seal to ostomy is good and trying to limit gas producing foods that will cause the one piece ostomy bag to come unattached from his skin.  He verbalized understanding.  Discussed with patient contacting the ostomy nurse he worked with to get more education and support in ostomy placement.  He states he will consider that.  Patient states he gets supplies from [facility] and they can only send 2 boxes each time they send.  He states that he has contacted the GI specialist who could not assist.  Advised patient unfortunately there are no resources for ostomy supplies.  He verbalized understanding.  Did recommend that patient contact his PCP to see if there were any resources they could help with.  He verbalized understanding and declined any further needs.   Plan:RN CM will close case.     Jone Baseman, RN, MSN [facility] Care Management Care Management Coordinator Direct Line [phone] Toll Free: [phone]  Fax: [phone]

## 2019-01-07 ENCOUNTER — Ambulatory Visit: Payer: Self-pay

## 2019-01-13 DIAGNOSIS — K631 Perforation of intestine (nontraumatic): Secondary | ICD-10-CM | POA: Diagnosis not present

## 2019-01-13 DIAGNOSIS — Z933 Colostomy status: Secondary | ICD-10-CM | POA: Diagnosis not present

## 2019-01-18 DIAGNOSIS — Z933 Colostomy status: Secondary | ICD-10-CM | POA: Diagnosis not present

## 2019-01-18 DIAGNOSIS — K631 Perforation of intestine (nontraumatic): Secondary | ICD-10-CM | POA: Diagnosis not present

## 2019-01-31 ENCOUNTER — Encounter: Payer: Self-pay | Admitting: General Surgery

## 2019-02-01 ENCOUNTER — Ambulatory Visit (INDEPENDENT_AMBULATORY_CARE_PROVIDER_SITE_OTHER): Payer: Medicare Other | Admitting: Internal Medicine

## 2019-02-01 ENCOUNTER — Other Ambulatory Visit: Payer: Self-pay

## 2019-02-01 ENCOUNTER — Encounter: Payer: Self-pay | Admitting: Internal Medicine

## 2019-02-01 VITALS — Ht 67.0 in | Wt 238.0 lb

## 2019-02-01 DIAGNOSIS — K5720 Diverticulitis of large intestine with perforation and abscess without bleeding: Secondary | ICD-10-CM

## 2019-02-01 DIAGNOSIS — K9413 Enterostomy malfunction: Secondary | ICD-10-CM | POA: Diagnosis not present

## 2019-02-01 DIAGNOSIS — Z933 Colostomy status: Secondary | ICD-10-CM

## 2019-02-01 NOTE — Progress Notes (Signed)
TELEHEALTH ENCOUNTER IN SETTING OF COVID-19 PANDEMIC - REQUESTED BY PATIENT SERVICE PROVIDED BY TELEMEDECINE - TYPE: Zoom PATIENT LOCATION: Home PATIENT HAS CONSENTED TO TELEHEALTH VISIT PROVIDER LOCATION: OFFICE REFERRING PROVIDER:Kinsinger PARTICIPANTS OTHER THAN PATIENT:None TIME SPENT ON CALL: 30 minutes    [name] 39 y.o. [birth year] [id number]  Assessment & Plan:   Encounter Diagnoses  Name Primary?  . Diverticulitis of large intestine with perforation without bleeding Yes  . 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 Colostomy in place [facility])     I think his diarrhea seems to be post-op and by history does not sound that bad with an ileostomy in place. Colonoscopy evaluation not possible with current anatomy - precludes prep and good visualization. Could do an ileoscopy but don't think indicated based upon Hx taken today  I will contact Dr. Kieth Brightly to see if I am missing something here. I have told the patient to ask Dr. Kieth Brightly about returning to work - I have notified the patient that due to Covid-19 restrictions he would not be having elective surgery anytime soon.  He has seen the ostomy RN in [facility] and I reviewed her note - he remains frustrated with leaking from what I think is the ileostomy - he may need to see her again. Says he needs more bags.  Cc; Dr. 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  He saw Dr. Kieth Brightly in follow-up, and the impression was that he was having chronic diarrhea and was referred for GI evaluation.  The patient reports that he has some liquid stools in his ileostomy but some are somewhat formed, there is no significant dehydration.  His main  complaint is that of leaking from his ostomies.  He had seen the ostomy nurse in [facility] who from what I can see by reviewing her notes made some recommendations but apparently he is still having leaking.  Again I think this is from the ileostomy.  He says he runs out of bags and needs more but his insurance or prescription coverage will not supply more bags.  It is not clear to me if he is asked Dr. Kieth Brightly to request more ostomy bags.  Patient has lost weight after his surgery.  He says he is not eating differently.  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  He says he is lost almost 30 pounds.  He does not have any abdominal pain.  Multiple times he denies having diarrhea problems prior to surgery.  He did have problems with intermittent constipation.  GI review of systems is otherwise negative.  He was in the ED in March due to peristomal irritation and slight bleeding  And persistent leaking from ostomy. CBC Latest Ref Rng & Units 01/04/2019 11/30/2018 11/29/2018  WBC 4.0 - 10.5 K/uL 9.5 13.7(H) 13.5(H)  Hemoglobin 13.0 - 17.0 g/dL 11.7(L) 9.2(L) 9.5(L)  Hematocrit 39.0 - 52.0 % 37.9(L) 28.9(L) 29.5(L)  Platelets 150 - 400 K/uL 253 307 275    Allergies  Allergen Reactions  . Penicillins Anaphylaxis, Shortness Of Breath and Swelling    Did it involve swelling of the face/tongue/throat, SOB, or low BP? Yes Did it involve sudden or severe  rash/hives, skin peeling, or any reaction on the inside of your mouth or nose? Unk Did you need to seek medical attention at a hospital or doctor's office? No When did it last happen?Unk If all above answers are "NO", may proceed with cephalosporin use. Got ceftriaxone before   . Amoxicillin Hives    Per patient's mother  . Metformin And Related Other (See Comments)    Stomach cramps    Current Meds  Medication Sig  . ACCU-CHEK FASTCLIX LANCETS MISC Check blood sugar once daily  .  albuterol (PROVENTIL HFA;VENTOLIN HFA) 108 (90 Base) MCG/ACT inhaler Inhale 2 puffs into the lungs every 6 (six) hours as needed for wheezing or shortness of breath.  Marland Kitchen atorvastatin (LIPITOR) 40 MG tablet Take 1 tablet (40 mg total) by mouth daily. (Patient taking differently: Take 40 mg by mouth at bedtime. )  . Blood Glucose Monitoring Suppl (ACCU-CHEK NANO SMARTVIEW) W/DEVICE KIT Check blood sugar once daily  . diphenhydramine-acetaminophen (TYLENOL PM) 25-500 MG TABS tablet Take 3 tablets by mouth at bedtime.  . gabapentin (NEURONTIN) 300 MG capsule Take 300 mg by mouth daily.   Marland Kitchen glucose blood (ACCU-CHEK SMARTVIEW) test strip Check blood sugar once daily  . ibuprofen (ADVIL,MOTRIN) 800 MG tablet Take 1 tablet (800 mg total) by mouth every 8 (eight) hours as needed.  . lamoTRIgine (LAMICTAL) 25 MG tablet Take 1 tab daily for 1 week and than 2 tab daily (Patient taking differently: Take 25 mg by mouth at bedtime. )  . LATUDA 80 MG TABS tablet Take 80 mg by mouth at bedtime.   Marland Kitchen omeprazole (PRILOSEC) 40 MG capsule Take 1 capsule (40 mg total) by mouth at bedtime.  . traZODone (DESYREL) 50 MG tablet Take 50 mg by mouth at bedtime.   Marland Kitchen VIIBRYD 40 MG TABS Take 40 mg by mouth daily.  Marland Kitchen VYVANSE 20 MG capsule Take 20 mg by mouth daily with lunch.   Marland Kitchen VYVANSE 50 MG capsule Take 50 mg by mouth daily with breakfast.    Past Medical History:  Diagnosis Date  . Allergy   . Anxiety   . 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 Bipolar disorder ([place])    On disability  . Depression   . Diabetes mellitus without complication ([place])   . Headache(784.0)   . Hyperlipidemia   . Neuromuscular disorder ([place])    Beckers muscular dystrophy  . Obesity   . Sleep apnea    Does not tolerate CPAP   Past Surgical History:  Procedure Laterality Date  . APPLICATION OF WOUND VAC N/A 11/23/2018   Procedure: Application Of Wound Vac;  Surgeon: Kinsinger, Arta Bruce, MD;  Location: [place];  Service: General;  Laterality:  N/A;  . COLECTOMY N/A 11/23/2018   Procedure: TOTAL COLECTOMY;  Surgeon: Kieth Brightly Arta Bruce, MD;  Location: [place];  Service: General;  Laterality: N/A;  . ILEOSTOMY N/A 11/23/2018   Procedure: ILEOSTOMY;  Surgeon: Kinsinger, Arta Bruce, MD;  Location: [place];  Service: General;  Laterality: N/A;  . NM MYOCAR PERF WALL MOTION  01/22/2011   protocol: Persantine, moderate reversible inferior defect post stress EF 48%, high risk scan  . TRANSTHORACIC ECHOCARDIOGRAM  07/05/2004   EF=>55% normal study    Social History   Social History Narrative   Patient lives at home with parents, brother and brothers wife.    Patient is single.    Patient has 2 children.    Patient has a high school education.  Patient is unemployed.    Patient is right handed.    family history includes Arthritis in his father; Asthma in his father; COPD in his mother; Depression in his mother; Diabetes in his father and mother; Heart disease in his father; Hyperlipidemia in his father and mother; Hypertension in his brother and father.   Review of Systems As per HPI.

## 2019-02-01 NOTE — Patient Instructions (Signed)
Good to meet you by Zoom today.  Let me get with Dr. Kieth Brightly about your situation and will get back to you.  You should ask him about the ileostomy bag supply and the leakage.  Also ask him about going to work.  I appreciate the opportunity to care for you. Gatha Mayer, MD, [facility]

## 2019-02-04 DIAGNOSIS — Z933 Colostomy status: Secondary | ICD-10-CM | POA: Diagnosis not present

## 2019-02-04 DIAGNOSIS — K631 Perforation of intestine (nontraumatic): Secondary | ICD-10-CM | POA: Diagnosis not present

## 2019-02-10 DIAGNOSIS — Z933 Colostomy status: Secondary | ICD-10-CM | POA: Diagnosis not present

## 2019-02-10 DIAGNOSIS — K631 Perforation of intestine (nontraumatic): Secondary | ICD-10-CM | POA: Diagnosis not present

## 2019-02-15 DIAGNOSIS — K631 Perforation of intestine (nontraumatic): Secondary | ICD-10-CM | POA: Diagnosis not present

## 2019-02-15 DIAGNOSIS — Z933 Colostomy status: Secondary | ICD-10-CM | POA: Diagnosis not present

## 2019-02-26 DIAGNOSIS — K219 Gastro-esophageal reflux disease without esophagitis: Secondary | ICD-10-CM | POA: Diagnosis not present

## 2019-02-26 DIAGNOSIS — L03012 Cellulitis of left finger: Secondary | ICD-10-CM | POA: Diagnosis not present

## 2019-02-26 DIAGNOSIS — Z79899 Other long term (current) drug therapy: Secondary | ICD-10-CM | POA: Diagnosis not present

## 2019-03-01 DIAGNOSIS — K631 Perforation of intestine (nontraumatic): Secondary | ICD-10-CM | POA: Diagnosis not present

## 2019-03-01 DIAGNOSIS — Z933 Colostomy status: Secondary | ICD-10-CM | POA: Diagnosis not present

## 2019-03-02 ENCOUNTER — Ambulatory Visit: Payer: Self-pay | Admitting: General Surgery

## 2019-03-03 ENCOUNTER — Other Ambulatory Visit: Payer: Self-pay | Admitting: Family Medicine

## 2019-03-04 DIAGNOSIS — Z933 Colostomy status: Secondary | ICD-10-CM | POA: Diagnosis not present

## 2019-03-04 DIAGNOSIS — K631 Perforation of intestine (nontraumatic): Secondary | ICD-10-CM | POA: Diagnosis not present

## 2019-03-07 DIAGNOSIS — Z933 Colostomy status: Secondary | ICD-10-CM | POA: Diagnosis not present

## 2019-03-07 DIAGNOSIS — K631 Perforation of intestine (nontraumatic): Secondary | ICD-10-CM | POA: Diagnosis not present

## 2019-03-08 DIAGNOSIS — Z933 Colostomy status: Secondary | ICD-10-CM | POA: Diagnosis not present

## 2019-03-08 DIAGNOSIS — K631 Perforation of intestine (nontraumatic): Secondary | ICD-10-CM | POA: Diagnosis not present

## 2019-03-31 ENCOUNTER — Other Ambulatory Visit: Payer: Self-pay | Admitting: Family Medicine

## 2019-04-04 DIAGNOSIS — R079 Chest pain, unspecified: Secondary | ICD-10-CM | POA: Diagnosis not present

## 2019-04-04 DIAGNOSIS — R0602 Shortness of breath: Secondary | ICD-10-CM | POA: Diagnosis not present

## 2019-04-04 DIAGNOSIS — J189 Pneumonia, unspecified organism: Secondary | ICD-10-CM | POA: Diagnosis not present

## 2019-04-04 DIAGNOSIS — U070 Vaping-related disorder: Secondary | ICD-10-CM | POA: Diagnosis not present

## 2019-04-04 DIAGNOSIS — Z79899 Other long term (current) drug therapy: Secondary | ICD-10-CM | POA: Diagnosis not present

## 2019-04-04 DIAGNOSIS — Z720 Tobacco use: Secondary | ICD-10-CM | POA: Diagnosis not present

## 2019-04-12 DIAGNOSIS — Z933 Colostomy status: Secondary | ICD-10-CM | POA: Diagnosis not present

## 2019-04-12 DIAGNOSIS — K631 Perforation of intestine (nontraumatic): Secondary | ICD-10-CM | POA: Diagnosis not present

## 2019-04-15 DIAGNOSIS — Z933 Colostomy status: Secondary | ICD-10-CM | POA: Diagnosis not present

## 2019-04-15 DIAGNOSIS — K631 Perforation of intestine (nontraumatic): Secondary | ICD-10-CM | POA: Diagnosis not present

## 2019-05-12 DIAGNOSIS — Z933 Colostomy status: Secondary | ICD-10-CM | POA: Diagnosis not present

## 2019-05-12 DIAGNOSIS — K631 Perforation of intestine (nontraumatic): Secondary | ICD-10-CM | POA: Diagnosis not present

## 2019-05-16 ENCOUNTER — Other Ambulatory Visit (HOSPITAL_COMMUNITY)
Admission: RE | Admit: 2019-05-16 | Discharge: 2019-05-16 | Disposition: A | Discharge status: Home / Self Care | Payer: Medicare Other | Source: Ambulatory Visit | Attending: General Surgery | Admitting: General Surgery

## 2019-05-16 DIAGNOSIS — Z20828 Contact with and (suspected) exposure to other viral communicable diseases: Secondary | ICD-10-CM | POA: Diagnosis present

## 2019-05-16 NOTE — Patient Instructions (Addendum)
YOU HAD A COVID TEST ON 05-16-2019. PLEASE BEGIN THE QUARANTINE INSTRUCTIONS AS OUTLINED IN YOUR HANDOUT.                [place]    Your procedure is scheduled on: 05-19-2019   Report to [place]  Entrance    Report to [place] at 5:30AM   1 VISITOR IS ALLOWED TO WAIT IN WAITING ROOM  ONLY DAY OF YOUR SURGERY.    Call this number if you have problems the morning of surgery [phone]    Remember: PLEASE FOLLOW ALL BOWEL PREP INSTRUCTIONS PROVIDED BY YOUR SURGEON IF ANY!   DRINK 2 PRESURGERY GATORADES DRINKS THE NIGHT BEFORE SURGERY AT 10:00 PM. NO SOLIDS AFTER MIDNIGHT THE NIGHT PRIOR TO THE SURGERY. YOU MAY DRINK CLEAR LIQUIDS UNTIL __4:30AM_____ THE NEXT MORNING (SEE DIET BELOW). AT ___4:30AM_____ THE DAY OF SURGERY, FINISH THE LAST PRE-SURGERY DRINK. NOTHING BY MOUTH AFTER THE LAST GATORADE DRINK!     BRUSH YOUR TEETH MORNING OF SURGERY AND RINSE YOUR MOUTH OUT, NO CHEWING GUM CANDY OR MINTS.     CLEAR LIQUID DIET   Foods Allowed                                                                     Foods Excluded  Coffee and tea, regular and decaf                             liquids that you cannot  Plain Jell-O any favor except red or purple                                           see through such as: Fruit ices (not with fruit pulp)                                     milk, soups, orange juice  Iced Popsicles                                    All solid food Carbonated beverages, regular and diet                                    Cranberry, grape and apple juices Sports drinks like Gatorade Lightly seasoned clear broth or consume(fat free) Sugar, honey syrup  Sample Menu Breakfast                                Lunch                                     Supper Cranberry juice  Beef broth                            Chicken broth Jell-O                                     Grape juice                           Apple  juice Coffee or tea                        Jell-O                                      Popsicle                                                Coffee or tea                        Coffee or tea  _____________________________________________________________________     Take these medicines the morning of surgery with A SIP OF WATER: GABAPENTIN, PRO-AIR INHALER IF NEEDED (PLEASE BRING)    How to Manage Your Diabetes Before and After Surgery  Why is it important to control my blood sugar before and after surgery? . Improving blood sugar levels before and after surgery helps healing and can limit problems. . A way of improving blood sugar control is eating a healthy diet by: o  Eating less sugar and carbohydrates o  Increasing activity/exercise o  Talking with your doctor about reaching your blood sugar goals . High blood sugars (greater than 180 mg/dL) can raise your risk of infections and slow your recovery, so you will need to focus on controlling your diabetes during the weeks before surgery. . Make sure that the doctor who takes care of your diabetes knows about your planned surgery including the date and location.  How do I manage my blood sugar before surgery? . Check your blood sugar at least 4 times a day, starting 2 days before surgery, to make sure that the level is not too high or low. o Check your blood sugar the morning of your surgery when you wake up and every 2 hours until you get to the Short Stay unit. . If your blood sugar is less than 70 mg/dL, you will need to treat for low blood sugar: o Do not take insulin. o Treat a low blood sugar (less than 70 mg/dL) with  cup of clear juice (cranberry or apple), 4 glucose tablets, OR glucose gel. o Recheck blood sugar in 15 minutes after treatment (to make sure it is greater than 70 mg/dL). If your blood sugar is not greater than 70 mg/dL on recheck, call [phone] for further instructions. . Report your blood sugar to the  short stay nurse when you get to Short Stay.  . If you are admitted to the hospital after surgery: o Your blood sugar will be checked by the staff and you will probably be given insulin after surgery (instead of oral diabetes medicines) to make  sure you have good blood sugar levels. o The goal for blood sugar control after surgery is 80-180 mg/dL.   WHAT DO I DO ABOUT MY DIABETES MEDICATION?   DO NOT TAKE ANY DIABETIC MEDICATIONS DAY OF YOUR SURGERY   . THE NIGHT BEFORE SURGERY, take     units of       insulin.       . THE MORNING OF SURGERY, take   units of         insulin.  . The day of surgery, do not take other diabetes injectables, including Byetta (exenatide), Bydureon (exenatide ER), Victoza (liraglutide), or Trulicity (dulaglutide).  . If your CBG is greater than 220 mg/dL, you may take  of your sliding scale  . (correction) dose of insulin.    For patients with insulin pumps: Contact your diabetes doctor for specific instructions before surgery. Decrease basal rates by 20% at midnight the night before your surgery. Note that if your surgery is planned to be longer than 2 hours, your insulin pump will be removed and intravenous (IV) insulin will be started and managed by the nurses and the anesthesiologist. You will be able to restart your insulin pump once you are awake and able to manage it.  Make sure to bring insulin pump supplies to the hospital with you in case the  site needs to be changed.  Patient Signature:  Date:   Nurse Signature:  Date:   Reviewed and Endorsed by [facility] Patient Education Committee, August 2015                               You may not have any metal on your body including hair pins and              piercings  Do not wear jewelry, make-up, lotions, powders or perfumes, deodorant             Do not wear nail polish.  Do not shave  48 hours prior to surgery.              Men may shave face and neck.   Do not bring valuables to the  hospital. [place].  Contacts, dentures or bridgework may not be worn into surgery.  Leave suitcase in the car. After surgery it may be brought to your room.     Patients discharged the day of surgery will not be allowed to drive home. IF YOU ARE HAVING SURGERY AND GOING HOME THE SAME DAY, YOU MUST HAVE AN ADULT TO DRIVE YOU HOME AND BE WITH YOU FOR 24 HOURS. YOU MAY GO HOME BY TAXI OR UBER OR ORTHERWISE, BUT AN ADULT MUST ACCOMPANY YOU HOME AND STAY WITH YOU FOR 24 HOURS.  Name and phone number of your driver:  Special Instructions: N/A              Please read over the following fact sheets you were given: _____________________________________________________________________             [facility] - Preparing for Surgery Before surgery, you can play an important role.  Because skin is not sterile, your skin needs to be as free of germs as possible.  You can reduce the number of germs on your skin by washing with CHG (chlorahexidine gluconate) soap before surgery.  CHG is an antiseptic cleaner which kills germs and bonds with the skin to continue killing germs even after washing. Please DO NOT use if you have an allergy to CHG or antibacterial soaps.  If your skin becomes reddened/irritated stop using the CHG and inform your nurse when you arrive at Short Stay. Do not shave (including legs and underarms) for at least 48 hours prior to the first CHG shower.  You may shave your face/neck. Please follow these instructions carefully:  1.  Shower with CHG Soap the night before surgery and the  morning of Surgery.  2.  If you choose to wash your hair, wash your hair first as usual with your  normal  shampoo.  3.  After you shampoo, rinse your hair and body thoroughly to remove the  shampoo.                           4.  Use CHG as you would any other liquid soap.  You can apply chg directly  to the skin and wash                       Gently with a  scrungie or clean washcloth.  5.  Apply the CHG Soap to your body ONLY FROM THE NECK DOWN.   Do not use on face/ open                           Wound or open sores. Avoid contact with eyes, ears mouth and genitals (private parts).                       Wash face,  Genitals (private parts) with your normal soap.             6.  Wash thoroughly, paying special attention to the area where your surgery  will be performed.  7.  Thoroughly rinse your body with warm water from the neck down.  8.  DO NOT shower/wash with your normal soap after using and rinsing off  the CHG Soap.                9.  Pat yourself dry with a clean towel.            10.  Wear clean pajamas.            11.  Place clean sheets on your bed the night of your first shower and do not  sleep with pets. Day of Surgery : Do not apply any lotions/deodorants the morning of surgery.  Please wear clean clothes to the hospital/surgery center.  FAILURE TO FOLLOW THESE INSTRUCTIONS MAY RESULT IN THE CANCELLATION OF YOUR SURGERY PATIENT SIGNATURE_________________________________  NURSE SIGNATURE__________________________________  ________________________________________________________________________    Adam Phenix  An incentive spirometer is a tool that can help keep your lungs clear and active. This tool measures how well you are filling your lungs with each breath. Taking long deep breaths may help reverse or decrease the chance of developing breathing (pulmonary) problems (especially infection) following:  A long period of time when you are unable to move or be active. BEFORE THE PROCEDURE   If the spirometer includes an indicator to show your best effort, your nurse or respiratory therapist will set it to a desired goal.  If possible, sit up straight or lean slightly forward. Try not to slouch.  Hold the incentive spirometer in an upright position. INSTRUCTIONS FOR USE  1. Sit on the edge of your bed if  possible, or sit up as far as you can in bed or on a chair. 2. Hold the incentive spirometer in an upright position. 3. Breathe out normally. 4. Place the mouthpiece in your mouth and seal your lips tightly around it. 5. Breathe in slowly and as deeply as possible, raising the piston or the ball toward the top of the column. 6. Hold your breath for 3-5 seconds or for as long as possible. Allow the piston or ball to fall to the bottom of the column. 7. Remove the mouthpiece from your mouth and breathe out normally. 8. Rest for a few seconds and repeat Steps 1 through 7 at least 10 times every 1-2 hours when you are awake. Take your time and take a few normal breaths between deep breaths. 9. The spirometer may include an indicator to show your best effort. Use the indicator as a goal to work toward during each repetition. 10. After each set of 10 deep breaths, practice coughing to be sure your lungs are clear. If you have an incision (the cut made at the time of surgery), support your incision when coughing by placing a pillow or rolled up towels firmly against it. Once you are able to get out of bed, walk around indoors and cough well. You may stop using the incentive spirometer when instructed by your caregiver.  RISKS AND COMPLICATIONS  Take your time so you do not get dizzy or light-headed.  If you are in pain, you may need to take or ask for pain medication before doing incentive spirometry. It is harder to take a deep breath if you are having pain. AFTER USE  Rest and breathe slowly and easily.  It can be helpful to keep track of a log of your progress. Your caregiver can provide you with a simple table to help with this. If you are using the spirometer at home, follow these instructions: [place] IF:   You are having difficultly using the spirometer.  You have trouble using the spirometer as often as instructed.  Your pain medication is not giving enough relief while using  the spirometer.  You develop fever of 100.5 F (38.1 C) or higher. SEEK IMMEDIATE MEDICAL CARE IF:   You cough up bloody sputum that had not been present before.  You develop fever of 102 F (38.9 C) or greater.  You develop worsening pain at or near the incision site. MAKE SURE YOU:   Understand these instructions.  Will watch your condition.  Will get help right away if you are not doing well or get worse. Document Released: 02/16/2007 Document Revised: 12/29/2011 Document Reviewed: 04/19/2007 [facility] Patient Information 2014 [place], [place].   ________________________________________________________________________

## 2019-05-17 ENCOUNTER — Encounter (HOSPITAL_COMMUNITY)
Admission: RE | Admit: 2019-05-17 | Discharge: 2019-05-17 | Disposition: A | Discharge status: Home / Self Care | Payer: Medicare Other | Source: Ambulatory Visit | Attending: General Surgery | Admitting: General Surgery

## 2019-05-17 ENCOUNTER — Other Ambulatory Visit: Payer: Self-pay

## 2019-05-17 ENCOUNTER — Encounter (HOSPITAL_COMMUNITY): Payer: Self-pay

## 2019-05-17 DIAGNOSIS — E785 Hyperlipidemia, unspecified: Secondary | ICD-10-CM | POA: Insufficient documentation

## 2019-05-17 DIAGNOSIS — Z888 Allergy status to other drugs, medicaments and biological substances status: Secondary | ICD-10-CM | POA: Diagnosis not present

## 2019-05-17 DIAGNOSIS — E669 Obesity, unspecified: Secondary | ICD-10-CM | POA: Insufficient documentation

## 2019-05-17 DIAGNOSIS — F419 Anxiety disorder, unspecified: Secondary | ICD-10-CM | POA: Insufficient documentation

## 2019-05-17 DIAGNOSIS — Z880 Allergy status to penicillin: Secondary | ICD-10-CM | POA: Diagnosis not present

## 2019-05-17 DIAGNOSIS — G7101 Duchenne or Becker muscular dystrophy: Secondary | ICD-10-CM | POA: Insufficient documentation

## 2019-05-17 DIAGNOSIS — K660 Peritoneal adhesions (postprocedural) (postinfection): Secondary | ICD-10-CM | POA: Diagnosis not present

## 2019-05-17 DIAGNOSIS — Z8261 Family history of arthritis: Secondary | ICD-10-CM | POA: Diagnosis not present

## 2019-05-17 DIAGNOSIS — Z932 Ileostomy status: Secondary | ICD-10-CM | POA: Insufficient documentation

## 2019-05-17 DIAGNOSIS — Z432 Encounter for attention to ileostomy: Secondary | ICD-10-CM | POA: Diagnosis not present

## 2019-05-17 DIAGNOSIS — G4730 Sleep apnea, unspecified: Secondary | ICD-10-CM | POA: Insufficient documentation

## 2019-05-17 DIAGNOSIS — Z8249 Family history of ischemic heart disease and other diseases of the circulatory system: Secondary | ICD-10-CM | POA: Diagnosis not present

## 2019-05-17 DIAGNOSIS — K219 Gastro-esophageal reflux disease without esophagitis: Secondary | ICD-10-CM | POA: Diagnosis not present

## 2019-05-17 DIAGNOSIS — Z825 Family history of asthma and other chronic lower respiratory diseases: Secondary | ICD-10-CM | POA: Diagnosis not present

## 2019-05-17 DIAGNOSIS — E119 Type 2 diabetes mellitus without complications: Secondary | ICD-10-CM | POA: Insufficient documentation

## 2019-05-17 DIAGNOSIS — Z01818 Encounter for other preprocedural examination: Secondary | ICD-10-CM | POA: Insufficient documentation

## 2019-05-17 DIAGNOSIS — Z833 Family history of diabetes mellitus: Secondary | ICD-10-CM | POA: Diagnosis not present

## 2019-05-17 DIAGNOSIS — Z79899 Other long term (current) drug therapy: Secondary | ICD-10-CM | POA: Insufficient documentation

## 2019-05-17 DIAGNOSIS — Z6837 Body mass index (BMI) 37.0-37.9, adult: Secondary | ICD-10-CM | POA: Insufficient documentation

## 2019-05-17 DIAGNOSIS — Z8349 Family history of other endocrine, nutritional and metabolic diseases: Secondary | ICD-10-CM | POA: Diagnosis not present

## 2019-05-17 DIAGNOSIS — R000 Tachycardia, unspecified: Secondary | ICD-10-CM | POA: Diagnosis not present

## 2019-05-17 DIAGNOSIS — Z881 Allergy status to other antibiotic agents status: Secondary | ICD-10-CM | POA: Diagnosis not present

## 2019-05-17 DIAGNOSIS — F17210 Nicotine dependence, cigarettes, uncomplicated: Secondary | ICD-10-CM | POA: Insufficient documentation

## 2019-05-17 DIAGNOSIS — F319 Bipolar disorder, unspecified: Secondary | ICD-10-CM | POA: Insufficient documentation

## 2019-05-17 LAB — CBC WITH DIFFERENTIAL/PLATELET
Abs Immature Granulocytes: 0.02 10*3/uL (ref 0.00–0.07)
Basophils Absolute: 0.1 10*3/uL (ref 0.0–0.1)
Basophils Relative: 1 %
Eosinophils Absolute: 0.4 10*3/uL (ref 0.0–0.5)
Eosinophils Relative: 4 %
HCT: 46.1 % (ref 39.0–52.0)
Hemoglobin: 15.2 g/dL (ref 13.0–17.0)
Immature Granulocytes: 0 %
Lymphocytes Relative: 33 %
Lymphs Abs: 2.8 10*3/uL (ref 0.7–4.0)
MCH: 28.4 pg (ref 26.0–34.0)
MCHC: 33 g/dL (ref 30.0–36.0)
MCV: 86 fL (ref 80.0–100.0)
Monocytes Absolute: 0.6 10*3/uL (ref 0.1–1.0)
Monocytes Relative: 7 %
Neutro Abs: 4.8 10*3/uL (ref 1.7–7.7)
Neutrophils Relative %: 55 %
Platelets: 195 10*3/uL (ref 150–400)
RBC: 5.36 MIL/uL (ref 4.22–5.81)
RDW: 15.5 % (ref 11.5–15.5)
WBC: 8.6 10*3/uL (ref 4.0–10.5)
nRBC: 0 % (ref 0.0–0.2)

## 2019-05-17 LAB — BASIC METABOLIC PANEL
Anion gap: 10 (ref 5–15)
BUN: 10 mg/dL (ref 6–20)
CO2: 25 mmol/L (ref 22–32)
Calcium: 8.9 mg/dL (ref 8.9–10.3)
Chloride: 100 mmol/L (ref 98–111)
Creatinine, Ser: 0.87 mg/dL (ref 0.61–1.24)
GFR calc Af Amer: >60 mL/min (ref >60)
GFR calc non Af Amer: >60 mL/min (ref >60)
Glucose, Bld: 176 mg/dL — ABNORMAL HIGH (ref 70–99)
Potassium: 3.8 mmol/L (ref 3.5–5.1)
Sodium: 135 mmol/L (ref 135–145)

## 2019-05-17 LAB — SARS CORONAVIRUS 2 (TAT 6-24 HRS): SARS Coronavirus 2: NEGATIVE

## 2019-05-17 LAB — HEMOGLOBIN A1C
Hgb A1c MFr Bld: 8.2 % — ABNORMAL HIGH (ref 4.8–5.6)
Mean Plasma Glucose: 188.64 mg/dL

## 2019-05-17 LAB — GLUCOSE, CAPILLARY: Glucose-Capillary: 186 mg/dL — ABNORMAL HIGH (ref 70–99)

## 2019-05-17 NOTE — Progress Notes (Addendum)
[email or web address] routed via epic to [place] in basket . APP made aware . Chart to be given to APP for f/u

## 2019-05-18 MED ORDER — CLINDAMYCIN PHOSPHATE 900 MG/50ML IV SOLN
900.0000 mg | INTRAVENOUS | Status: AC
  Administered 2019-05-19: 900 mg via INTRAVENOUS
  Filled 2019-05-18: qty 50

## 2019-05-18 MED ORDER — GENTAMICIN SULFATE 40 MG/ML IJ SOLN
5.0000 mg/kg | INTRAVENOUS | Status: AC
  Administered 2019-05-19: 420 mg via INTRAVENOUS
  Filled 2019-05-18: qty 10.5

## 2019-05-18 MED ORDER — BUPIVACAINE LIPOSOME 1.3 % IJ SUSP
20.0000 mL | Freq: Once | INTRAMUSCULAR | Status: DC
  Filled 2019-05-18: qty 20

## 2019-05-18 NOTE — Anesthesia Preprocedure Evaluation (Addendum)
Anesthesia Evaluation  Patient identified by MRN, date of birth, ID band Patient awake    Reviewed: Allergy & Precautions, NPO status , Patient's Chart, lab work & pertinent test results  Airway Mallampati: II  TM Distance: >3 FB Neck ROM: Full    Dental   Pulmonary sleep apnea , Current Smoker,    Pulmonary exam normal        Cardiovascular Normal cardiovascular exam     Neuro/Psych Anxiety Depression Bipolar Disorder    GI/Hepatic GERD  Medicated and Controlled,  Endo/Other  diabetes, Type 2  Renal/GU      Musculoskeletal   Abdominal   Peds  Hematology   Anesthesia Other Findings   Reproductive/Obstetrics                            Anesthesia Physical Anesthesia Plan  ASA: III  Anesthesia Plan: General   Post-op Pain Management:    Induction: Intravenous  PONV Risk Score and Plan: 1 and Ondansetron and Treatment may vary due to age or medical condition  Airway Management Planned: Oral ETT  Additional Equipment:   Intra-op Plan:   Post-operative Plan: Extubation in OR  Informed Consent: I have reviewed the patients History and Physical, chart, labs and discussed the procedure including the risks, benefits and alternatives for the proposed anesthesia with the patient or authorized representative who has indicated his/her understanding and acceptance.       Plan Discussed with: CRNA and Surgeon  Anesthesia Plan Comments: (See PAT note 05/17/2019, Konrad Felix, PA-C)       Anesthesia Quick Evaluation

## 2019-05-18 NOTE — Progress Notes (Signed)
Anesthesia Chart Review    Case: [id number] Date/Time: 05/19/19 0715   Procedures:      ILEOSTOMY REVERSAL (N/A )     Ileocolic and colorectal ansotmosis (N/A )   Anesthesia type: General   Pre-op diagnosis: DIVERTICULAR STRICTURE   Location: [place] [facility] / [facility]   Surgeon: Kinsinger, Arta Bruce, MD      DISCUSSION:38 y.o. current every day smoker (42 pack years) with h/o HLD, Becker's muscular dystrophy, sleep apnea w/o device, headaches, bipolar disorder, depression, DM II, diverticular stricture scheduled for above proceudre 05/19/2019 with Dr. Gurney Maxin.    Glucose 176, A1C 8.2 at PAT visit 05/17/2019.  Forwarded to PCP and Surgeon.   Pt last seen by cardiologist, Quay Burow, MD, 12/22/2014 for follow up for atypical chest pain.  At this visit denies chest pain or shortness of breath.  Per Dr. Kennon Holter note, "He had normal echo in 2005 and Myoview stress test 01/22/11."  Follow up prn.   Anticipate pt can proceed with planned procedure barring acute status change.   VS: BP 139/85   Pulse 90   Temp 37.1 C (Oral)   Resp 16   Ht '5\' 7"'$  (1.702 m)   Wt 110 kg   SpO2 100%   BMI 37.98 kg/m   PROVIDERS: [name], DO is PCP  Quay Burow, MD is Cardiologist  LABS: Labs reviewed: Acceptable for surgery. (all labs ordered are listed, but only abnormal results are displayed)  Labs Reviewed  GLUCOSE, CAPILLARY - Abnormal; Notable for the following components:      Result Value   Glucose-Capillary 186 (*)    All other components within normal limits  BASIC METABOLIC PANEL - Abnormal; Notable for the following components:   Glucose, Bld 176 (*)    All other components within normal limits  HEMOGLOBIN A1C - Abnormal; Notable for the following components:   Hgb A1c MFr Bld 8.2 (*)    All other components within normal limits  CBC WITH DIFFERENTIAL/PLATELET     IMAGES: Chest Xray 11/30/2018 IMPRESSION: No significant interval change in bilateral interstitial  and heterogeneous pulmonary opacity, most conspicuous in the left lung and remains consistent with edema or infection. No new or focal airspace opacity.  EKG: 11/23/2018 Rate 125 bpm Sinus tachycardia Nonspecific T wave abnormality Abnormal ECG No significant change since last tracing   CV:  Past Medical History:  Diagnosis Date  . Allergy   . Anxiety   . Becker's muscular dystrophy ([place])   . Bipolar disorder ([place])    On disability  . Depression   . Diabetes mellitus without complication ([place])    per patient : under control with diet, does not monitor cbg at home   . Headache(784.0)    daily headaches  . Hyperlipidemia   . Neuromuscular disorder ([place])    Beckers muscular dystrophy  . Obesity   . Sleep apnea    Does not tolerate CPAP    Past Surgical History:  Procedure Laterality Date  . APPLICATION OF WOUND VAC N/A 11/23/2018   Procedure: Application Of Wound Vac;  Surgeon: Kinsinger, Arta Bruce, MD;  Location: [place];  Service: General;  Laterality: N/A;  . COLECTOMY N/A 11/23/2018   Procedure: TOTAL COLECTOMY;  Surgeon: Mickeal Skinner, MD;  Location: [place];  Service: General;  Laterality: N/A;  . ILEOSTOMY N/A 11/23/2018   Procedure: ILEOSTOMY;  Surgeon: Kinsinger, Arta Bruce, MD;  Location: [place];  Service: General;  Laterality: N/A;  . NM  MYOCAR PERF WALL MOTION  01/22/2011   protocol: Persantine, moderate reversible inferior defect post stress EF 48%, high risk scan  . TRANSTHORACIC ECHOCARDIOGRAM  07/05/2004   EF=>55% normal study     MEDICATIONS: . ACCU-CHEK FASTCLIX LANCETS MISC  . atorvastatin (LIPITOR) 40 MG tablet  . Blood Glucose Monitoring Suppl (ACCU-CHEK NANO SMARTVIEW) W/DEVICE KIT  . diphenhydramine-acetaminophen (TYLENOL PM) 25-500 MG TABS tablet  . gabapentin (NEURONTIN) 300 MG capsule  . glucose blood (ACCU-CHEK SMARTVIEW) test strip  . lamoTRIgine (LAMICTAL) 25 MG tablet  . LATUDA 80 MG TABS tablet  . omeprazole (PRILOSEC) 40 MG capsule   . PROAIR HFA 108 (90 Base) MCG/ACT inhaler  . traZODone (DESYREL) 50 MG tablet  . VYVANSE 20 MG capsule  . VYVANSE 50 MG capsule   No current facility-administered medications for this encounter.     Maia Plan [facility] Pre-Surgical Testing [phone] 05/18/19  9:21 AM

## 2019-05-19 ENCOUNTER — Inpatient Hospital Stay (HOSPITAL_COMMUNITY): Payer: Medicare Other | Admitting: Physician Assistant

## 2019-05-19 ENCOUNTER — Other Ambulatory Visit: Payer: Self-pay

## 2019-05-19 ENCOUNTER — Encounter (HOSPITAL_COMMUNITY)
Admission: RE | Disposition: A | Discharge status: Home / Self Care | Payer: Self-pay | Source: Home / Self Care | Attending: General Surgery

## 2019-05-19 ENCOUNTER — Inpatient Hospital Stay (HOSPITAL_COMMUNITY): Payer: Medicare Other | Admitting: Certified Registered"

## 2019-05-19 ENCOUNTER — Inpatient Hospital Stay (HOSPITAL_COMMUNITY)
Admission: RE | Admit: 2019-05-19 | Discharge: 2019-05-23 | DRG: 331 | Disposition: A | Discharge status: Home / Self Care | Payer: Medicare Other | Attending: General Surgery | Admitting: General Surgery

## 2019-05-19 ENCOUNTER — Encounter (HOSPITAL_COMMUNITY): Payer: Self-pay | Admitting: *Deleted

## 2019-05-19 DIAGNOSIS — G7101 Duchenne or Becker muscular dystrophy: Secondary | ICD-10-CM | POA: Diagnosis present

## 2019-05-19 DIAGNOSIS — Z825 Family history of asthma and other chronic lower respiratory diseases: Secondary | ICD-10-CM

## 2019-05-19 DIAGNOSIS — Z432 Encounter for attention to ileostomy: Principal | ICD-10-CM

## 2019-05-19 DIAGNOSIS — F319 Bipolar disorder, unspecified: Secondary | ICD-10-CM | POA: Diagnosis present

## 2019-05-19 DIAGNOSIS — Z8349 Family history of other endocrine, nutritional and metabolic diseases: Secondary | ICD-10-CM | POA: Diagnosis not present

## 2019-05-19 DIAGNOSIS — Z818 Family history of other mental and behavioral disorders: Secondary | ICD-10-CM

## 2019-05-19 DIAGNOSIS — K219 Gastro-esophageal reflux disease without esophagitis: Secondary | ICD-10-CM | POA: Diagnosis present

## 2019-05-19 DIAGNOSIS — Z888 Allergy status to other drugs, medicaments and biological substances status: Secondary | ICD-10-CM | POA: Diagnosis not present

## 2019-05-19 DIAGNOSIS — Z833 Family history of diabetes mellitus: Secondary | ICD-10-CM

## 2019-05-19 DIAGNOSIS — F17210 Nicotine dependence, cigarettes, uncomplicated: Secondary | ICD-10-CM | POA: Diagnosis present

## 2019-05-19 DIAGNOSIS — K56609 Unspecified intestinal obstruction, unspecified as to partial versus complete obstruction: Secondary | ICD-10-CM | POA: Diagnosis not present

## 2019-05-19 DIAGNOSIS — K5730 Diverticulosis of large intestine without perforation or abscess without bleeding: Secondary | ICD-10-CM | POA: Diagnosis not present

## 2019-05-19 DIAGNOSIS — Z881 Allergy status to other antibiotic agents status: Secondary | ICD-10-CM | POA: Diagnosis not present

## 2019-05-19 DIAGNOSIS — G4730 Sleep apnea, unspecified: Secondary | ICD-10-CM | POA: Diagnosis present

## 2019-05-19 DIAGNOSIS — R000 Tachycardia, unspecified: Secondary | ICD-10-CM | POA: Diagnosis not present

## 2019-05-19 DIAGNOSIS — K660 Peritoneal adhesions (postprocedural) (postinfection): Secondary | ICD-10-CM | POA: Diagnosis present

## 2019-05-19 DIAGNOSIS — E785 Hyperlipidemia, unspecified: Secondary | ICD-10-CM | POA: Diagnosis present

## 2019-05-19 DIAGNOSIS — E119 Type 2 diabetes mellitus without complications: Secondary | ICD-10-CM | POA: Diagnosis present

## 2019-05-19 DIAGNOSIS — Z880 Allergy status to penicillin: Secondary | ICD-10-CM | POA: Diagnosis not present

## 2019-05-19 DIAGNOSIS — Z8249 Family history of ischemic heart disease and other diseases of the circulatory system: Secondary | ICD-10-CM

## 2019-05-19 DIAGNOSIS — Z8261 Family history of arthritis: Secondary | ICD-10-CM

## 2019-05-19 DIAGNOSIS — Z932 Ileostomy status: Secondary | ICD-10-CM | POA: Diagnosis not present

## 2019-05-19 HISTORY — PX: ILEOSTOMY CLOSURE: SHX1784

## 2019-05-19 LAB — GLUCOSE, CAPILLARY
Glucose-Capillary: 164 mg/dL — ABNORMAL HIGH (ref 70–99)
Glucose-Capillary: 231 mg/dL — ABNORMAL HIGH (ref 70–99)
Glucose-Capillary: 254 mg/dL — ABNORMAL HIGH (ref 70–99)

## 2019-05-19 SURGERY — CLOSURE, ILEOSTOMY
Anesthesia: General | Site: Abdomen

## 2019-05-19 MED ORDER — GABAPENTIN 300 MG PO CAPS
300.0000 mg | ORAL_CAPSULE | Freq: Two times a day (BID) | ORAL | Status: DC
  Administered 2019-05-19 – 2019-05-22 (×7): 300 mg via ORAL
  Filled 2019-05-19 (×7): qty 1

## 2019-05-19 MED ORDER — ESMOLOL HCL 100 MG/10ML IV SOLN
INTRAVENOUS | Status: DC | PRN
  Administered 2019-05-19: 30 mg via INTRAVENOUS
  Administered 2019-05-19: 20 mg via INTRAVENOUS
  Administered 2019-05-19: 30 mg via INTRAVENOUS
  Administered 2019-05-19: 20 mg via INTRAVENOUS
  Administered 2019-05-19: 50 mg via INTRAVENOUS
  Administered 2019-05-19: 30 mg via INTRAVENOUS
  Administered 2019-05-19: 20 mg via INTRAVENOUS

## 2019-05-19 MED ORDER — ROCURONIUM BROMIDE 10 MG/ML (PF) SYRINGE
PREFILLED_SYRINGE | INTRAVENOUS | Status: AC
  Filled 2019-05-19: qty 10

## 2019-05-19 MED ORDER — HYDROMORPHONE HCL 1 MG/ML IJ SOLN
INTRAMUSCULAR | Status: AC
  Filled 2019-05-19: qty 2

## 2019-05-19 MED ORDER — MEPERIDINE HCL 50 MG/ML IJ SOLN: 6.2500 mg | INTRAMUSCULAR | Status: DC | PRN

## 2019-05-19 MED ORDER — HYDROMORPHONE HCL 1 MG/ML IJ SOLN
0.2500 mg | INTRAMUSCULAR | Status: DC | PRN
  Administered 2019-05-19 (×2): 0.5 mg via INTRAVENOUS

## 2019-05-19 MED ORDER — ESMOLOL HCL 100 MG/10ML IV SOLN
INTRAVENOUS | Status: AC
  Filled 2019-05-19: qty 10

## 2019-05-19 MED ORDER — LIDOCAINE 2% (20 MG/ML) 5 ML SYRINGE
INTRAMUSCULAR | Status: DC | PRN
  Administered 2019-05-19: 100 mg via INTRAVENOUS

## 2019-05-19 MED ORDER — LIDOCAINE 2% (20 MG/ML) 5 ML SYRINGE
INTRAMUSCULAR | Status: AC
  Filled 2019-05-19: qty 5

## 2019-05-19 MED ORDER — ROCURONIUM BROMIDE 10 MG/ML (PF) SYRINGE
PREFILLED_SYRINGE | INTRAVENOUS | Status: DC | PRN
  Administered 2019-05-19: 20 mg via INTRAVENOUS
  Administered 2019-05-19 (×3): 10 mg via INTRAVENOUS
  Administered 2019-05-19: 60 mg via INTRAVENOUS
  Administered 2019-05-19: 20 mg via INTRAVENOUS

## 2019-05-19 MED ORDER — ONDANSETRON HCL 4 MG/2ML IJ SOLN: 4.0000 mg | Freq: Four times a day (QID) | INTRAMUSCULAR | Status: DC | PRN

## 2019-05-19 MED ORDER — EPHEDRINE 5 MG/ML INJ
INTRAVENOUS | Status: AC
  Filled 2019-05-19: qty 10

## 2019-05-19 MED ORDER — SODIUM CHLORIDE 0.9 % IV BOLUS
1000.0000 mL | Freq: Once | INTRAVENOUS | Status: AC
  Administered 2019-05-19: 1000 mL via INTRAVENOUS

## 2019-05-19 MED ORDER — FENTANYL CITRATE (PF) 100 MCG/2ML IJ SOLN
INTRAMUSCULAR | Status: DC | PRN
  Administered 2019-05-19 (×3): 50 ug via INTRAVENOUS
  Administered 2019-05-19: 25 ug via INTRAVENOUS
  Administered 2019-05-19: 50 ug via INTRAVENOUS
  Administered 2019-05-19: 25 ug via INTRAVENOUS
  Administered 2019-05-19 (×2): 50 ug via INTRAVENOUS
  Administered 2019-05-19: 25 ug via INTRAVENOUS
  Administered 2019-05-19 (×3): 50 ug via INTRAVENOUS
  Administered 2019-05-19: 25 ug via INTRAVENOUS
  Administered 2019-05-19 (×3): 50 ug via INTRAVENOUS

## 2019-05-19 MED ORDER — FENTANYL CITRATE (PF) 100 MCG/2ML IJ SOLN
INTRAMUSCULAR | Status: AC
  Filled 2019-05-19: qty 2

## 2019-05-19 MED ORDER — SUGAMMADEX SODIUM 200 MG/2ML IV SOLN
INTRAVENOUS | Status: DC | PRN
  Administered 2019-05-19: 300 mg via INTRAVENOUS

## 2019-05-19 MED ORDER — BUPIVACAINE-EPINEPHRINE (PF) 0.25% -1:200000 IJ SOLN
INTRAMUSCULAR | Status: AC
  Filled 2019-05-19: qty 30

## 2019-05-19 MED ORDER — PROPOFOL 10 MG/ML IV BOLUS
INTRAVENOUS | Status: AC
  Filled 2019-05-19: qty 20

## 2019-05-19 MED ORDER — ACETAMINOPHEN 325 MG PO TABS
650.0000 mg | ORAL_TABLET | Freq: Four times a day (QID) | ORAL | Status: DC | PRN
  Administered 2019-05-21: 650 mg via ORAL
  Filled 2019-05-19: qty 2

## 2019-05-19 MED ORDER — ONDANSETRON HCL 4 MG/2ML IJ SOLN: 4.0000 mg | Freq: Once | INTRAMUSCULAR | Status: DC | PRN

## 2019-05-19 MED ORDER — CHLORHEXIDINE GLUCONATE 4 % EX LIQD: 60.0000 mL | Freq: Once | CUTANEOUS | Status: DC

## 2019-05-19 MED ORDER — SUGAMMADEX SODIUM 500 MG/5ML IV SOLN
INTRAVENOUS | Status: AC
  Filled 2019-05-19: qty 5

## 2019-05-19 MED ORDER — ONDANSETRON HCL 4 MG/2ML IJ SOLN
INTRAMUSCULAR | Status: DC | PRN
  Administered 2019-05-19: 4 mg via INTRAVENOUS

## 2019-05-19 MED ORDER — ENOXAPARIN SODIUM 40 MG/0.4ML ~~LOC~~ SOLN
40.0000 mg | Freq: Once | SUBCUTANEOUS | Status: AC
  Administered 2019-05-19: 40 mg via SUBCUTANEOUS
  Filled 2019-05-19: qty 0.4

## 2019-05-19 MED ORDER — LIDOCAINE 20MG/ML (2%) 15 ML SYRINGE OPTIME
INTRAMUSCULAR | Status: DC | PRN
  Administered 2019-05-19: 1.5 mg/kg/h via INTRAVENOUS

## 2019-05-19 MED ORDER — METOPROLOL TARTRATE 5 MG/5ML IV SOLN: 5.0000 mg | Freq: Four times a day (QID) | INTRAVENOUS | Status: DC | PRN

## 2019-05-19 MED ORDER — ACETAMINOPHEN 500 MG PO TABS
1000.0000 mg | ORAL_TABLET | ORAL | Status: AC
  Administered 2019-05-19: 1000 mg via ORAL
  Filled 2019-05-19: qty 2

## 2019-05-19 MED ORDER — KETOROLAC TROMETHAMINE 30 MG/ML IJ SOLN
30.0000 mg | Freq: Four times a day (QID) | INTRAMUSCULAR | Status: DC
  Administered 2019-05-19 – 2019-05-23 (×13): 30 mg via INTRAVENOUS
  Filled 2019-05-19 (×13): qty 1

## 2019-05-19 MED ORDER — DIPHENHYDRAMINE HCL 25 MG PO CAPS: 25.0000 mg | ORAL_CAPSULE | Freq: Four times a day (QID) | ORAL | Status: DC | PRN

## 2019-05-19 MED ORDER — 0.9 % SODIUM CHLORIDE (POUR BTL) OPTIME
TOPICAL | Status: DC | PRN
  Administered 2019-05-19: 09:00:00 4000 mL

## 2019-05-19 MED ORDER — ONDANSETRON HCL 4 MG PO TABS: 4.0000 mg | ORAL_TABLET | Freq: Four times a day (QID) | ORAL | Status: DC | PRN

## 2019-05-19 MED ORDER — ENOXAPARIN SODIUM 40 MG/0.4ML ~~LOC~~ SOLN
40.0000 mg | SUBCUTANEOUS | Status: DC
  Administered 2019-05-20 – 2019-05-23 (×4): 40 mg via SUBCUTANEOUS
  Filled 2019-05-19 (×4): qty 0.4

## 2019-05-19 MED ORDER — LACTATED RINGERS IV SOLN
INTRAVENOUS | Status: DC
  Administered 2019-05-19 (×4): via INTRAVENOUS

## 2019-05-19 MED ORDER — ALVIMOPAN 12 MG PO CAPS
12.0000 mg | ORAL_CAPSULE | ORAL | Status: AC
  Administered 2019-05-19: 12 mg via ORAL
  Filled 2019-05-19: qty 1

## 2019-05-19 MED ORDER — PROPOFOL 10 MG/ML IV BOLUS
INTRAVENOUS | Status: AC
  Filled 2019-05-19: qty 40

## 2019-05-19 MED ORDER — SODIUM CHLORIDE 0.45 % IV SOLN
INTRAVENOUS | Status: DC
  Administered 2019-05-19 – 2019-05-20 (×3): via INTRAVENOUS

## 2019-05-19 MED ORDER — OXYCODONE HCL 5 MG PO TABS
5.0000 mg | ORAL_TABLET | Freq: Four times a day (QID) | ORAL | Status: DC | PRN
  Administered 2019-05-19 – 2019-05-23 (×8): 5 mg via ORAL
  Filled 2019-05-19 (×10): qty 1

## 2019-05-19 MED ORDER — SUCCINYLCHOLINE CHLORIDE 200 MG/10ML IV SOSY
PREFILLED_SYRINGE | INTRAVENOUS | Status: AC
  Filled 2019-05-19: qty 10

## 2019-05-19 MED ORDER — PROPOFOL 10 MG/ML IV BOLUS
INTRAVENOUS | Status: DC | PRN
  Administered 2019-05-19: 200 mg via INTRAVENOUS

## 2019-05-19 MED ORDER — ESMOLOL HCL 100 MG/10ML IV SOLN
INTRAVENOUS | Status: AC
  Filled 2019-05-19: qty 20

## 2019-05-19 MED ORDER — FENTANYL CITRATE (PF) 250 MCG/5ML IJ SOLN
INTRAMUSCULAR | Status: AC
  Filled 2019-05-19: qty 5

## 2019-05-19 MED ORDER — MIDAZOLAM HCL 5 MG/5ML IJ SOLN
INTRAMUSCULAR | Status: DC | PRN
  Administered 2019-05-19: 2 mg via INTRAVENOUS

## 2019-05-19 MED ORDER — CELECOXIB 200 MG PO CAPS
200.0000 mg | ORAL_CAPSULE | ORAL | Status: AC
  Administered 2019-05-19: 200 mg via ORAL
  Filled 2019-05-19: qty 1

## 2019-05-19 MED ORDER — ENSURE SURGERY PO LIQD
237.0000 mL | Freq: Two times a day (BID) | ORAL | Status: DC
  Administered 2019-05-21 – 2019-05-22 (×3): 237 mL via ORAL
  Filled 2019-05-19 (×8): qty 237

## 2019-05-19 MED ORDER — PHENYLEPHRINE 40 MCG/ML (10ML) SYRINGE FOR IV PUSH (FOR BLOOD PRESSURE SUPPORT)
PREFILLED_SYRINGE | INTRAVENOUS | Status: AC
  Filled 2019-05-19: qty 10

## 2019-05-19 MED ORDER — DEXAMETHASONE SODIUM PHOSPHATE 10 MG/ML IJ SOLN
INTRAMUSCULAR | Status: AC
  Filled 2019-05-19: qty 1

## 2019-05-19 MED ORDER — DIPHENHYDRAMINE HCL 50 MG/ML IJ SOLN: 25.0000 mg | Freq: Four times a day (QID) | INTRAMUSCULAR | Status: DC | PRN

## 2019-05-19 MED ORDER — ALVIMOPAN 12 MG PO CAPS
12.0000 mg | ORAL_CAPSULE | Freq: Two times a day (BID) | ORAL | Status: DC
  Administered 2019-05-20 – 2019-05-22 (×4): 12 mg via ORAL
  Filled 2019-05-19 (×5): qty 1

## 2019-05-19 MED ORDER — ONDANSETRON HCL 4 MG/2ML IJ SOLN
INTRAMUSCULAR | Status: AC
  Filled 2019-05-19: qty 2

## 2019-05-19 MED ORDER — MIDAZOLAM HCL 2 MG/2ML IJ SOLN
INTRAMUSCULAR | Status: AC
  Filled 2019-05-19: qty 2

## 2019-05-19 MED ORDER — MORPHINE SULFATE (PF) 2 MG/ML IV SOLN
2.0000 mg | INTRAVENOUS | Status: DC | PRN
  Administered 2019-05-19 (×2): 2 mg via INTRAVENOUS
  Administered 2019-05-20: 4 mg via INTRAVENOUS
  Administered 2019-05-20 (×2): 2 mg via INTRAVENOUS
  Administered 2019-05-20: 4 mg via INTRAVENOUS
  Administered 2019-05-21 – 2019-05-22 (×5): 2 mg via INTRAVENOUS
  Administered 2019-05-22: 3 mg via INTRAVENOUS
  Administered 2019-05-22 – 2019-05-23 (×2): 2 mg via INTRAVENOUS
  Filled 2019-05-19 (×4): qty 1
  Filled 2019-05-19: qty 2
  Filled 2019-05-19 (×6): qty 1
  Filled 2019-05-19 (×2): qty 2
  Filled 2019-05-19 (×2): qty 1

## 2019-05-19 MED ORDER — GABAPENTIN 300 MG PO CAPS
300.0000 mg | ORAL_CAPSULE | ORAL | Status: AC
  Administered 2019-05-19: 300 mg via ORAL
  Filled 2019-05-19: qty 1

## 2019-05-19 MED ORDER — DEXAMETHASONE SODIUM PHOSPHATE 10 MG/ML IJ SOLN
INTRAMUSCULAR | Status: DC | PRN
  Administered 2019-05-19: 10 mg via INTRAVENOUS

## 2019-05-19 SURGICAL SUPPLY — 55 items
BLADE HEX COATED 2.75 (ELECTRODE) ×3 IMPLANT
COVER MAYO STAND STRL (DRAPES) ×5 IMPLANT
COVER WAND RF STERILE (DRAPES) IMPLANT
DRAPE UTILITY XL STRL (DRAPES) IMPLANT
DRESSING PREVENA PLUS CUSTOM (GAUZE/BANDAGES/DRESSINGS) IMPLANT
DRSG OPSITE POSTOP 4X10 (GAUZE/BANDAGES/DRESSINGS) IMPLANT
DRSG OPSITE POSTOP 4X6 (GAUZE/BANDAGES/DRESSINGS) IMPLANT
DRSG OPSITE POSTOP 4X8 (GAUZE/BANDAGES/DRESSINGS) IMPLANT
DRSG PREVENA PLUS CUSTOM (GAUZE/BANDAGES/DRESSINGS) ×3
DRSG TELFA 3X8 NADH (GAUZE/BANDAGES/DRESSINGS) IMPLANT
ELECT REM PT RETURN 15FT ADLT (MISCELLANEOUS) ×3 IMPLANT
EVACUATOR SILICONE 100CC (DRAIN) ×2 IMPLANT
GAUZE SPONGE 4X4 12PLY STRL (GAUZE/BANDAGES/DRESSINGS) ×5 IMPLANT
GLOVE BIOGEL PI IND STRL 7.0 (GLOVE) ×1 IMPLANT
GLOVE BIOGEL PI INDICATOR 7.0 (GLOVE) ×2
GLOVE SURG SS PI 7.0 STRL IVOR (GLOVE) ×3 IMPLANT
GOWN STRL REUS W/TWL LRG LVL3 (GOWN DISPOSABLE) ×3 IMPLANT
GOWN STRL REUS W/TWL XL LVL3 (GOWN DISPOSABLE) IMPLANT
HANDLE SUCTION POOLE (INSTRUMENTS) ×1 IMPLANT
HOLDER FOLEY CATH W/STRAP (MISCELLANEOUS) IMPLANT
KIT BASIN OR (CUSTOM PROCEDURE TRAY) ×3 IMPLANT
KIT DRSG PREVENA PLUS 7DAY 125 (MISCELLANEOUS) ×2 IMPLANT
KIT TURNOVER KIT A (KITS) IMPLANT
LIGASURE IMPACT 36 18CM CVD LR (INSTRUMENTS) ×2 IMPLANT
MANIFOLD NEPTUNE II (INSTRUMENTS) ×3 IMPLANT
PACK COLON (CUSTOM PROCEDURE TRAY) ×2 IMPLANT
PAD DRESSING TELFA 3X8 NADH (GAUZE/BANDAGES/DRESSINGS) IMPLANT
RELOAD PROXIMATE 75MM BLUE (ENDOMECHANICALS) IMPLANT
RELOAD STAPLE 75 3.8 BLU REG (ENDOMECHANICALS) IMPLANT
SPONGE LAP 18X18 RF (DISPOSABLE) ×6 IMPLANT
STAPLER 90 3.5 STAND SLIM (STAPLE) ×3
STAPLER 90 3.5 STD SLIM (STAPLE) IMPLANT
STAPLER ECHELON POWER CIR 29 (STAPLE) ×2 IMPLANT
STAPLER GUN LINEAR PROX 60 (STAPLE) IMPLANT
STAPLER PROXIMATE 75MM BLUE (STAPLE) ×2 IMPLANT
SUCTION POOLE HANDLE (INSTRUMENTS) ×3
SUT NOVA NAB DX-16 0-1 5-0 T12 (SUTURE) IMPLANT
SUT NOVA NAB GS-21 0 18 T12 DT (SUTURE) ×6 IMPLANT
SUT PDS AB 0 CTX 36 PDP370T (SUTURE) ×6 IMPLANT
SUT PDS AB 0 CTX 60 (SUTURE) ×6 IMPLANT
SUT PROLENE 2 0 BLUE (SUTURE) IMPLANT
SUT SILK 2 0 (SUTURE) ×3
SUT SILK 2 0 SH CR/8 (SUTURE) ×3 IMPLANT
SUT SILK 2-0 18XBRD TIE 12 (SUTURE) ×1 IMPLANT
SUT SILK 3 0 (SUTURE) ×3
SUT SILK 3 0 SH CR/8 (SUTURE) ×3 IMPLANT
SUT SILK 3-0 18XBRD TIE 12 (SUTURE) ×1 IMPLANT
SUT VIC AB 2-0 SH 18 (SUTURE) IMPLANT
SUT VIC AB 2-0 SH 27 (SUTURE) ×6
SUT VIC AB 2-0 SH 27X BRD (SUTURE) ×2 IMPLANT
SUT VIC AB 4-0 PS2 18 (SUTURE) ×3 IMPLANT
TAPE CLOTH SURG 4X10 WHT LF (GAUZE/BANDAGES/DRESSINGS) ×4 IMPLANT
TOWEL OR 17X26 10 PK STRL BLUE (TOWEL DISPOSABLE) ×6 IMPLANT
TOWEL OR NON WOVEN STRL DISP B (DISPOSABLE) ×6 IMPLANT
YANKAUER SUCT BULB TIP NO VENT (SUCTIONS) ×3 IMPLANT

## 2019-05-19 NOTE — Anesthesia Procedure Notes (Addendum)
Procedure Name: Intubation Date/Time: 05/19/2019 7:49 AM Performed by: Silas [place], CRNA Pre-anesthesia Checklist: Patient identified, Emergency Drugs available, Suction available and Patient being monitored Patient Re-evaluated:Patient Re-evaluated prior to induction Oxygen Delivery Method: Circle system utilized Preoxygenation: Pre-oxygenation with 100% oxygen Induction Type: IV induction Ventilation: Mask ventilation without difficulty Laryngoscope Size: Mac and 4 Grade View: Grade I Tube type: Oral Tube size: 7.5 mm Number of attempts: 1 Airway Equipment and Method: Stylet and Oral airway Placement Confirmation: ETT inserted through vocal cords under direct vision,  positive ETCO2 and breath sounds checked- equal and bilateral Secured at: 23 cm Tube secured with: Tape Dental Injury: Teeth and Oropharynx as per pre-operative assessment

## 2019-05-19 NOTE — Transfer of Care (Signed)
Immediate Anesthesia Transfer of Care Note  Patient: [name]  Procedure(s) Performed: ILEOSTOMY REVERSAL WITH ILEOCOLIC AND COLORECTAL ANASTOMOSIS (N/A Abdomen)  Patient Location: PACU  Anesthesia Type:General  Level of Consciousness: sedated, patient cooperative and responds to stimulation  Airway & Oxygen Therapy: Patient Spontanous Breathing and Patient connected to face mask oxygen  Post-op Assessment: Report given to RN and Post -op Vital signs reviewed and stable  Post vital signs: Reviewed and stable  Last Vitals:  Vitals Value Taken Time  BP 137/105 05/19/19 1241  Temp    Pulse 114 05/19/19 1244  Resp 10 05/19/19 1244  SpO2 100 % 05/19/19 1244  Vitals shown include unvalidated device data.  Last Pain:  Vitals:   05/19/19 0546  TempSrc: Oral         Complications: No apparent anesthesia complications

## 2019-05-19 NOTE — Progress Notes (Addendum)
MD on call notified that patient's pulse rate has been ranging from 110-134 since post op. Patient has had pain score ranging from 5-7 on scale of 0-10. Patient has been administered pain medications when due. MD ordered 1000 Nacl bolus to be given. MD suggested starting patient on PCA pump to help relieve pain and patient declined. Will administer bolus and continue to monitor patient's vitals. Patient is currently stable and resting.  MEWS score of 3 due to pulse rate. Dawson Bills, RN

## 2019-05-19 NOTE — H&P (Signed)
[name] is an 38 y.o. male.   Chief Complaint: ostomy HPI: 38 yo male presented with diverticular stricture with cecal perforation. He underwent resection of cecum and sigmoid colon with ileostomy and mucous fistula. He has now recovered and presents for reversal.  Past Medical History:  Diagnosis Date  . Allergy   . Anxiety   . 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 Bipolar disorder ([place])    On disability  . Depression   . Diabetes mellitus without complication ([place])    per patient : under control with diet, does not monitor cbg at home   . Headache(784.0)    daily headaches  . Hyperlipidemia   . Neuromuscular disorder ([place])    Beckers muscular dystrophy  . Obesity   . Sleep apnea    Does not tolerate CPAP    Past Surgical History:  Procedure Laterality Date  . APPLICATION OF WOUND VAC N/A 11/23/2018   Procedure: Application Of Wound Vac;  Surgeon: , Arta Bruce, MD;  Location: [place];  Service: General;  Laterality: N/A;  . COLECTOMY N/A 11/23/2018   Procedure: TOTAL COLECTOMY;  Surgeon: Kieth Brightly Arta Bruce, MD;  Location: [place];  Service: General;  Laterality: N/A;  . ILEOSTOMY N/A 11/23/2018   Procedure: ILEOSTOMY;  Surgeon: , Arta Bruce, MD;  Location: [place];  Service: General;  Laterality: N/A;  . NM MYOCAR PERF WALL MOTION  01/22/2011   protocol: Persantine, moderate reversible inferior defect post stress EF 48%, high risk scan  . TRANSTHORACIC ECHOCARDIOGRAM  07/05/2004   EF=>55% normal study     Family History  Problem Relation Age of Onset  . COPD Mother   . Depression Mother   . Diabetes Mother   . Hyperlipidemia Mother   . Asthma Father   . Arthritis Father   . Diabetes Father   . Heart disease Father   . Hyperlipidemia Father   . Hypertension Father   . Hypertension Brother    Social History:  reports that he has been smoking cigarettes. He has a 42.00 pack-year smoking history. He has never used smokeless tobacco. He reports current  drug use. Drug: Marijuana. He reports that he does not drink alcohol.  Allergies:  Allergies  Allergen Reactions  . Penicillins Anaphylaxis, Shortness Of Breath and Swelling    Did it involve swelling of the face/tongue/throat, SOB, or low BP? Yes Did it involve sudden or severe rash/hives, skin peeling, or any reaction on the inside of your mouth or nose? Unk Did you need to seek medical attention at a hospital or doctor's office? No When did it last happen?Unk If all above answers are "NO", may proceed with cephalosporin use. Got ceftriaxone before   . Amoxicillin Hives    Per patient's mother  . Metformin And Related Other (See Comments)    Stomach cramps     Medications Prior to Admission  Medication Sig Dispense Refill  . diphenhydramine-acetaminophen (TYLENOL PM) 25-500 MG TABS tablet Take 3 tablets by mouth at bedtime.    . gabapentin (NEURONTIN) 300 MG capsule Take 300 mg by mouth daily.   1  . LATUDA 80 MG TABS tablet Take 80 mg by mouth at bedtime.     Marland Kitchen omeprazole (PRILOSEC) 40 MG capsule Take 1 capsule (40 mg total) by mouth at bedtime. 90 capsule 1  . PROAIR HFA 108 (90 Base) MCG/ACT inhaler INHALE 2 PUFFS BY MOUTH EVERY 6 HOURS AS NEEDED FOR WHEEZE OR SHORTNESS OF BREATH (Patient taking differently:  Inhale 2 puffs into the lungs every 6 (six) hours as needed for wheezing or shortness of breath. ) 8.5 g 0  . traZODone (DESYREL) 50 MG tablet Take 50 mg by mouth at bedtime.     Marland Kitchen VYVANSE 20 MG capsule Take 20 mg by mouth daily with lunch.     Marland Kitchen VYVANSE 50 MG capsule Take 50 mg by mouth daily with breakfast.     . ACCU-CHEK FASTCLIX LANCETS MISC Check blood sugar once daily 102 each 3  . atorvastatin (LIPITOR) 40 MG tablet Take 1 tablet (40 mg total) by mouth daily. (Patient taking differently: Take 40 mg by mouth at bedtime. ) 90 tablet 1  . Blood Glucose Monitoring Suppl (ACCU-CHEK NANO SMARTVIEW) W/DEVICE KIT Check blood sugar once daily 1 kit 0  . glucose blood  (ACCU-CHEK SMARTVIEW) test strip Check blood sugar once daily 100 each 3  . lamoTRIgine (LAMICTAL) 25 MG tablet Take 1 tab daily for 1 week and than 2 tab daily (Patient not taking: Reported on 05/11/2019) 60 tablet 2    Results for orders placed or performed during the hospital encounter of 05/19/19 (from the past 48 hour(s))  Glucose, capillary     Status: Abnormal   Collection Time: 05/19/19  6:24 AM  Result Value Ref Range   Glucose-Capillary 164 (H) 70 - 99 mg/dL   Comment 1 Notify RN    Comment 2 Document in Chart    No results found.  Review of Systems  Constitutional: Negative for chills and fever.  HENT: Negative for hearing loss.   Eyes: Negative for blurred vision and double vision.  Respiratory: Negative for cough and hemoptysis.   Cardiovascular: Negative for chest pain and palpitations.  Gastrointestinal: Negative for abdominal pain, nausea and vomiting.  Genitourinary: Negative for dysuria and urgency.  Musculoskeletal: Negative for myalgias and neck pain.  Skin: Negative for itching and rash.  Neurological: Negative for dizziness, tingling and headaches.  Endo/Heme/Allergies: Does not bruise/bleed easily.  Psychiatric/Behavioral: Negative for depression and suicidal ideas.    Blood pressure 131/87, pulse (!) 108, temperature 98.5 F (36.9 C), temperature source Oral, resp. rate 17, SpO2 100 %. Physical Exam  Vitals reviewed. Constitutional: He is oriented to person, place, and time. He appears well-developed and well-nourished.  HENT:  Head: Normocephalic and atraumatic.  Eyes: Pupils are equal, round, and reactive to light. Conjunctivae and EOM are normal.  Neck: Normal range of motion. Neck supple.  Cardiovascular: Normal rate and regular rhythm.  Respiratory: Effort normal and breath sounds normal.  GI: Soft. Bowel sounds are normal. He exhibits no distension. There is no abdominal tenderness.  Right sided ileostomy and mucous fistula  Musculoskeletal:  Normal range of motion.  Neurological: He is alert and oriented to person, place, and time.  Skin: Skin is warm and dry.  Psychiatric: He has a normal mood and affect. His behavior is normal.     Assessment/Plan 38 yo male with end ileostomy and mucous fistula from diverticulitis -open surgery for reversal with colorectal and ileocolic anastomosis -colorectal protocol -inpatient admission  Mickeal Skinner, MD 05/19/2019, 7:13 AM

## 2019-05-19 NOTE — Op Note (Signed)
Preoperative diagnosis: diverticulitis, ileostomy care  Postoperative diagnosis: same   Procedure: ileostomy reversal with ileocolic anastomosis and colorectal anastomosis  Surgeon: Gurney Maxin, M.D.  Asst: Leighton Ruff  Anesthesia: general  Indications for procedure: [name] is a 38 y.o. year old male with diverticulitis who underwent emergent surgery 6 months ago with resection of cecum and sigmoid colon due to sigmoid stricture with cecal perforation.  Description of procedure: The patient was brought into the operative suite. Anesthesia was administered with General endotracheal anesthesia. WHO checklist was applied. The patient was then placed in lithotomy position. The area was prepped and draped in the usual sterile fashion.  Next, a midline incision was made and cautery was used to dissect down through subcutaneous tissues and the fascia was identified and divided in the midline.  Upon initial entry there were moderate adhesions in the right abdomen to the abdominal wall and small intestine appeared normal.  We began by dissection by lysing adhesions of the small intestine to the left sidewall as well as the pelvis.  This was done both bluntly and with sharp technique. This allowed visualization of the rectal stump with multiple Prolenes visualized as well as the distal left colon with Prolenes again.  Next, I took down the white line of Toldt moving towards the spleen with cautery.  Continued dissection of the splenic flexure largely with cautery.  There are also multiple adhesions of the small intestine up to the transverse colon these were divided.  Attachments of the omentum to the transverse colon were also divided in order to get freedom of the colon to come down into the pelvis for anastomosis.  Once this was complete, I turned my attention towards the mucous fistula in the right upper abdomen.  First adhesions were taken down of the right abdominal wall with blunt dissection  and cautery.  Next, elliptical incision was made around the mucous fistula and cautery was used to dissect down through subcutaneous tissues and divide adhesions around the colon.  Colon was then brought back into the abdomen and additional scar tissue was removed such that the colon was freed.  Additionally, blunt dissection and sharp dissection was used to partially free the hepatic flexure.  Next, a takedown the right lower ileostomy by making an elliptical incision and then using cautery dissect down to free the small intestine away from the adhesions of the abdominal wall.  This is complete and then dunked the ileum back into the abdomen and ensured that it was completely free of adhesions to the abdominal wall.  At this time I also freed additional adhesions of the small intestine from the right lateral wall such that the terminal ileum and transverse colon with sit next to each other for anastomosis.  And shows appropriate healthy portions of the ileum and proximal colon for anastomosis and performed anastomosis by side side-to-side anastomosis using a 75 mm GIA stapler and close the defect with a 90 mm blue TA stapler.  A 3-0 silk was used as a anti-tension stitch.  Additional 3-0 silk was used for hemostasis of the staple line x1.  Next, the distal left colon was reinspected and a pursestring device was used to create a pursestring with a 0 Prolene.  Pursestring was secured in with 3 oh silks in interrupted fashion in the distal aspect of the colon was divided.  Colon was then dilated up to a 29 mm lumen.  Anvil was put in place and the pursestring was secured around the  anvil.  Next, Dr. Marcello Moores went down below and dilated the anus and rectum to put a stapler in the proximal rectum.  There is some difficulty getting it all the way to the proximal rectum so decision was made to perform a anterior stapling position of the proximal rectum.  29 mm EEA stapler was used to create a colorectal anastomosis at  approximately 18 cm from the anal verge.  Leak test was performed with no leaks.  Donuts donuts were inspected and were complete.  Next, the abdomen was irrigated with 2 L of warm saline.  Manderson drain was introduced through the right lower quadrant and placed on the right abdominal wall and pelvis.  The ileostomy and colostomy fascial defects were closed with 0 PDS in running fashion.  The omentum was put back in a midline position.  The midline fascial incision was then closed with 0 PDS in running fashion.  Staples were used to close the skin of the midline incision.  A pursestring was used to decrease the size of the incisions of the ileostomy and mucous fistula skin.  A Praveena wound VAC was placed over the midline incision.  Saline Kerlex was packed into the ileostomy and mucous fistula subcutaneous areas.  Patient will from anesthesia was brought to PACU in stable condition.  All counts are correct.  Findings: intact colorectal anastomosis at 32XG, intact ileocolic anastomosis, moderate adhesive disease requiring 1 hour of lysis of adhesions  Specimen: ileostomy and colon  Implant: 19 fr blake drain in right abdomen and pelvis, provena wound vac   Blood loss: 350  Local anesthesia: none  Complications: none  Gurney Maxin, M.D. General, Bariatric, & Minimally Invasive Surgery [facility]

## 2019-05-20 ENCOUNTER — Encounter (HOSPITAL_COMMUNITY): Payer: Self-pay | Admitting: General Surgery

## 2019-05-20 LAB — CBC
HCT: 40.2 % (ref 39.0–52.0)
Hemoglobin: 13.2 g/dL (ref 13.0–17.0)
MCH: 28.1 pg (ref 26.0–34.0)
MCHC: 32.8 g/dL (ref 30.0–36.0)
MCV: 85.5 fL (ref 80.0–100.0)
Platelets: 230 10*3/uL (ref 150–400)
RBC: 4.7 MIL/uL (ref 4.22–5.81)
RDW: 15.5 % (ref 11.5–15.5)
WBC: 17.2 10*3/uL — ABNORMAL HIGH (ref 4.0–10.5)
nRBC: 0 % (ref 0.0–0.2)

## 2019-05-20 LAB — GLUCOSE, CAPILLARY
Glucose-Capillary: 183 mg/dL — ABNORMAL HIGH (ref 70–99)
Glucose-Capillary: 206 mg/dL — ABNORMAL HIGH (ref 70–99)
Glucose-Capillary: 183 mg/dL — ABNORMAL HIGH (ref 70–99)
Glucose-Capillary: 216 mg/dL — ABNORMAL HIGH (ref 70–99)
Glucose-Capillary: 201 mg/dL — ABNORMAL HIGH (ref 70–99)

## 2019-05-20 LAB — BASIC METABOLIC PANEL
Anion gap: 11 (ref 5–15)
BUN: 12 mg/dL (ref 6–20)
CO2: 21 mmol/L — ABNORMAL LOW (ref 22–32)
Calcium: 7.7 mg/dL — ABNORMAL LOW (ref 8.9–10.3)
Chloride: 102 mmol/L (ref 98–111)
Creatinine, Ser: 0.99 mg/dL (ref 0.61–1.24)
GFR calc Af Amer: >60 mL/min (ref >60)
GFR calc non Af Amer: >60 mL/min (ref >60)
Glucose, Bld: 195 mg/dL — ABNORMAL HIGH (ref 70–99)
Potassium: 4.3 mmol/L (ref 3.5–5.1)
Sodium: 134 mmol/L — ABNORMAL LOW (ref 135–145)

## 2019-05-20 MED ORDER — LISDEXAMFETAMINE DIMESYLATE 20 MG PO CAPS
20.0000 mg | ORAL_CAPSULE | Freq: Every day | ORAL | Status: DC
  Administered 2019-05-21 – 2019-05-22 (×2): 20 mg via ORAL
  Filled 2019-05-20 (×2): qty 1

## 2019-05-20 MED ORDER — SODIUM CHLORIDE 0.9% FLUSH
10.0000 mL | Freq: Two times a day (BID) | INTRAVENOUS | Status: DC
  Administered 2019-05-20 – 2019-05-22 (×3): 10 mL

## 2019-05-20 MED ORDER — INSULIN ASPART 100 UNIT/ML ~~LOC~~ SOLN
0.0000 [IU] | SUBCUTANEOUS | Status: DC
  Administered 2019-05-20 (×2): 3 [IU] via SUBCUTANEOUS
  Administered 2019-05-20 (×3): 5 [IU] via SUBCUTANEOUS
  Administered 2019-05-21: 08:00:00 3 [IU] via SUBCUTANEOUS
  Administered 2019-05-21: 5 [IU] via SUBCUTANEOUS
  Administered 2019-05-21 (×2): 3 [IU] via SUBCUTANEOUS

## 2019-05-20 MED ORDER — LURASIDONE HCL 40 MG PO TABS
80.0000 mg | ORAL_TABLET | Freq: Every day | ORAL | Status: DC
  Administered 2019-05-20 – 2019-05-22 (×3): 80 mg via ORAL
  Filled 2019-05-20 (×3): qty 2

## 2019-05-20 MED ORDER — LAMOTRIGINE 100 MG PO TABS
100.0000 mg | ORAL_TABLET | Freq: Every day | ORAL | Status: DC
  Administered 2019-05-20 – 2019-05-22 (×3): 100 mg via ORAL
  Filled 2019-05-20 (×3): qty 1

## 2019-05-20 MED ORDER — PANTOPRAZOLE SODIUM 40 MG PO TBEC
40.0000 mg | DELAYED_RELEASE_TABLET | Freq: Every day | ORAL | Status: DC
  Administered 2019-05-20 – 2019-05-22 (×3): 40 mg via ORAL
  Filled 2019-05-20 (×3): qty 1

## 2019-05-20 MED ORDER — ATORVASTATIN CALCIUM 40 MG PO TABS
40.0000 mg | ORAL_TABLET | Freq: Every day | ORAL | Status: DC
  Administered 2019-05-20 – 2019-05-22 (×3): 40 mg via ORAL
  Filled 2019-05-20 (×3): qty 1

## 2019-05-20 MED ORDER — SODIUM CHLORIDE 0.9% FLUSH
10.0000 mL | INTRAVENOUS | Status: DC | PRN
  Administered 2019-05-22: 10 mL
  Administered 2019-05-22: 30 mL
  Filled 2019-05-20 (×2): qty 40

## 2019-05-20 MED ORDER — TRAZODONE HCL 50 MG PO TABS
50.0000 mg | ORAL_TABLET | Freq: Every day | ORAL | Status: DC
  Administered 2019-05-20 – 2019-05-22 (×3): 50 mg via ORAL
  Filled 2019-05-20 (×3): qty 1

## 2019-05-20 MED ORDER — LISDEXAMFETAMINE DIMESYLATE 50 MG PO CAPS: 50.0000 mg | ORAL_CAPSULE | Freq: Every day | ORAL | Status: DC

## 2019-05-20 NOTE — Anesthesia Postprocedure Evaluation (Signed)
Anesthesia Post Note  Patient: [name]  Procedure(s) Performed: ILEOSTOMY REVERSAL WITH ILEOCOLIC AND COLORECTAL ANASTOMOSIS (N/A Abdomen)     Patient location during evaluation: PACU Anesthesia Type: General Level of consciousness: awake and alert Pain management: pain level controlled Vital Signs Assessment: post-procedure vital signs reviewed and stable Respiratory status: spontaneous breathing, nonlabored ventilation, respiratory function stable and patient connected to nasal cannula oxygen Cardiovascular status: blood pressure returned to baseline and stable Postop Assessment: no apparent nausea or vomiting Anesthetic complications: no    Last Vitals:  Vitals:   05/20/19 0040 05/20/19 0648  BP: (!) 155/97 (!) 177/105  Pulse: (!) 125 (!) 133  Resp: 18 18  Temp: 37.4 C 37.6 C  SpO2: 94% 92%    Last Pain:  Vitals:   05/20/19 0800  TempSrc:   PainSc: 3                  Marieliz Strang DAVID

## 2019-05-20 NOTE — Progress Notes (Signed)
  Progress Note: General Surgery Service   Assessment/Plan: Active Problems:   Ileostomy care [facility])  s/p Procedure(s): ILEOSTOMY REVERSAL WITH ILEOCOLIC AND COLORECTAL ANASTOMOSIS 05/19/2019 -home meds -continue liquids and protein shakes -ambulate   LOS: 1 day  Chief Complaint/Subjective: Moderate pain, ambulating well, no nausea, no real appetite, no flatus  Objective: Vital signs in last 24 hours: Temp:  [98.1 F (36.7 C)-99.9 F (37.7 C)] 99.4 F (37.4 C) (07/31 1300) Pulse Rate:  [104-134] 130 (07/31 1300) Resp:  [16-20] 18 (07/31 1300) BP: (130-177)/(86-111) 148/86 (07/31 1300) SpO2:  [92 %-100 %] 94 % (07/31 1300) Weight:  [110 kg] 110 kg (07/31 0648) Last BM Date: 05/19/19  Intake/Output from previous day: 07/30 0701 - 07/31 0700 In: 6385.1 [P.O.:480; I.V.:4745.6; IV Piggyback:1159.5] Out: 2240 [OZHYQ:6578; Drains:365; Blood:400] Intake/Output this shift: Total I/O In: 692.8 [P.O.:240; I.V.:452.8] Out: 345 [Urine:325; Drains:20]  Lungs: nonlabored  Cardiovascular: RRR  Abd: soft, ATTP, midline with purple vac, ostomy sites packed  Extremities: no edema  Neuro: AOx4  Lab Results: CBC  Recent Labs    05/20/19 0335  WBC 17.2*  HGB 13.2  HCT 40.2  PLT 230   BMET Recent Labs    05/20/19 0335  NA 134*  K 4.3  CL 102  CO2 21*  GLUCOSE 195*  BUN 12  CREATININE 0.99  CALCIUM 7.7*   PT/INR No results for input(s): LABPROT, INR in the last 72 hours. ABG No results for input(s): PHART, HCO3 in the last 72 hours.  Invalid input(s): PCO2, PO2  Studies/Results:  Anti-infectives: Anti-infectives (From admission, onward)   Start     Dose/Rate Route Frequency Ordered Stop   05/19/19 0600  clindamycin (CLEOCIN) IVPB 900 mg     900 mg 100 mL/hr over 30 Minutes Intravenous 60 min pre-op 05/18/19 0953 05/19/19 0800   05/19/19 0600  gentamicin (GARAMYCIN) 420 mg in dextrose 5 % 100 mL IVPB     5 mg/kg  83.7 kg (Adjusted) 110.5 mL/hr over 60  Minutes Intravenous 60 min pre-op 05/18/19 0953 05/19/19 0813      Medications: Scheduled Meds: . alvimopan  12 mg Oral BID  . atorvastatin  40 mg Oral q1800  . enoxaparin (LOVENOX) injection  40 mg Subcutaneous Q24H  . feeding supplement  237 mL Oral BID BM  . gabapentin  300 mg Oral BID  . insulin aspart  0-15 Units Subcutaneous Q4H  . ketorolac  30 mg Intravenous Q6H  . lamoTRIgine  100 mg Oral Daily  . [START ON 05/21/2019] lisdexamfetamine  20 mg Oral Q lunch  . [START ON 05/21/2019] lisdexamfetamine  50 mg Oral Q breakfast  . pantoprazole  40 mg Oral Daily  . sodium chloride flush  10-40 mL Intracatheter Q12H  . traZODone  50 mg Oral QHS   Continuous Infusions: . sodium chloride 100 mL/hr at 05/20/19 0903   PRN Meds:.acetaminophen, diphenhydrAMINE **OR** diphenhydrAMINE, metoprolol tartrate, morphine injection, ondansetron **OR** ondansetron (ZOFRAN) IV, oxyCODONE, sodium chloride flush  Mickeal Skinner, MD [facility]

## 2019-05-20 NOTE — Progress Notes (Addendum)
   05/19/19 2114  Vitals  Temp 99.9 F (37.7 C)  Temp Source Oral  BP (!) 155/91  MAP (mmHg) 111  BP Location Right Arm  BP Method Automatic  Patient Position (if appropriate) Lying  Pulse Rate (!) 133  Resp 20   MD on call notified of vitals, MEWS score of 3 due to elevated pulse. Bolus administered, patient stable and will continue to be monitored. Dawson Bills, RN

## 2019-05-20 NOTE — Progress Notes (Signed)
RN has asked pt several times to ambulate and has explained the benefits of walking to pt but pt continues to refuse saying he is in pain and will walk later.

## 2019-05-21 LAB — CBC
HCT: 33.6 % — ABNORMAL LOW (ref 39.0–52.0)
Hemoglobin: 10.9 g/dL — ABNORMAL LOW (ref 13.0–17.0)
MCH: 28.2 pg (ref 26.0–34.0)
MCHC: 32.4 g/dL (ref 30.0–36.0)
MCV: 87 fL (ref 80.0–100.0)
Platelets: 172 10*3/uL (ref 150–400)
RBC: 3.86 MIL/uL — ABNORMAL LOW (ref 4.22–5.81)
RDW: 15.7 % — ABNORMAL HIGH (ref 11.5–15.5)
WBC: 14.2 10*3/uL — ABNORMAL HIGH (ref 4.0–10.5)
nRBC: 0 % (ref 0.0–0.2)

## 2019-05-21 LAB — GLUCOSE, CAPILLARY
Glucose-Capillary: 182 mg/dL — ABNORMAL HIGH (ref 70–99)
Glucose-Capillary: 183 mg/dL — ABNORMAL HIGH (ref 70–99)
Glucose-Capillary: 203 mg/dL — ABNORMAL HIGH (ref 70–99)
Glucose-Capillary: 195 mg/dL — ABNORMAL HIGH (ref 70–99)

## 2019-05-21 MED ORDER — SODIUM CHLORIDE 0.9% FLUSH
3.0000 mL | INTRAVENOUS | Status: DC | PRN
  Administered 2019-05-22: 3 mL via INTRAVENOUS
  Filled 2019-05-21: qty 3

## 2019-05-21 MED ORDER — LISDEXAMFETAMINE DIMESYLATE 30 MG PO CAPS
50.0000 mg | ORAL_CAPSULE | Freq: Every day | ORAL | Status: DC
  Administered 2019-05-22 – 2019-05-23 (×2): 50 mg via ORAL
  Filled 2019-05-21 (×3): qty 1

## 2019-05-21 MED ORDER — SODIUM CHLORIDE 0.9% FLUSH
3.0000 mL | Freq: Two times a day (BID) | INTRAVENOUS | Status: DC
  Administered 2019-05-22 (×2): 3 mL via INTRAVENOUS

## 2019-05-21 MED ORDER — INSULIN ASPART 100 UNIT/ML ~~LOC~~ SOLN
0.0000 [IU] | Freq: Three times a day (TID) | SUBCUTANEOUS | Status: DC
  Administered 2019-05-21: 3 [IU] via SUBCUTANEOUS
  Administered 2019-05-22: 5 [IU] via SUBCUTANEOUS
  Administered 2019-05-22 (×2): 3 [IU] via SUBCUTANEOUS
  Administered 2019-05-22: 2 [IU] via SUBCUTANEOUS
  Administered 2019-05-23: 3 [IU] via SUBCUTANEOUS

## 2019-05-21 MED ORDER — SODIUM CHLORIDE 0.9 % IV SOLN
250.0000 mL | INTRAVENOUS | Status: DC | PRN
  Administered 2019-05-21: 250 mL via INTRAVENOUS

## 2019-05-21 NOTE — Progress Notes (Signed)
  Progress Note: General Surgery Service   Assessment/Plan: Active Problems:   Ileostomy care [facility])  s/p Procedure(s): ILEOSTOMY REVERSAL WITH ILEOCOLIC AND COLORECTAL ANASTOMOSIS 05/19/2019 -+flatus -tolerating liquids -advance diet -watch further due to tachycardia, WBC downtrending -stop IV fluids   LOS: 2 days  Chief Complaint/Subjective: Pain improving, ambulating well, tolerating liquids, appetite back, +flatus  Objective: Vital signs in last 24 hours: Temp:  [98.5 F (36.9 C)-99.4 F (37.4 C)] 99.1 F (37.3 C) (08/01 0533) Pulse Rate:  [126-136] 127 (08/01 0533) Resp:  [18-19] 19 (07/31 2034) BP: (102-148)/(82-95) 135/95 (08/01 0533) SpO2:  [91 %-94 %] 91 % (08/01 0533) Last BM Date: 05/19/19  Intake/Output from previous day: 07/31 0701 - 08/01 0700 In: 1689.4 [P.O.:840; I.V.:849.4] Out: 1235 [Urine:1175; Drains:60] Intake/Output this shift: No intake/output data recorded.  Lungs: nonlabored3  Cardiovascular: tachycardic  Abd: soft, ATTP, right side wounds packed  Extremities: no edema  Neuro: AOx4  Lab Results: CBC  Recent Labs    05/20/19 0335 05/21/19 0413  WBC 17.2* 14.2*  HGB 13.2 10.9*  HCT 40.2 33.6*  PLT 230 172   BMET Recent Labs    05/20/19 0335  NA 134*  K 4.3  CL 102  CO2 21*  GLUCOSE 195*  BUN 12  CREATININE 0.99  CALCIUM 7.7*   PT/INR No results for input(s): LABPROT, INR in the last 72 hours. ABG No results for input(s): PHART, HCO3 in the last 72 hours.  Invalid input(s): PCO2, PO2  Studies/Results:  Anti-infectives: Anti-infectives (From admission, onward)   Start     Dose/Rate Route Frequency Ordered Stop   05/19/19 0600  clindamycin (CLEOCIN) IVPB 900 mg     900 mg 100 mL/hr over 30 Minutes Intravenous 60 min pre-op 05/18/19 0953 05/19/19 0800   05/19/19 0600  gentamicin (GARAMYCIN) 420 mg in dextrose 5 % 100 mL IVPB     5 mg/kg  83.7 kg (Adjusted) 110.5 mL/hr over 60 Minutes Intravenous 60 min pre-op  05/18/19 0953 05/19/19 0813      Medications: Scheduled Meds: . alvimopan  12 mg Oral BID  . atorvastatin  40 mg Oral q1800  . enoxaparin (LOVENOX) injection  40 mg Subcutaneous Q24H  . feeding supplement  237 mL Oral BID BM  . gabapentin  300 mg Oral BID  . insulin aspart  0-15 Units Subcutaneous Q4H  . ketorolac  30 mg Intravenous Q6H  . lamoTRIgine  100 mg Oral Daily  . lisdexamfetamine  20 mg Oral Q lunch  . lisdexamfetamine  50 mg Oral Q breakfast  . lurasidone  80 mg Oral QHS  . pantoprazole  40 mg Oral Daily  . sodium chloride flush  10-40 mL Intracatheter Q12H  . sodium chloride flush  3 mL Intravenous Q12H  . traZODone  50 mg Oral QHS   Continuous Infusions: . sodium chloride     PRN Meds:.sodium chloride, acetaminophen, diphenhydrAMINE **OR** diphenhydrAMINE, metoprolol tartrate, morphine injection, ondansetron **OR** ondansetron (ZOFRAN) IV, oxyCODONE, sodium chloride flush, sodium chloride flush  Mickeal Skinner, MD [facility]

## 2019-05-22 LAB — BASIC METABOLIC PANEL
Anion gap: 11 (ref 5–15)
BUN: 16 mg/dL (ref 6–20)
CO2: 28 mmol/L (ref 22–32)
Calcium: 8.7 mg/dL — ABNORMAL LOW (ref 8.9–10.3)
Chloride: 96 mmol/L — ABNORMAL LOW (ref 98–111)
Creatinine, Ser: 1.07 mg/dL (ref 0.61–1.24)
GFR calc Af Amer: >60 mL/min (ref >60)
GFR calc non Af Amer: >60 mL/min (ref >60)
Glucose, Bld: 230 mg/dL — ABNORMAL HIGH (ref 70–99)
Potassium: 3.4 mmol/L — ABNORMAL LOW (ref 3.5–5.1)
Sodium: 135 mmol/L (ref 135–145)

## 2019-05-22 LAB — GLUCOSE, CAPILLARY
Glucose-Capillary: 149 mg/dL — ABNORMAL HIGH (ref 70–99)
Glucose-Capillary: 225 mg/dL — ABNORMAL HIGH (ref 70–99)
Glucose-Capillary: 164 mg/dL — ABNORMAL HIGH (ref 70–99)
Glucose-Capillary: 158 mg/dL — ABNORMAL HIGH (ref 70–99)

## 2019-05-22 LAB — CBC
HCT: 32.8 % — ABNORMAL LOW (ref 39.0–52.0)
Hemoglobin: 10.8 g/dL — ABNORMAL LOW (ref 13.0–17.0)
MCH: 28.7 pg (ref 26.0–34.0)
MCHC: 32.9 g/dL (ref 30.0–36.0)
MCV: 87.2 fL (ref 80.0–100.0)
Platelets: 163 10*3/uL (ref 150–400)
RBC: 3.76 MIL/uL — ABNORMAL LOW (ref 4.22–5.81)
RDW: 15.2 % (ref 11.5–15.5)
WBC: 10.3 10*3/uL (ref 4.0–10.5)
nRBC: 0 % (ref 0.0–0.2)

## 2019-05-22 NOTE — Progress Notes (Signed)
Pharmacy Brief Note - Alvimopan (Entereg)  The standing order set for alvimopan (Entereg) now includes an automatic order to discontinue the drug after the patient has had a bowel movement. The change was approved by the Pharmacy & Therapeutics Committee and the Medical Executive Committee.  This patient has had a bowel movement documented by nursing. Therefore, alvimopan has been discontinued. If there are questions, please contact the pharmacy at [phone].  Thank you    

## 2019-05-22 NOTE — Progress Notes (Signed)
05/21/2019 2200 CBG: 170, CBG did not cross over to results after docking monitor.   Norlene Duel RN, BSN

## 2019-05-22 NOTE — Progress Notes (Signed)
  Progress Note: General Surgery Service   Assessment/Plan: Active Problems:   Ileostomy care [facility])  s/p Procedure(s): ILEOSTOMY REVERSAL WITH ILEOCOLIC AND COLORECTAL ANASTOMOSIS 05/19/2019 -+BM -tachycardia improved -f/u labs   LOS: 3 days  Chief Complaint/Subjective: Tolerating diet, +BM, +flatus, ambulating well, pain improving  Objective: Vital signs in last 24 hours: Temp:  [98.4 F (36.9 C)-100 F (37.8 C)] 98.4 F (36.9 C) (08/02 0546) Pulse Rate:  [104-117] 104 (08/02 0546) Resp:  [20] 20 (08/02 0546) BP: (114-141)/(79-88) 141/88 (08/02 0546) SpO2:  [95 %-96 %] 95 % (08/02 0546) Last BM Date: 05/21/19  Intake/Output from previous day: 08/01 0701 - 08/02 0700 In: 255.8 [P.O.:120; I.V.:135.8] Out: 30 [Drains:30] Intake/Output this shift: No intake/output data recorded.  Lungs: nonlabored  Cardiovascular: tachycardic  Abd: soft, ATTP, right wound open and packed, no erythema  Extremities: no edema  Neuro: AOx4  Lab Results: CBC  Recent Labs    05/20/19 0335 05/21/19 0413  WBC 17.2* 14.2*  HGB 13.2 10.9*  HCT 40.2 33.6*  PLT 230 172   BMET Recent Labs    05/20/19 0335  NA 134*  K 4.3  CL 102  CO2 21*  GLUCOSE 195*  BUN 12  CREATININE 0.99  CALCIUM 7.7*   PT/INR No results for input(s): LABPROT, INR in the last 72 hours. ABG No results for input(s): PHART, HCO3 in the last 72 hours.  Invalid input(s): PCO2, PO2  Studies/Results:  Anti-infectives: Anti-infectives (From admission, onward)   Start     Dose/Rate Route Frequency Ordered Stop   05/19/19 0600  clindamycin (CLEOCIN) IVPB 900 mg     900 mg 100 mL/hr over 30 Minutes Intravenous 60 min pre-op 05/18/19 0953 05/19/19 0800   05/19/19 0600  gentamicin (GARAMYCIN) 420 mg in dextrose 5 % 100 mL IVPB     5 mg/kg  83.7 kg (Adjusted) 110.5 mL/hr over 60 Minutes Intravenous 60 min pre-op 05/18/19 0953 05/19/19 0813      Medications: Scheduled Meds: . alvimopan  12 mg Oral  BID  . atorvastatin  40 mg Oral q1800  . enoxaparin (LOVENOX) injection  40 mg Subcutaneous Q24H  . feeding supplement  237 mL Oral BID BM  . gabapentin  300 mg Oral BID  . insulin aspart  0-15 Units Subcutaneous TID AC & HS  . ketorolac  30 mg Intravenous Q6H  . lamoTRIgine  100 mg Oral Daily  . lisdexamfetamine  20 mg Oral Q lunch  . lisdexamfetamine  50 mg Oral Q breakfast  . lurasidone  80 mg Oral QHS  . pantoprazole  40 mg Oral Daily  . sodium chloride flush  10-40 mL Intracatheter Q12H  . sodium chloride flush  3 mL Intravenous Q12H  . traZODone  50 mg Oral QHS   Continuous Infusions: . sodium chloride 250 mL (05/21/19 1922)   PRN Meds:.sodium chloride, acetaminophen, diphenhydrAMINE **OR** diphenhydrAMINE, metoprolol tartrate, morphine injection, ondansetron **OR** ondansetron (ZOFRAN) IV, oxyCODONE, sodium chloride flush, sodium chloride flush  Mickeal Skinner, MD [facility]

## 2019-05-23 ENCOUNTER — Encounter (HOSPITAL_COMMUNITY): Payer: Self-pay | Admitting: General Surgery

## 2019-05-23 LAB — GLUCOSE, CAPILLARY
Glucose-Capillary: 170 mg/dL — ABNORMAL HIGH (ref 70–99)
Glucose-Capillary: 170 mg/dL — ABNORMAL HIGH (ref 70–99)

## 2019-05-23 MED ORDER — OXYCODONE HCL 5 MG PO TABS: 5.0000 mg | ORAL_TABLET | Freq: Four times a day (QID) | ORAL | 0 refills | Status: DC | PRN

## 2019-05-23 MED ORDER — IBUPROFEN 800 MG PO TABS: 800.0000 mg | ORAL_TABLET | Freq: Three times a day (TID) | ORAL | 0 refills | Status: DC | PRN

## 2019-05-23 NOTE — Progress Notes (Signed)
Discharge instructions given to pt and all questions were answered.  

## 2019-05-23 NOTE — Discharge Summary (Signed)
Physician Discharge Summary  Patient ID: [name] MRN: [id number] DOB/AGE: 1981/07/04 38 y.o.  Admit date: 05/19/2019 Discharge date: 05/23/2019  Admission Diagnoses:  Discharge Diagnoses:  Active Problems:   Ileostomy care [facility])   Discharged Condition: good  Hospital Course: 38 yo male with history of diverticular stricture who underwent cecectomy and sigmoid resection with colostomy/ileostomy. He presented for reversal. Post op he was admitted to surgical floor. He has some tachycardia and pain that improved over the next 4 days. He was able to tolerate a diet and had multiple bowel movements. He was discharged home POD 4.  Consults: None  Significant Diagnostic Studies:  CBC    Component Value Date/Time   WBC 10.3 05/22/2019 1218   RBC 3.76 (L) 05/22/2019 1218   HGB 10.8 (L) 05/22/2019 1218   HGB 14.9 10/04/2018 1520   HCT 32.8 (L) 05/22/2019 1218   HCT 43.0 10/04/2018 1520   PLT 163 05/22/2019 1218   PLT 241 10/04/2018 1520   MCV 87.2 05/22/2019 1218   MCV 85 10/04/2018 1520   MCH 28.7 05/22/2019 1218   MCHC 32.9 05/22/2019 1218   RDW 15.2 05/22/2019 1218   RDW 13.2 10/04/2018 1520   LYMPHSABS 2.8 05/17/2019 1222   LYMPHSABS 2.8 10/04/2018 1520   MONOABS 0.6 05/17/2019 1222   EOSABS 0.4 05/17/2019 1222   EOSABS 0.3 10/04/2018 1520   BASOSABS 0.1 05/17/2019 1222   BASOSABS 0.1 10/04/2018 1520     Treatments: surgery: ileostomy reversal with ileocolic anastomosis and colorectal anastomosis  Discharge Exam: Blood pressure 139/87, pulse 97, temperature 97.9 F (36.6 C), temperature source Oral, resp. rate 20, weight 110.6 kg, SpO2 98 %. General appearance: alert and cooperative Head: Normocephalic, without obvious abnormality, atraumatic Neck: no adenopathy, no carotid bruit, no JVD, supple, symmetrical, trachea midline and thyroid not enlarged, symmetric, no tenderness/mass/nodules Cardio: regular rate and rhythm, S1, S2 normal, no murmur, click, rub or  gallop GI: midline incision c/d/i, right side wounds with clean base  Disposition: Discharge disposition: 01-Home or Self Care       Discharge Instructions    Call MD for:  persistant nausea and vomiting   Complete by: As directed    Call MD for:  redness, tenderness, or signs of infection (pain, swelling, redness, odor or green/yellow discharge around incision site)   Complete by: As directed    Call MD for:  severe uncontrolled pain   Complete by: As directed    Call MD for:  temperature >100.4   Complete by: As directed    Diet - low sodium heart healthy   Complete by: As directed    Discharge wound care:   Complete by: As directed    Pack right side wounds with saline moistened gauze 2 times daily   Increase activity slowly   Complete by: As directed      Allergies as of 05/23/2019      Reactions   Penicillins Anaphylaxis, Shortness Of Breath, Swelling   Did it involve swelling of the face/tongue/throat, SOB, or low BP? Yes Did it involve sudden or severe rash/hives, skin peeling, or any reaction on the inside of your mouth or nose? Unk Did you need to seek medical attention at a hospital or doctor's office? No When did it last happen?Unk If all above answers are "NO", may proceed with cephalosporin use. Got ceftriaxone before   Amoxicillin Hives   Per patient's mother   Metformin And Related Other (See Comments)   Stomach cramps  Medication List    TAKE these medications   Accu-Chek FastClix Lancets Misc Check blood sugar once daily   Accu-Chek Nano SmartView w/Device Kit Check blood sugar once daily   atorvastatin 40 MG tablet Commonly known as: LIPITOR Take 1 tablet (40 mg total) by mouth daily. What changed: when to take this   diphenhydramine-acetaminophen 25-500 MG Tabs tablet Commonly known as: TYLENOL PM Take 3 tablets by mouth at bedtime.   gabapentin 300 MG capsule Commonly known as: NEURONTIN Take 300 mg by mouth daily.   glucose  blood test strip Commonly known as: Accu-Chek SmartView Check blood sugar once daily   ibuprofen 800 MG tablet Commonly known as: ADVIL Take 1 tablet (800 mg total) by mouth every 8 (eight) hours as needed.   lamoTRIgine 25 MG tablet Commonly known as: LaMICtal Take 1 tab daily for 1 week and than 2 tab daily   Latuda 80 MG Tabs tablet Generic drug: lurasidone Take 80 mg by mouth at bedtime.   omeprazole 40 MG capsule Commonly known as: PRILOSEC Take 1 capsule (40 mg total) by mouth at bedtime.   oxyCODONE 5 MG immediate release tablet Commonly known as: Oxy IR/ROXICODONE Take 1 tablet (5 mg total) by mouth every 6 (six) hours as needed for severe pain.   ProAir HFA 108 (90 Base) MCG/ACT inhaler Generic drug: albuterol INHALE 2 PUFFS BY MOUTH EVERY 6 HOURS AS NEEDED FOR WHEEZE OR SHORTNESS OF BREATH What changed: See the new instructions.   traZODone 50 MG tablet Commonly known as: DESYREL Take 50 mg by mouth at bedtime.   Vyvanse 50 MG capsule Generic drug: lisdexamfetamine Take 50 mg by mouth daily with breakfast.   Vyvanse 20 MG capsule Generic drug: lisdexamfetamine Take 20 mg by mouth daily with lunch.            Discharge Care Instructions  (From admission, onward)         Start     Ordered   05/23/19 0000  Discharge wound care:    Comments: Pack right side wounds with saline moistened gauze 2 times daily   05/23/19 7322           Signed: Arta Bruce Navaya Wiatrek 05/23/2019, 7:55 AM

## 2019-05-27 DIAGNOSIS — K631 Perforation of intestine (nontraumatic): Secondary | ICD-10-CM | POA: Diagnosis not present

## 2019-05-27 DIAGNOSIS — Z933 Colostomy status: Secondary | ICD-10-CM | POA: Diagnosis not present

## 2019-06-09 ENCOUNTER — Other Ambulatory Visit: Payer: Self-pay

## 2019-06-09 ENCOUNTER — Ambulatory Visit (INDEPENDENT_AMBULATORY_CARE_PROVIDER_SITE_OTHER): Payer: Medicare Other | Admitting: Student in an Organized Health Care Education/Training Program

## 2019-06-09 ENCOUNTER — Encounter: Payer: Self-pay | Admitting: Student in an Organized Health Care Education/Training Program

## 2019-06-09 DIAGNOSIS — G8918 Other acute postprocedural pain: Secondary | ICD-10-CM | POA: Diagnosis not present

## 2019-06-09 DIAGNOSIS — K219 Gastro-esophageal reflux disease without esophagitis: Secondary | ICD-10-CM

## 2019-06-09 MED ORDER — OMEPRAZOLE 40 MG PO CPDR: 40.0000 mg | DELAYED_RELEASE_CAPSULE | Freq: Every day | ORAL | 1 refills | Status: DC

## 2019-06-09 MED ORDER — ACETAMINOPHEN-CODEINE #3 300-30 MG PO TABS: 1.0000 | ORAL_TABLET | Freq: Four times a day (QID) | ORAL | 0 refills | Status: DC | PRN

## 2019-06-09 NOTE — Assessment & Plan Note (Signed)
Improved, but not resolved.  Prefer tylenol over ibuprofen. Discussed with patient.  Prescribe T3 for time period until his next follow up with surgery. Patient is agreeable to this plan.

## 2019-06-09 NOTE — Patient Instructions (Signed)
It was a pleasure to see you today!  To summarize our discussion for this visit:  I am prescribing you tylenol 3 for the interim until your follow up appointment with your surgeon  I am also refilling your prilosec  We have your pharmacy in the chart  Some additional health maintenance measures we should update are: Health Maintenance Due  Topic Date Due  . OPHTHALMOLOGY EXAM  06/18/1991  . INFLUENZA VACCINE  05/21/2019  .    Call the clinic at [phone] if your symptoms worsen or you have any concerns.   Thank you for allowing me to take part in your care,  Dr. Doristine Mango

## 2019-06-09 NOTE — Progress Notes (Signed)
   Subjective:    Patient ID: [name], male    DOB: 1981/06/21, 38 y.o.   MRN: [id number]   CC: Hospital follow-up, ostomy reversal  HPI:  Patient states that he is feeling well overall since his surgery.  He continues to have mild abdominal pain that he describes as throbbing and has been improving every day since the surgery.  He is having bowel movements daily which are described as loose.  He has mild amount of blood in his stool which he states his surgeon said would be normal.  His surgeon had prescribed him ibuprofen and T3 for pain and told him he could go back to work on Monday.  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  He continues to have pain while working and it was well treated with T3.  He has another follow-up appointment with his surgeon in about 2-1/2 half weeks.  He has been able to eat normally not had any nausea or vomiting.  He has not had any fever or noticed any purulent drainage from his surgical site.   Patient would like refill of his Prilosec today.  Smoking status reviewed   ROS: pertinent noted in the HPI   Past medical history, surgical, family, and social history reviewed and updated in the EMR as appropriate.  Objective:  BP 120/72   Pulse 97   SpO2 98%   Vitals and nursing note reviewed  General: NAD, pleasant, able to participate in exam Abdomen: patient has 3 surgical incisions on abdomen without complete closure of the epidermis. There is no drainage from the sites and no erythema or increased warmth to the sites. Mildly tender to palpation in general area of incisions.  Extremities: no edema or cyanosis. Skin: warm and dry, no rashes noted Neuro: alert, no obvious focal deficits Psych: Normal affect and mood   Assessment & Plan:    Postoperative pain Improved, but not resolved.  Prefer tylenol over ibuprofen. Discussed with patient.  Prescribe T3 for time period until his next follow up with surgery.  Patient is agreeable to this plan.    Doristine Mango, [facility] PGY-2

## 2019-07-12 ENCOUNTER — Ambulatory Visit (INDEPENDENT_AMBULATORY_CARE_PROVIDER_SITE_OTHER): Payer: Medicare Other | Admitting: *Deleted

## 2019-07-12 ENCOUNTER — Other Ambulatory Visit: Payer: Self-pay

## 2019-07-12 DIAGNOSIS — Z23 Encounter for immunization: Secondary | ICD-10-CM

## 2019-08-05 ENCOUNTER — Other Ambulatory Visit: Payer: Self-pay

## 2019-08-05 NOTE — Patient Outreach (Signed)
[facility]) Care Management  08/05/2019  Maurice 1981-04-17 [id number]   Medication Adherence call to Mr. Paulo Keimig HIPPA Compliant Voice message left with a call back number. Mr. Sitzer is showing past due on Atorvastatin 40 mg under [place].   [facility] Management Direct Dial [phone]  Fax [phone] [email or web address]

## 2019-08-28 ENCOUNTER — Other Ambulatory Visit: Payer: Self-pay | Admitting: Pharmacist

## 2019-08-28 DIAGNOSIS — E785 Hyperlipidemia, unspecified: Secondary | ICD-10-CM

## 2019-08-29 MED ORDER — ATORVASTATIN CALCIUM 40 MG PO TABS: 40.0000 mg | ORAL_TABLET | Freq: Every day | ORAL | 1 refills | Status: DC

## 2019-09-20 ENCOUNTER — Other Ambulatory Visit: Payer: Self-pay

## 2019-09-20 DIAGNOSIS — Z20822 Contact with and (suspected) exposure to covid-19: Secondary | ICD-10-CM

## 2019-09-23 LAB — NOVEL CORONAVIRUS, NAA: SARS-CoV-2, NAA: NOT DETECTED

## 2019-10-06 ENCOUNTER — Other Ambulatory Visit: Payer: Self-pay

## 2019-10-06 ENCOUNTER — Ambulatory Visit (INDEPENDENT_AMBULATORY_CARE_PROVIDER_SITE_OTHER): Payer: Medicare Other | Admitting: Family Medicine

## 2019-10-06 VITALS — BP 118/72 | HR 115 | Wt 266.4 lb

## 2019-10-06 DIAGNOSIS — M549 Dorsalgia, unspecified: Secondary | ICD-10-CM | POA: Diagnosis not present

## 2019-10-06 DIAGNOSIS — G8918 Other acute postprocedural pain: Secondary | ICD-10-CM

## 2019-10-06 MED ORDER — TIZANIDINE HCL 4 MG PO TABS: 4.0000 mg | ORAL_TABLET | Freq: Four times a day (QID) | ORAL | 0 refills | Status: DC | PRN

## 2019-10-06 MED ORDER — DICYCLOMINE HCL 10 MG PO CAPS: 10.0000 mg | ORAL_CAPSULE | Freq: Three times a day (TID) | ORAL | 0 refills | Status: DC

## 2019-10-06 NOTE — Assessment & Plan Note (Signed)
Appears to be musculoskeletal given tenderness to palpation and tenderness to lateral extension of the cervical spine without bony tenderness.  Gave patient a handout on back exercises and stretches and encouraged him to do these daily.  Also gave him a prescription for tizanidine for acute pain.  Counseled on the use of up to 4 g of Tylenol per day for pain relief with very occasional NSAIDs on a full stomach if absolutely needed.  Told him that I would not be prescribing narcotics or Tylenol 3 for his pain because these are not helpful in the long run and are habit-formin, but I would send a referral to pain management on his request.

## 2019-10-06 NOTE — Progress Notes (Signed)
Subjective:    [name] - 38 y.o. male MRN [id number]  Date of birth: Mar 04, 1981  CC:  [name] is here for back pain.  HPI: Back Pain Back pain is chronic but began worening a few weeks ago. Pain is described as stabbing, sharp pain. Patient has tried ibuprofen, and he was on Tylenol #3 as needed after his recent surgery, which worked better for him. Pain radiates down left arm to L hand. History of trauma or injury: no Patient believes might be causing their pain: possibly related to his colon issues.  His pain is now his upper back rather than his lower back, and since his colon procedure, his lower back pain is similar.  No new heavy lifting or other activities.  Prior history of similar pain: abdominal pain after colon anastomosis History of cancer: no Weak immune system:  no History of IV drug use: no History of steroid use: no  Symptoms Incontinence of bowel or bladder:  Diarrhea due to recent colon surgery Numbness of leg: no Fever: no Rest or Night pain: abdominal pain but no back pain Weight Loss: some weight loss since his surgery due to lower appetite Rash: no  Abdominal pain Patient has had a colectomy with reversal in July.  Healed well from this does have generalized abdominal.  He is able to eat normally and has not had any nausea, although he does have diarrhea, which has been stable since his surgery.   Patient wishes for "something stronger" for both his back pain and abdominal pain.  ROS see HPI Smoking Status noted.  Health Maintenance:  Health Maintenance Due  Topic Date Due  . OPHTHALMOLOGY EXAM  06/18/1991  . FOOT EXAM  09/11/2019  . URINE MICROALBUMIN  09/11/2019    -  reports that he has been smoking cigarettes. He has a 42.00 pack-year smoking history. He has never used smokeless tobacco. - Review of Systems: Per HPI. - Past Medical History: Patient Active Problem List   Diagnosis Date Noted  . Postoperative pain 06/09/2019  .  Ileostomy care (HCC) 05/19/2019  . Hypoxemia 11/30/2018  . Leukocytosis   . Hypokalemia   . Other chest pain 11/22/2018  . Abdominal pain 11/22/2018  . Memory difficulty 10/04/2018  . Erectile dysfunction 01/29/2018  . Neck pain 11/12/2016  . Back pain 10/25/2016  . Sleep difficulties 10/25/2016  . Allergic rhinitis 04/28/2016  . Dizzy 12/07/2015  . Type 2 diabetes mellitus without complication (HCC) 03/21/2015  . GERD (gastroesophageal reflux disease) 03/21/2013  . Headache 01/31/2013  . Gait disorder 01/31/2013  . Elevated BP 04/25/2012  . Narcotic abuse (HCC) 06/05/2011  . OBESITY 10/17/2010  . 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 SLEEP APNEA 01/24/2009  . BIPOLAR DISORDER UNSPECIFIED 05/05/2008  . HYPERLIPIDEMIA 03/30/2008  . ABUSE, ALCOHOL, UNSPECIFIED 04/26/2007  . TOBACCO ABUSE 12/25/2006   - Medications: reviewed and updated   Objective:   Physical Exam BP 118/72   Pulse (!) 115   Wt 266 lb 6.4 oz (120.8 kg)   SpO2 98%   BMI 41.72 kg/m  Gen: NAD, alert, cooperative with exam, obese Abd: well healing scars, BS present, mildly tender to palpation in all quadrants Back: No visual abnormality, mildly tender to palpation along the left side of the trapezius, normal ROM of cervical spine except on extension, some pain on extension to the left Psych: good insight, alert and oriented        Assessment & Plan:   Back pain Appears  to be musculoskeletal given tenderness to palpation and tenderness to lateral extension of the cervical spine without bony tenderness.  Gave patient a handout on back exercises and stretches and encouraged him to do these daily.  Also gave him a prescription for tizanidine for acute pain.  Counseled on the use of up to 4 g of Tylenol per day for pain relief with very occasional NSAIDs on a full stomach if absolutely needed.  Told him that I would not be prescribing narcotics or Tylenol 3 for his pain because these are not helpful in the long run  and are habit-formin, but I would send a referral to pain management on his request.  Postoperative pain Pain appears to be chronic since his surgery rather than an acute exacerbation.  I am reassured that he is eating like normal and does not have nausea.  Prescribed Bentyl for pain relief and reassured patient that this should resolve with time.  Gave patient return precautions    Maia Breslow, M.D. 10/06/2019, 11:06 AM PGY-3, [place]

## 2019-10-06 NOTE — Patient Instructions (Addendum)
It was nice meeting you today [name]!  For your upper back pain, I am giving you some exercises and stretches to follow.  This is likely the most important thing you can do.  It is also important to stay active in general since this has been shown to help back pain resolve faster.  You can take up to 4 g of Tylenol per day, which comes to 1000 mg every 6 hours if needed.  You can also use ibuprofen or Aleve very sparingly and on a full stomach, preferably less than a few times per week.  I am sending a referral to pain management for you today.  I am also sending in a muscle relaxer called to tizanidine that you can take before bedtime since this will often make you feel sleepy.  This is a short-term medication as well.  Your abdominal pain is likely due to all the changes that have occurred there in the last year.  I am sending an antispasmodic called Bentyl that you can take as needed.  This pain will likely slowly get better as time goes on.  If you have any questions or concerns, please feel free to call the clinic.   Be well,  Dr. Shan Levans  Acute Back Pain, Adult Acute back pain is sudden and usually short-lived. It is often caused by an injury to the muscles and tissues in the back. The injury may result from:  A muscle or ligament getting overstretched or torn (strained). Ligaments are tissues that connect bones to each other. Lifting something improperly can cause a back strain.  Wear and tear (degeneration) of the spinal disks. Spinal disks are circular tissue that provides cushioning between the bones of the spine (vertebrae).  Twisting motions, such as while playing sports or doing yard work.  A hit to the back.  Arthritis. You may have a physical exam, lab tests, and imaging tests to find the cause of your pain. Acute back pain usually goes away with rest and home care. Follow these instructions at home: Managing pain, stiffness, and swelling  Take over-the-counter and  prescription medicines only as told by your health care provider.  Your health care provider may recommend applying ice during the first 24-48 hours after your pain starts. To do this: ? Put ice in a plastic bag. ? Place a towel between your skin and the bag. ? Leave the ice on for 20 minutes, 2-3 times a day.  If directed, apply heat to the affected area as often as told by your health care provider. Use the heat source that your health care provider recommends, such as a moist heat pack or a heating pad. ? Place a towel between your skin and the heat source. ? Leave the heat on for 20-30 minutes. ? Remove the heat if your skin turns bright red. This is especially important if you are unable to feel pain, heat, or cold. You have a greater risk of getting burned. Activity   Do not stay in bed. Staying in bed for more than 1-2 days can delay your recovery.  Sit up and stand up straight. Avoid leaning forward when you sit, or hunching over when you stand. ? If you work at a desk, sit close to it so you do not need to lean over. Keep your chin tucked in. Keep your neck drawn back, and keep your elbows bent at a right angle. Your arms should look like the letter "L." ? Sit high and  close to the steering wheel when you drive. Add lower back (lumbar) support to your car seat, if needed.  Take short walks on even surfaces as soon as you are able. Try to increase the length of time you walk each day.  Do not sit, drive, or stand in one place for more than 30 minutes at a time. Sitting or standing for long periods of time can put stress on your back.  Do not drive or use heavy machinery while taking prescription pain medicine.  Use proper lifting techniques. When you bend and lift, use positions that put less stress on your back: ? [place] your knees. ? Keep the load close to your body. ? Avoid twisting.  Exercise regularly as told by your health care provider. Exercising helps your back heal  faster and helps prevent back injuries by keeping muscles strong and flexible.  Work with a physical therapist to make a safe exercise program, as recommended by your health care provider. Do any exercises as told by your physical therapist. Lifestyle  Maintain a healthy weight. Extra weight puts stress on your back and makes it difficult to have good posture.  Avoid activities or situations that make you feel anxious or stressed. Stress and anxiety increase muscle tension and can make back pain worse. Learn ways to manage anxiety and stress, such as through exercise. General instructions  Sleep on a firm mattress in a comfortable position. Try lying on your side with your knees slightly bent. If you lie on your back, put a pillow under your knees.  Follow your treatment plan as told by your health care provider. This may include: ? Cognitive or behavioral therapy. ? Acupuncture or massage therapy. ? Meditation or yoga. Contact a health care provider if:  You have pain that is not relieved with rest or medicine.  You have increasing pain going down into your legs or buttocks.  Your pain does not improve after 2 weeks.  You have pain at night.  You lose weight without trying.  You have a fever or chills. Get help right away if:  You develop new bowel or bladder control problems.  You have unusual weakness or numbness in your arms or legs.  You develop nausea or vomiting.  You develop abdominal pain.  You feel faint. Summary  Acute back pain is sudden and usually short-lived.  Use proper lifting techniques. When you bend and lift, use positions that put less stress on your back.  Take over-the-counter and prescription medicines and apply heat or ice as directed by your health care provider. This information is not intended to replace advice given to you by your health care provider. Make sure you discuss any questions you have with your health care provider. Document  Released: 10/06/2005 Document Revised: 01/25/2019 Document Reviewed: 05/20/2017 Elsevier Patient Education  2020 [employer].  Back Exercises These exercises help to make your trunk and back strong. They also help to keep the lower back flexible. Doing these exercises can help to prevent back pain or lessen existing pain.  If you have back pain, try to do these exercises 2-3 times each day or as told by your doctor.  As you get better, do the exercises once each day. Repeat the exercises more often as told by your doctor.  To stop back pain from coming back, do the exercises once each day, or as told by your doctor. Exercises Single knee to chest Do these steps 3-5 times in a row for  each leg: 1. Lie on your back on a firm bed or the floor with your legs stretched out. 2. Bring one knee to your chest. 3. Grab your knee or thigh with both hands and hold them it in place. 4. Pull on your knee until you feel a gentle stretch in your lower back or buttocks. 5. Keep doing the stretch for 10-30 seconds. 6. Slowly let go of your leg and straighten it. Pelvic tilt Do these steps 5-10 times in a row: 1. Lie on your back on a firm bed or the floor with your legs stretched out. 2. Bend your knees so they point up to the ceiling. Your feet should be flat on the floor. 3. Tighten your lower belly (abdomen) muscles to press your lower back against the floor. This will make your tailbone point up to the ceiling instead of pointing down to your feet or the floor. 4. Stay in this position for 5-10 seconds while you gently tighten your muscles and breathe evenly. Cat-cow Do these steps until your lower back bends more easily: 1. Get on your hands and knees on a firm surface. Keep your hands under your shoulders, and keep your knees under your hips. You may put padding under your knees. 2. Let your head hang down toward your chest. Tighten (contract) the muscles in your belly. Point your tailbone toward  the floor so your lower back becomes rounded like the back of a cat. 3. Stay in this position for 5 seconds. 4. Slowly lift your head. Let the muscles of your belly relax. Point your tailbone up toward the ceiling so your back forms a sagging arch like the back of a cow. 5. Stay in this position for 5 seconds.  Press-ups Do these steps 5-10 times in a row: 1. Lie on your belly (face-down) on the floor. 2. Place your hands near your head, about shoulder-width apart. 3. While you keep your back relaxed and keep your hips on the floor, slowly straighten your arms to raise the top half of your body and lift your shoulders. Do not use your back muscles. You may change where you place your hands in order to make yourself more comfortable. 4. Stay in this position for 5 seconds. 5. Slowly return to lying flat on the floor.  Bridges Do these steps 10 times in a row: 1. Lie on your back on a firm surface. 2. Bend your knees so they point up to the ceiling. Your feet should be flat on the floor. Your arms should be flat at your sides, next to your body. 3. Tighten your butt muscles and lift your butt off the floor until your waist is almost as high as your knees. If you do not feel the muscles working in your butt and the back of your thighs, slide your feet 1-2 inches farther away from your butt. 4. Stay in this position for 3-5 seconds. 5. Slowly lower your butt to the floor, and let your butt muscles relax. If this exercise is too easy, try doing it with your arms crossed over your chest. Belly crunches Do these steps 5-10 times in a row: 1. Lie on your back on a firm bed or the floor with your legs stretched out. 2. Bend your knees so they point up to the ceiling. Your feet should be flat on the floor. 3. Cross your arms over your chest. 4. Tip your chin a little bit toward your chest but do not bend  your neck. 5. Tighten your belly muscles and slowly raise your chest just enough to lift your  shoulder blades a tiny bit off of the floor. Avoid raising your body higher than that, because it can put too much stress on your low back. 6. Slowly lower your chest and your head to the floor. Back lifts Do these steps 5-10 times in a row: 1. Lie on your belly (face-down) with your arms at your sides, and rest your forehead on the floor. 2. Tighten the muscles in your legs and your butt. 3. Slowly lift your chest off of the floor while you keep your hips on the floor. Keep the back of your head in line with the curve in your back. Look at the floor while you do this. 4. Stay in this position for 3-5 seconds. 5. Slowly lower your chest and your face to the floor. Contact a doctor if:  Your back pain gets a lot worse when you do an exercise.  Your back pain does not get better 2 hours after you exercise. If you have any of these problems, stop doing the exercises. Do not do them again unless your doctor says it is okay. Get help right away if:  You have sudden, very bad back pain. If this happens, stop doing the exercises. Do not do them again unless your doctor says it is okay. This information is not intended to replace advice given to you by your health care provider. Make sure you discuss any questions you have with your health care provider. Document Released: 11/08/2010 Document Revised: 07/01/2018 Document Reviewed: 07/01/2018 Elsevier Patient Education  2020 [employer].

## 2019-10-06 NOTE — Assessment & Plan Note (Signed)
Pain appears to be chronic since his surgery rather than an acute exacerbation.  I am reassured that he is eating like normal and does not have nausea.  Prescribed Bentyl for pain relief and reassured patient that this should resolve with time.  Gave patient return precautions

## 2019-11-10 ENCOUNTER — Other Ambulatory Visit: Payer: Self-pay

## 2019-11-10 ENCOUNTER — Encounter: Payer: Self-pay | Admitting: Student in an Organized Health Care Education/Training Program

## 2019-11-10 ENCOUNTER — Ambulatory Visit (INDEPENDENT_AMBULATORY_CARE_PROVIDER_SITE_OTHER): Payer: Medicare Other | Admitting: Student in an Organized Health Care Education/Training Program

## 2019-11-10 DIAGNOSIS — K219 Gastro-esophageal reflux disease without esophagitis: Secondary | ICD-10-CM | POA: Diagnosis not present

## 2019-11-10 DIAGNOSIS — R109 Unspecified abdominal pain: Secondary | ICD-10-CM

## 2019-11-10 MED ORDER — ACETAMINOPHEN-CODEINE #3 300-30 MG PO TABS: 1.0000 | ORAL_TABLET | Freq: Three times a day (TID) | ORAL | 0 refills | Status: AC | PRN

## 2019-11-10 MED ORDER — METAMUCIL SMOOTH TEXTURE 58.6 % PO POWD: 1.0000 | Freq: Every day | ORAL | 12 refills | Status: DC

## 2019-11-10 MED ORDER — OMEPRAZOLE 40 MG PO CPDR: 40.0000 mg | DELAYED_RELEASE_CAPSULE | Freq: Every day | ORAL | 1 refills | Status: DC

## 2019-11-10 NOTE — Assessment & Plan Note (Signed)
Subacute worsening without signs of acute abdomen. Concern that could be problem with anastomosis or return of diverticular disease. Does not appear to have hernia. With history of diabetes could be due to poor digestion regulation as well. - recommended patient reach out to surgeon today to be seen ASAP - prescribed T3 again for pain in short term- asked him to avoid taking NSAIDs/aspirin - prescribed fiber supplement.  - could consider FOBT/CBC to assess for rectal bleeding/anemia.

## 2019-11-10 NOTE — Patient Instructions (Signed)
It was a pleasure to see you today!  To summarize our discussion for this visit:  I'm sorry to hear that you are experiencing pain with your abdomen again. I'm reassured that you can still eat without nausea/vomiting, still passing gas and stool and haven't had any other signs of illness. I do think given your history it would be wise to see your surgeon ASAP.  In the meantime, I'm refilling your T3 and giving some fiber.  If you do experience any of the symptoms we talked about including fever, excessive bleeding from rectum, not passing stool/gas, can't eat, please go to the emergency department.   Please return to our clinic to see me when you are feeling better for a well visit.  Call the clinic at [phone] if your symptoms worsen or you have any concerns.   Thank you for allowing me to take part in your care,  Dr. Doristine Mango

## 2019-11-10 NOTE — Progress Notes (Signed)
   Subjective:    Patient ID: [name], male    DOB: 1981/04/22, 39 y.o.   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 Described as sharp stabbing pain that is constantly present but more intense at some times and is located transiently periumbilical. Is not associated with food intake. Well controlled with leftover T3 from last appointment that ran out this week. Denies N/V, constipation, fever. Denies any out-pouching from abdomen when pressure increases. Had some blood in stool following the surgery. Endorses good PO intake, passing gas, and diarrhea which he has had intermittently since the surgery.  - has had multiple abdominal surgeries with multiple large scars on abdomen. 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   Smoking status reviewed   ROS: pertinent noted in the HPI   I have personally reviewed pertinent past medical history, surgical, family, and social history as appropriate. Objective:  BP 135/80   Pulse 96   Wt 266 lb 6.4 oz (120.8 kg)   SpO2 98%   BMI 41.72 kg/m   Vitals and nursing note reviewed  General: NAD, pleasant, able to participate in exam Abdomen:  Obese abdomen with multiple healed surgical scars. BS active diffusely. Negative for tenderness to palpation. Negative for erythema or drainage from skin.  No signs of hernia with bearing down. Extremities: no edema or cyanosis. Skin: warm and dry, no rashes noted Neuro: alert, no obvious focal deficits Psych: Normal affect and mood  Assessment & Plan:   Abdominal pain Subacute worsening without signs of acute abdomen. Concern that could be problem with anastomosis or return of diverticular disease. Does not appear to have hernia. With history of diabetes could be due to poor  digestion regulation as well. - recommended patient reach out to surgeon today to be seen ASAP - prescribed T3 again for pain in short term- asked him to avoid taking NSAIDs/aspirin - prescribed fiber supplement.  - could consider FOBT/CBC to assess for rectal bleeding/anemia.   Meds ordered this encounter  Medications  . acetaminophen-codeine (TYLENOL #3) 300-30 MG tablet    Sig: Take 1-2 tablets by mouth every 8 (eight) hours as needed for up to 14 days.    Dispense:  30 tablet    Refill:  0  . omeprazole (PRILOSEC) 40 MG capsule    Sig: Take 1 capsule (40 mg total) by mouth at bedtime.    Dispense:  90 capsule    Refill:  1  . psyllium (METAMUCIL SMOOTH TEXTURE) 58.6 % powder    Sig: Take 1 packet by mouth daily.    Dispense:  283 g    Refill:  [place] PGY-2

## 2019-11-14 ENCOUNTER — Telehealth: Payer: Self-pay | Admitting: Student in an Organized Health Care Education/Training Program

## 2019-11-15 NOTE — Telephone Encounter (Signed)
Appointment scheduled to assess blood work including A1c for abdominal pain per Dr. Saul Fordyce recommendations

## 2019-11-23 ENCOUNTER — Ambulatory Visit (INDEPENDENT_AMBULATORY_CARE_PROVIDER_SITE_OTHER): Payer: Medicare Other | Admitting: Student in an Organized Health Care Education/Training Program

## 2019-11-23 ENCOUNTER — Encounter: Payer: Self-pay | Admitting: Student in an Organized Health Care Education/Training Program

## 2019-11-23 ENCOUNTER — Other Ambulatory Visit: Payer: Self-pay

## 2019-11-23 VITALS — BP 134/86 | HR 102 | Ht 68.5 in | Wt 264.0 lb

## 2019-11-23 DIAGNOSIS — H00012 Hordeolum externum right lower eyelid: Secondary | ICD-10-CM

## 2019-11-23 DIAGNOSIS — H00019 Hordeolum externum unspecified eye, unspecified eyelid: Secondary | ICD-10-CM | POA: Insufficient documentation

## 2019-11-23 DIAGNOSIS — E119 Type 2 diabetes mellitus without complications: Secondary | ICD-10-CM

## 2019-11-23 LAB — POCT GLYCOSYLATED HEMOGLOBIN (HGB A1C): HbA1c, POC (controlled diabetic range): 10.5 % — AB (ref 0.0–7.0)

## 2019-11-23 MED ORDER — BLOOD GLUCOSE MONITORING SUPPL KIT: 1.0000 | PACK | Freq: Two times a day (BID) | 0 refills | Status: DC

## 2019-11-23 MED ORDER — EMPAGLIFLOZIN 25 MG PO TABS: ORAL_TABLET | ORAL | 0 refills | Status: DC

## 2019-11-23 NOTE — Progress Notes (Signed)
   CHIEF COMPLAINT / HPI:  F/u abdominal pain. Patient states that his abdominal pain has improved since last seeing him. The T3 from last appointment has helped significantly. He has not tried to follow up with his surgeon as he has been moving so I encouraged him to still do so.   We checked A1c and some basic labs today. A1c last checked 08/2018 was 6.1. today is 10.5. patient endorses polydipsia, polyuria, and fatigue along with his abdominal pain.   Stye- patient has stye on right lower lid that began this morning. Minimal discomfort but more of an annoyance. No drainage.   PERTINENT  PMH / PSH: In the past, patient has been on metformin (had throat closing reaction, per patient) and glipizide.  OBJECTIVE: BP 134/86   Pulse (!) 102   Ht 5' 8.5" (1.74 m)   Wt 264 lb (119.7 kg)   SpO2 98%   BMI 39.56 kg/m    General: NAD, pleasant, able to participate in exam HEENT: R lower lid stye without spreading erythema.  Abdomen: soft, nontender, nondistended, no hepatic or splenomegaly, +BS, multiple surgical scars Extremities: no edema or cyanosis. WWP. Skin: warm and dry, no rashes noted Neuro: alert and oriented x4, no focal deficits Psych: Normal affect and mood  ASSESSMENT / PLAN:  Type 2 diabetes mellitus without complication Y8A significantly higher than last checked which could account for some of his symptoms. Provided patient with extensive education on hyper/hypoglycemia symptoms and consequences of poorly controlled diabetes.  Patient is adamantly against any injectable medications and especially insulin and cannot take metformin. Continue atorvastatin- rechecking lipids Prescribed ramp up of empagliflozin Prescribed glucometer and supplies and instructions to check daily Scheduled with pharmacy for education and further medication management Also checking CMP, CBC, TSH, urine microalbumin today  Hordeolum externum (stye) Present for less than a day.  Recommended  frequent warm compresses. If not improved, return for further treatment     [place]

## 2019-11-23 NOTE — Assessment & Plan Note (Signed)
Present for less than a day.  Recommended frequent warm compresses. If not improved, return for further treatment

## 2019-11-23 NOTE — Assessment & Plan Note (Signed)
A1c significantly higher than last checked which could account for some of his symptoms. Provided patient with extensive education on hyper/hypoglycemia symptoms and consequences of poorly controlled diabetes.  Patient is adamantly against any injectable medications and especially insulin and cannot take metformin. Continue atorvastatin- rechecking lipids Prescribed ramp up of empagliflozin Prescribed glucometer and supplies and instructions to check daily Scheduled with pharmacy for education and further medication management Also checking CMP, CBC, TSH, urine microalbumin today

## 2019-11-23 NOTE — Patient Instructions (Signed)
It was a pleasure to see you today!  To summarize our discussion for this visit:  Your A1c which shows [place] the average of your blood sugars over the past 3 months is elevated to the poorly controlled diabetes range of 10.5.  Having these high blood sugars is likely contributing to your fatigue, increased thirst and increased urine output as well as other symptoms possibly.  I recommend that we start a medication today to help with your blood sugars and that you start monitoring your blood sugars at home.  I am also making you an appointment with our pharmacy team to help further evaluate what medications would benefit you the most.  For the stye on your eyelid, I would recommend warm compresses throughout the day so that the duct can drain.  February 18th at 3:30 for a pharmacy team visit  Some additional health maintenance measures we should update are: Health Maintenance Due  Topic Date Due  . OPHTHALMOLOGY EXAM  06/18/1991  . FOOT EXAM  09/11/2019  . URINE MICROALBUMIN  09/11/2019  .   Please return to our clinic to see me in about 1 month if you see the pharmacy team in between or sooner if you do not see them.  Call the clinic at [phone] if your symptoms worsen or you have any concerns.   Thank you for allowing me to take part in your care,  Dr. Doristine Mango

## 2019-11-24 ENCOUNTER — Telehealth: Payer: Self-pay

## 2019-11-24 LAB — CBC
Hematocrit: 47 % (ref 37.5–51.0)
Hemoglobin: 15.2 g/dL (ref 13.0–17.7)
MCH: 26.2 pg — ABNORMAL LOW (ref 26.6–33.0)
MCHC: 32.3 g/dL (ref 31.5–35.7)
MCV: 81 fL (ref 79–97)
Platelets: 197 10*3/uL (ref 150–450)
RBC: 5.8 x10E6/uL (ref 4.14–5.80)
RDW: 18 % — ABNORMAL HIGH (ref 11.6–15.4)
WBC: 9.3 10*3/uL (ref 3.4–10.8)

## 2019-11-24 LAB — LIPID PANEL
Chol/HDL Ratio: 6.6 ratio — ABNORMAL HIGH (ref 0.0–5.0)
Cholesterol, Total: 217 mg/dL — ABNORMAL HIGH (ref 100–199)
HDL: 33 mg/dL — ABNORMAL LOW (ref >39)
LDL Chol Calc (NIH): 124 mg/dL — ABNORMAL HIGH (ref 0–99)
Triglycerides: 337 mg/dL — ABNORMAL HIGH (ref 0–149)
VLDL Cholesterol Cal: 60 mg/dL — ABNORMAL HIGH (ref 5–40)

## 2019-11-24 LAB — COMPREHENSIVE METABOLIC PANEL
ALT: 56 IU/L — ABNORMAL HIGH (ref 0–44)
AST: 35 IU/L (ref 0–40)
Albumin/Globulin Ratio: 1.4 (ref 1.2–2.2)
Albumin: 4.6 g/dL (ref 4.0–5.0)
Alkaline Phosphatase: 111 IU/L (ref 39–117)
BUN/Creatinine Ratio: 7 — ABNORMAL LOW (ref 9–20)
BUN: 7 mg/dL (ref 6–20)
Bilirubin Total: 0.2 mg/dL (ref 0.0–1.2)
CO2: 23 mmol/L (ref 20–29)
Calcium: 9.2 mg/dL (ref 8.7–10.2)
Chloride: 97 mmol/L (ref 96–106)
Creatinine, Ser: 1 mg/dL (ref 0.76–1.27)
GFR calc Af Amer: 110 mL/min/{1.73_m2} (ref >59)
GFR calc non Af Amer: 95 mL/min/{1.73_m2} (ref >59)
Globulin, Total: 3.3 g/dL (ref 1.5–4.5)
Glucose: 308 mg/dL — ABNORMAL HIGH (ref 65–99)
Potassium: 4 mmol/L (ref 3.5–5.2)
Sodium: 135 mmol/L (ref 134–144)
Total Protein: 7.9 g/dL (ref 6.0–8.5)

## 2019-11-24 LAB — TSH: TSH: 1.21 u[IU]/mL (ref 0.450–4.500)

## 2019-11-24 NOTE — Telephone Encounter (Signed)
[name] from [facility] calls nurse line regarding clarification for jardiance. Per pharmacist, they cannot split the $RemoveB'25mg'SfTBjPfq$  tablets into $RemoveBefo'10mg'QDyjpbyMHXu$ . Per order, patient is to receive $RemoveBe'10mg'eouXtIbBS$  for first 10 days and then increase to 25. Pharmacist wanted to clarify if they needed to put in a new order for the first $RemoveB'10mg'qMknKegq$  or how the provider would like the rx to be filled.   Please advise  To PCP  Talbot Grumbling, RN

## 2019-11-25 ENCOUNTER — Telehealth: Payer: Self-pay | Admitting: Student in an Organized Health Care Education/Training Program

## 2019-11-25 NOTE — Chronic Care Management (AMB) (Signed)
  Chronic Care Management   Note  11/25/2019 Name: [name] MRN: [id number] DOB: 30-Apr-1981  GIANCARLOS BERENDT is a 39 y.o. year old male who is a primary care patient of [name], DO. I reached out to Tobias Alexander by phone today in response to a referral sent by Mr. Torry Istre Stroble's PCP, Doristine Mango DO     Mr. Barris was given information about Chronic Care Management services today including:  1. CCM service includes personalized support from designated clinical staff supervised by his physician, including individualized plan of care and coordination with other care providers 2. 24/7 contact phone numbers for assistance for urgent and routine care needs. 3. Service will only be billed when office clinical staff spend 20 minutes or more in a month to coordinate care. 4. Only one practitioner may furnish and bill the service in a calendar month. 5. The patient may stop CCM services at any time (effective at the end of the month) by phone call to the office staff. 6. The patient will be responsible for cost sharing (co-pay) of up to 20% of the service fee (after annual deductible is met).  Patient agreed to services and verbal consent obtained.   Follow up plan: Telephone appointment with care management team member scheduled for:12/01/2019  Glenna Durand, LPN Health Advisor, [facility] ??[email or web address] ??[phone]

## 2019-11-28 NOTE — Telephone Encounter (Signed)
That sounds good. Thank you.

## 2019-11-28 NOTE — Telephone Encounter (Signed)
Yes, I would like him to start with the $Remove'10mg'ECFbIJb$  pills and then be ramped up to the $Remo'25mg'VdKmF$  pills per the prescription instructions. If I need to send in a separate prescription for the $Remov'10mg'JUrOyA$  I can.

## 2019-11-28 NOTE — Telephone Encounter (Signed)
Called pharmacy and provided clarification regarding jardiance order. Pharmacy requested verbal order for $RemoveB'10mg'QtMjlbNq$  for 10 days, provided per Dr. Tonette Bihari instructions.   Talbot Grumbling, RN

## 2019-12-01 ENCOUNTER — Other Ambulatory Visit: Payer: Self-pay

## 2019-12-01 ENCOUNTER — Telehealth: Payer: Self-pay

## 2019-12-01 ENCOUNTER — Telehealth: Payer: Medicare Other

## 2019-12-01 NOTE — Telephone Encounter (Signed)
Called patient and he states that he will call back to schedule appointment when he has more time.  Ozella Almond, [place]

## 2019-12-01 NOTE — Telephone Encounter (Signed)
-----   Message from [name], DO sent at 11/28/2019  1:50 PM EST ----- Regarding: FW: f/u apt  ----- Message ----- From: [name], DO Sent: 11/28/2019   1:37 PM EST To: [name], DO Subject: f/u apt                                        Hey team, can you please schedule patient for follow up in about 1-2 weeks? Thanks

## 2019-12-02 DIAGNOSIS — I10 Essential (primary) hypertension: Secondary | ICD-10-CM | POA: Diagnosis not present

## 2019-12-02 DIAGNOSIS — Z888 Allergy status to other drugs, medicaments and biological substances status: Secondary | ICD-10-CM | POA: Diagnosis not present

## 2019-12-02 DIAGNOSIS — Z880 Allergy status to penicillin: Secondary | ICD-10-CM | POA: Diagnosis not present

## 2019-12-02 DIAGNOSIS — L03011 Cellulitis of right finger: Secondary | ICD-10-CM | POA: Diagnosis not present

## 2019-12-02 DIAGNOSIS — Z720 Tobacco use: Secondary | ICD-10-CM | POA: Diagnosis not present

## 2019-12-02 DIAGNOSIS — K219 Gastro-esophageal reflux disease without esophagitis: Secondary | ICD-10-CM | POA: Diagnosis not present

## 2019-12-02 DIAGNOSIS — Z79899 Other long term (current) drug therapy: Secondary | ICD-10-CM | POA: Diagnosis not present

## 2019-12-08 ENCOUNTER — Telehealth: Payer: Medicare Other | Admitting: Pharmacist

## 2019-12-08 ENCOUNTER — Telehealth: Payer: Self-pay | Admitting: Pharmacist

## 2019-12-08 DIAGNOSIS — E119 Type 2 diabetes mellitus without complications: Secondary | ICD-10-CM

## 2019-12-08 NOTE — Telephone Encounter (Signed)
Noted and agree. 

## 2019-12-08 NOTE — Assessment & Plan Note (Signed)
Blood glucose likely under poor control, patient reports polyuria and dry mouth.  Encouraged to drink water and cut back on sugary beverages (he admits to drinking lots of [employer] drinks).   He does not have a blood glucose meter nor does he have the prescribed Jardiance (empagliflozin) at this time.  He hopes both of these will be delivered today or tomorrow as they were mailed from his pharmacy two days ago.   He is confident he can set-up and use the blood glucometer (both parents have DM and use glucometers).  He will plan to check at least once daliy in the AM.   He will start Jardiance upon arrival. Patient educated on purpose, proper use and potential adverse effects of infection. .  Following instruction patient verbalized understanding of treatment plan.   I plan to contact him by phone in 5 days to reevaluate status. Reevaluate DM control at that time.  Patient verbalized understanding of treatment plan.

## 2019-12-08 NOTE — Telephone Encounter (Signed)
Contacted patient in place of an in-person visit due to weather cancellation of clinic.   Telephone encounter for Diabetes revealed he is no longer taking metformin due to throat closing and difficulty swallowing with metformin (allergy/intolerance list updated).   Blood glucose likely under poor control, patient reports polyuria and dry mouth.  Encouraged to drink water and cut back on sugary beverages (he admits to drinking lots of [employer] drinks).   He does not have a blood glucose meter nor does he have the prescribed Jardiance (empagliflozin) at this time.  He hopes both of these will be delivered today or tomorrow as they were mailed from his pharmacy two days ago.   He is confident he can set-up and use the blood glucometer (both parents have DM and use glucometers).  He will plan to check at least once daliy in the AM.   He will start Jardiance upon arrival. Patient educated on purpose, proper use and potential adverse effects of infection. .  Following instruction patient verbalized understanding of treatment plan.   Uninterested in injection therapy at this time.   I plan to contact him by phone in 5 days to reevaluate status. Reevaluate DM control at that time.  Patient verbalized understanding of treatment plan.

## 2019-12-19 ENCOUNTER — Other Ambulatory Visit: Payer: Self-pay | Admitting: Student in an Organized Health Care Education/Training Program

## 2019-12-21 ENCOUNTER — Telehealth: Payer: Self-pay | Admitting: Pharmacist

## 2019-12-21 DIAGNOSIS — E119 Type 2 diabetes mellitus without complications: Secondary | ICD-10-CM

## 2019-12-21 NOTE — Telephone Encounter (Signed)
Reviewed and agree.

## 2019-12-21 NOTE — Telephone Encounter (Signed)
Phone call to patient RE diabetes follow-up.   Patient initially apologized for not picking up phone while he was at work last week.  He reports checking sugars and expressed concern over elevated readings.  He reports 200-400 readings for the last week.   He reports taking Jardiance $RemoveBeforeDE'25mg'tTDFoRcjfJrkDor$  daily.  He takes no other medication for blood glucose reduction.   Following brief discussion he agreed that he needs more medication and stated a willingness to initiate injection therapy.  He was accepting of injections into his arms (was not interested in abdominal injections).    I offered a next day visit with me or the opportunity to return to see Dr. Prince Rome at her earliest available.  He was unable to come on 3/4 and asked for early next week.   He agreed to come to office 3/9 for a education and implementation of injection therapy.  I encouraged him to stay hydrated.  I will continue to focus on reducing "monster energy" drink intake at next visit.

## 2019-12-21 NOTE — Assessment & Plan Note (Signed)
Patient initially apologized for not picking up phone while he was at work last week.  He reports checking sugars and expressed concern over elevated readings.  He reports 200-400 readings for the last week.   He reports taking Jardiance $RemoveBeforeDE'25mg'GTfYQLlPqCXZpgJ$  daily.  He takes no other medication for blood glucose reduction.   Following brief discussion he agreed that he needs more medication and stated a willingness to initiate injection therapy.  He was accepting of injections into his arms (was not interested in abdominal injections).    I offered a next day visit with me or the opportunity to return to see Dr. Prince Rome at her earliest available.  He was unable to come on 3/4 and asked for early next week.   He agreed to come to office 3/9 for a education and implementation of injection therapy.

## 2019-12-21 NOTE — Telephone Encounter (Signed)
Thanks for following up.

## 2019-12-22 ENCOUNTER — Telehealth: Payer: Self-pay | Admitting: Student in an Organized Health Care Education/Training Program

## 2019-12-22 NOTE — Telephone Encounter (Signed)
Dr. Valentina Lucks was able to get him in for apt 3/9. Thank you for trying.

## 2019-12-22 NOTE — Telephone Encounter (Signed)
-----   Message from [name], DO sent at 12/21/2019  1:28 PM EST ----- Regarding: fu apt Hey team,  Can you please schedule this patient for a very soon f/u apt for dm? Thanks

## 2019-12-22 NOTE — Telephone Encounter (Signed)
Attempted to call patient to make an appointment.  The number was answered by someone who said that he did not know the patient.  [name], [place]

## 2019-12-22 NOTE — Telephone Encounter (Signed)
[facility], called to report [name]  ([id number]) A1C was 11.9.  She advised him to call and make an appt.  Call back not necessary but her ph# is [phone]

## 2019-12-27 ENCOUNTER — Ambulatory Visit (INDEPENDENT_AMBULATORY_CARE_PROVIDER_SITE_OTHER): Payer: Medicare Other | Admitting: Pharmacist

## 2019-12-27 ENCOUNTER — Other Ambulatory Visit: Payer: Self-pay

## 2019-12-27 ENCOUNTER — Encounter: Payer: Self-pay | Admitting: Pharmacist

## 2019-12-27 DIAGNOSIS — E119 Type 2 diabetes mellitus without complications: Secondary | ICD-10-CM

## 2019-12-27 MED ORDER — BASAGLAR KWIKPEN 100 UNIT/ML ~~LOC~~ SOPN: 10.0000 [IU] | PEN_INJECTOR | SUBCUTANEOUS | 0 refills | Status: DC

## 2019-12-27 MED ORDER — INSULIN PEN NEEDLE 31G X 5 MM MISC: 1.0000 | Freq: Once | 99 refills | Status: AC

## 2019-12-27 MED ORDER — INSULIN GLARGINE 100 UNIT/ML SOLOSTAR PEN: 40.0000 [IU] | PEN_INJECTOR | SUBCUTANEOUS | 11 refills | Status: DC

## 2019-12-27 NOTE — Progress Notes (Signed)
    S:     Chief Complaint  Patient presents with  . Medication Management    DM    Patient arrives in good spirits, ambulating without assistance.  Presents for diabetes evaluation, education, and management Patient was referred and last seen by Primary Care Provider on [place].  Patient was referred on 12/22/19.  Patient was last seen by Primary Care Provider on 11/23/19.   Patient reports Diabetes was diagnosed in 2019.   Family/Social History: Father with diabetes and MI with stent placement.  Insurance coverage/medication affordability: Medicaid, Medicare, [employer].   Patient reports adherence with medications.  Current diabetes medications include: Jardiance 25 mg daily. Current hypertension medications include: None Current hyperlipidemia medications include: Atorvastatin 40 mg daily.  Patient reports hypoglycemic events. One event after an increase in physical activity.  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 3x/night. Patient reports neuropathy (nerve pain). Tingling and some numbness in hands and feet.  Patient denies visual changes. Last eye visit >1 year. Patient denies self foot exams.     O:  Physical Exam   ROS   Lab Results  Component Value Date   HGBA1C 10.5 (A) 11/23/2019   There were no vitals filed for this visit.  Lipid Panel     Component Value Date/Time   CHOL 217 (H) 11/23/2019 1208   TRIG 337 (H) 11/23/2019 1208   HDL 33 (L) 11/23/2019 1208   CHOLHDL 6.6 (H) 11/23/2019 1208   CHOLHDL 11.7 (H) 12/11/2015 1423   VLDL 40 (H) 12/11/2015 1423   LDLCALC 124 (H) 11/23/2019 1208   LDLDIRECT 63 07/23/2012 1552    Home fasting blood sugars: 300-350s.     Clinical Atherosclerotic Cardiovascular Disease (ASCVD): No  The ASCVD Risk score Mikey Bussing DC Jr., et al., 2013) failed to calculate for the following reasons:   The 2013 ASCVD risk score is only valid for ages 60 to 70    A/P: Diabetes since 2019, currently uncontrolled. Patient is able to verbalize appropriate hypoglycemia management plan. Patient is adherent with medication. Control is suboptimal due to diet inconsistencies. -Started basal insulin Basaglar (insulin glargine). Patient will continue to titrate 1 unit every day if fasting blood sugar > $Remov'100mg'jIdXUQ$ /dl until fasting blood sugars reach goal or next visit. -Patient to complete insuline glargine supply then switch to Lantus solostar. -Start basal insulin Lantus Solostar (insulin glargine) when done with basaglar, continuing the same dose. -Continued SGLT2-I Jardiance (empagliflozin) at 25 mg by mouth once a day.  -Extensively discussed pathophysiology of diabetes, recommended lifestyle interventions, dietary effects on blood sugar control -Counseled on s/sx of and management of hypoglycemia.  -Next A1C anticipated in 3 months.   ASCVD risk - secondary prevention in patient with diabetes. Last LDL is not controlled. Aspirin is not indicated.  -Continued atorvastatin 40 mg.  Tobacco abuse.  Patient willing to consider cutting back to ~ 0.5 ppd.  Reassess at next visit.   Written patient instructions provided.  Total time in face to face counseling 30 minutes.   Follow up Pharmacist over phone in ~1 week.   Patient seen with Marin Roberts, PharmD PGY-1 Resident.

## 2019-12-27 NOTE — Assessment & Plan Note (Signed)
Diabetes since 2019, currently uncontrolled. Patient is able to verbalize appropriate hypoglycemia management plan. Patient is adherent with medication. Control is suboptimal due to diet inconsistencies. -Started basal insulin Basaglar (insulin glargine). Patient will continue to titrate 1 unit every day if fasting blood sugar > $Remov'100mg'RiJWaU$ /dl until fasting blood sugars reach goal or next visit. -Patient to complete insuline glargine supply then switch to Lantus solostar. -Start basal insulin Lantus Solostar (insulin glargine) when done with basaglar, continuing the same dose. -Continued SGLT2-I Jardiance (empagliflozin) at 25 mg by mouth once a day.  -Extensively discussed pathophysiology of diabetes, recommended lifestyle interventions, dietary effects on blood sugar control -Counseled on s/sx of and management of hypoglycemia.  -Next A1C anticipated in 3 months.

## 2019-12-27 NOTE — Patient Instructions (Signed)
Great to see you today.    Please inject 11 units tomorrow then continue to increase by 1 unit each day until you have morning readings of ~ 100.   We will plan to call you in ~ 1 week.

## 2019-12-27 NOTE — Progress Notes (Signed)
Reviewed: I agree with Dr. Koval's documentation and management. 

## 2020-03-01 ENCOUNTER — Ambulatory Visit (INDEPENDENT_AMBULATORY_CARE_PROVIDER_SITE_OTHER): Payer: Medicare Other | Admitting: Student in an Organized Health Care Education/Training Program

## 2020-03-01 ENCOUNTER — Other Ambulatory Visit: Payer: Self-pay

## 2020-03-01 ENCOUNTER — Encounter: Payer: Self-pay | Admitting: Student in an Organized Health Care Education/Training Program

## 2020-03-01 VITALS — BP 124/82 | HR 94 | Ht 68.0 in | Wt 250.4 lb

## 2020-03-01 DIAGNOSIS — E1165 Type 2 diabetes mellitus with hyperglycemia: Secondary | ICD-10-CM | POA: Insufficient documentation

## 2020-03-01 DIAGNOSIS — K920 Hematemesis: Secondary | ICD-10-CM

## 2020-03-01 DIAGNOSIS — K921 Melena: Secondary | ICD-10-CM | POA: Diagnosis not present

## 2020-03-01 DIAGNOSIS — Z794 Long term (current) use of insulin: Secondary | ICD-10-CM | POA: Diagnosis not present

## 2020-03-01 DIAGNOSIS — E119 Type 2 diabetes mellitus without complications: Secondary | ICD-10-CM | POA: Diagnosis not present

## 2020-03-01 LAB — POCT GLYCOSYLATED HEMOGLOBIN (HGB A1C): HbA1c, POC (controlled diabetic range): 10.5 % — AB (ref 0.0–7.0)

## 2020-03-01 LAB — POCT HEMOGLOBIN: Hemoglobin: 15.1 g/dL — AB (ref 11–14.6)

## 2020-03-01 NOTE — Patient Instructions (Signed)
It was a pleasure to see you today!  To summarize our discussion for this visit:  Your hemoglobin was in a normal range so this makes me less concerned that you are losing a large amount of blood. But I recommend you take your omeprozole, stop taking ibuprofen, and follow up with your surgeon as well as see a GI specialist.  For your diabetes, your A1c is still elevated to 10.5. please take your insulin every day as well as the juardiance and record your blood sugars every morning and afternoon.   Come back to see me in 2 weeks.  Some additional health maintenance measures we should update are: Health Maintenance Due  Topic Date Due  . OPHTHALMOLOGY EXAM  Never done  . COVID-19 Vaccine (1) Never done  . FOOT EXAM  09/11/2019  . URINE MICROALBUMIN  09/11/2019  .    Call the clinic at [phone] if your symptoms worsen or you have any concerns.   Thank you for allowing me to take part in your care,  Dr. Doristine Mango   Blood Glucose Monitoring, Adult Monitoring your blood sugar (glucose) is an important part of managing your diabetes (diabetes mellitus). Blood glucose monitoring involves checking your blood glucose as often as directed and keeping a record (log) of your results over time. Checking your blood glucose regularly and keeping a blood glucose log can:  Help you and your health care provider adjust your diabetes management plan as needed, including your medicines or insulin.  Help you understand how food, exercise, illnesses, and medicines affect your blood glucose.  Let you know what your blood glucose is at any time. You can quickly find out if you have low blood glucose (hypoglycemia) or high blood glucose (hyperglycemia). Your health care provider will set individualized treatment goals for you. Your goals will be based on your age, other medical conditions you have, and how you respond to diabetes treatment. Generally, the goal of treatment is to maintain the  following blood glucose levels:  Before meals (preprandial): 80-130 mg/dL (4.4-7.2 mmol/L).  After meals (postprandial): below 180 mg/dL (10 mmol/L).  A1c level: less than 7%. Supplies needed:  Blood glucose meter.  Test strips for your meter. Each meter has its own strips. You must use the strips that came with your meter.  A needle to prick your finger (lancet). Do not use a lancet more than one time.  A device that holds the lancet (lancing device).  A journal or log book to write down your results. How to check your blood glucose  1. Wash your hands with soap and water. 2. Prick the side of your finger (not the tip) with the lancet. Use a different finger each time. 3. Gently rub the finger until a small drop of blood appears. 4. Follow instructions that come with your meter for inserting the test strip, applying blood to the strip, and using your blood glucose meter. 5. Write down your result and any notes. Some meters allow you to use areas of your body other than your finger (alternative sites) to test your blood. The most common alternative sites are:  Forearm.  Thigh.  Palm of the hand. If you think you may have hypoglycemia, or if you have a history of not knowing when your blood glucose is getting low (hypoglycemia unawareness), do not use alternative sites. Use your finger instead. Alternative sites may not be as accurate as the fingers, because blood flow is slower in these areas. This means  that the result you get may be delayed, and it may be different from the result that you would get from your finger. Follow these instructions at home: Blood glucose log   Every time you check your blood glucose, write down your result. Also write down any notes about things that may be affecting your blood glucose, such as your diet and exercise for the day. This information can help you and your health care provider: ? Look for patterns in your blood glucose over  time. ? Adjust your diabetes management plan as needed.  Check if your meter allows you to download your records to a computer. Most glucose meters store a record of glucose readings in the meter. If you have type 1 diabetes:  Check your blood glucose 2 or more times a day.  Also check your blood glucose: ? Before every insulin injection. ? Before and after exercise. ? Before meals. ? 2 hours after a meal. ? Occasionally between 2:00 a.m. and 3:00 a.m., as directed. ? Before potentially dangerous tasks, like driving or using heavy machinery. ? At bedtime.  You may need to check your blood glucose more often, up to 6-10 times a day, if you: ? Use an insulin pump. ? Need multiple daily injections (MDI). ? Have diabetes that is not well-controlled. ? Are ill. ? Have a history of severe hypoglycemia. ? Have hypoglycemia unawareness. If you have type 2 diabetes:  If you take insulin or other diabetes medicines, check your blood glucose 2 or more times a day.  If you are on intensive insulin therapy, check your blood glucose 4 or more times a day. Occasionally, you may also need to check between 2:00 a.m. and 3:00 a.m., as directed.  Also check your blood glucose: ? Before and after exercise. ? Before potentially dangerous tasks, like driving or using heavy machinery.  You may need to check your blood glucose more often if: ? Your medicine is being adjusted. ? Your diabetes is not well-controlled. ? You are ill. General tips  Always keep your supplies with you.  If you have questions or need help, all blood glucose meters have a 24-hour "hotline" phone number that you can call. You may also contact your health care provider.  After you use a few boxes of test strips, adjust (calibrate) your blood glucose meter by following instructions that came with your meter. Contact a health care provider if:  Your blood glucose is at or above 240 mg/dL (13.3 mmol/L) for 2 days in a  row.  You have been sick or have had a fever for 2 days or longer, and you are not getting better.  You have any of the following problems for more than 6 hours: ? You cannot eat or drink. ? You have nausea or vomiting. ? You have diarrhea. Get help right away if:  Your blood glucose is lower than 54 mg/dL (3 mmol/L).  You become confused or you have trouble thinking clearly.  You have difficulty breathing.  You have moderate or large ketone levels in your urine. Summary  Monitoring your blood sugar (glucose) is an important part of managing your diabetes (diabetes mellitus).  Blood glucose monitoring involves checking your blood glucose as often as directed and keeping a record (log) of your results over time.  Your health care provider will set individualized treatment goals for you. Your goals will be based on your age, other medical conditions you have, and how you respond to diabetes treatment.  Every time you check your blood glucose, write down your result. Also write down any notes about things that may be affecting your blood glucose, such as your diet and exercise for the day. This information is not intended to replace advice given to you by your health care provider. Make sure you discuss any questions you have with your health care provider. Document Revised: 07/30/2018 Document Reviewed: 03/17/2016 Elsevier Patient Education  [employer].  Insulin Treatment for Diabetes Mellitus Diabetes (diabetes mellitus) is a long-term (chronic) disease. It occurs when the body does not properly use sugar (glucose) that is released from food after digestion. Glucose levels are controlled by a hormone called insulin. Insulin is made in the pancreas, which is an organ behind the stomach.  If you have type 1 diabetes, you must take insulin because your pancreas does not make any.  If you have type 2 diabetes, you might need to take insulin along with other medicines. In type 2  diabetes, one or both of these problems may be present: ? The pancreas does not make enough insulin. ? Cells in the body do not respond properly to insulin that the body makes (insulin resistance). You must use insulin correctly to control your diabetes. You must have some insulin in your body at all times. Insulin treatment varies depending on your type of diabetes, your treatment goals, and your medical history. Ask questions to understand your insulin treatment plan so you can be an active partner in managing your diabetes. How is insulin given? Insulin can only be given through a shot (injection). It is injected using a syringe and needle, an insulin pen, a pump, or a jet injector. Your health care provider will:  Prescribe the type and amount of insulin that you need.  Tell you when you should inject your insulin. Where on the body should insulin be injected? Insulin is injected into a layer of fatty tissue under the skin. Good places to inject insulin include:  Abdomen. Generally, the abdomen is the best place to inject insulin. However, you should avoid any area that is less than 2 inches (5 cm) from the belly button (navel).  Front of thigh.  Upper, outer side of thigh.  Upper, outer side of arm.  Upper, outer part of buttock. It is important to:  Give your injection in a slightly different place each time. This helps to prevent irritation and improve absorption.  Avoid injecting into areas that have scar tissue. Usually, you will give yourself insulin injections. Others can also be taught how to give you injections. You will use a special type of syringe that is made only for insulin. Some people may have an insulin pump that delivers insulin steadily through a tube (cannula) that is placed under the skin. What are the different types of insulin? The following information is a general guide to different types of insulin. Specifics vary depending on the insulin product that your  health care provider prescribes.  Rapid-acting insulin: ? Starts working quickly, in as little as 5 minutes. ? Can last for 4-6 hours (or sometimes longer). ? Works well when taken right before a meal to quickly lower your blood glucose.  Short-acting insulin: ? Starts working in about 30 minutes. ? Can last for 6-10 hours. ? Should be taken about 30 minutes before you start eating a meal.  Intermediate-acting insulin: ? Starts working in 1-2 hours. ? Lasts for about 10-18 hours. ? Lowers your blood glucose for a longer period  of time, but it is not as effective for lowering blood glucose right after a meal.  Long-acting insulin: ? Mimics the small amount of insulin that your pancreas usually produces throughout the day. ? Should be used one or two times a day. ? Is usually used in combination with other types of insulin or other medicines.  Concentrated insulin, or U-500 insulin: ? Contains a higher dose of insulin than most rapid-acting insulins. U-500 insulin has 5 times the amount of insulin per 1 mL. ? Should only be used with the special U-500 syringe or U-500 insulin pen. It is dangerous to use the wrong type of syringe with this insulin. What are the side effects of insulin? Possible side effects of insulin treatment include:  Low blood glucose (hypoglycemia).  Weight gain.  High blood glucose (hyperglycemia).  Skin injury or irritation. Some of these side effects can be caused by using improper injection technique. Be sure to learn how to inject insulin properly. What are common terms associated with insulin treatment? Some terms that you might hear include:  Basal insulin, or basal rate. This is the constant amount of insulin that needs to be present in your body to keep your blood glucose levels stable. People who have type 1 diabetes need basal insulin in a steady (continuous) dose 24 hours a day. ? Usually, intermediate-acting or long-acting insulin is used one or  two times a day to manage basal insulin levels. ? Medicines that are taken by mouth may also be recommended to manage basal insulin levels.  Prandial or nutrition insulin. This refers to meal-related insulin. ? Blood glucose rises quickly after a meal (postprandial). Rapid-acting or short-acting insulin can be used right before a meal (preprandial) to quickly lower your blood glucose. ? You may be instructed to adjust the amount of prandial insulin that you take, based on how much carbohydrate (starch) is in your meal.  Corrective insulin. This may also be called a correction dose or supplemental dose. This is a small amount of rapid-acting or short-acting insulin that can be used to lower your blood glucose if it is too high. You may be instructed to check your blood glucose at certain times of the day and use corrective insulin as needed.  Tight control, or intensive therapy. This means keeping your blood glucose as close to your target as possible, and preventing your blood glucose from getting too high after meals. People who have tight control of their diabetes have fewer long-term problems caused by diabetes. Follow these instructions at home: Talk with your health care provider or pharmacist about the type of insulin you should take and when you should take it. You should know when your insulin goes up the most (peaks) and when it wears off. You need this information so you can plan your meals and exercise. Work with your health care provider to:  Check your blood glucose every day. Your health care provider will tell you how often and when you should do this.  Manage your: ? Weight. ? Blood pressure. ? Cholesterol. ? Stress.  Eat a healthy diet.  Exercise regularly. Summary  Diabetes is a long-term (chronic) disease. It occurs when the body does not properly use sugar (glucose) that is released from food after digestion. Glucose levels are controlled by a hormone called insulin,  which is made in an organ behind your stomach (pancreas).  You must use insulin correctly to control your diabetes. You must have some insulin in your body at  all times.  Insulin treatment varies depending on your type of diabetes, your treatment goals, and your medical history.  Talk with your health care provider or pharmacist about the type of insulin you should take and when you should take it.  Check your blood glucose every day. Your health care provider will tell you how often and when you should check it. This information is not intended to replace advice given to you by your health care provider. Make sure you discuss any questions you have with your health care provider. Document Revised: 10/09/2017 Document Reviewed: 11/09/2015 Elsevier Patient Education  2020 [employer].

## 2020-03-01 NOTE — Assessment & Plan Note (Addendum)
Continuous since surgery a year ago but worsened in past few weeks.  hgb stable. Not symptomatic today. Suspecting hemorrhoids but patient refused a rectal exam today stating he didn't have time- promised to come back for this. - recommended again that he follow up with his surgeon post ileostomy reversal.

## 2020-03-01 NOTE — Assessment & Plan Note (Signed)
Poorly controlled. A1c 10.5 today.  Patient non-adherent with insulin or blood sugar monitoring.  Provided patient with education and strong recommendations to monitor and take insulin daily. Provided patient with blood sugar diary. Requested he bring in the diary and his wife to next appointment in 2 weeks.

## 2020-03-01 NOTE — Assessment & Plan Note (Signed)
Resolved spontaneously. hgb today stable at 15. And not symptomatic for anemia. Advised patient to discontinue NSAID use.  Continue omeprazole daily - GI referral

## 2020-03-01 NOTE — Progress Notes (Signed)
SUBJECTIVE:   CHIEF COMPLAINT / HPI: DM f/u and hematemesis/hematechezia   Diabetes-poorly controlled.  Patient states that he has not been taking his insulin every day.  The last time he took it was approximately 2 weeks ago.  He thinks that he took it 1 or 2 times a week before that.  He does not know the dosage because his wife is the one who gave it to him.  He does not check his blood sugars at home every day but thinks that he checks it 1-2 times per week.  His blood sugars are generally in the mid 200s sometimes into 3 and 400s in the mornings.  He states that he does not take his insulin because these levels of blood sugars are normal.  Endorses having tingling sensation in hands and lower extremities.  Endorses polyuria.  States that he does take his Jardiance every day.  Hematemesis-occurred 3-4 times within a few minutes 2 to 3 weeks ago.  Patient had acute onset nausea and right upper quadrant burning pain prior to the emesis.  After he had finished throwing up he took some ibuprofen and laid down to take a nap which resolved the pain and nausea.  He has not had any recurrence of hematemesis since that time.  He has had increased frequency of stool since that time.  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  Frequency has now increased to approximately 5 times per day and the stool is positive for bright red blood.  He also has bright red blood on the toilet paper when he wipes.  Denies feeling any masses or growths or pain when he wipes.  Denies any history of hemorrhoids. Then states he hasn't had a BM in 2 days. Patient declines a rectal exam today stating that he does not have time and will have to schedule another appointment. Patient's states that he has lightheadedness when standing occasionally but has never passed out.  Denies shortness of breath or chest pain.  PERTINENT  PMH / PSH: Partial colectomy  OBJECTIVE:   BP 124/82   Pulse 94    Ht 5\' 8"  (1.727 m)   Wt 250 lb 6.4 oz (113.6 kg)   SpO2 97%   BMI 38.07 kg/m   General: NAD, pleasant, able to participate in exam Cardiac: RRR, normal heart sounds, no murmurs. 2+ radial and PT pulses bilaterally Respiratory: CTAB, normal effort, No wheezes, rales or rhonchi Abdomen: soft, nontender, nondistended, no hepatic or splenomegaly, +BS Extremities: no edema or cyanosis. WWP. Skin: warm and dry, no rashes noted Neuro: alert and oriented x4, no focal deficits Psych: Normal affect and mood  ASSESSMENT/PLAN:   Type 2 diabetes mellitus with hyperglycemia (HCC) Poorly controlled. A1c 10.5 today.  Patient non-adherent with insulin or blood sugar monitoring.  Provided patient with education and strong recommendations to monitor and take insulin daily. Provided patient with blood sugar diary. Requested he bring in the diary and his wife to next appointment in 2 weeks.  Hematemesis Resolved spontaneously. hgb today stable at 15. And not symptomatic for anemia. Advised patient to discontinue NSAID use.  Continue omeprazole daily - GI referral   Hematochezia Continuous since surgery a year ago but worsened in past few weeks.  hgb stable. Not symptomatic today. Suspecting hemorrhoids but patient refused a rectal exam today stating he didn't have time- promised to come back for this. - recommended again that he follow up with his surgeon  post ileostomy reversal.      [place]

## 2020-03-29 ENCOUNTER — Ambulatory Visit (INDEPENDENT_AMBULATORY_CARE_PROVIDER_SITE_OTHER): Payer: Medicare Other | Admitting: Student in an Organized Health Care Education/Training Program

## 2020-03-29 ENCOUNTER — Other Ambulatory Visit: Payer: Self-pay

## 2020-03-29 VITALS — BP 140/80 | HR 106 | Ht 68.0 in | Wt 249.0 lb

## 2020-03-29 DIAGNOSIS — K920 Hematemesis: Secondary | ICD-10-CM

## 2020-03-29 DIAGNOSIS — E1165 Type 2 diabetes mellitus with hyperglycemia: Secondary | ICD-10-CM | POA: Diagnosis not present

## 2020-03-29 DIAGNOSIS — K921 Melena: Secondary | ICD-10-CM | POA: Diagnosis not present

## 2020-03-29 DIAGNOSIS — Z23 Encounter for immunization: Secondary | ICD-10-CM | POA: Insufficient documentation

## 2020-03-29 DIAGNOSIS — Z794 Long term (current) use of insulin: Secondary | ICD-10-CM

## 2020-03-29 DIAGNOSIS — F319 Bipolar disorder, unspecified: Secondary | ICD-10-CM | POA: Diagnosis not present

## 2020-03-29 DIAGNOSIS — E119 Type 2 diabetes mellitus without complications: Secondary | ICD-10-CM | POA: Diagnosis not present

## 2020-03-29 MED ORDER — INSULIN PEN NEEDLE 32G X 6 MM MISC: 1.0000 | Freq: Two times a day (BID) | 4 refills | Status: DC

## 2020-03-29 NOTE — Assessment & Plan Note (Signed)
Resolved per patient - CBC today

## 2020-03-29 NOTE — Assessment & Plan Note (Addendum)
Continues to have elevated blood sugars.  - taking 10u lantus daily and has not followed instructions to increase for fear that it will be more painful - wife here today and was given instructions to increase by 2 units daily until morning blood sugar is >150. She and patient endorsed understanding - given education on site alternating for injections - will prescribe insulin needles that are smaller size if possible - urged him again to keep a log - follow up in 4 weeks with wife again - provided further counseling on the major consequences of long-term poor management of diabetes including dialysis, stroke, heart disease - continue jardiance

## 2020-03-29 NOTE — Assessment & Plan Note (Signed)
Resolved. Has stopped NSAID use Following up with GI next week

## 2020-03-29 NOTE — Patient Instructions (Signed)
It was a pleasure to see you today!  To summarize our discussion for this visit:  I'm happy to hear that you have been taking your insulin and checking your blood sugars. I will send in a prescription for smaller needles to cut down on the pain.  PLEASE BRING IN YOUR BLOOD SUGAR LOG TO REVIEW WITH ME  PLEASE increase your insulin dose by 2 units every day when your morning blood sugar is greater than 150. We will see if we need to increase more at our next appointment  We are checking some basic labs today  Follow up with your GI doctor and surgeon for your abdominal pain  i'm happy to hear that you are not using ibuprofen any more and have stopped having bleeding. We will check your hemoglobin today  I'm providing you with a list of local psychiatrists.  Some additional health maintenance measures we should update are: Health Maintenance Due  Topic Date Due  . OPHTHALMOLOGY EXAM  Never done  . COVID-19 Vaccine (1) Never done  . FOOT EXAM  09/11/2019  . URINE MICROALBUMIN  09/11/2019  .    Please return to our clinic to see me in 4 weeks.  Call the clinic at [phone] if your symptoms worsen or you have any concerns.   Thank you for allowing me to take part in your care,  Dr. Doristine Mango

## 2020-03-29 NOTE — Assessment & Plan Note (Signed)
Counseled on vaccine use and information

## 2020-03-29 NOTE — Assessment & Plan Note (Signed)
Provided with list of local psychiatrists and recommended to call insurance company for covered providers

## 2020-03-29 NOTE — Progress Notes (Signed)
    SUBJECTIVE:   CHIEF COMPLAINT / HPI: DM follow up  DM- patient endorses taking insulin daily and his jardiance. Has been taking 10u insulin daily. Has not been keeping a sugar log. Am blood sugars average around mid-200s and evening blood sugars average around 300 and have been as high as 600 still. His wife helps him manage his insulin and is here today to receive instructions and administration. He states that the insulin needles delivered by his mail in pharmacy are very large and painful, about 1 and a half inches long. Gives injections in his left arm only and changes locations each time.  Pain- chronic abdominal pain and multiple surgeries. Denies any more diarrhea, hematochezia or hematemesis. Has discontinued NSAID use. Uses tylenol and gabapentin now. Has appointment with GI doctor next week.  Mental health- patient sees a psychiatrist who he says will no longer refill his prescriptions so is asking for a list of new providers.   Teeth- patient has an appointment with dentist next week to have all teeth removed for dentures.   covid vaccine- patient would like information on covid vaccine as he is interested in getting it but is worried because he knows several people who have gotten the vaccine and died the next day.  OBJECTIVE:   BP 140/80   Pulse (!) 106   Ht $R'5\' 8"'Jm$  (1.727 m)   Wt 249 lb (112.9 kg)   SpO2 97%   BMI 37.86 kg/m   General: NAD, pleasant, able to participate in exam Extremities: no edema or cyanosis. WWP. Skin: warm and dry, no rashes noted Neuro: alert and oriented x4, no focal deficits Psych: Normal affect and mood  ASSESSMENT/PLAN:   Type 2 diabetes mellitus with hyperglycemia (HCC) Continues to have elevated blood sugars.  - taking 10u lantus daily and has not followed instructions to increase for fear that it will be more painful - wife here today and was given instructions to increase by 2 units daily until morning blood sugar is >150. She and  patient endorsed understanding - given education on site alternating for injections - will prescribe insulin needles that are smaller size if possible - urged him again to keep a log - follow up in 4 weeks with wife again - provided further counseling on the major consequences of long-term poor management of diabetes including dialysis, stroke, heart disease - continue jardiance  BIPOLAR DISORDER UNSPECIFIED Provided with list of local psychiatrists and recommended to call insurance company for covered providers  Hematochezia Resolved per patient - CBC today  Hematemesis Resolved. Has stopped NSAID use Following up with GI next week  High priority for COVID-19 virus vaccination Counseled on vaccine use and information     [name], [place]

## 2020-03-30 LAB — COMPREHENSIVE METABOLIC PANEL
ALT: 40 IU/L (ref 0–44)
AST: 40 IU/L (ref 0–40)
Albumin/Globulin Ratio: 1.3 (ref 1.2–2.2)
Albumin: 4.5 g/dL (ref 4.0–5.0)
Alkaline Phosphatase: 111 IU/L (ref 48–121)
BUN/Creatinine Ratio: 5 — ABNORMAL LOW (ref 9–20)
BUN: 5 mg/dL — ABNORMAL LOW (ref 6–20)
Bilirubin Total: <0.2 mg/dL (ref 0.0–1.2)
CO2: 23 mmol/L (ref 20–29)
Calcium: 9.5 mg/dL (ref 8.7–10.2)
Chloride: 98 mmol/L (ref 96–106)
Creatinine, Ser: 0.94 mg/dL (ref 0.76–1.27)
GFR calc Af Amer: 118 mL/min/{1.73_m2} (ref >59)
GFR calc non Af Amer: 102 mL/min/{1.73_m2} (ref >59)
Globulin, Total: 3.5 g/dL (ref 1.5–4.5)
Glucose: 283 mg/dL — ABNORMAL HIGH (ref 65–99)
Potassium: 4 mmol/L (ref 3.5–5.2)
Sodium: 138 mmol/L (ref 134–144)
Total Protein: 8 g/dL (ref 6.0–8.5)

## 2020-03-30 LAB — CBC
Hematocrit: 46.8 % (ref 37.5–51.0)
Hemoglobin: 15 g/dL (ref 13.0–17.7)
MCH: 25.6 pg — ABNORMAL LOW (ref 26.6–33.0)
MCHC: 32.1 g/dL (ref 31.5–35.7)
MCV: 80 fL (ref 79–97)
Platelets: 208 10*3/uL (ref 150–450)
RBC: 5.87 x10E6/uL — ABNORMAL HIGH (ref 4.14–5.80)
RDW: 17 % — ABNORMAL HIGH (ref 11.6–15.4)
WBC: 8.4 10*3/uL (ref 3.4–10.8)

## 2020-03-31 IMAGING — CR DG CHEST 2V
2 series · 2 of 2 positions shown · non-contrast
Comparison: Chest x-ray dated 04/26/2007 and abdominal radiographs
dated 11/19/2018

CLINICAL DATA: Severe chest pain.

EXAM:
CHEST - 2 VIEW

[chest pa]
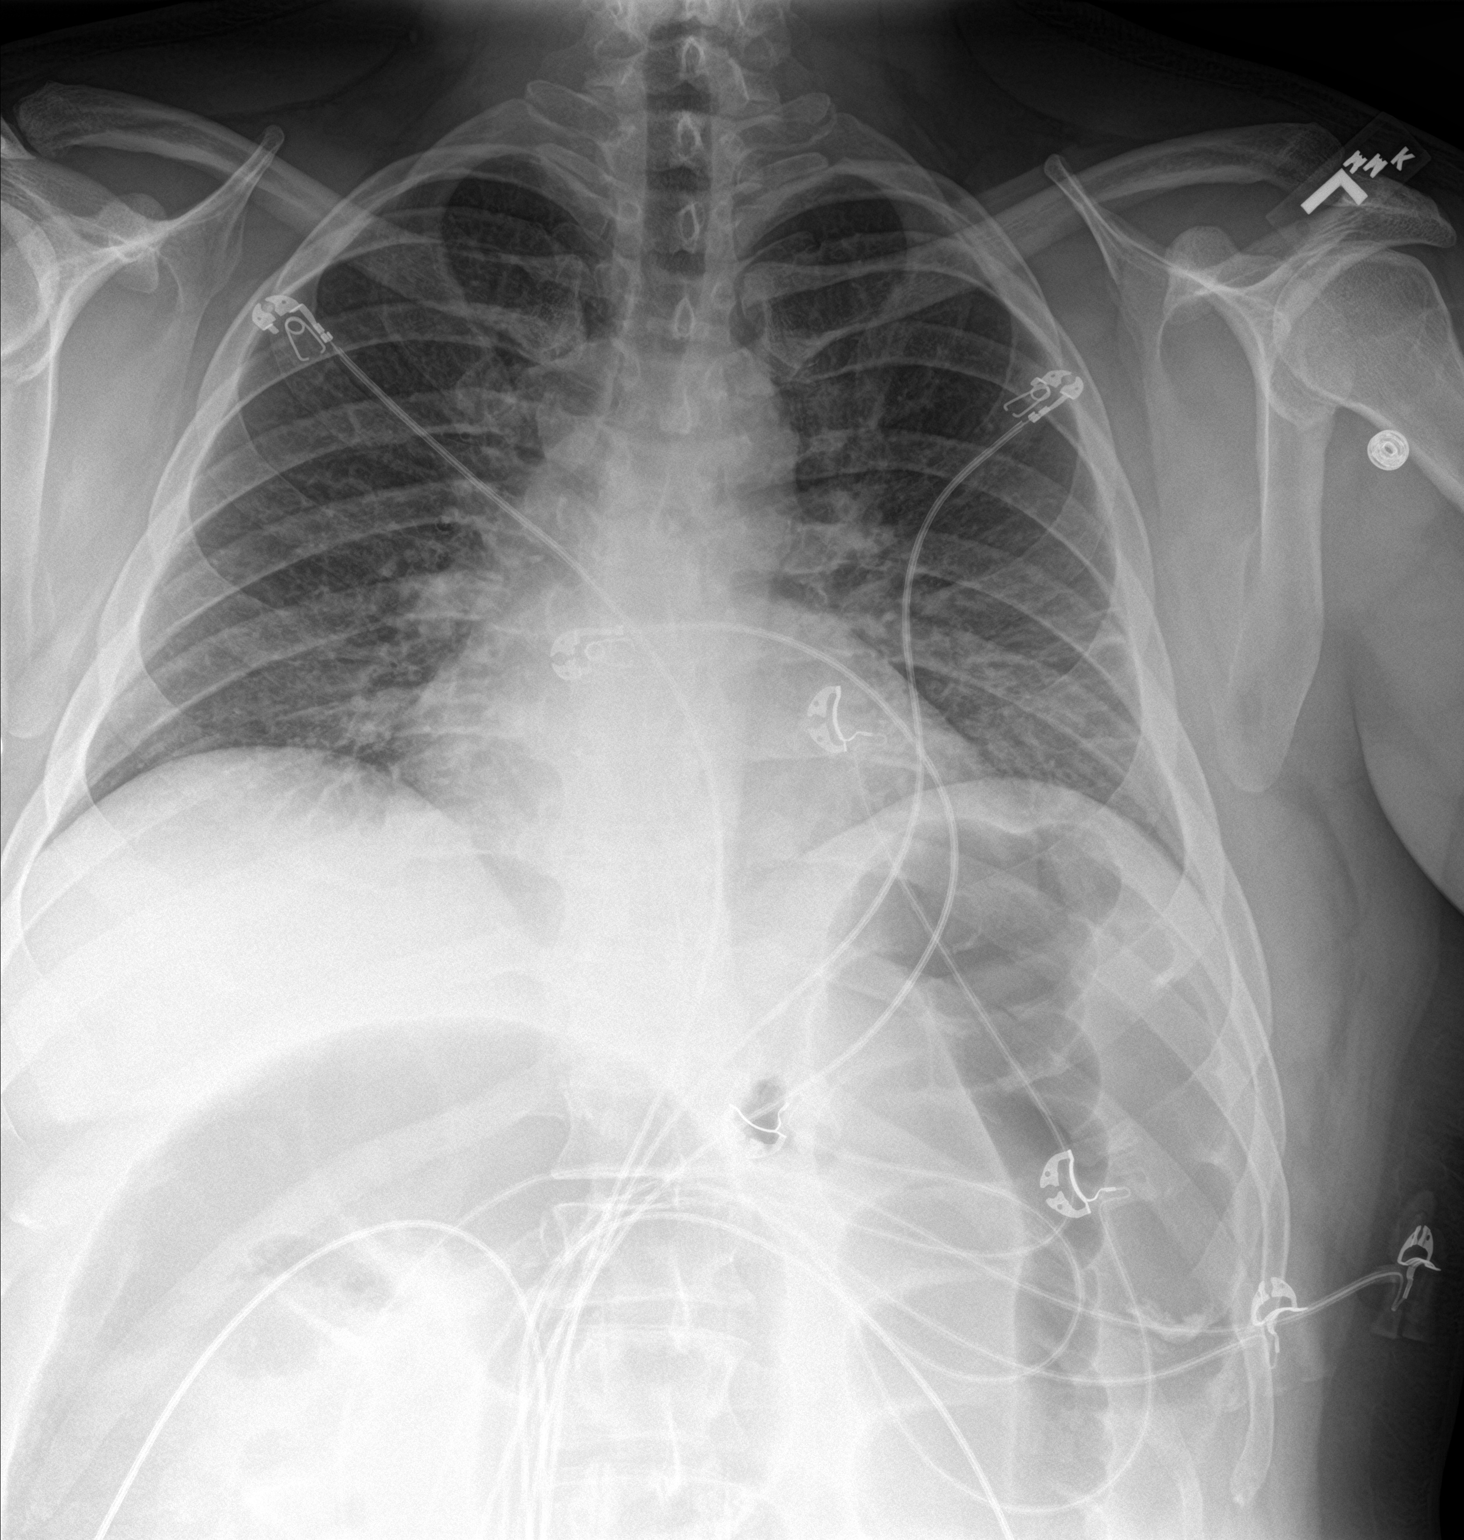

[chest lat]
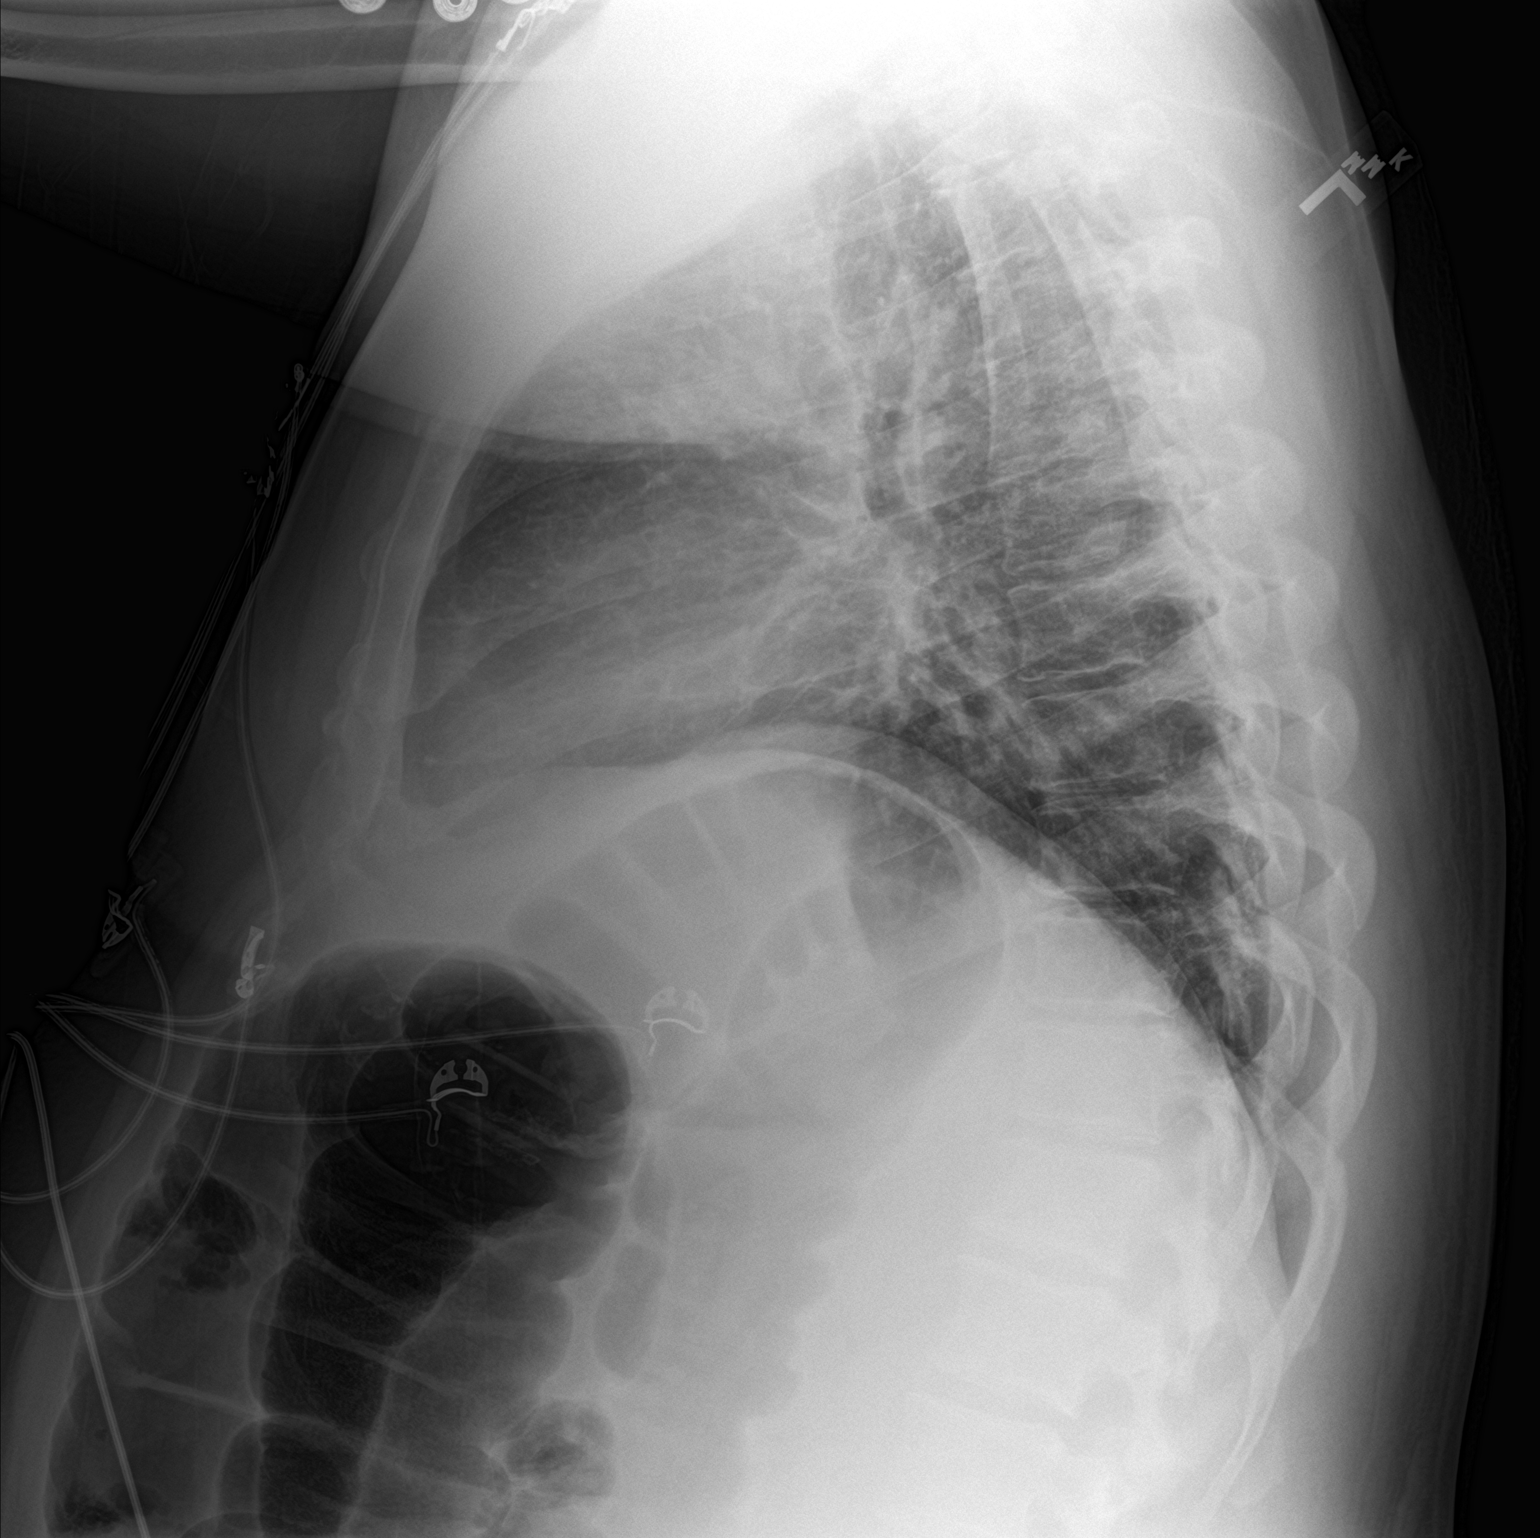

[2 of 2 positions shown; findings below may reference images not displayed]

FINDINGS: The heart size and pulmonary vascularity are normal. There is slight
atelectasis at the left lung base. No consolidative infiltrates or
effusions. No bone abnormality. Slight distention of the colon.
IMPRESSION: Slight atelectasis at the left lung base which may be due to a
shallow inspiration.

Increased gaseous distention of the colon since 11/19/2018.

## 2020-04-01 IMAGING — DX DG CHEST 1V PORT
1 series · 1 of 1 positions shown · non-contrast
Comparison: 11/22/2018

CLINICAL DATA: Chest pain

EXAM:
PORTABLE CHEST 1 VIEW

[chest ap]
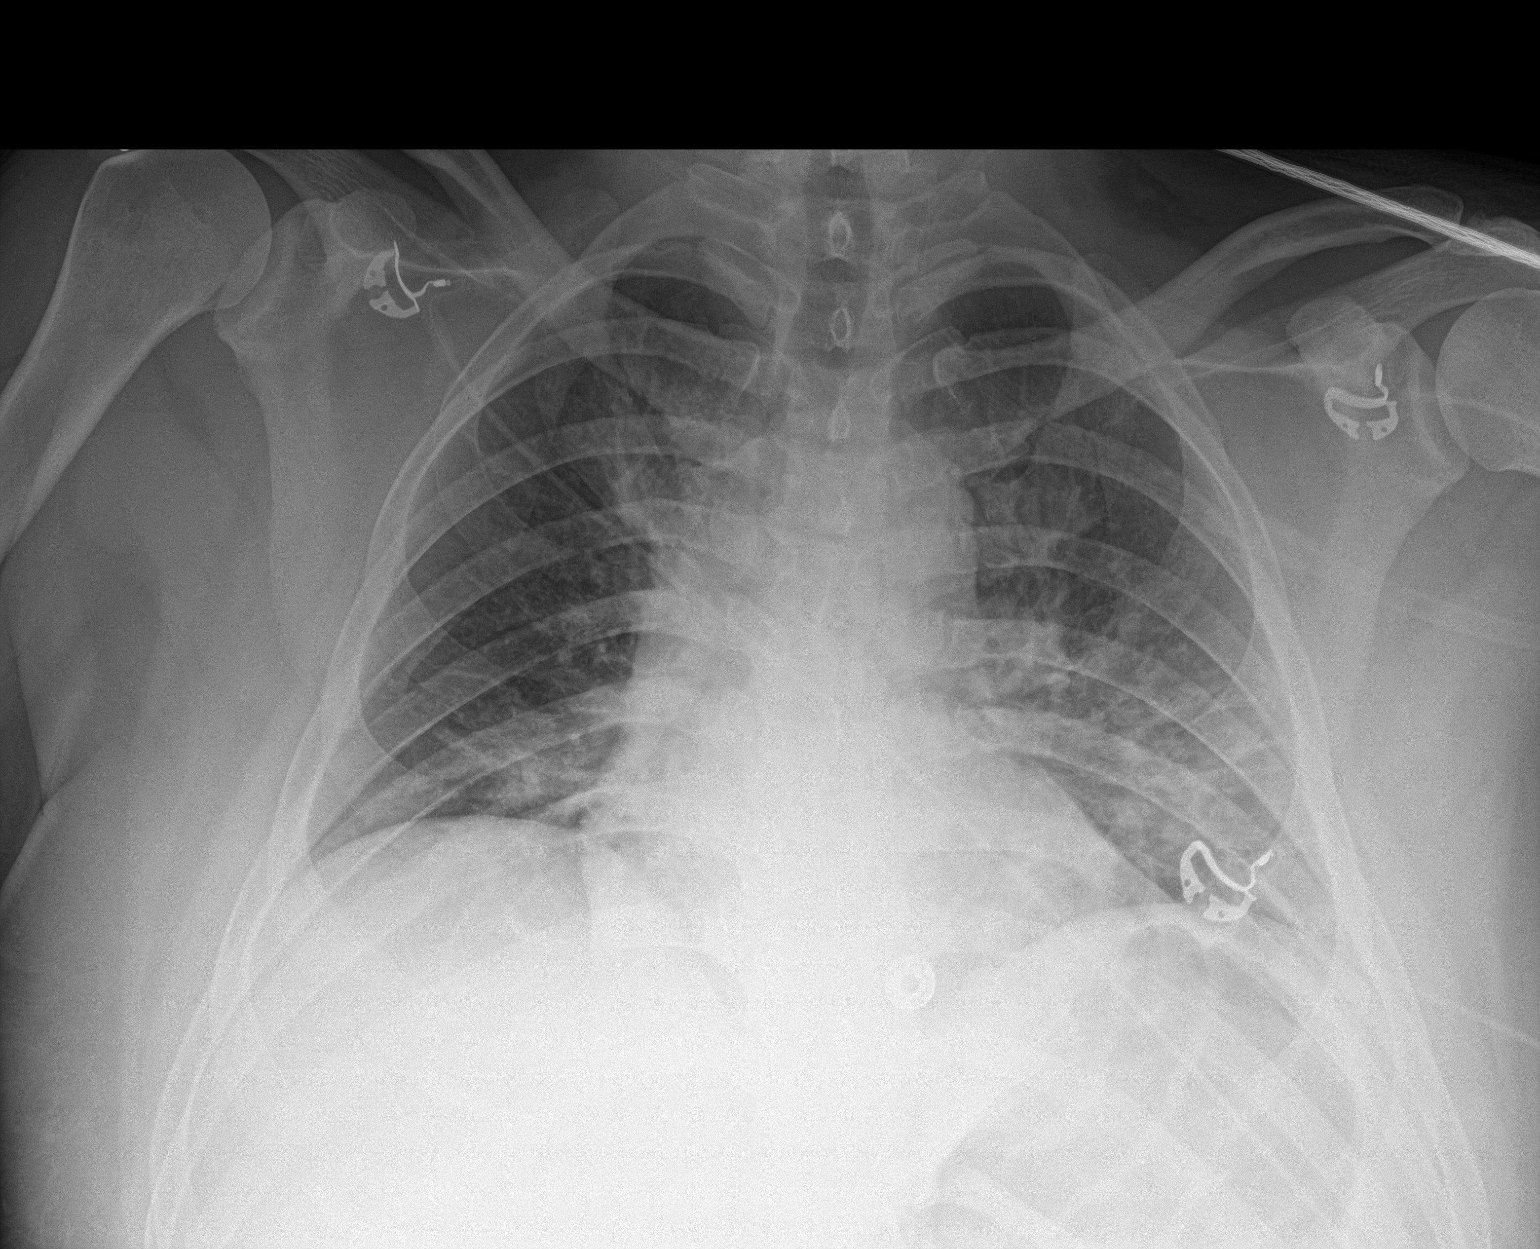

[1 of 1 positions shown; findings below may reference images not displayed]

FINDINGS: Bilateral interstitial thickening, left greater than right with a
lower lobe predominance. No pleural effusion or pneumothorax. Stable
cardiomediastinal silhouette. No acute osseous abnormality.
IMPRESSION: Bilateral interstitial thickening, left greater than right with a
lower lobe predominance. Differential considerations include mild
pulmonary edema versus multilobar pneumonia.

## 2020-04-05 ENCOUNTER — Other Ambulatory Visit: Payer: Self-pay | Admitting: *Deleted

## 2020-04-05 IMAGING — DX DG CHEST 2V
2 series · 2 of 2 positions shown · non-contrast
Comparison: Radiograph November 23, 2018.

CLINICAL DATA: Shortness of breath, leukocytosis.

EXAM:
CHEST - 2 VIEW

[chest lat]
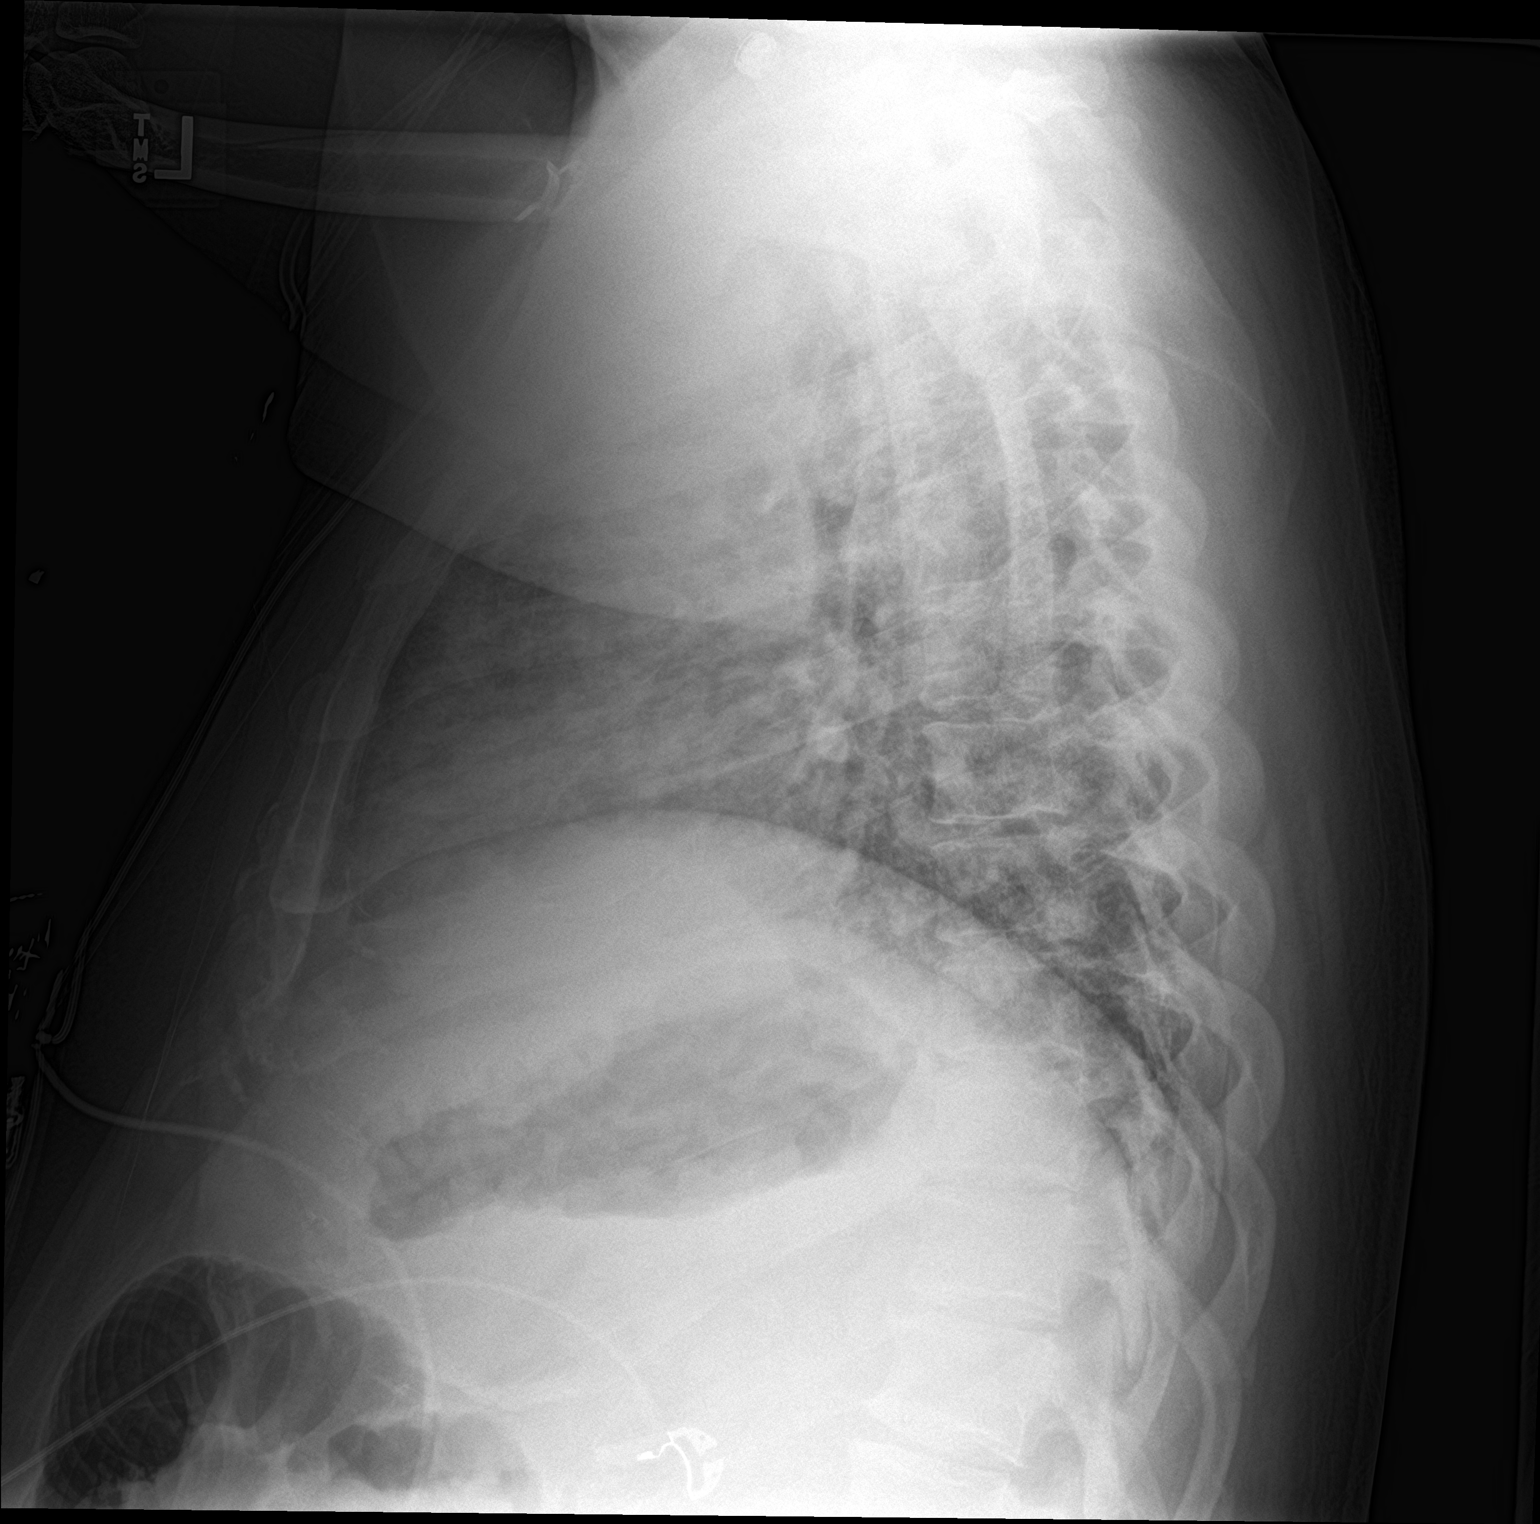

[chest ap]
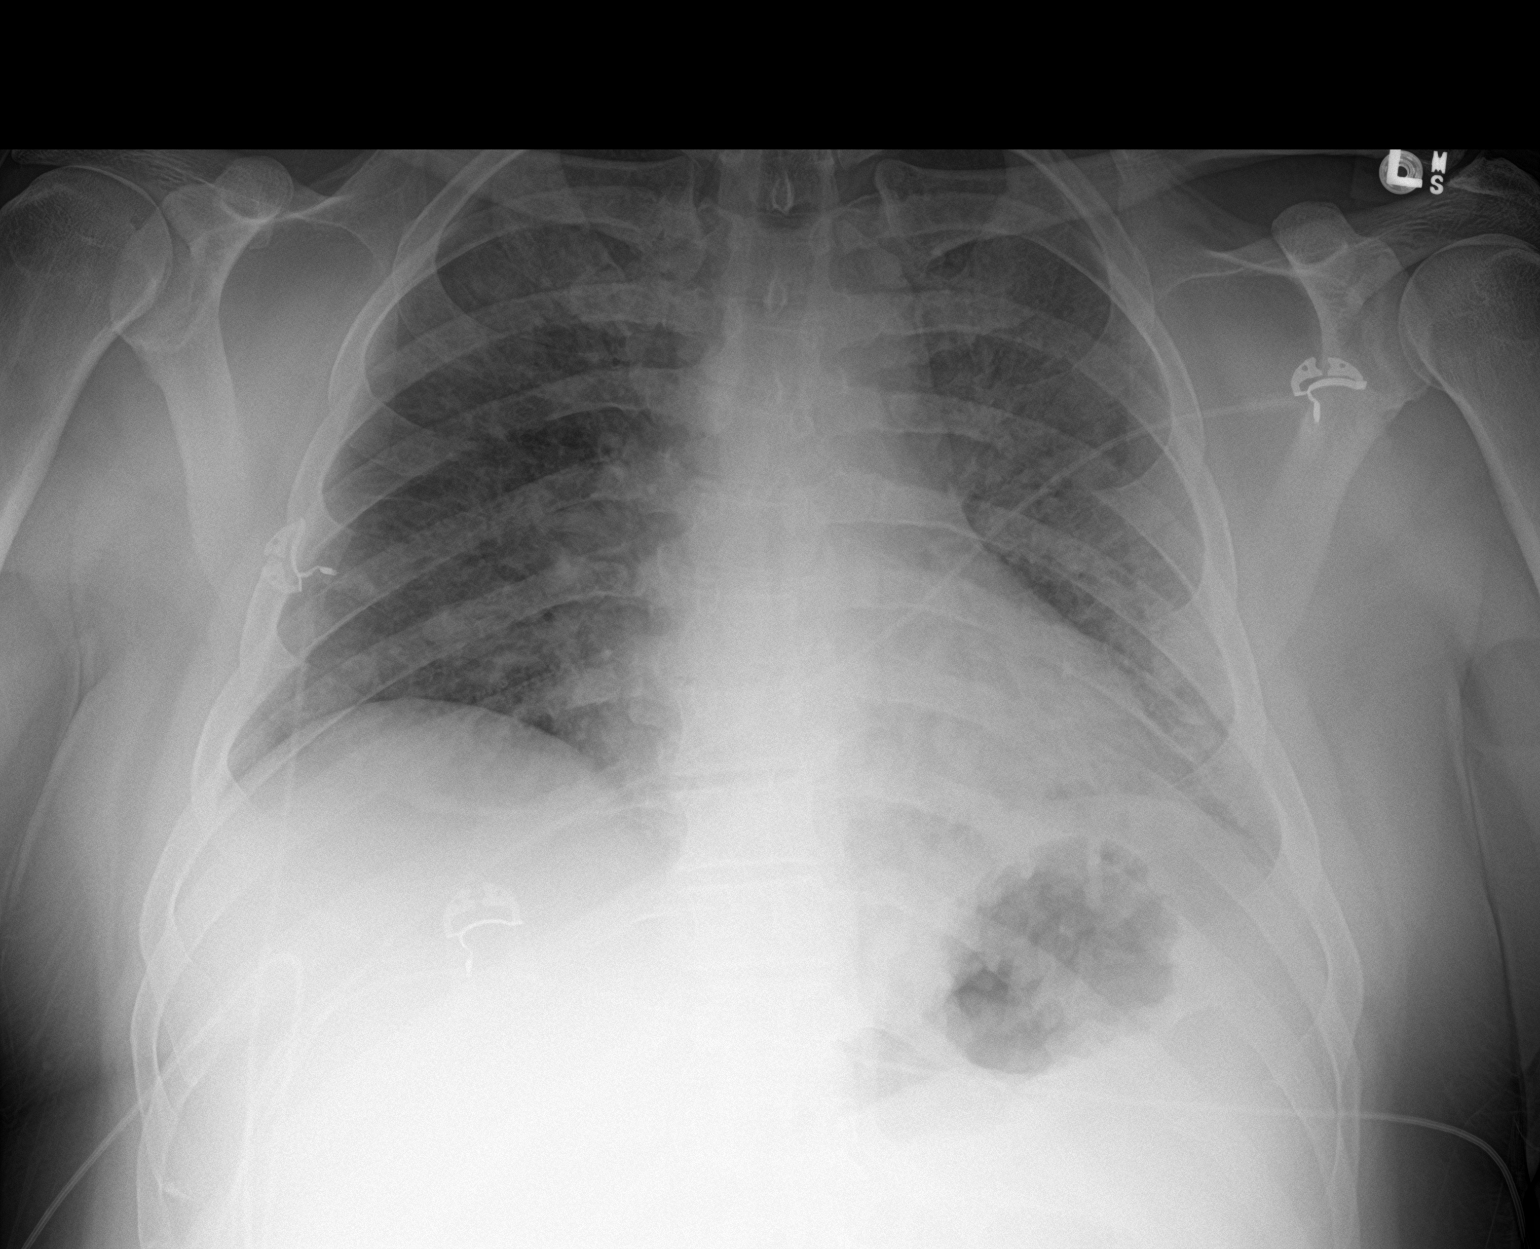

[2 of 2 positions shown; findings below may reference images not displayed]

FINDINGS: The heart size and mediastinal contours are within normal limits. No
pneumothorax or pleural effusion is noted. Increased bilateral
diffuse lung opacities are noted concerning for worsening pneumonia
or possibly edema. The visualized skeletal structures are
unremarkable.
IMPRESSION: Increased bilateral diffuse lung opacities are noted concerning for
worsening pneumonia or edema.

## 2020-04-06 ENCOUNTER — Ambulatory Visit: Payer: Medicare Other | Admitting: Gastroenterology

## 2020-04-06 MED ORDER — ONETOUCH ULTRA VI STRP: ORAL_STRIP | 3 refills | Status: DC

## 2020-05-08 ENCOUNTER — Ambulatory Visit (INDEPENDENT_AMBULATORY_CARE_PROVIDER_SITE_OTHER): Payer: Medicare Other | Admitting: Family Medicine

## 2020-05-08 ENCOUNTER — Other Ambulatory Visit: Payer: Self-pay

## 2020-05-08 VITALS — BP 112/76 | HR 107 | Ht 68.0 in | Wt 247.6 lb

## 2020-05-08 DIAGNOSIS — E119 Type 2 diabetes mellitus without complications: Secondary | ICD-10-CM | POA: Diagnosis not present

## 2020-05-08 DIAGNOSIS — E1165 Type 2 diabetes mellitus with hyperglycemia: Secondary | ICD-10-CM

## 2020-05-08 DIAGNOSIS — Z794 Long term (current) use of insulin: Secondary | ICD-10-CM | POA: Diagnosis not present

## 2020-05-08 LAB — GLUCOSE, POCT (MANUAL RESULT ENTRY): POC Glucose: 313 mg/dl — AB (ref 70–99)

## 2020-05-08 NOTE — Patient Instructions (Addendum)
[name],  It was great to meet you today. Well done for keeping the blood sugar diary, please keep this up!! Your blood sugars are very poorly controlled and this is concerning. Please take 20 UNITS LANTUS TOMORROW. Then increase by 2 units daily until your blood sugars are below <150. Follow up in 2 weeks with your PCP. Here you may want to discuss starting another diabetic medication.   Best wishes,  Dr Posey Pronto

## 2020-05-08 NOTE — Progress Notes (Signed)
    SUBJECTIVE:   CHIEF COMPLAINT / HPI:   [name] is a 39 year old man male who presents today for diabetes check. His wife was also present today.  Diabetes Diagnoses with diabetes a few years ago. Used to take metformin but has an allergy to it. Last A1c 10.1 in May 2021. He saw Dr Ouida Sills in clinic for uncontrolled hyperglycemia who recommended increasing lantus 10 units by 2 units until he achieved CBG<150. Unfortunately patient reports not understanding the instructions given to him. He is now taking 16 units. He brought his CBG diary in with him which shows fasting CBGs in 400-500. Usually checks CBGs twice a day. Tolerating Lantus and Jardiance well without side effects.  PERTINENT  PMH / PSH: Type 2 DM, GERD  OBJECTIVE:   BP 112/76   Pulse (!) 107   Ht $R'5\' 8"'CG$  (1.727 m)   Wt 247 lb 9.6 oz (112.3 kg)   SpO2 97%   BMI 37.65 kg/m   General: Alert and cooperative and appears to be in no acute distress Cardio: Well perfused  Pulm: Speaking in full sentences. Normal respiratory effort Neuro: Cranial nerves grossly intact  ASSESSMENT/PLAN:   Type 2 diabetes mellitus with hyperglycemia ([place]) Patient has persistently elevated CBGs (400s-500s and CBG in clinic today 313). Pt expressed poor understanding of his diabetes and consequences to uncontrolled hyperglycemia. I think this greatly affects his compliance and management of his Lantus. I recommend: -Increasing Lantus to 20 units today, then increasing by 2 units daily until CBGs <150. When he reaches goal of 150, he should stay at the same lantus dose -Continue Jardiance  -Continue CBG log -On going diabetic education at clinic visits -Follow up with PCP Dr Ouida Sills on 05/23/20 at 8:50 am.       Lattie Haw, MD [place]

## 2020-05-09 NOTE — Assessment & Plan Note (Addendum)
Patient has persistently elevated CBGs (400s-500s and CBG in clinic today 313). Pt expressed poor understanding of his diabetes and consequences to uncontrolled hyperglycemia. I think this greatly affects his compliance and management of his Lantus. I recommend: -Increasing Lantus to 20 units today, then increasing by 2 units daily until CBGs <150. When he reaches goal of 150, he should stay at the same lantus dose -Continue Jardiance  -Continue CBG log -On going diabetic education at clinic visits -Follow up with PCP Dr Ouida Sills on 05/23/20 at 8:50 am.

## 2020-05-23 ENCOUNTER — Ambulatory Visit: Payer: Medicare Other | Admitting: Student in an Organized Health Care Education/Training Program

## 2020-06-07 ENCOUNTER — Ambulatory Visit: Payer: Medicare Other | Admitting: Student in an Organized Health Care Education/Training Program

## 2020-06-11 ENCOUNTER — Other Ambulatory Visit: Payer: Self-pay

## 2020-06-11 DIAGNOSIS — K219 Gastro-esophageal reflux disease without esophagitis: Secondary | ICD-10-CM

## 2020-06-11 MED ORDER — OMEPRAZOLE 40 MG PO CPDR: 40.0000 mg | DELAYED_RELEASE_CAPSULE | Freq: Every day | ORAL | 0 refills | Status: DC

## 2020-06-24 ENCOUNTER — Ambulatory Visit (INDEPENDENT_AMBULATORY_CARE_PROVIDER_SITE_OTHER): Payer: Medicare Other

## 2020-06-24 ENCOUNTER — Other Ambulatory Visit: Payer: Self-pay

## 2020-06-24 ENCOUNTER — Ambulatory Visit
Admission: EM | Admit: 2020-06-24 | Discharge: 2020-06-24 | Disposition: A | Discharge status: Home / Self Care | Payer: Medicare Other | Attending: Emergency Medicine | Admitting: Emergency Medicine

## 2020-06-24 DIAGNOSIS — S4991XA Unspecified injury of right shoulder and upper arm, initial encounter: Secondary | ICD-10-CM

## 2020-06-24 DIAGNOSIS — S46911A Strain of unspecified muscle, fascia and tendon at shoulder and upper arm level, right arm, initial encounter: Secondary | ICD-10-CM

## 2020-06-24 DIAGNOSIS — M25511 Pain in right shoulder: Secondary | ICD-10-CM

## 2020-06-24 MED ORDER — CYCLOBENZAPRINE HCL 10 MG PO TABS
10.0000 mg | ORAL_TABLET | Freq: Every day | ORAL | 0 refills | Status: DC
Start: 2020-06-24 — End: 2020-06-27

## 2020-06-24 MED ORDER — PREDNISONE 20 MG PO TABS
20.0000 mg | ORAL_TABLET | Freq: Two times a day (BID) | ORAL | 0 refills | Status: AC
Start: 2020-06-24 — End: 2020-06-29

## 2020-06-24 NOTE — Discharge Instructions (Signed)
X-rays negative for fracture or dislocation, did show mild degenerative changes in the RT [facility] and glenohumeral joints Continue conservative management of rest, ice, and gentle stretches Sling given Prednisone prescribed.  Take as directed and to completion Take cyclobenzaprine at nighttime for symptomatic relief. Avoid driving or operating heavy machinery while using medication. Follow up with PCP if symptoms persist Return or go to the ER if you have any new or worsening symptoms (fever, chills, chest pain, redness, swelling, bruising, etc...)

## 2020-06-24 NOTE — ED Triage Notes (Signed)
Pt presents with right shoulder injury from last week while in wrestling ring, asymmetry noted

## 2020-06-24 NOTE — ED Provider Notes (Signed)
[facility]   [id number] 06/24/20 Arrival Time: 0935  CC: RT shoulder pain  SUBJECTIVE: History from: patient. FUE CERVENKA is a 39 y.o. male complains of RT shoulder pain x 1 weeks.  Injury from wrestling.  Localizes the pain to the RT shoulder.  Describes the pain as constant and sharp in character.  Has tried OTC medications without relief.  Symptoms are made worse with ROM.  Reports LT shoulder dislocation.  Complains of associated numbness/ tingling.  Denies fever, chills, erythema, ecchymosis, effusion, weakness.     ROS: As per HPI.  All other pertinent ROS negative.     Past Medical History:  Diagnosis Date  . Allergy   . Anxiety   . 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 Bipolar disorder ([place])    On disability  . Depression   . Diabetes mellitus without complication ([place])    per patient : under control with diet, does not monitor cbg at home   . Headache(784.0)    daily headaches  . Hyperlipidemia   . Neuromuscular disorder ([place])    Beckers muscular dystrophy  . Obesity   . Sleep apnea    Does not tolerate CPAP   Past Surgical History:  Procedure Laterality Date  . APPLICATION OF WOUND VAC N/A 11/23/2018   Procedure: Application Of Wound Vac;  Surgeon: Kinsinger, Arta Bruce, MD;  Location: [facility];  Service: General;  Laterality: N/A;  . COLECTOMY N/A 11/23/2018   Procedure: TOTAL COLECTOMY;  Surgeon: Kieth Brightly Arta Bruce, MD;  Location: [facility];  Service: General;  Laterality: N/A;  . ILEOSTOMY N/A 11/23/2018   Procedure: ILEOSTOMY;  Surgeon: Kinsinger, Arta Bruce, MD;  Location: [facility];  Service: General;  Laterality: N/A;  . ILEOSTOMY CLOSURE N/A 05/19/2019   Procedure: ILEOSTOMY REVERSAL WITH ILEOCOLIC AND COLORECTAL ANASTOMOSIS;  Surgeon: Kinsinger, Arta Bruce, MD;  Location: [facility];  Service: General;  Laterality: N/A;  . NM MYOCAR PERF WALL MOTION  01/22/2011   protocol: Persantine, moderate reversible inferior defect post stress EF 48%, high risk scan  .  TRANSTHORACIC ECHOCARDIOGRAM  07/05/2004   EF=>55% normal study    Allergies  Allergen Reactions  . Metformin And Related Other (See Comments)    Stomach cramps,  Throat closing sensation, could not swallow  . Penicillins Anaphylaxis, Hives, Shortness Of Breath and Swelling    Did it involve swelling of the face/tongue/throat, SOB, or low BP? Yes Did it involve sudden or severe rash/hives, skin peeling, or any reaction on the inside of your mouth or nose? Unk Did you need to seek medical attention at a hospital or doctor's office? No When did it last happen?Unk If all above answers are "NO", may proceed with cephalosporin use. Got ceftriaxone before    No current facility-administered medications on file prior to encounter.   Current Outpatient Medications on File Prior to Encounter  Medication Sig Dispense Refill  . atorvastatin (LIPITOR) 40 MG tablet Take 1 tablet (40 mg total) by mouth daily. 90 tablet 1  . Blood Glucose Monitoring Suppl (ONE TOUCH ULTRA 2) w/Device KIT USE TWO TIMES A DAY 1 kit 0  . dicyclomine (BENTYL) 10 MG capsule Take 1 capsule (10 mg total) by mouth 4 (four) times daily -  before meals and at bedtime. 30 capsule 0  . diphenhydramine-acetaminophen (TYLENOL PM) 25-500 MG TABS tablet Take 3 tablets by mouth at bedtime.    . empagliflozin (JARDIANCE) 25 MG TABS tablet Take 25 mg by mouth daily. 90 tablet  3  . gabapentin (NEURONTIN) 300 MG capsule Take 300 mg by mouth daily.   1  . glucose blood (ONETOUCH ULTRA) test strip USE TO TEST TWO TIMES A DAY 100 strip 3  . insulin glargine (LANTUS) 100 UNIT/ML Solostar Pen Inject 40 Units into the skin every morning. Start at 10 units daily then increase by 1 unit daily. 3 mL 11  . Insulin Pen Needle 32G X 6 MM MISC 1 packet by Does not apply route 2 (two) times daily. 1 each 4  . Lancets (ONETOUCH DELICA PLUS [id number]) MISC USE TO TEST TWO TIMES A DAY 100 each 0  . LATUDA 120 MG TABS Take 1 tablet by mouth at bedtime.     Marland Kitchen omeprazole (PRILOSEC) 40 MG capsule Take 1 capsule (40 mg total) by mouth at bedtime. 30 capsule 0  . propranolol (INDERAL) 40 MG tablet Take 40 mg by mouth 2 (two) times daily.    . traZODone (DESYREL) 100 MG tablet Take 100 mg by mouth at bedtime.    Marland Kitchen VYVANSE 20 MG capsule Take 20 mg by mouth daily with breakfast.     . VYVANSE 50 MG capsule Take 50 mg by mouth daily with breakfast.     . [DISCONTINUED] PROAIR HFA 108 (90 Base) MCG/ACT inhaler INHALE 2 PUFFS BY MOUTH EVERY 6 HOURS AS NEEDED FOR WHEEZE OR SHORTNESS OF BREATH 8.5 g 0   Social History   Socioeconomic History  . Marital status: Single    Spouse name: Not on file  . Number of children: 2  . Years of education: 52  . Highest education level: Not on file  Occupational History    Employer: UNEMPLOYED  Tobacco Use  . Smoking status: Current Every Day Smoker    Packs/day: 2.00    Years: 21.00    Pack years: 42.00    Types: Cigarettes  . Smokeless tobacco: Never Used  Vaping Use  . Vaping Use: Former  . Devices: Increased nicotine cravings  Substance and Sexual Activity  . Alcohol use: No    Alcohol/week: 0.0 standard drinks    Comment: quit 2013  . Drug use: Yes    Types: Marijuana    Comment: 7-27  . Sexual activity: Yes    Birth control/protection: None  Other Topics Concern  . Not on file  Social History Narrative   Patient lives at home with parents, brother and brothers wife.    Patient is single.    Patient has 2 children.    Patient has a high school education.    Patient is unemployed.    Patient is right handed.    Social Determinants of Health   Financial Resource Strain:   . Difficulty of Paying Living Expenses: Not on file  Food Insecurity:   . Worried About Charity fundraiser in the Last Year: Not on file  . Ran Out of Food in the Last Year: Not on file  Transportation Needs:   . Lack of Transportation (Medical): Not on file  . Lack of Transportation (Non-Medical): Not on file    Physical Activity:   . Days of Exercise per Week: Not on file  . Minutes of Exercise per Session: Not on file  Stress:   . Feeling of Stress : Not on file  Social Connections:   . Frequency of Communication with Friends and Family: Not on file  . Frequency of Social Gatherings with Friends and Family: Not on file  . Attends Religious Services:  Not on file  . Active Member of Clubs or Organizations: Not on file  . Attends Banker Meetings: Not on file  . Marital Status: Not on file  Intimate Partner Violence:   . Fear of Current or Ex-Partner: Not on file  . Emotionally Abused: Not on file  . Physically Abused: Not on file  . Sexually Abused: Not on file   Family History  Problem Relation Age of Onset  . COPD Mother   . Depression Mother   . Diabetes Mother   . Hyperlipidemia Mother   . Asthma Father   . Arthritis Father   . Diabetes Father   . Heart disease Father   . Hyperlipidemia Father   . Hypertension Father   . Hypertension Brother     OBJECTIVE:  Vitals:   06/24/20 0946  BP: (!) 145/94  Pulse: 88  Resp: 20  Temp: 98.7 F (37.1 C)  SpO2: 96%    General appearance: ALERT; in no acute distress.  Head: NCAT Lungs: Normal respiratory effort; CTAB CV: RRR Musculoskeletal: RT shoulder Inspection: Skin warm, dry, clear and intact without obvious erythema, effusion, or ecchymosis. Tattoos present Palpation: diffusely TTP over anterior, lateral, and posterior shoulder ROM: FROM active and passive Strength: 5/5 shld abduction, 5/5 shld adduction, 5/5 elbow flexion, 5/5 elbow extension, 5/5 grip strength Skin: warm and dry Neurologic: Ambulates without difficulty; Sensation intact about the upper extremities Psychological: alert and cooperative; normal mood and affect  DIAGNOSTIC STUDIES:  DG Shoulder Right  Result Date: 06/24/2020 CLINICAL DATA:  Right shoulder pain after wrestling injury 1 week ago. EXAM: RIGHT SHOULDER - 2+ VIEW COMPARISON:   Right shoulder radiographs- FINDINGS: No fracture or dislocation. Mild degenerative change of the glenohumeral and acromioclavicular joints with joint space loss and inferiorly directed osteophytosis. No evidence of calcific tendinitis. Limited visualization of the adjacent thorax is normal. Regional soft tissues appear normal. IMPRESSION: 1. No acute findings. 2. Mild degenerative change of the right AC and glenohumeral joints. Electronically Signed   By: Simonne Come M.D.   On: 06/24/2020 09:59    X-rays negative for bony abnormalities including fracture, or dislocation.  No soft tissue swelling.    I have reviewed the x-rays myself and the radiologist interpretation. I am in agreement with the radiologist interpretation.     ASSESSMENT & PLAN:  1. Acute pain of right shoulder   2. Right shoulder strain, initial encounter     Meds ordered this encounter  Medications  . predniSONE (DELTASONE) 20 MG tablet    Sig: Take 1 tablet (20 mg total) by mouth 2 (two) times daily with a meal for 5 days.    Dispense:  10 tablet    Refill:  0    Order Specific Question:   Supervising Provider    Answer:   Eustace Moore [[id number]]  . cyclobenzaprine (FLEXERIL) 10 MG tablet    Sig: Take 1 tablet (10 mg total) by mouth at bedtime.    Dispense:  15 tablet    Refill:  0    Order Specific Question:   Supervising Provider    Answer:   Eustace Moore [[id number]]   X-rays negative for fracture or dislocation, did show mild degenerative changes in the RT [facility] and glenohumeral joints Continue conservative management of rest, ice, and gentle stretches Sling given Prednisone prescribed.  Take as directed and to completion Take cyclobenzaprine at nighttime for symptomatic relief. Avoid driving or operating heavy machinery while using medication. Follow  up with PCP if symptoms persist Return or go to the ER if you have any new or worsening symptoms (fever, chills, chest pain, redness, swelling, bruising,  etc...)    Reviewed expectations re: course of current medical issues. Questions answered. Outlined signs and symptoms indicating need for more acute intervention. Patient verbalized understanding. After Visit Summary given.    Lestine Box, PA-C 06/24/20 1007

## 2020-06-26 ENCOUNTER — Ambulatory Visit (INDEPENDENT_AMBULATORY_CARE_PROVIDER_SITE_OTHER): Payer: Medicare Other | Admitting: Student in an Organized Health Care Education/Training Program

## 2020-06-26 ENCOUNTER — Other Ambulatory Visit: Payer: Self-pay

## 2020-06-26 VITALS — BP 134/76 | HR 105 | Ht 68.0 in | Wt 248.2 lb

## 2020-06-26 DIAGNOSIS — S4990XD Unspecified injury of shoulder and upper arm, unspecified arm, subsequent encounter: Secondary | ICD-10-CM | POA: Diagnosis not present

## 2020-06-26 DIAGNOSIS — Z794 Long term (current) use of insulin: Secondary | ICD-10-CM

## 2020-06-26 DIAGNOSIS — M542 Cervicalgia: Secondary | ICD-10-CM | POA: Diagnosis not present

## 2020-06-26 DIAGNOSIS — E119 Type 2 diabetes mellitus without complications: Secondary | ICD-10-CM

## 2020-06-26 DIAGNOSIS — K219 Gastro-esophageal reflux disease without esophagitis: Secondary | ICD-10-CM | POA: Diagnosis not present

## 2020-06-26 DIAGNOSIS — E1165 Type 2 diabetes mellitus with hyperglycemia: Secondary | ICD-10-CM

## 2020-06-26 LAB — POCT GLYCOSYLATED HEMOGLOBIN (HGB A1C): HbA1c, POC (controlled diabetic range): 11.8 % — AB (ref 0.0–7.0)

## 2020-06-26 MED ORDER — TRAMADOL HCL 50 MG PO TABS: 50.0000 mg | ORAL_TABLET | Freq: Three times a day (TID) | ORAL | 0 refills | Status: AC | PRN

## 2020-06-26 MED ORDER — OMEPRAZOLE 20 MG PO CPDR: 40.0000 mg | DELAYED_RELEASE_CAPSULE | Freq: Every day | ORAL | 1 refills | Status: DC

## 2020-06-26 NOTE — Progress Notes (Signed)
    SUBJECTIVE:   CHIEF COMPLAINT / HPI: DM f/u  Acute injury- Sustained shoulder injury from wrestling and was treated at UC/ED on 9/1, 9/5. Given steroids and muscle relaxers from UC. 3 days left of steroids. Tylenol not working.Takes gabapentin from psychiatrist. Helps with headache but not other pain.  GERD- Requesting Prilosec refill. GERD symptoms chronic and unchanged.  DM- CBG 479 this morning. Jaurdiance and 26u insulin today. Was up to 36u insulin but was burning the injection site so decreased. The lower dose did not improve his pain. Has been giving the injection in the same place in arm every time.   Got flu shot. Also fully covid vaccinated.   OBJECTIVE:   BP 134/76   Pulse (!) 105   Ht $R'5\' 8"'Gs$  (1.727 m)   Wt 248 lb 3.2 oz (112.6 kg)   SpO2 96%   BMI 37.74 kg/m   General: NAD, pleasant, able to participate in exam Shoulder: decreased R strength and ROM due to pain. Tender to palpation diffusely around musculature of joint. No deformity present. Extremities: no edema or cyanosis. WWP. No injection site reactions present where patient uses insulin Skin: warm and dry, no rashes noted Neuro: alert and oriented x4, no focal deficits Psych: Normal affect and mood  ASSESSMENT/PLAN:   Shoulder injury, subsequent encounter Referred patient to sports medicine.  Given 1 week supply tramadol and informed this is not a long-term medication. Patient expressed understanding. Continue taking tylenol, muscle relaxer and avoid NSAIDs Can discontinue steroid.   Type 2 diabetes mellitus with hyperglycemia (H[facility]) Poorly controlled. Likely exacerbated by the PO steroids from the UC and non-adherence with his prescribed medications. Again, spent considerable amount of time re-educating patient on insulin use.  Recommended he continue with his 36u of insulin daily and titrate up by 2u daily once he has discontinued his steroids to a goal of <150 fasting blood sugars. A1c today is 11.8  from 10.5 at last check.  GERD (gastroesophageal reflux disease) Refilled omeprazole for $RemoveBefore'20mg'[id number]$  daily in attempt to decrease dose.  Informed patient that he should monitor for symptoms at lower dose.  He will need to follow up with GI for complex abdominal surgeries and contributions to his symptoms     [place]

## 2020-06-26 NOTE — Patient Instructions (Signed)
It was a pleasure to see you today!  To summarize our discussion for this visit:  I am sorry to hear about your shoulder pain.  I am prescribing a few days of tramadol to help with your pain.  I am also referring you to sports medicine to be treated further.  I am happy to hear that you got your Covid vaccine and your flu vaccine.  Your A1c today is 11.8 which is a great improvement from our last check.  Congratulations on all your hard work.  Please continue to take your insulin at 36 units daily until you have completed your steroids from the emergency department because those medications can cause an increase in your blood sugars.  I would still recommend you increase your insulin dose by a couple of units per day until you notice that your morning blood sugars are less than 150.  Come back to see me in about 1 month   Call the clinic at [phone] if your symptoms worsen or you have any concerns.   Thank you for allowing me to take part in your care,  Dr. Doristine Mango

## 2020-06-27 ENCOUNTER — Ambulatory Visit
Admission: EM | Admit: 2020-06-27 | Discharge: 2020-06-27 | Disposition: A | Discharge status: Home / Self Care | Payer: Medicare Other | Attending: Emergency Medicine | Admitting: Emergency Medicine

## 2020-06-27 ENCOUNTER — Encounter: Payer: Self-pay | Admitting: Emergency Medicine

## 2020-06-27 DIAGNOSIS — M25511 Pain in right shoulder: Secondary | ICD-10-CM

## 2020-06-27 MED ORDER — CELECOXIB 100 MG PO CAPS: 100.0000 mg | ORAL_CAPSULE | Freq: Two times a day (BID) | ORAL | 0 refills | Status: DC

## 2020-06-27 MED ORDER — ONDANSETRON 4 MG PO TBDP: 4.0000 mg | ORAL_TABLET | Freq: Three times a day (TID) | ORAL | 0 refills | Status: DC | PRN

## 2020-06-27 MED ORDER — TIZANIDINE HCL 4 MG PO TABS: 4.0000 mg | ORAL_TABLET | Freq: Four times a day (QID) | ORAL | 0 refills | Status: DC | PRN

## 2020-06-27 NOTE — ED Provider Notes (Signed)
[facility]    CSN: [id number] Arrival date & time: 06/27/20  1415      History   Chief Complaint Chief Complaint  Patient presents with  . Shoulder Pain    HPI [name] is a 39 y.o. male history of DM type II, GERD, tobacco use, presenting today for evaluation of shoulder pain.  Patient reports he has had chronic right shoulder pain over the past few weeks.  Denies any recent injury or increased activity.  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  Recently was placed on a course of steroids which cause sugars to spike and was taken off.  Provided tramadol which did ease off pain slightly.  Had imaging a few days ago showing mild degenerative changes at [facility] joint, otherwise unremarkable.  Has history of prior colon surgery and tries to avoid NSAIDs if possible.  Pain has been keeping him up at night.  Reports pain radiating into neck.  Reports history of similar needing physical therapy.    HPI  Past Medical History:  Diagnosis Date  . Allergy   . Anxiety   . 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 Bipolar disorder ([place])    On disability  . Depression   . Diabetes mellitus without complication ([place])    per patient : under control with diet, does not monitor cbg at home   . Headache(784.0)    daily headaches  . Hyperlipidemia   . Neuromuscular disorder ([place])    Beckers muscular dystrophy  . Obesity   . Sleep apnea    Does not tolerate CPAP    Patient Active Problem List   Diagnosis Date Noted  . High priority for COVID-19 virus vaccination 03/29/2020  . Type 2 diabetes mellitus with hyperglycemia ([place]) 03/01/2020  . Abdominal pain 11/22/2018  . Memory difficulty 10/04/2018  . Erectile dysfunction 01/29/2018  . Neck pain 11/12/2016  . Back pain 10/25/2016  . Allergic rhinitis 04/28/2016  . GERD (gastroesophageal reflux disease) 03/21/2013  . Elevated BP 04/25/2012  . Narcotic abuse ([place]) 06/05/2011  . OBESITY 10/17/2010  .  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 SLEEP APNEA 01/24/2009  . BIPOLAR DISORDER UNSPECIFIED 05/05/2008  . HYPERLIPIDEMIA 03/30/2008  . ABUSE, ALCOHOL, UNSPECIFIED 04/26/2007  . TOBACCO ABUSE 12/25/2006    Past Surgical History:  Procedure Laterality Date  . APPLICATION OF WOUND VAC N/A 11/23/2018   Procedure: Application Of Wound Vac;  Surgeon: Kinsinger, Arta Bruce, MD;  Location: [place];  Service: General;  Laterality: N/A;  . COLECTOMY N/A 11/23/2018   Procedure: TOTAL COLECTOMY;  Surgeon: Kieth Brightly Arta Bruce, MD;  Location: [place];  Service: General;  Laterality: N/A;  . ILEOSTOMY N/A 11/23/2018   Procedure: ILEOSTOMY;  Surgeon: Kinsinger, Arta Bruce, MD;  Location: [place];  Service: General;  Laterality: N/A;  . ILEOSTOMY CLOSURE N/A 05/19/2019   Procedure: ILEOSTOMY REVERSAL WITH ILEOCOLIC AND COLORECTAL ANASTOMOSIS;  Surgeon: Kinsinger, Arta Bruce, MD;  Location: [facility];  Service: General;  Laterality: N/A;  . NM MYOCAR PERF WALL MOTION  01/22/2011   protocol: Persantine, moderate reversible inferior defect post stress EF 48%, high risk scan  . TRANSTHORACIC ECHOCARDIOGRAM  07/05/2004   EF=>55% normal study        Home Medications    Prior to Admission medications   Medication Sig Start Date End Date Taking? Authorizing Provider  atorvastatin (LIPITOR) 40 MG tablet Take 1 tablet (40 mg total) by mouth daily. 08/29/19   Doristine Mango L, DO  Blood Glucose Monitoring Suppl (ONE TOUCH ULTRA 2) w/Device KIT USE TWO TIMES A DAY 12/21/19   Doristine Mango L, DO  celecoxib (CELEBREX) 100 MG capsule Take 1 capsule (100 mg total) by mouth 2 (two) times daily. 06/27/20   Martinez Boxx C, PA-C  dicyclomine (BENTYL) 10 MG capsule Take 1 capsule (10 mg total) by mouth 4 (four) times daily -  before meals and at bedtime. 10/06/19   Kathrene Alu, MD  diphenhydramine-acetaminophen (TYLENOL PM) 25-500 MG TABS tablet Take 3 tablets by mouth at bedtime.    [provider]  empagliflozin  (JARDIANCE) 25 MG TABS tablet Take 25 mg by mouth daily. 12/21/19   Anderson, Chelsey L, DO  gabapentin (NEURONTIN) 300 MG capsule Take 300 mg by mouth daily.  06/20/16   [provider]  glucose blood (ONETOUCH ULTRA) test strip USE TO TEST TWO TIMES A DAY 04/06/20   Anderson, Chelsey L, DO  insulin glargine (LANTUS) 100 UNIT/ML Solostar Pen Inject 40 Units into the skin every morning. Start at 10 units daily then increase by 1 unit daily. 12/27/19   Zenia Resides, MD  Insulin Pen Needle 32G X 6 MM MISC 1 packet by Does not apply route 2 (two) times daily. 03/29/20   Anderson, Chelsey L, DO  Lancets (ONETOUCH DELICA PLUS IRCVEL38B) MISC USE TO TEST TWO TIMES A DAY 12/21/19   Anderson, Chelsey L, DO  LATUDA 120 MG TABS Take 1 tablet by mouth at bedtime. 12/07/19   [provider]  omeprazole (PRILOSEC) 20 MG capsule Take 2 capsules (40 mg total) by mouth at bedtime. 06/26/20   Anderson, Chelsey L, DO  ondansetron (ZOFRAN ODT) 4 MG disintegrating tablet Take 1 tablet (4 mg total) by mouth every 8 (eight) hours as needed for nausea or vomiting. 06/27/20   Chayim Bialas C, PA-C  predniSONE (DELTASONE) 20 MG tablet Take 1 tablet (20 mg total) by mouth 2 (two) times daily with a meal for 5 days. 06/24/20 06/29/20  Wurst, Marye Round, PA-C  propranolol (INDERAL) 40 MG tablet Take 40 mg by mouth 2 (two) times daily. 12/05/19   [provider]  tiZANidine (ZANAFLEX) 4 MG tablet Take 1 tablet (4 mg total) by mouth every 6 (six) hours as needed for muscle spasms. 06/27/20   Angeliah Wisdom C, PA-C  traMADol (ULTRAM) 50 MG tablet Take 1 tablet (50 mg total) by mouth every 8 (eight) hours as needed for up to 5 days. 06/26/20 07/01/20  Anderson, Chelsey L, DO  traZODone (DESYREL) 100 MG tablet Take 100 mg by mouth at bedtime. 12/05/19   [provider]  VYVANSE 20 MG capsule Take 20 mg by mouth daily with breakfast.  06/28/18   [provider]  VYVANSE 50 MG capsule Take 50 mg by mouth daily  with breakfast.  06/28/18   [provider]  PROAIR HFA 108 (90 Base) MCG/ACT inhaler INHALE 2 PUFFS BY MOUTH EVERY 6 HOURS AS NEEDED FOR WHEEZE OR SHORTNESS OF BREATH 03/08/19   Lovenia Kim, MD    Family History Family History  Problem Relation Age of Onset  . COPD Mother   . Depression Mother   . Diabetes Mother   . Hyperlipidemia Mother   . Asthma Father   . Arthritis Father   . Diabetes Father   . Heart disease Father   . Hyperlipidemia Father   . Hypertension Father   . Hypertension Brother     Social History Social History   Tobacco Use  .  Smoking status: Current Every Day Smoker    Packs/day: 2.00    Years: 21.00    Pack years: 42.00    Types: Cigarettes  . Smokeless tobacco: Never Used  Vaping Use  . Vaping Use: Former  . Devices: Increased nicotine cravings  Substance Use Topics  . Alcohol use: No    Alcohol/week: 0.0 standard drinks    Comment: quit 2013  . Drug use: Yes    Types: Marijuana    Comment: 7-27     Allergies   Metformin and related and Penicillins   Review of Systems Review of Systems  Constitutional: Negative for fatigue and fever.  Eyes: Negative for redness, itching and visual disturbance.  Respiratory: Negative for shortness of breath.   Cardiovascular: Negative for chest pain and leg swelling.  Gastrointestinal: Negative for nausea and vomiting.  Musculoskeletal: Positive for arthralgias, myalgias and neck pain.  Skin: Negative for color change, rash and wound.  Neurological: Negative for dizziness, syncope, weakness, light-headedness and headaches.     Physical Exam Triage Vital Signs ED Triage Vitals  Enc Vitals Group     BP 06/27/20 1510 139/88     Pulse Rate 06/27/20 1510 98     Resp 06/27/20 1510 19     Temp 06/27/20 1510 98.5 F (36.9 C)     Temp Source 06/27/20 1510 Oral     SpO2 06/27/20 1510 94 %     Weight 06/27/20 1511 247 lb (112 kg)     Height 06/27/20 1511 5\' 8"  (1.727 m)     Head Circumference  --      Peak Flow --      Pain Score 06/27/20 1511 10     Pain Loc --      Pain Edu? --      Excl. in GC? --    No data found.  Updated Vital Signs BP 139/88 (BP Location: Right Arm)   Pulse 98   Temp 98.5 F (36.9 C) (Oral)   Resp 19   Ht 5\' 8"  (1.727 m)   Wt 247 lb (112 kg)   SpO2 94%   BMI 37.56 kg/m   Visual Acuity Right Eye Distance:   Left Eye Distance:   Bilateral Distance:    Right Eye Near:   Left Eye Near:    Bilateral Near:     Physical Exam Vitals and nursing note reviewed.  Constitutional:      Appearance: He is well-developed.     Comments: No acute distress  HENT:     Head: Normocephalic and atraumatic.     Nose: Nose normal.  Eyes:     Conjunctiva/sclera: Conjunctivae normal.  Cardiovascular:     Rate and Rhythm: Normal rate.  Pulmonary:     Effort: Pulmonary effort is normal. No respiratory distress.  Abdominal:     General: There is no distension.  Musculoskeletal:        General: Normal range of motion.     Cervical back: Neck supple.     Comments: Right shoulder: Tenderness to palpation along distal clavicle, AC joint and throughout superior trapezius area extending into paracervical area on right side, tenderness to periscapular musculature, full active range of motion of neck, full range of motion of shoulder although does elicit pain beyond 90 degrees abduction, slight weakness with resisted external rotation and empty can with pain elicitation  Skin:    General: Skin is warm and dry.  Neurological:     Mental Status: He is  alert and oriented to person, place, and time.      UC Treatments / Results  Labs (all labs ordered are listed, but only abnormal results are displayed) Labs Reviewed - No data to display  EKG   Radiology No results found.  Procedures Procedures (including critical care time)  Medications Ordered in UC Medications - No data to display  Initial Impression / Assessment and Plan / UC Course  I have  reviewed the triage vital signs and the nursing notes.  Pertinent labs & imaging results that were available during my care of the patient were reviewed by me and considered in my medical decision making (see chart for details).     No acute bony abnormality, suspect likely inflammatory, cannot rule out underlying rotator cuff tendinopathy.  Will place referral for physical therapy given not tolerating steroids and needing to limit NSAIDs as much as possible.  Sports medicine referral placed as well.  Will provide Celebrex to use as trial as well as alternative muscle relaxer.  Gentle range of motion exercises and stretches.  Zofran for nausea.  Discussed strict return precautions. Patient verbalized understanding and is agreeable with plan.  Final Clinical Impressions(s) / UC Diagnoses   Final diagnoses:  Acute pain of right shoulder     Discharge Instructions     Physical therapy referral and sports medicine referral placed Celebrex twice daily with food Tizanidine every 6-8 hours as needed    ED Prescriptions    Medication Sig Dispense Auth. Provider   tiZANidine (ZANAFLEX) 4 MG tablet Take 1 tablet (4 mg total) by mouth every 6 (six) hours as needed for muscle spasms. 30 tablet Sanad Fearnow C, PA-C   celecoxib (CELEBREX) 100 MG capsule Take 1 capsule (100 mg total) by mouth 2 (two) times daily. 30 capsule Cherica Heiden C, PA-C   ondansetron (ZOFRAN ODT) 4 MG disintegrating tablet Take 1 tablet (4 mg total) by mouth every 8 (eight) hours as needed for nausea or vomiting. 20 tablet Royalty Domagala, [place] C, PA-C     I have reviewed the PDMP during this encounter.   Janith Lima, [place] 06/27/20 1631

## 2020-06-27 NOTE — Discharge Instructions (Signed)
Physical therapy referral and sports medicine referral placed Celebrex twice daily with food Tizanidine every 6-8 hours as needed

## 2020-06-27 NOTE — ED Triage Notes (Signed)
Chronic RT shoulder pain. Pt seen here recently, given steroids.  pcp took pt off steroids d/t he is diabetic. Was given tramadol by pcp and pt already has muscle relaxer's.  States nothing is helping.

## 2020-06-29 DIAGNOSIS — S4990XD Unspecified injury of shoulder and upper arm, unspecified arm, subsequent encounter: Secondary | ICD-10-CM | POA: Insufficient documentation

## 2020-06-29 NOTE — Assessment & Plan Note (Signed)
Refilled omeprazole for $RemoveBefore'20mg'[id number]$  daily in attempt to decrease dose.  Informed patient that he should monitor for symptoms at lower dose.  He will need to follow up with GI for complex abdominal surgeries and contributions to his symptoms

## 2020-06-29 NOTE — Assessment & Plan Note (Signed)
Poorly controlled. Likely exacerbated by the PO steroids from the UC and non-adherence with his prescribed medications. Again, spent considerable amount of time re-educating patient on insulin use.  Recommended he continue with his 36u of insulin daily and titrate up by 2u daily once he has discontinued his steroids to a goal of <150 fasting blood sugars. A1c today is 11.8 from 10.5 at last check.

## 2020-06-29 NOTE — Assessment & Plan Note (Signed)
Referred patient to sports medicine.  Given 1 week supply tramadol and informed this is not a long-term medication. Patient expressed understanding. Continue taking tylenol, muscle relaxer and avoid NSAIDs Can discontinue steroid.

## 2020-06-30 ENCOUNTER — Emergency Department (HOSPITAL_COMMUNITY)
Admission: EM | Admit: 2020-06-30 | Discharge: 2020-07-01 | Disposition: A | Discharge status: Home / Self Care | Payer: Medicare Other | Attending: Emergency Medicine | Admitting: Emergency Medicine

## 2020-06-30 ENCOUNTER — Other Ambulatory Visit: Payer: Self-pay

## 2020-06-30 ENCOUNTER — Encounter (HOSPITAL_COMMUNITY): Payer: Self-pay

## 2020-06-30 DIAGNOSIS — R42 Dizziness and giddiness: Secondary | ICD-10-CM | POA: Diagnosis not present

## 2020-06-30 DIAGNOSIS — E1165 Type 2 diabetes mellitus with hyperglycemia: Secondary | ICD-10-CM | POA: Diagnosis not present

## 2020-06-30 DIAGNOSIS — F17210 Nicotine dependence, cigarettes, uncomplicated: Secondary | ICD-10-CM | POA: Diagnosis not present

## 2020-06-30 DIAGNOSIS — Z794 Long term (current) use of insulin: Secondary | ICD-10-CM | POA: Diagnosis not present

## 2020-06-30 DIAGNOSIS — E860 Dehydration: Secondary | ICD-10-CM | POA: Insufficient documentation

## 2020-06-30 DIAGNOSIS — E876 Hypokalemia: Secondary | ICD-10-CM | POA: Insufficient documentation

## 2020-06-30 DIAGNOSIS — Z79899 Other long term (current) drug therapy: Secondary | ICD-10-CM | POA: Diagnosis not present

## 2020-06-30 DIAGNOSIS — R739 Hyperglycemia, unspecified: Secondary | ICD-10-CM

## 2020-06-30 LAB — CBG MONITORING, ED: Glucose-Capillary: 326 mg/dL — ABNORMAL HIGH (ref 70–99)

## 2020-06-30 MED ORDER — SODIUM CHLORIDE 0.9 % IV BOLUS
1000.0000 mL | Freq: Once | INTRAVENOUS | Status: AC
  Administered 2020-06-30: 1000 mL via INTRAVENOUS

## 2020-06-30 NOTE — ED Triage Notes (Signed)
Pt reports near syncope today and high blood sugar. Pt reports feeling lightheaded, headache, and "feeling dehydrated". CBG in triage is 326.

## 2020-07-01 ENCOUNTER — Emergency Department (HOSPITAL_COMMUNITY): Payer: Medicare Other

## 2020-07-01 DIAGNOSIS — R42 Dizziness and giddiness: Secondary | ICD-10-CM | POA: Diagnosis not present

## 2020-07-01 LAB — URINALYSIS, ROUTINE W REFLEX MICROSCOPIC
Bacteria, UA: NONE SEEN
Bilirubin Urine: NEGATIVE
Glucose, UA: >=500 mg/dL — AB
Hgb urine dipstick: NEGATIVE
Ketones, ur: NEGATIVE mg/dL
Leukocytes,Ua: NEGATIVE
Nitrite: NEGATIVE
Protein, ur: NEGATIVE mg/dL
Specific Gravity, Urine: 1.035 — ABNORMAL HIGH (ref 1.005–1.030)
pH: 6 (ref 5.0–8.0)

## 2020-07-01 LAB — COMPREHENSIVE METABOLIC PANEL
ALT: 43 U/L (ref 0–44)
AST: 29 U/L (ref 15–41)
Albumin: 4.3 g/dL (ref 3.5–5.0)
Alkaline Phosphatase: 87 U/L (ref 38–126)
Anion gap: 13 (ref 5–15)
BUN: 7 mg/dL (ref 6–20)
CO2: 28 mmol/L (ref 22–32)
Calcium: 9.3 mg/dL (ref 8.9–10.3)
Chloride: 92 mmol/L — ABNORMAL LOW (ref 98–111)
Creatinine, Ser: 0.87 mg/dL (ref 0.61–1.24)
GFR calc Af Amer: >60 mL/min (ref >60)
GFR calc non Af Amer: >60 mL/min (ref >60)
Glucose, Bld: 245 mg/dL — ABNORMAL HIGH (ref 70–99)
Potassium: 2.6 mmol/L — CL (ref 3.5–5.1)
Sodium: 133 mmol/L — ABNORMAL LOW (ref 135–145)
Total Bilirubin: 0.5 mg/dL (ref 0.3–1.2)
Total Protein: 8.1 g/dL (ref 6.5–8.1)

## 2020-07-01 LAB — CBC WITH DIFFERENTIAL/PLATELET
Abs Immature Granulocytes: 0.05 10*3/uL (ref 0.00–0.07)
Basophils Absolute: 0.1 10*3/uL (ref 0.0–0.1)
Basophils Relative: 1 %
Eosinophils Absolute: 0.3 10*3/uL (ref 0.0–0.5)
Eosinophils Relative: 2 %
HCT: 47.5 % (ref 39.0–52.0)
Hemoglobin: 15.8 g/dL (ref 13.0–17.0)
Immature Granulocytes: 0 %
Lymphocytes Relative: 34 %
Lymphs Abs: 4.7 10*3/uL — ABNORMAL HIGH (ref 0.7–4.0)
MCH: 27.1 pg (ref 26.0–34.0)
MCHC: 33.3 g/dL (ref 30.0–36.0)
MCV: 81.3 fL (ref 80.0–100.0)
Monocytes Absolute: 0.7 10*3/uL (ref 0.1–1.0)
Monocytes Relative: 5 %
Neutro Abs: 8.2 10*3/uL — ABNORMAL HIGH (ref 1.7–7.7)
Neutrophils Relative %: 58 %
Platelets: 206 10*3/uL (ref 150–400)
RBC: 5.84 MIL/uL — ABNORMAL HIGH (ref 4.22–5.81)
RDW: 15 % (ref 11.5–15.5)
WBC: 14 10*3/uL — ABNORMAL HIGH (ref 4.0–10.5)
nRBC: 0 % (ref 0.0–0.2)

## 2020-07-01 LAB — D-DIMER, QUANTITATIVE: D-Dimer, Quant: 0.4 ug/mL-FEU (ref 0.00–0.50)

## 2020-07-01 LAB — TROPONIN I (HIGH SENSITIVITY): Troponin I (High Sensitivity): 8 ng/L (ref <18)

## 2020-07-01 MED ORDER — MAGNESIUM SULFATE 2 GM/50ML IV SOLN
2.0000 g | Freq: Once | INTRAVENOUS | Status: AC
  Administered 2020-07-01: 2 g via INTRAVENOUS
  Filled 2020-07-01: qty 50

## 2020-07-01 MED ORDER — ACETAMINOPHEN 325 MG PO TABS
650.0000 mg | ORAL_TABLET | Freq: Once | ORAL | Status: AC
  Administered 2020-07-01: 650 mg via ORAL
  Filled 2020-07-01: qty 2

## 2020-07-01 MED ORDER — POTASSIUM CHLORIDE CRYS ER 20 MEQ PO TBCR: 20.0000 meq | EXTENDED_RELEASE_TABLET | Freq: Two times a day (BID) | ORAL | 0 refills | Status: DC

## 2020-07-01 MED ORDER — POTASSIUM CHLORIDE CRYS ER 20 MEQ PO TBCR
40.0000 meq | EXTENDED_RELEASE_TABLET | Freq: Once | ORAL | Status: AC
  Administered 2020-07-01: 40 meq via ORAL
  Filled 2020-07-01: qty 2

## 2020-07-01 MED ORDER — POTASSIUM CHLORIDE 10 MEQ/100ML IV SOLN
10.0000 meq | INTRAVENOUS | Status: AC
  Administered 2020-07-01 (×2): 10 meq via INTRAVENOUS
  Filled 2020-07-01 (×2): qty 100

## 2020-07-01 NOTE — ED Notes (Signed)
Pt received printed prescription and discharge papers. Did not sign

## 2020-07-01 NOTE — Discharge Instructions (Addendum)
You were seen today for dizziness and near syncope.  Your work-up is notable for significant dehydration.  Make sure that you are staying hydrated with electrolyte rich fluids.  Monitor your blood sugar closely.  Your potassium was significantly low.  You will be placed on 5 days of supplemental potassium.  Take as directed and follow-up with your primary physician for recheck in 5 days.

## 2020-07-01 NOTE — ED Provider Notes (Signed)
[facility] EMERGENCY DEPARTMENT Provider Note   CSN: [id number] Arrival date & time: 06/30/20  2207     History Chief Complaint  Patient presents with  . Near Syncope    [name] is a 39 y.o. male.  HPI     This is a 39 year old male with a history of diabetes, bipolar disorder, hyperlipidemia, Becker's muscular dystrophy who presents with dizziness and syncope.  Patient reports that yesterday morning he got very dizzy.  He laid down on the couch and "blacked out."  Denies chest pain but has had some shortness of breath.  He states throughout the day he tried to drink but continued to have dizziness.  He describes it as lightheadedness.  He denies any room spinning dizziness.  Denies weakness, numbness, tingling, strokelike symptoms.  Patient denies any recent illnesses or cough.  No Covid symptoms or exposures.  He is a diabetic and states that his blood sugars have been high recently.  No recent changes in his diabetic medication.  Reports blood sugars greater than 500 over the last several days.  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  He states that his blood pressure and blood sugar were both high.  Past Medical History:  Diagnosis Date  . Allergy   . Anxiety   . Becker's muscular dystrophy ([place])   . Bipolar disorder ([place])    On disability  . Depression   . Diabetes mellitus without complication ([place])    per patient : under control with diet, does not monitor cbg at home   . Headache(784.0)    daily headaches  . Hyperlipidemia   . Neuromuscular disorder ([place])    Beckers muscular dystrophy  . Obesity   . Sleep apnea    Does not tolerate CPAP    Patient Active Problem List   Diagnosis Date Noted  . Shoulder injury, subsequent encounter 06/29/2020  . High priority for COVID-19 virus vaccination 03/29/2020  . Type 2 diabetes mellitus with hyperglycemia ([place]) 03/01/2020  .  Abdominal pain 11/22/2018  . Memory difficulty 10/04/2018  . Erectile dysfunction 01/29/2018  . Neck pain 11/12/2016  . Back pain 10/25/2016  . Allergic rhinitis 04/28/2016  . GERD (gastroesophageal reflux disease) 03/21/2013  . Elevated BP 04/25/2012  . Narcotic abuse ([place]) 06/05/2011  . OBESITY 10/17/2010  . 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 SLEEP APNEA 01/24/2009  . BIPOLAR DISORDER UNSPECIFIED 05/05/2008  . HYPERLIPIDEMIA 03/30/2008  . ABUSE, ALCOHOL, UNSPECIFIED 04/26/2007  . TOBACCO ABUSE 12/25/2006    Past Surgical History:  Procedure Laterality Date  . APPLICATION OF WOUND VAC N/A 11/23/2018   Procedure: Application Of Wound Vac;  Surgeon: Kinsinger, Arta Bruce, MD;  Location: [place];  Service: General;  Laterality: N/A;  . COLECTOMY N/A 11/23/2018   Procedure: TOTAL COLECTOMY;  Surgeon: Kieth Brightly Arta Bruce, MD;  Location: [place];  Service: General;  Laterality: N/A;  . ILEOSTOMY N/A 11/23/2018   Procedure: ILEOSTOMY;  Surgeon: Kinsinger, Arta Bruce, MD;  Location: [place];  Service: General;  Laterality: N/A;  . ILEOSTOMY CLOSURE N/A 05/19/2019   Procedure: ILEOSTOMY REVERSAL WITH ILEOCOLIC AND COLORECTAL ANASTOMOSIS;  Surgeon: Kinsinger, Arta Bruce, MD;  Location: [facility];  Service: General;  Laterality: N/A;  . NM MYOCAR PERF WALL MOTION  01/22/2011   protocol: Persantine, moderate reversible inferior defect post stress EF 48%, high risk scan  . TRANSTHORACIC ECHOCARDIOGRAM  07/05/2004   EF=>55%  normal study        Family History  Problem Relation Age of Onset  . COPD Mother   . Depression Mother   . Diabetes Mother   . Hyperlipidemia Mother   . Asthma Father   . Arthritis Father   . Diabetes Father   . Heart disease Father   . Hyperlipidemia Father   . Hypertension Father   . Hypertension Brother     Social History   Tobacco Use  . Smoking status: Current Every Day Smoker    Packs/day: 2.00    Years: 21.00    Pack years: 42.00    Types: Cigarettes  .  Smokeless tobacco: Never Used  Vaping Use  . Vaping Use: Former  . Devices: Increased nicotine cravings  Substance Use Topics  . Alcohol use: No    Alcohol/week: 0.0 standard drinks    Comment: quit 2013  . Drug use: Yes    Types: Marijuana    Comment: 7-27    Home Medications Prior to Admission medications   Medication Sig Start Date End Date Taking? Authorizing Provider  atorvastatin (LIPITOR) 40 MG tablet Take 1 tablet (40 mg total) by mouth daily. 08/29/19   Anderson, Chelsey L, DO  Blood Glucose Monitoring Suppl (ONE TOUCH ULTRA 2) w/Device KIT USE TWO TIMES A DAY 12/21/19   Doristine Mango L, DO  celecoxib (CELEBREX) 100 MG capsule Take 1 capsule (100 mg total) by mouth 2 (two) times daily. 06/27/20   Wieters, Hallie C, PA-C  dicyclomine (BENTYL) 10 MG capsule Take 1 capsule (10 mg total) by mouth 4 (four) times daily -  before meals and at bedtime. 10/06/19   Kathrene Alu, MD  diphenhydramine-acetaminophen (TYLENOL PM) 25-500 MG TABS tablet Take 3 tablets by mouth at bedtime.    [provider]  empagliflozin (JARDIANCE) 25 MG TABS tablet Take 25 mg by mouth daily. 12/21/19   Anderson, Chelsey L, DO  gabapentin (NEURONTIN) 300 MG capsule Take 300 mg by mouth daily.  06/20/16   [provider]  glucose blood (ONETOUCH ULTRA) test strip USE TO TEST TWO TIMES A DAY 04/06/20   Anderson, Chelsey L, DO  insulin glargine (LANTUS) 100 UNIT/ML Solostar Pen Inject 40 Units into the skin every morning. Start at 10 units daily then increase by 1 unit daily. 12/27/19   Zenia Resides, MD  Insulin Pen Needle 32G X 6 MM MISC 1 packet by Does not apply route 2 (two) times daily. 03/29/20   Anderson, Chelsey L, DO  Lancets (ONETOUCH DELICA PLUS [id number]) MISC USE TO TEST TWO TIMES A DAY 12/21/19   Anderson, Chelsey L, DO  LATUDA 120 MG TABS Take 1 tablet by mouth at bedtime. 12/07/19   [provider]  omeprazole (PRILOSEC) 20 MG capsule Take 2 capsules (40 mg total) by mouth  at bedtime. 06/26/20   Anderson, Chelsey L, DO  ondansetron (ZOFRAN ODT) 4 MG disintegrating tablet Take 1 tablet (4 mg total) by mouth every 8 (eight) hours as needed for nausea or vomiting. 06/27/20   Wieters, Hallie C, PA-C  potassium chloride SA (KLOR-CON) 20 MEQ tablet Take 1 tablet (20 mEq total) by mouth 2 (two) times daily. 07/01/20   , Barbette Hair, MD  propranolol (INDERAL) 40 MG tablet Take 40 mg by mouth 2 (two) times daily. 12/05/19   [provider]  tiZANidine (ZANAFLEX) 4 MG tablet Take 1 tablet (4 mg total) by mouth every 6 (six) hours as needed for muscle spasms.  06/27/20   Wieters, Hallie C, PA-C  traMADol (ULTRAM) 50 MG tablet Take 1 tablet (50 mg total) by mouth every 8 (eight) hours as needed for up to 5 days. 06/26/20 07/01/20  Anderson, Chelsey L, DO  traZODone (DESYREL) 100 MG tablet Take 100 mg by mouth at bedtime. 12/05/19   [provider]  VYVANSE 20 MG capsule Take 20 mg by mouth daily with breakfast.  06/28/18   [provider]  VYVANSE 50 MG capsule Take 50 mg by mouth daily with breakfast.  06/28/18   [provider]  PROAIR HFA 108 (90 Base) MCG/ACT inhaler INHALE 2 PUFFS BY MOUTH EVERY 6 HOURS AS NEEDED FOR WHEEZE OR SHORTNESS OF BREATH 03/08/19   Lovenia Kim, MD    Allergies    Metformin and related and Penicillins  Review of Systems   Review of Systems  Constitutional: Negative for fever.  HENT: Negative for congestion.   Respiratory: Positive for shortness of breath. Negative for cough and wheezing.   Cardiovascular: Negative for chest pain.  Gastrointestinal: Negative for abdominal pain, diarrhea, nausea and vomiting.  Genitourinary: Negative for dysuria.  Neurological: Positive for dizziness, syncope and light-headedness. Negative for seizures, speech difficulty and headaches.  All other systems reviewed and are negative.   Physical Exam Updated Vital Signs BP (!) 138/91   Pulse 82   Temp 98.5 F (36.9 C) (Oral)   Resp  18   Ht 1.727 m (5' 8")   Wt 112 kg   SpO2 98%   BMI 37.56 kg/m   Physical Exam Vitals and nursing note reviewed.  Constitutional:      Appearance: He is well-developed. He is obese. He is not ill-appearing.  HENT:     Head: Normocephalic and atraumatic.     Nose: Nose normal.     Mouth/Throat:     Mouth: Mucous membranes are dry.  Eyes:     Extraocular Movements: Extraocular movements intact.     Pupils: Pupils are equal, round, and reactive to light.  Cardiovascular:     Rate and Rhythm: Regular rhythm. Tachycardia present.     Heart sounds: Normal heart sounds. No murmur heard.   Pulmonary:     Effort: Pulmonary effort is normal. No respiratory distress.     Breath sounds: Normal breath sounds. No wheezing.  Abdominal:     General: Bowel sounds are normal.     Palpations: Abdomen is soft.     Tenderness: There is no abdominal tenderness. There is no rebound.     Comments: Extensive abdominal scarring  Musculoskeletal:        General: No tenderness.     Cervical back: Neck supple.     Right lower leg: No edema.     Left lower leg: No edema.  Lymphadenopathy:     Cervical: No cervical adenopathy.  Skin:    General: Skin is warm and dry.  Neurological:     Mental Status: He is alert and oriented to person, place, and time.  Psychiatric:        Mood and Affect: Mood normal.     ED Results / Procedures / Treatments   Labs (all labs ordered are listed, but only abnormal results are displayed) Labs Reviewed  CBC WITH DIFFERENTIAL/PLATELET - Abnormal; Notable for the following components:      Result Value   WBC 14.0 (*)    RBC 5.84 (*)    Neutro Abs 8.2 (*)    Lymphs Abs 4.7 (*)  All other components within normal limits  COMPREHENSIVE METABOLIC PANEL - Abnormal; Notable for the following components:   Sodium 133 (*)    Potassium 2.6 (*)    Chloride 92 (*)    Glucose, Bld 245 (*)    All other components within normal limits  URINALYSIS, ROUTINE W REFLEX  MICROSCOPIC - Abnormal; Notable for the following components:   Color, Urine STRAW (*)    Specific Gravity, Urine 1.035 (*)    Glucose, UA >=500 (*)    All other components within normal limits  CBG MONITORING, ED - Abnormal; Notable for the following components:   Glucose-Capillary 326 (*)    All other components within normal limits  D-DIMER, QUANTITATIVE (NOT AT [facility])  POTASSIUM  TROPONIN I (HIGH SENSITIVITY)    EKG EKG Interpretation  Date/Time:  Saturday June 30 2020 23:41:47 EDT Ventricular Rate:  94 PR Interval:    QRS Duration: 96 QT Interval:  357 QTC Calculation: 447 R Axis:   93 Text Interpretation: Sinus rhythm Borderline right axis deviation Confirmed by Thayer Jew [phone]) on 07/01/2020 3:02:20 AM   Radiology DG Chest 2 View  Result Date: 07/01/2020 CLINICAL DATA:  Shortness of breath and syncope today. High blood sugar. EXAM: CHEST - 2 VIEW COMPARISON:  11/30/2018 FINDINGS: Heart size and pulmonary vascularity are normal. Lungs are clear. No pleural effusions. No pneumothorax. Mediastinal contours appear intact. Previous infiltrates have resolved. IMPRESSION: No active cardiopulmonary disease. Electronically Signed   By: Lucienne Capers M.D.   On: 07/01/2020 01:31    Procedures .Critical Care Performed by: Merryl Hacker, MD Authorized by: Merryl Hacker, MD   Critical care provider statement:    Critical care time (minutes):  40   Critical care was necessary to treat or prevent imminent or life-threatening deterioration of the following conditions:  Metabolic crisis and dehydration   Critical care was time spent personally by me on the following activities:  Discussions with consultants, evaluation of patient's response to treatment, examination of patient, ordering and performing treatments and interventions, ordering and review of laboratory studies, ordering and review of radiographic studies, pulse oximetry, re-evaluation of patient's  condition, obtaining history from patient or surrogate and review of old charts   (including critical care time)  Medications Ordered in ED Medications  sodium chloride 0.9 % bolus 1,000 mL (0 mLs Intravenous Stopped 07/01/20 0202)  potassium chloride 10 mEq in 100 mL IVPB (0 mEq Intravenous Stopped 07/01/20 0230)  potassium chloride SA (KLOR-CON) CR tablet 40 mEq (40 mEq Oral Given 07/01/20 0051)  magnesium sulfate IVPB 2 g 50 mL (0 g Intravenous Stopped 07/01/20 0114)  acetaminophen (TYLENOL) tablet 650 mg (650 mg Oral Given 07/01/20 0248)    ED Course  I have reviewed the triage vital signs and the nursing notes.  Pertinent labs & imaging results that were available during my care of the patient were reviewed by me and considered in my medical decision making (see chart for details).    MDM Rules/Calculators/A&P                           Patient presents with near syncope and dizziness.  He is overall nontoxic vital signs notable for tachycardia.  Considerations include but not limited to, arrhythmia, dehydration, electrolyte disturbance.  Labs obtained.  Orthostatics show increase in heart rate by greater than 20 which is positive.  Patient was given fluids.  He is hyperglycemic but without evidence of DKA.  Lab work-up is notable for mild leukocytosis but this may be related to hemoconcentration.  Potassium is 2.6.  No QT prolongation or arrhythmia noted on EKG.  Patient was given 2 rounds of IV potassium, oral potassium, and IV magnesium.  Given syncope, troponin was ordered and this is negative.  Doubt ACS.  Patient refused repeat troponin and repeat potassium.  Clinically he improved and orthostatics normalized.  Suspect dehydration as a primary cause of his symptoms.  Will discharge with a 5-day course of potassium and recommend recheck by his primary physician in 1 week.  Also recommend aggressive hydration and monitoring closely of blood sugars.  After history, exam, and medical workup  I feel the patient has been appropriately medically screened and is safe for discharge home. Pertinent diagnoses were discussed with the patient. Patient was given return precautions.   Final Clinical Impression(s) / ED Diagnoses Final diagnoses:  Dehydration  Dizziness  Hypokalemia  Hyperglycemia    Rx / DC Orders ED Discharge Orders         Ordered    potassium chloride SA (KLOR-CON) 20 MEQ tablet  2 times daily        07/01/20 0348           , Barbette Hair, MD 07/01/20 [id number]

## 2020-07-01 NOTE — ED Notes (Signed)
Date and time results received: 07/01/20 0026 (use smartphrase ".now" to insert current time)  Test: Potassium Critical Value: 2.6  Name of Provider Notified: [place]  Orders Received? Or Actions Taken?:

## 2020-07-10 ENCOUNTER — Other Ambulatory Visit: Payer: Self-pay

## 2020-07-10 ENCOUNTER — Ambulatory Visit: Payer: Medicare Other

## 2020-07-10 ENCOUNTER — Ambulatory Visit (INDEPENDENT_AMBULATORY_CARE_PROVIDER_SITE_OTHER): Payer: Medicare Other | Admitting: Orthopaedic Surgery

## 2020-07-10 ENCOUNTER — Encounter: Payer: Self-pay | Admitting: Orthopaedic Surgery

## 2020-07-10 VITALS — BP 140/90 | HR 108 | Ht 68.0 in | Wt 238.0 lb

## 2020-07-10 DIAGNOSIS — R202 Paresthesia of skin: Secondary | ICD-10-CM

## 2020-07-10 NOTE — Progress Notes (Signed)
Subjective:    Patient ID: [name], male    DOB: 1981/02/27, 39 y.o.   MRN: [id number]  HPI He hurt his left shoulder about a month ago.  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  He has been to Urgent Care twice and once to the ER for the right shoulder.  I have reviewed the notes.  He had x-rays of the right shoulder on 06-24-2020.    I have independently reviewed and interpreted x-rays of this patient done at another site by another physician or qualified health professional.  His main complaint today is pain from the neck to the right shoulder to the elbow to the hand and numbness in the long and index fingers of the right hand.  This is getting worse.  He is on multiple medications including Neurontin.  He is a diabetic oral controlled.  He has GERD.     Review of Systems  Constitutional: Positive for activity change.  Musculoskeletal: Positive for arthralgias, neck pain and neck stiffness.  All other systems reviewed and are negative.  For Review of Systems, all other systems reviewed and are negative.  The following is a summary of the past history medically, past history surgically, known current medicines, social history and family history.  This information is gathered electronically by the computer from prior information and documentation.  I review this each visit and have found including this information at this point in the chart is beneficial and informative.   Past Medical History:  Diagnosis Date  . Allergy   . Anxiety   . Becker's muscular dystrophy ([place])   . Bipolar disorder ([place])    On disability  . Depression   . Diabetes mellitus without complication ([place])    per patient : under control with diet, does not monitor cbg at home   . Headache(784.0)    daily headaches  . Hyperlipidemia   . Neuromuscular disorder ([place])    Beckers muscular dystrophy  . Obesity   . Sleep apnea    Does not tolerate CPAP     Past Surgical History:  Procedure Laterality Date  . APPLICATION OF WOUND VAC N/A 11/23/2018   Procedure: Application Of Wound Vac;  Surgeon: Kinsinger, Arta Bruce, MD;  Location: [place];  Service: General;  Laterality: N/A;  . COLECTOMY N/A 11/23/2018   Procedure: TOTAL COLECTOMY;  Surgeon: Kieth Brightly Arta Bruce, MD;  Location: [place];  Service: General;  Laterality: N/A;  . ILEOSTOMY N/A 11/23/2018   Procedure: ILEOSTOMY;  Surgeon: Kinsinger, Arta Bruce, MD;  Location: [place];  Service: General;  Laterality: N/A;  . ILEOSTOMY CLOSURE N/A 05/19/2019   Procedure: ILEOSTOMY REVERSAL WITH ILEOCOLIC AND COLORECTAL ANASTOMOSIS;  Surgeon: Kinsinger, Arta Bruce, MD;  [facility];  Service: General;  Laterality: N/A;  . NM MYOCAR PERF WALL MOTION  01/22/2011   protocol: Persantine, moderate reversible inferior defect post stress EF 48%, high risk scan  . TRANSTHORACIC ECHOCARDIOGRAM  07/05/2004   EF=>55% normal study     Current Outpatient Medications on File Prior to Visit  Medication Sig Dispense Refill  . atorvastatin (LIPITOR) 40 MG tablet Take 1 tablet (40 mg total) by mouth daily. 90 tablet 1  . Blood Glucose Monitoring Suppl (ONE TOUCH ULTRA 2) w/Device KIT USE TWO TIMES A DAY 1 kit 0  . celecoxib (CELEBREX) 100 MG capsule Take 1 capsule (100 mg total) by mouth 2 (two) times daily.  30 capsule 0  . dicyclomine (BENTYL) 10 MG capsule Take 1 capsule (10 mg total) by mouth 4 (four) times daily -  before meals and at bedtime. 30 capsule 0  . diphenhydramine-acetaminophen (TYLENOL PM) 25-500 MG TABS tablet Take 3 tablets by mouth at bedtime.    . empagliflozin (JARDIANCE) 25 MG TABS tablet Take 25 mg by mouth daily. 90 tablet 3  . gabapentin (NEURONTIN) 300 MG capsule Take 300 mg by mouth daily.   1  . glucose blood (ONETOUCH ULTRA) test strip USE TO TEST TWO TIMES A DAY 100 strip 3  . insulin glargine (LANTUS) 100 UNIT/ML Solostar Pen Inject 40 Units into the skin every morning. Start at 10  units daily then increase by 1 unit daily. 3 mL 11  . Insulin Pen Needle 32G X 6 MM MISC 1 packet by Does not apply route 2 (two) times daily. 1 each 4  . Lancets (ONETOUCH DELICA PLUS [id number]) MISC USE TO TEST TWO TIMES A DAY 100 each 0  . LATUDA 120 MG TABS Take 1 tablet by mouth at bedtime.    Marland Kitchen omeprazole (PRILOSEC) 20 MG capsule Take 2 capsules (40 mg total) by mouth at bedtime. 180 capsule 1  . ondansetron (ZOFRAN ODT) 4 MG disintegrating tablet Take 1 tablet (4 mg total) by mouth every 8 (eight) hours as needed for nausea or vomiting. 20 tablet 0  . potassium chloride SA (KLOR-CON) 20 MEQ tablet Take 1 tablet (20 mEq total) by mouth 2 (two) times daily. 10 tablet 0  . propranolol (INDERAL) 40 MG tablet Take 40 mg by mouth 2 (two) times daily.    Marland Kitchen tiZANidine (ZANAFLEX) 4 MG tablet Take 1 tablet (4 mg total) by mouth every 6 (six) hours as needed for muscle spasms. 30 tablet 0  . traZODone (DESYREL) 100 MG tablet Take 100 mg by mouth at bedtime.    Marland Kitchen VYVANSE 20 MG capsule Take 20 mg by mouth daily with breakfast.     . VYVANSE 50 MG capsule Take 50 mg by mouth daily with breakfast.     . [DISCONTINUED] PROAIR HFA 108 (90 Base) MCG/ACT inhaler INHALE 2 PUFFS BY MOUTH EVERY 6 HOURS AS NEEDED FOR WHEEZE OR SHORTNESS OF BREATH 8.5 g 0   No current facility-administered medications on file prior to visit.    Social History   Socioeconomic History  . Marital status: Single    Spouse name: Not on file  . Number of children: 2  . Years of education: 71  . Highest education level: Not on file  Occupational History    Employer: UNEMPLOYED  Tobacco Use  . Smoking status: Current Every Day Smoker    Packs/day: 2.00    Years: 21.00    Pack years: 42.00    Types: Cigarettes  . Smokeless tobacco: Never Used  Vaping Use  . Vaping Use: Former  . Devices: Increased nicotine cravings  Substance and Sexual Activity  . Alcohol use: No    Alcohol/week: 0.0 standard drinks    Comment: quit  2013  . Drug use: Yes    Types: Marijuana    Comment: 7-27  . Sexual activity: Yes    Birth control/protection: None  Other Topics Concern  . Not on file  Social History Narrative   Patient lives at home with parents, brother and brothers wife.    Patient is single.    Patient has 2 children.    Patient has a high school education.  Patient is unemployed.    Patient is right handed.    Social Determinants of Health   Financial Resource Strain:   . Difficulty of Paying Living Expenses: Not on file  Food Insecurity:   . Worried About Charity fundraiser in the Last Year: Not on file  . Ran Out of Food in the Last Year: Not on file  Transportation Needs:   . Lack of Transportation (Medical): Not on file  . Lack of Transportation (Non-Medical): Not on file  Physical Activity:   . Days of Exercise per Week: Not on file  . Minutes of Exercise per Session: Not on file  Stress:   . Feeling of Stress : Not on file  Social Connections:   . Frequency of Communication with Friends and Family: Not on file  . Frequency of Social Gatherings with Friends and Family: Not on file  . Attends Religious Services: Not on file  . Active Member of Clubs or Organizations: Not on file  . Attends Archivist Meetings: Not on file  . Marital Status: Not on file  Intimate Partner Violence:   . Fear of Current or Ex-Partner: Not on file  . Emotionally Abused: Not on file  . Physically Abused: Not on file  . Sexually Abused: Not on file    Family History  Problem Relation Age of Onset  . COPD Mother   . Depression Mother   . Diabetes Mother   . Hyperlipidemia Mother   . Asthma Father   . Arthritis Father   . Diabetes Father   . Heart disease Father   . Hyperlipidemia Father   . Hypertension Father   . Hypertension Brother     BP 140/90   Pulse (!) 108   Ht $R'5\' 8"'ez$  (1.727 m)   Wt 238 lb (108 kg)   BMI 36.19 kg/m   Body mass index is 36.19 kg/m.     Objective:    Physical Exam Vitals and nursing note reviewed.  Constitutional:      Appearance: He is well-developed.  HENT:     Head: Normocephalic and atraumatic.  Eyes:     Conjunctiva/sclera: Conjunctivae normal.     Pupils: Pupils are equal, round, and reactive to light.  Cardiovascular:     Rate and Rhythm: Normal rate and regular rhythm.  Pulmonary:     Effort: Pulmonary effort is normal.  Abdominal:     Palpations: Abdomen is soft.  Musculoskeletal:       Arms:     Cervical back: Normal range of motion and neck supple.  Skin:    General: Skin is warm and dry.  Neurological:     Mental Status: He is alert and oriented to person, place, and time.     Cranial Nerves: No cranial nerve deficit.     Motor: No abnormal muscle tone.     Coordination: Coordination normal.     Deep Tendon Reflexes: Reflexes are normal and symmetric. Reflexes normal.  Psychiatric:        Behavior: Behavior normal.        Thought Content: Thought content normal.        Judgment: Judgment normal.      X-rays were done of the cervical spine, reported separately.     Assessment & Plan:   Encounter Diagnosis  Name Primary?  . Paresthesia of right arm Yes   I am concerned about cervical HNP.  I will get MRI.  He has not improved over  the last month and has been to ER/Urgent care three times.  He is on multiple medicines and I will not add to this.  Return in two weeks.  Call if any problem.  Precautions discussed.   Electronically Signed Sanjuana Kava, MD 9/21/202110:09 AM

## 2020-07-11 ENCOUNTER — Ambulatory Visit: Payer: Medicare Other

## 2020-07-13 ENCOUNTER — Ambulatory Visit: Payer: Medicare Other | Admitting: Student in an Organized Health Care Education/Training Program

## 2020-07-18 ENCOUNTER — Other Ambulatory Visit: Payer: Self-pay

## 2020-07-23 ENCOUNTER — Encounter (HOSPITAL_COMMUNITY): Payer: Self-pay | Admitting: Emergency Medicine

## 2020-07-23 ENCOUNTER — Emergency Department (HOSPITAL_COMMUNITY)
Admission: EM | Admit: 2020-07-23 | Discharge: 2020-07-23 | Disposition: A | Discharge status: Home / Self Care | Payer: Medicare Other | Attending: Emergency Medicine | Admitting: Emergency Medicine

## 2020-07-23 ENCOUNTER — Other Ambulatory Visit: Payer: Self-pay

## 2020-07-23 ENCOUNTER — Emergency Department (HOSPITAL_COMMUNITY): Payer: Medicare Other

## 2020-07-23 DIAGNOSIS — F17210 Nicotine dependence, cigarettes, uncomplicated: Secondary | ICD-10-CM | POA: Diagnosis not present

## 2020-07-23 DIAGNOSIS — E1165 Type 2 diabetes mellitus with hyperglycemia: Secondary | ICD-10-CM | POA: Diagnosis not present

## 2020-07-23 DIAGNOSIS — Z794 Long term (current) use of insulin: Secondary | ICD-10-CM | POA: Diagnosis not present

## 2020-07-23 DIAGNOSIS — S99911A Unspecified injury of right ankle, initial encounter: Secondary | ICD-10-CM | POA: Diagnosis present

## 2020-07-23 DIAGNOSIS — W500XXA Accidental hit or strike by another person, initial encounter: Secondary | ICD-10-CM | POA: Insufficient documentation

## 2020-07-23 DIAGNOSIS — Y9372 Activity, wrestling: Secondary | ICD-10-CM | POA: Diagnosis not present

## 2020-07-23 DIAGNOSIS — S93401A Sprain of unspecified ligament of right ankle, initial encounter: Secondary | ICD-10-CM | POA: Insufficient documentation

## 2020-07-23 NOTE — ED Triage Notes (Signed)
Pt was wrestling and fell on his ankle.

## 2020-07-23 NOTE — Discharge Instructions (Addendum)
Follow up with podiatry if not improving. Take Motrin and Tylenol as needed as directed for pain. Apply ice and elevate for 30 minutes three times daily.

## 2020-07-23 NOTE — ED Provider Notes (Signed)
[facility] EMERGENCY DEPARTMENT Provider Note   CSN: [id number] Arrival date & time: 07/23/20  1734     History Chief Complaint  Patient presents with  . Ankle Pain    [name] is a 39 y.o. male.  39 year old male presents with complaint of pain in the right foot and ankle.  Patient states that he was wrestling yesterday and fell on his ankle.  Patient is able to bear weight with pain.  No other injuries or concerns.        Past Medical History:  Diagnosis Date  . Allergy   . Anxiety   . 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 Bipolar disorder ([place])    On disability  . Depression   . Diabetes mellitus without complication ([place])    per patient : under control with diet, does not monitor cbg at home   . Headache(784.0)    daily headaches  . Hyperlipidemia   . Neuromuscular disorder ([place])    Beckers muscular dystrophy  . Obesity   . Sleep apnea    Does not tolerate CPAP    Patient Active Problem List   Diagnosis Date Noted  . Shoulder injury, subsequent encounter 06/29/2020  . High priority for COVID-19 virus vaccination 03/29/2020  . Type 2 diabetes mellitus with hyperglycemia ([place]) 03/01/2020  . Abdominal pain 11/22/2018  . Memory difficulty 10/04/2018  . Erectile dysfunction 01/29/2018  . Neck pain 11/12/2016  . Back pain 10/25/2016  . Allergic rhinitis 04/28/2016  . GERD (gastroesophageal reflux disease) 03/21/2013  . Elevated BP 04/25/2012  . Narcotic abuse ([place]) 06/05/2011  . OBESITY 10/17/2010  . 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 SLEEP APNEA 01/24/2009  . BIPOLAR DISORDER UNSPECIFIED 05/05/2008  . HYPERLIPIDEMIA 03/30/2008  . ABUSE, ALCOHOL, UNSPECIFIED 04/26/2007  . TOBACCO ABUSE 12/25/2006    Past Surgical History:  Procedure Laterality Date  . APPLICATION OF WOUND VAC N/A 11/23/2018   Procedure: Application Of Wound Vac;  Surgeon: Kinsinger, Arta Bruce, MD;  Location: [place];  Service: General;  Laterality: N/A;  . COLECTOMY N/A 11/23/2018    Procedure: TOTAL COLECTOMY;  Surgeon: Kieth Brightly Arta Bruce, MD;  Location: [place];  Service: General;  Laterality: N/A;  . ILEOSTOMY N/A 11/23/2018   Procedure: ILEOSTOMY;  Surgeon: Kinsinger, Arta Bruce, MD;  Location: [place];  Service: General;  Laterality: N/A;  . ILEOSTOMY CLOSURE N/A 05/19/2019   Procedure: ILEOSTOMY REVERSAL WITH ILEOCOLIC AND COLORECTAL ANASTOMOSIS;  Surgeon: Kinsinger, Arta Bruce, MD;  Location: [facility];  Service: General;  Laterality: N/A;  . NM MYOCAR PERF WALL MOTION  01/22/2011   protocol: Persantine, moderate reversible inferior defect post stress EF 48%, high risk scan  . TRANSTHORACIC ECHOCARDIOGRAM  07/05/2004   EF=>55% normal study        Family History  Problem Relation Age of Onset  . COPD Mother   . Depression Mother   . Diabetes Mother   . Hyperlipidemia Mother   . Asthma Father   . Arthritis Father   . Diabetes Father   . Heart disease Father   . Hyperlipidemia Father   . Hypertension Father   . Hypertension Brother     Social History   Tobacco Use  . Smoking status: Current Every Day Smoker    Packs/day: 2.00    Years: 21.00    Pack years: 42.00    Types: Cigarettes  . Smokeless tobacco: Never Used  Vaping Use  . Vaping Use: Former  . Devices: Increased nicotine cravings  Substance Use Topics  . Alcohol use: No    Alcohol/week: 0.0 standard drinks    Comment: quit 2013  . Drug use: Yes    Types: Marijuana    Comment: 7-27    Home Medications Prior to Admission medications   Medication Sig Start Date End Date Taking? Authorizing Provider  atorvastatin (LIPITOR) 40 MG tablet Take 1 tablet (40 mg total) by mouth daily. 08/29/19   Anderson, Chelsey L, DO  Blood Glucose Monitoring Suppl (ONE TOUCH ULTRA 2) w/Device KIT USE TWO TIMES A DAY 12/21/19   Doristine Mango L, DO  celecoxib (CELEBREX) 100 MG capsule Take 1 capsule (100 mg total) by mouth 2 (two) times daily. 06/27/20   Wieters, Hallie C, PA-C  dicyclomine (BENTYL) 10 MG capsule  Take 1 capsule (10 mg total) by mouth 4 (four) times daily -  before meals and at bedtime. 10/06/19   Kathrene Alu, MD  diphenhydramine-acetaminophen (TYLENOL PM) 25-500 MG TABS tablet Take 3 tablets by mouth at bedtime.    [provider]  empagliflozin (JARDIANCE) 25 MG TABS tablet Take 25 mg by mouth daily. 12/21/19   Anderson, Chelsey L, DO  gabapentin (NEURONTIN) 300 MG capsule Take 300 mg by mouth daily.  06/20/16   [provider]  glucose blood (ONETOUCH ULTRA) test strip USE TO TEST TWO TIMES A DAY 04/06/20   Anderson, Chelsey L, DO  insulin glargine (LANTUS) 100 UNIT/ML Solostar Pen Inject 40 Units into the skin every morning. Start at 10 units daily then increase by 1 unit daily. 12/27/19   Zenia Resides, MD  Insulin Pen Needle 32G X 6 MM MISC 1 packet by Does not apply route 2 (two) times daily. 03/29/20   Anderson, Chelsey L, DO  Lancets (ONETOUCH DELICA PLUS [id number]) MISC USE TO TEST TWO TIMES A DAY 12/21/19   Anderson, Chelsey L, DO  LATUDA 120 MG TABS Take 1 tablet by mouth at bedtime. 12/07/19   [provider]  omeprazole (PRILOSEC) 20 MG capsule Take 2 capsules (40 mg total) by mouth at bedtime. 06/26/20   Anderson, Chelsey L, DO  ondansetron (ZOFRAN ODT) 4 MG disintegrating tablet Take 1 tablet (4 mg total) by mouth every 8 (eight) hours as needed for nausea or vomiting. 06/27/20   Wieters, Hallie C, PA-C  potassium chloride SA (KLOR-CON) 20 MEQ tablet Take 1 tablet (20 mEq total) by mouth 2 (two) times daily. 07/01/20   Horton, Barbette Hair, MD  propranolol (INDERAL) 40 MG tablet Take 40 mg by mouth 2 (two) times daily. 12/05/19   [provider]  tiZANidine (ZANAFLEX) 4 MG tablet Take 1 tablet (4 mg total) by mouth every 6 (six) hours as needed for muscle spasms. 06/27/20   Wieters, Hallie C, PA-C  traZODone (DESYREL) 100 MG tablet Take 100 mg by mouth at bedtime. 12/05/19   [provider]  VYVANSE 20 MG capsule Take 20 mg by mouth daily with  breakfast.  06/28/18   [provider]  VYVANSE 50 MG capsule Take 50 mg by mouth daily with breakfast.  06/28/18   [provider]  PROAIR HFA 108 (90 Base) MCG/ACT inhaler INHALE 2 PUFFS BY MOUTH EVERY 6 HOURS AS NEEDED FOR WHEEZE OR SHORTNESS OF BREATH 03/08/19   Lovenia Kim, MD    Allergies    Metformin and related and Penicillins  Review of Systems   Review of Systems  Musculoskeletal: Positive for arthralgias, gait problem and joint swelling.  Skin: Negative for color  change, rash and wound.  Neurological: Negative for weakness and numbness.    Physical Exam Updated Vital Signs BP (!) 131/91 (BP Location: Right Arm)   Pulse (!) 110   Temp 98.8 F (37.1 C) (Oral)   Resp 17   Ht $R'5\' 8"'GZ$  (1.727 m)   Wt 108 kg   SpO2 100%   BMI 36.19 kg/m   Physical Exam Vitals and nursing note reviewed.  Constitutional:      General: He is not in acute distress.    Appearance: He is well-developed. He is not diaphoretic.  HENT:     Head: Normocephalic and atraumatic.  Cardiovascular:     Pulses: Normal pulses.  Pulmonary:     Effort: Pulmonary effort is normal.  Musculoskeletal:        General: Tenderness present. No swelling or deformity.     Comments: Tenderness with palpation along the lateral right ankle and fifth metatarsal.  No pain of proximal fibula.  DP pulse present, sensation intact.  No appreciable swelling or ecchymosis.  Skin:    General: Skin is warm and dry.     Capillary Refill: Capillary refill takes less than 2 seconds.     Findings: No erythema or rash.  Neurological:     Mental Status: He is alert and oriented to person, place, and time.     Sensory: No sensory deficit.     Motor: No weakness.  Psychiatric:        Behavior: Behavior normal.     ED Results / Procedures / Treatments   Labs (all labs ordered are listed, but only abnormal results are displayed) Labs Reviewed - No data to display  EKG None  Radiology DG Ankle Complete  Right  Result Date: 07/23/2020 CLINICAL DATA:  Fall on ankle. EXAM: RIGHT ANKLE - COMPLETE 3+ VIEW COMPARISON:  None. FINDINGS: There is no evidence of fracture, dislocation, or joint effusion. There is no evidence of arthropathy or other focal bone abnormality. There is a small Achilles tendon enthesophyte. No focal soft tissue abnormality. IMPRESSION: No fracture or acute osseous abnormality of the right ankle. Electronically Signed   By: Keith Rake M.D.   On: 07/23/2020 18:43   DG Foot Complete Right  Result Date: 07/23/2020 CLINICAL DATA:  Lateral foot pain after injury yesterday EXAM: RIGHT FOOT COMPLETE - 3+ VIEW COMPARISON:  Ankle radiograph earlier today. FINDINGS: There is no evidence of fracture or dislocation. Normal alignment and joint spaces. There is no evidence of arthropathy or other focal bone abnormality. Soft tissues are unremarkable. IMPRESSION: Negative radiographs of the right foot. Electronically Signed   By: Keith Rake M.D.   On: 07/23/2020 19:46    Procedures Procedures (including critical care time)  Medications Ordered in ED Medications - No data to display  ED Course  I have reviewed the triage vital signs and the nursing notes.  Pertinent labs & imaging results that were available during my care of the patient were reviewed by me and considered in my medical decision making (see chart for details).  Clinical Course as of Jul 23 1949  Mon Jul 23, 7565  39 -year-old male with right foot and ankle pain after injury while wrestling yesterday.  On exam found to have tenderness along the right lateral ankle and fifth metatarsal.  X-rays of the ankle and foot are unremarkable.  Patient placed in ankle ASO and given crutches.  Recommend ice and elevate, Motrin Tylenol, follow-up with podiatry if pain is not improving.   [  LM]    Clinical Course User Index [LM] Roque Lias   MDM Rules/Calculators/A&P                          Final Clinical  Impression(s) / ED Diagnoses Final diagnoses:  Sprain of right ankle, unspecified ligament, initial encounter    Rx / DC Orders ED Discharge Orders    None       Tacy Learn, PA-C 07/23/20 1950    Hayden Rasmussen, MD 07/24/20 1039

## 2020-07-24 ENCOUNTER — Ambulatory Visit: Payer: Medicare Other | Admitting: Orthopaedic Surgery

## 2020-07-27 ENCOUNTER — Ambulatory Visit: Payer: Medicare Other | Admitting: Student in an Organized Health Care Education/Training Program

## 2020-07-29 ENCOUNTER — Other Ambulatory Visit: Payer: Self-pay

## 2020-07-29 ENCOUNTER — Encounter (HOSPITAL_COMMUNITY): Payer: Self-pay | Admitting: Emergency Medicine

## 2020-07-29 ENCOUNTER — Emergency Department (HOSPITAL_COMMUNITY)
Admission: EM | Admit: 2020-07-29 | Discharge: 2020-07-29 | Disposition: A | Discharge status: Home / Self Care | Payer: Medicare Other | Attending: Emergency Medicine | Admitting: Emergency Medicine

## 2020-07-29 DIAGNOSIS — R32 Unspecified urinary incontinence: Secondary | ICD-10-CM | POA: Diagnosis not present

## 2020-07-29 DIAGNOSIS — R5383 Other fatigue: Secondary | ICD-10-CM | POA: Insufficient documentation

## 2020-07-29 DIAGNOSIS — Z5321 Procedure and treatment not carried out due to patient leaving prior to being seen by health care provider: Secondary | ICD-10-CM | POA: Diagnosis not present

## 2020-07-29 DIAGNOSIS — R630 Anorexia: Secondary | ICD-10-CM | POA: Insufficient documentation

## 2020-07-29 LAB — CBC
HCT: 44.7 % (ref 39.0–52.0)
Hemoglobin: 15.1 g/dL (ref 13.0–17.0)
MCH: 27.6 pg (ref 26.0–34.0)
MCHC: 33.8 g/dL (ref 30.0–36.0)
MCV: 81.7 fL (ref 80.0–100.0)
Platelets: 186 10*3/uL (ref 150–400)
RBC: 5.47 MIL/uL (ref 4.22–5.81)
RDW: 14.8 % (ref 11.5–15.5)
WBC: 16 10*3/uL — ABNORMAL HIGH (ref 4.0–10.5)
nRBC: 0 % (ref 0.0–0.2)

## 2020-07-29 LAB — BASIC METABOLIC PANEL
Anion gap: 12 (ref 5–15)
BUN: 7 mg/dL (ref 6–20)
CO2: 25 mmol/L (ref 22–32)
Calcium: 8.8 mg/dL — ABNORMAL LOW (ref 8.9–10.3)
Chloride: 94 mmol/L — ABNORMAL LOW (ref 98–111)
Creatinine, Ser: 0.85 mg/dL (ref 0.61–1.24)
GFR, Estimated: >60 mL/min (ref >60)
Glucose, Bld: 442 mg/dL — ABNORMAL HIGH (ref 70–99)
Potassium: 3.4 mmol/L — ABNORMAL LOW (ref 3.5–5.1)
Sodium: 131 mmol/L — ABNORMAL LOW (ref 135–145)

## 2020-07-29 NOTE — ED Triage Notes (Signed)
Pt c/o of loss of appetite, episodes of incontinence, and fatigue x 1 week

## 2020-08-03 ENCOUNTER — Ambulatory Visit (INDEPENDENT_AMBULATORY_CARE_PROVIDER_SITE_OTHER): Payer: Medicare Other | Admitting: Student in an Organized Health Care Education/Training Program

## 2020-08-03 ENCOUNTER — Other Ambulatory Visit: Payer: Self-pay

## 2020-08-03 DIAGNOSIS — Z794 Long term (current) use of insulin: Secondary | ICD-10-CM

## 2020-08-03 DIAGNOSIS — F419 Anxiety disorder, unspecified: Secondary | ICD-10-CM | POA: Diagnosis not present

## 2020-08-03 DIAGNOSIS — E1165 Type 2 diabetes mellitus with hyperglycemia: Secondary | ICD-10-CM

## 2020-08-03 DIAGNOSIS — R109 Unspecified abdominal pain: Secondary | ICD-10-CM | POA: Diagnosis not present

## 2020-08-03 NOTE — Progress Notes (Signed)
    SUBJECTIVE:   CHIEF COMPLAINT / HPI: hosp f/u  Abdominal pain- 2-3 weeks of epigastric pain similar to pain he felt with past surgical pains. Decreased intake of foods and fluids for 2 weeks with no intake for a week. Forcing himself to eat and drink now for the last week but the pain is getting worse. Last BM this morning and was normal. Denies constipation, nausea/vomiting. No blood in stool. Pain constant and sharp, stabbing. Is escalating since onset. Early satiety. Feels better when haven't eaten in a long time. Pain worsens with eating.  Hypokalemia- 3.4 at last ED visit 07/29/2020, was 2.6 on 06/30/2020  DM- 45u daily. 235-300 daily. Drinking more water and sugar free drinks.   Anxiety- previously seen at [facility]. Discharged due to alcohol and other medications in system. vyvanse out for 3 months. Requesting refill today.  OBJECTIVE:   BP 128/78   Pulse (!) 108   Ht $R'5\' 8"'AF$  (1.727 m)   Wt 233 lb 3.2 oz (105.8 kg)   SpO2 97%   BMI 35.46 kg/m   General: NAD, pleasant, able to participate in exam Abdomen: soft, acute tenderness in epigastric/periumbilical region, nondistended, no hepatic or splenomegaly, +BS, multiple healed surgical scars Extremities: no edema. WWP. Skin: warm and dry, no rashes noted Neuro: alert and oriented, no focal deficits Psych: anxious affect and mood  ASSESSMENT/PLAN:   Abdominal pain Acute worsening with anorexia over 2-3 weeks without N/V/diarrhea/constipation. Complicated abdominal surgical history and poorly controlled diabetes. High level of concern that patient could have bowel obstruction or ischemia.  - recommended patient go to ED immediately for evaluation and CT. Patient declined as he promised his child that he would do a hayride with him tonight. He said that he would go on Monday or sooner if it gets worse.   Type 2 diabetes mellitus with hyperglycemia (HCC) Continue current regimen and increasing insulin dose. May need to  change to BID dosing soon if continues to need higher doses  Anxiety Patient previously on vyvanse. Patient states that he was discharged from pain clinic/psychiatry due to having alcohol and other drugs in his system. Has been without vyvanse for 3 months and thinks he is currently going through withdrawals. Wants me to refill today.  - provided patient with local psychiatry contact information but strongly encouraged him to check with his insurance for who would be covered best.     [name], [place]

## 2020-08-03 NOTE — Assessment & Plan Note (Signed)
Acute worsening with anorexia over 2-3 weeks without N/V/diarrhea/constipation. Complicated abdominal surgical history and poorly controlled diabetes. High level of concern that patient could have bowel obstruction or ischemia.  - recommended patient go to ED immediately for evaluation and CT. Patient declined as he promised his child that he would do a hayride with him tonight. He said that he would go on Monday or sooner if it gets worse.

## 2020-08-03 NOTE — Patient Instructions (Addendum)
It was a pleasure to see you today!  To summarize our discussion for this visit:  Some local to you psychiatrists  [facility] health behavioral health [phone]  [facility]wyndolyn Saxon [facility] [place] [phone]  Please go directly to the ED at your earliest time able to get evaluated for your abdominal pain. I think you need to get a CT scan of your abdomen ASAP   Some additional health maintenance measures we should update are: Health Maintenance Due  Topic Date Due  . OPHTHALMOLOGY EXAM  Never done  . FOOT EXAM  09/11/2019  . URINE MICROALBUMIN  09/11/2019  . INFLUENZA VACCINE  05/20/2020  .    Call the clinic at [phone] if your symptoms worsen or you have any concerns.   Thank you for allowing me to take part in your care,  Dr. Doristine Mango

## 2020-08-04 DIAGNOSIS — F419 Anxiety disorder, unspecified: Secondary | ICD-10-CM | POA: Insufficient documentation

## 2020-08-04 NOTE — Assessment & Plan Note (Signed)
Patient previously on vyvanse. Patient states that he was discharged from pain clinic/psychiatry due to having alcohol and other drugs in his system. Has been without vyvanse for 3 months and thinks he is currently going through withdrawals. Wants me to refill today.  - provided patient with local psychiatry contact information but strongly encouraged him to check with his insurance for who would be covered best.

## 2020-08-04 NOTE — Assessment & Plan Note (Signed)
Continue current regimen and increasing insulin dose. May need to change to BID dosing soon if continues to need higher doses

## 2020-08-09 ENCOUNTER — Encounter: Payer: Self-pay | Admitting: Radiology

## 2020-09-26 ENCOUNTER — Emergency Department (HOSPITAL_COMMUNITY)
Admission: EM | Admit: 2020-09-26 | Discharge: 2020-09-26 | Disposition: A | Discharge status: Home / Self Care | Payer: Medicare Other | Attending: Emergency Medicine | Admitting: Emergency Medicine

## 2020-09-26 ENCOUNTER — Emergency Department (HOSPITAL_COMMUNITY): Payer: Medicare Other

## 2020-09-26 ENCOUNTER — Other Ambulatory Visit: Payer: Self-pay

## 2020-09-26 DIAGNOSIS — K436 Other and unspecified ventral hernia with obstruction, without gangrene: Secondary | ICD-10-CM

## 2020-09-26 DIAGNOSIS — F17210 Nicotine dependence, cigarettes, uncomplicated: Secondary | ICD-10-CM | POA: Diagnosis not present

## 2020-09-26 DIAGNOSIS — K6389 Other specified diseases of intestine: Secondary | ICD-10-CM | POA: Diagnosis not present

## 2020-09-26 DIAGNOSIS — E1165 Type 2 diabetes mellitus with hyperglycemia: Secondary | ICD-10-CM | POA: Insufficient documentation

## 2020-09-26 DIAGNOSIS — Z794 Long term (current) use of insulin: Secondary | ICD-10-CM | POA: Insufficient documentation

## 2020-09-26 DIAGNOSIS — R109 Unspecified abdominal pain: Secondary | ICD-10-CM | POA: Diagnosis present

## 2020-09-26 DIAGNOSIS — K760 Fatty (change of) liver, not elsewhere classified: Secondary | ICD-10-CM | POA: Diagnosis not present

## 2020-09-26 LAB — URINALYSIS, ROUTINE W REFLEX MICROSCOPIC
Bacteria, UA: NONE SEEN
Bilirubin Urine: NEGATIVE
Glucose, UA: >=500 mg/dL — AB
Hgb urine dipstick: NEGATIVE
Ketones, ur: NEGATIVE mg/dL
Leukocytes,Ua: NEGATIVE
Nitrite: NEGATIVE
Protein, ur: NEGATIVE mg/dL
Specific Gravity, Urine: 1.037 — ABNORMAL HIGH (ref 1.005–1.030)
pH: 6 (ref 5.0–8.0)

## 2020-09-26 LAB — COMPREHENSIVE METABOLIC PANEL
ALT: 25 U/L (ref 0–44)
AST: 18 U/L (ref 15–41)
Albumin: 4 g/dL (ref 3.5–5.0)
Alkaline Phosphatase: 87 U/L (ref 38–126)
Anion gap: 10 (ref 5–15)
BUN: 6 mg/dL (ref 6–20)
CO2: 27 mmol/L (ref 22–32)
Calcium: 9 mg/dL (ref 8.9–10.3)
Chloride: 99 mmol/L (ref 98–111)
Creatinine, Ser: 0.73 mg/dL (ref 0.61–1.24)
GFR, Estimated: >60 mL/min (ref >60)
Glucose, Bld: 297 mg/dL — ABNORMAL HIGH (ref 70–99)
Potassium: 3.3 mmol/L — ABNORMAL LOW (ref 3.5–5.1)
Sodium: 136 mmol/L (ref 135–145)
Total Bilirubin: 0.5 mg/dL (ref 0.3–1.2)
Total Protein: 7.5 g/dL (ref 6.5–8.1)

## 2020-09-26 LAB — CBC
HCT: 45.2 % (ref 39.0–52.0)
Hemoglobin: 15 g/dL (ref 13.0–17.0)
MCH: 28 pg (ref 26.0–34.0)
MCHC: 33.2 g/dL (ref 30.0–36.0)
MCV: 84.3 fL (ref 80.0–100.0)
Platelets: 196 10*3/uL (ref 150–400)
RBC: 5.36 MIL/uL (ref 4.22–5.81)
RDW: 14.4 % (ref 11.5–15.5)
WBC: 9.3 10*3/uL (ref 4.0–10.5)
nRBC: 0 % (ref 0.0–0.2)

## 2020-09-26 LAB — LIPASE, BLOOD: Lipase: 32 U/L (ref 11–51)

## 2020-09-26 LAB — CBG MONITORING, ED: Glucose-Capillary: 309 mg/dL — ABNORMAL HIGH (ref 70–99)

## 2020-09-26 MED ORDER — ONDANSETRON HCL 4 MG/2ML IJ SOLN
4.0000 mg | Freq: Once | INTRAMUSCULAR | Status: AC
  Administered 2020-09-26: 4 mg via INTRAVENOUS
  Filled 2020-09-26: qty 2

## 2020-09-26 MED ORDER — MORPHINE SULFATE (PF) 4 MG/ML IV SOLN
4.0000 mg | Freq: Once | INTRAVENOUS | Status: AC
  Administered 2020-09-26: 4 mg via INTRAVENOUS
  Filled 2020-09-26: qty 1

## 2020-09-26 MED ORDER — IOHEXOL 300 MG/ML  SOLN
100.0000 mL | Freq: Once | INTRAMUSCULAR | Status: AC | PRN
  Administered 2020-09-26: 100 mL via INTRAVENOUS

## 2020-09-26 NOTE — ED Provider Notes (Signed)
[facility] EMERGENCY DEPARTMENT Provider Note   CSN: [id number] Arrival date & time: 09/26/20  1227     History Chief Complaint  Patient presents with  . Abdominal Pain    [name] is a 39 y.o. 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  Patient has multiple surgical scars and reports pain and "knot" between his ileostomy scars just right to his umbilicus.  States the knot is not coming and going.  He is having pain in this area and constipation for 3 days.  Reports usually he has diarrhea daily so constipation is abnormal for him.  He reports nausea early in the week, however is having no vomiting.  Denies fevers or chills, urinary symptoms.  He has treated his symptoms with ibuprofen and Goody's powder.  The history is provided by the patient.       Past Medical History:  Diagnosis Date  . Allergy   . Anxiety   . 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 Bipolar disorder ([place])    On disability  . Depression   . Diabetes mellitus without complication ([place])    per patient : under control with diet, does not monitor cbg at home   . Headache(784.0)    daily headaches  . Hyperlipidemia   . Neuromuscular disorder ([place])    Beckers muscular dystrophy  . Obesity   . Sleep apnea    Does not tolerate CPAP    Patient Active Problem List   Diagnosis Date Noted  . Anxiety   . Shoulder injury, subsequent encounter 06/29/2020  . High priority for COVID-19 virus vaccination 03/29/2020  . Type 2 diabetes mellitus with hyperglycemia ([place]) 03/01/2020  . Abdominal pain 11/22/2018  . Memory difficulty 10/04/2018  . Erectile dysfunction 01/29/2018  . Neck pain 11/12/2016  . Back pain 10/25/2016  . Allergic rhinitis 04/28/2016  . GERD (gastroesophageal reflux disease) 03/21/2013  . Elevated BP  04/25/2012  . Narcotic abuse ([place]) 06/05/2011  . OBESITY 10/17/2010  . 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 SLEEP APNEA 01/24/2009  . BIPOLAR DISORDER UNSPECIFIED 05/05/2008  . HYPERLIPIDEMIA 03/30/2008  . ABUSE, ALCOHOL, UNSPECIFIED 04/26/2007  . TOBACCO ABUSE 12/25/2006    Past Surgical History:  Procedure Laterality Date  . APPLICATION OF WOUND VAC N/A 11/23/2018   Procedure: Application Of Wound Vac;  Surgeon: Kinsinger, Arta Bruce, MD;  Location: [place];  Service: General;  Laterality: N/A;  . COLECTOMY N/A 11/23/2018   Procedure: TOTAL COLECTOMY;  Surgeon: Kieth Brightly Arta Bruce, MD;  Location: [place];  Service: General;  Laterality: N/A;  . ILEOSTOMY N/A 11/23/2018   Procedure: ILEOSTOMY;  Surgeon: Kinsinger, Arta Bruce, MD;  Location: [place];  Service: General;  Laterality: N/A;  . ILEOSTOMY CLOSURE N/A 05/19/2019   Procedure: ILEOSTOMY REVERSAL WITH ILEOCOLIC AND COLORECTAL ANASTOMOSIS;  Surgeon: Kinsinger, Arta Bruce, MD;  Location: [facility];  Service: General;  Laterality: N/A;  . NM MYOCAR PERF WALL MOTION  01/22/2011   protocol: Persantine, moderate reversible inferior defect post stress EF 48%, high risk scan  . TRANSTHORACIC ECHOCARDIOGRAM  07/05/2004   EF=>55% normal study        Family History  Problem Relation Age of Onset  . COPD Mother   . Depression Mother   . Diabetes Mother   . Hyperlipidemia Mother   . Asthma Father   .  Arthritis Father   . Diabetes Father   . Heart disease Father   . Hyperlipidemia Father   . Hypertension Father   . Hypertension Brother     Social History   Tobacco Use  . Smoking status: Current Every Day Smoker    Packs/day: 2.00    Years: 21.00    Pack years: 42.00    Types: Cigarettes  . Smokeless tobacco: Never Used  Vaping Use  . Vaping Use: Former  . Devices: Increased nicotine cravings  Substance Use Topics  . Alcohol use: No    Alcohol/week: 0.0 standard drinks    Comment: quit 2013  . Drug use: Yes    Types: Marijuana     Comment: 7-27    Home Medications Prior to Admission medications   Medication Sig Start Date End Date Taking? Authorizing Provider  diphenhydramine-acetaminophen (TYLENOL PM) 25-500 MG TABS tablet Take 3 tablets by mouth at bedtime.   Yes [provider]  empagliflozin (JARDIANCE) 25 MG TABS tablet Take 25 mg by mouth daily. 12/21/19  Yes Anderson, Chelsey L, DO  gabapentin (NEURONTIN) 300 MG capsule Take 300 mg by mouth daily.  06/20/16  Yes [provider]  insulin glargine (LANTUS) 100 UNIT/ML Solostar Pen Inject 40 Units into the skin every morning. Start at 10 units daily then increase by 1 unit daily. Patient taking differently: Inject 45 Units into the skin every morning. Start at 10 units daily then increase by 2 unit daily. 12/27/19  Yes Hensel, Jamal Collin, MD  LATUDA 120 MG TABS Take 1 tablet by mouth at bedtime. 12/07/19  Yes [provider]  omeprazole (PRILOSEC) 20 MG capsule Take 2 capsules (40 mg total) by mouth at bedtime. 06/26/20  Yes Anderson, Chelsey L, DO  traZODone (DESYREL) 100 MG tablet Take 100 mg by mouth at bedtime. 12/05/19  Yes [provider]  VYVANSE 20 MG capsule Take 20 mg by mouth daily with breakfast.  06/28/18  Yes [provider]  VYVANSE 50 MG capsule Take 50 mg by mouth daily with breakfast.  06/28/18  Yes [provider]  atorvastatin (LIPITOR) 40 MG tablet Take 1 tablet (40 mg total) by mouth daily. Patient not taking: Reported on 09/26/2020 08/29/19   Doristine Mango L, DO  Blood Glucose Monitoring Suppl (ONE TOUCH ULTRA 2) w/Device KIT USE TWO TIMES A DAY 12/21/19   Doristine Mango L, DO  celecoxib (CELEBREX) 100 MG capsule Take 1 capsule (100 mg total) by mouth 2 (two) times daily. Patient not taking: Reported on 09/26/2020 06/27/20   Wieters, Hallie C, PA-C  dicyclomine (BENTYL) 10 MG capsule Take 1 capsule (10 mg total) by mouth 4 (four) times daily -  before meals and at bedtime. Patient not taking: Reported on  09/26/2020 10/06/19   Kathrene Alu, MD  glucose blood (ONETOUCH ULTRA) test strip USE TO TEST TWO TIMES A DAY 04/06/20   Anderson, Chelsey L, DO  Insulin Pen Needle 32G X 6 MM MISC 1 packet by Does not apply route 2 (two) times daily. 03/29/20   Anderson, Chelsey L, DO  Lancets (ONETOUCH DELICA PLUS [id number]) MISC USE TO TEST TWO TIMES A DAY 12/21/19   Anderson, Chelsey L, DO  ondansetron (ZOFRAN ODT) 4 MG disintegrating tablet Take 1 tablet (4 mg total) by mouth every 8 (eight) hours as needed for nausea or vomiting. Patient not taking: Reported on 09/26/2020 06/27/20   Wieters, Hallie C, PA-C  potassium chloride SA (KLOR-CON) 20 MEQ tablet Take 1  tablet (20 mEq total) by mouth 2 (two) times daily. Patient not taking: Reported on 09/26/2020 07/01/20   Horton, Barbette Hair, MD  propranolol (INDERAL) 40 MG tablet Take 40 mg by mouth 2 (two) times daily. Patient not taking: Reported on 09/26/2020 12/05/19   [provider]  tiZANidine (ZANAFLEX) 4 MG tablet Take 1 tablet (4 mg total) by mouth every 6 (six) hours as needed for muscle spasms. Patient not taking: Reported on 09/26/2020 06/27/20   Wieters, Hallie C, PA-C  PROAIR HFA 108 [phone] Base) MCG/ACT inhaler INHALE 2 PUFFS BY MOUTH EVERY 6 HOURS AS NEEDED FOR WHEEZE OR SHORTNESS OF BREATH 03/08/19   Lovenia Kim, MD    Allergies    Metformin and related and Penicillins  Review of Systems   Review of Systems  Gastrointestinal: Positive for abdominal pain, constipation and nausea. Negative for vomiting.  All other systems reviewed and are negative.   Physical Exam Updated Vital Signs BP 130/84   Pulse 98   Temp 98.7 F (37.1 C) (Oral)   Resp 19   Ht _0  (1.651 m)   Wt 108 kg   SpO2 98%   BMI 39.61 kg/m   Physical Exam Vitals and nursing note reviewed.  Constitutional:      General: He is not in acute distress.    Appearance: He is well-developed. He is not ill-appearing.  HENT:     Head: Normocephalic and atraumatic.  Eyes:      Conjunctiva/sclera: Conjunctivae normal.  Cardiovascular:     Rate and Rhythm: Normal rate and regular rhythm.  Pulmonary:     Effort: Pulmonary effort is normal. No respiratory distress.     Breath sounds: Normal breath sounds.  Abdominal:     General: Abdomen is flat. A surgical scar is present. Bowel sounds are increased.     Palpations: Abdomen is soft.     Tenderness: There is abdominal tenderness. There is no guarding or rebound.     Comments: There is large midline ventral surgical scar as well as additional surgical scars to the right of the midline in the mid abdomen.  He has focal tenderness between the 2 scars that are right of the midline.  Possible palpable hernia or bulge, abdominal wall defect is noted to right lower abdomen.  There is small easily reducible umbilical hernia noted that is nontender, palpable abd wall defect over midline abdomen superior to umbilicus.  Skin:    General: Skin is warm.  Neurological:     Mental Status: He is alert.  Psychiatric:        Behavior: Behavior normal.     ED Results / Procedures / Treatments   Labs (all labs ordered are listed, but only abnormal results are displayed) Labs Reviewed  COMPREHENSIVE METABOLIC PANEL - Abnormal; Notable for the following components:      Result Value   Potassium 3.3 (*)    Glucose, Bld 297 (*)    All other components within normal limits  URINALYSIS, ROUTINE W REFLEX MICROSCOPIC - Abnormal; Notable for the following components:   Specific Gravity, Urine 1.037 (*)    Glucose, UA >=500 (*)    All other components within normal limits  CBG MONITORING, ED - Abnormal; Notable for the following components:   Glucose-Capillary 309 (*)    All other components within normal limits  LIPASE, BLOOD  CBC  CBG MONITORING, ED    EKG None  Radiology CT Abdomen Pelvis W Contrast  Result Date: 09/26/2020 CLINICAL  DATA:  Abdominal pain EXAM: CT ABDOMEN AND PELVIS WITH CONTRAST TECHNIQUE: Multidetector  CT imaging of the abdomen and pelvis was performed using the standard protocol following bolus administration of intravenous contrast. CONTRAST:  180m OMNIPAQUE IOHEXOL 300 MG/ML  SOLN COMPARISON:  None. FINDINGS: Lower chest: Lung bases are clear. No effusions. Heart is normal size. Hepatobiliary: Diffuse low-density throughout the liver compatible with fatty infiltration. No focal abnormality. Gallbladder unremarkable. Pancreas: No focal abnormality or ductal dilatation. Spleen: No focal abnormality.  Normal size. Adrenals/Urinary Tract: No adrenal abnormality. No focal renal abnormality. No stones or hydronephrosis. Urinary bladder is unremarkable. Stomach/Bowel: Small bowel loops are noted within 4 ventral hernias. And umbilical hernia, supraumbilical midline hernia, and 2 right abdominal wall hernias. Within the inferior right abdominal hernia in the lower abdomen, the bowel loops appears inflamed and thick walled suggesting the possibility of incarceration or strangulation. No evidence of bowel obstruction. Stomach and large bowel decompressed. Postoperative changes seen in the region of the cecum/right colon and sigmoid colon. Vascular/Lymphatic: Aortic atherosclerosis. No evidence of aneurysm or adenopathy. Reproductive: No visible focal abnormality. Other: No free fluid or free air. Musculoskeletal: No acute bony abnormality. IMPRESSION: Four ventral wall hernias as described above, all containing a small bowel loop. The inferior right lower abdominal wall hernia near the level of the pelvic brim contains a small bowel loop which appears thick walled with some surrounding stranding/haziness suggesting the possibility of strangulation/incarceration. No evidence of bowel obstruction. Hepatic steatosis. Aortic atherosclerosis. These results were called by telephone at the time of interpretation on 09/26/2020 at 5:07 pm to provider Dr. GRoxanne Mins who verbally acknowledged these results. Electronically Signed   By:  KRolm BaptiseM.D.   On: 09/26/2020 17:11    Procedures Hernia reduction  Date/Time: 09/26/2020 6:49 PM Performed by: Jesyka Slaght, JMartiniqueN, PA-C Authorized by: Meldon Hanzlik, JMartiniqueN, PA-C  Consent: Verbal consent obtained. Consent given by: patient Imaging studies: imaging studies available Local anesthesia used: no  Anesthesia: Local anesthesia used: no  Sedation: Patient sedated: no  Patient tolerance: patient tolerated the procedure well with no immediate complications Comments: Patient was given 460mmorphine for pain control. Hernia was localized with palpation and guidance of CT image. Gentle steady pressure applied, hernia was felt to reduce by both myself and patient. Patient had immediate improvement in symptoms. Patient monitored for an additional hour.    (including critical care time)  Medications Ordered in ED Medications  iohexol (OMNIPAQUE) 300 MG/ML solution 100 mL (100 mLs Intravenous Contrast Given 09/26/20 1638)  morphine 4 MG/ML injection 4 mg (4 mg Intravenous Given 09/26/20 1719)  ondansetron (ZOFRAN) injection 4 mg (4 mg Intravenous Given 09/26/20 1719)    ED Course  I have reviewed the triage vital signs and the nursing notes.  Pertinent labs & imaging results that were available during my care of the patient were reviewed by me and considered in my medical decision making (see chart for details).  Clinical Course as of Sep 26 1853  Wed Sep 26, 2020  1733 Patient with possible strangulated/incarcerated hernia on CT to inferior-most RLQ ventral hernia. Patient was medicated with morphine for pain, hernia seems reduced after gentle pressure. Patient reports immediate relief. Will continue to monitor. Attending, Dr. GlRoxanne Minsmade aware and evaluated patient.   [JR]  1836 Patient re-evaluated, reports symptoms remain resolved. Reports he has already made an appointment with his surgeon for follow up.    [JR]    Clinical Course User Index [JR] Myrick Mcnairy, JoMartinique,  PA-C   MDM Rules/Calculators/A&P                          Patient with hx of multiple bowel surgeries, s/p ileostomy reversal, presenting with abdominal pain and bulge for the last 3 days.  He is overall well-appearing and in no acute distress.  Slightly hypertensive though afebrile.  Laboratory work-up is reassuring, no leukocytosis.  He is hyperglycemic though no other acute significant electrolyte abnormalities.  CT scan of the abdomen is obtained due to patient's high risk bowel obstruction and hernia considering multiple abdominal surgeries.  He has multiple ventral hernias though inferior most right lower quadrant ventral hernia is concerning for incarceration versus strangulation.  Patient was given morphine for pain, heart hernia was successfully and easily reduced with immediate pain reduction.  Patient was monitored for additional hour following reduction and remains pain-free.  Patient discussed with and evaluated by Dr. Roxanne Mins.  Patient will be discharged home.  He states he has already scheduled appointment with his surgeon for close follow-up.  He is instructed of very strict return precautions, including recurrent pain, worsening pain, fever, uncontrollable vomiting, or absence of bowel movements.  Discussed results, findings, treatment and follow up. Patient advised of return precautions. Patient verbalized understanding and agreed with plan.  Final Clinical Impression(s) / ED Diagnoses Final diagnoses:  Incarcerated ventral hernia    Rx / DC Orders ED Discharge Orders    None       Azuri Bozard, [place] N, PA-C 76/54/65 0354    Delora Fuel, MD 65/68/12 [phone]

## 2020-09-26 NOTE — Discharge Instructions (Addendum)
Please follow up as soon as possible with your surgeon. Return to the ED if symptoms return, if you stop having bowel movements, uncontrollable vomiting, severely worsening pain.

## 2020-09-26 NOTE — ED Triage Notes (Signed)
Pt c/o abdominal pain x2 days. Pt states there is a knot between two scars from prior abdominal surgeries that is new.

## 2020-10-05 DIAGNOSIS — K432 Incisional hernia without obstruction or gangrene: Secondary | ICD-10-CM | POA: Diagnosis not present

## 2020-10-23 ENCOUNTER — Ambulatory Visit: Payer: Self-pay | Admitting: General Surgery

## 2020-11-13 NOTE — Patient Instructions (Signed)
DUE TO COVID-19 ONLY ONE VISITOR IS ALLOWED TO COME WITH YOU AND STAY IN THE WAITING ROOM ONLY DURING PRE OP AND PROCEDURE DAY OF SURGERY. THE 1 VISITOR  MAY VISIT WITH YOU AFTER SURGERY IN YOUR PRIVATE ROOM DURING VISITING HOURS ONLY!  YOU NEED TO HAVE A COVID 19 TEST ON_1/31______ @_______ , THIS TEST MUST BE DONE BEFORE SURGERY,  COVID TESTING SITE [address] [place]  [id number], IT IS ON THE RIGHT GOING OUT [address] APPROXIMATELY  2 MINUTES PAST [employer] ON THE RIGHT. ONCE YOUR COVID TEST IS COMPLETED,  PLEASE BEGIN THE QUARANTINE INSTRUCTIONS AS OUTLINED IN YOUR HANDOUT.                [name]    Your procedure is scheduled on: 11/22/20   Report to [facility]  Entrance   Report to Short stay at 5:30 AM     Call this number if you have problems the morning of surgery [place], NO [place].   No food after midnight.    You may have clear liquid until 4:30 AM.    At 4:00 AM drink pre surgery drink.   Nothing by mouth after 4:30 AM.   Take these medicines the morning of surgery with A SIP OF WATER: Buspirone, Gabapentin, Propranolol, Prilosec                      How to Manage Your Diabetes Before and After Surgery  Why is it important to control my blood sugar before and after surgery? . Improving blood sugar levels before and after surgery helps healing and can limit problems. . A way of improving blood sugar control is eating a healthy diet by: o  Eating less sugar and carbohydrates o  Increasing activity/exercise o  Talking with your doctor about reaching your blood sugar goals . High blood sugars (greater than 180 mg/dL) can raise your risk of infections and slow your recovery, so you will need to focus on controlling your diabetes during the weeks before surgery. . Make sure that the doctor who takes care of your diabetes knows about your  planned surgery including the date and location.  How do I manage my blood sugar before surgery? . Check your blood sugar at least 4 times a day, starting 2 days before surgery, to make sure that the level is not too high or low. o Check your blood sugar the morning of your surgery when you wake up and every 2 hours until you get to the Short Stay unit. . If your blood sugar is less than 70 mg/dL, you will need to treat for low blood sugar: o Do not take insulin. o Treat a low blood sugar (less than 70 mg/dL) with  cup of clear juice (cranberry or apple), 4 glucose tablets, OR glucose gel. o Recheck blood sugar in 15 minutes after treatment (to make sure it is greater than 70 mg/dL). If your blood sugar is not greater than 70 mg/dL on recheck, call [phone] for further instructions. . Report your blood sugar to the short stay nurse when you get to Short Stay.  . If you are admitted to the hospital after surgery: o Your blood sugar will be checked by the staff and you will probably be given insulin after surgery (instead of oral diabetes medicines) to make sure  you have good blood sugar levels. o The goal for blood sugar control after surgery is 80-180 mg/dL.   WHAT DO I DO ABOUT MY DIABETES MEDICATION?  Marland Kitchen Do not take oral diabetes medicines (pills) the morning of surgery.  . THE NIGHT BEFORE SURGERY, take  0   units of  insulin.       . THE MORNING OF SURGERY, take15   units of [place]   insulin.  . The day of surgery, do not take other diabetes injectables, including Byetta (exenatide), Bydureon (exenatide ER), Victoza (liraglutide), or Trulicity (dulaglutide).              You may not have any metal on your body including               piercings  Do not wear jewelry,  lotions, powders or deodorant                        Men may shave face and neck.   Do not bring valuables to the hospital. [place].  Contacts, dentures or  bridgework may not be worn into surgery.      Special Instructions: N/A              Please read over the following fact sheets you were given: _____________________________________________________________________             [facility] - Preparing for Surgery Before surgery, you can play an important role.  Because skin is not sterile, your skin needs to be as free of germs as possible.  You can reduce the number of germs on your skin by washing with CHG (chlorahexidine gluconate) soap before surgery.  CHG is an antiseptic cleaner which kills germs and bonds with the skin to continue killing germs even after washing. Please DO NOT use if you have an allergy to CHG or antibacterial soaps.  If your skin becomes reddened/irritated stop using the CHG and inform your nurse when you arrive at Short Stay. r.  You may shave your face/neck. Please follow these instructions carefully:  1.  Shower with CHG Soap the night before surgery and the  morning of Surgery.  2.  If you choose to wash your hair, wash your hair first as usual with your  normal  shampoo.  3.  After you shampoo, rinse your hair and body thoroughly to remove the  shampoo.                                        4.  Use CHG as you would any other liquid soap.  You can apply chg directly  to the skin and wash                       Gently with a scrungie or clean washcloth.  5.  Apply the CHG Soap to your body ONLY FROM THE NECK DOWN.   Do not use on face/ open                           Wound or open sores. Avoid contact with eyes, ears mouth and genitals (private parts).  Wash face,  Genitals (private parts) with your normal soap.             6.  Wash thoroughly, paying special attention to the area where your surgery  will be performed.  7.  Thoroughly rinse your body with warm water from the neck down.  8.  DO NOT shower/wash with your normal soap after using and rinsing off  the CHG Soap.             9.  Pat  yourself dry with a clean towel.            10.  Wear clean pajamas.            11.  Place clean sheets on your bed the night of your first shower and do not  sleep with pets. Day of Surgery : Do not apply any lotions/deodorants the morning of surgery.  Please wear clean clothes to the hospital/surgery center.  FAILURE TO FOLLOW THESE INSTRUCTIONS MAY RESULT IN THE CANCELLATION OF YOUR SURGERY PATIENT SIGNATURE_________________________________  NURSE SIGNATURE__________________________________  ________________________________________________________________________

## 2020-11-14 ENCOUNTER — Encounter (HOSPITAL_COMMUNITY)
Admission: RE | Admit: 2020-11-14 | Discharge: 2020-11-14 | Disposition: A | Discharge status: Home / Self Care | Payer: Medicare Other | Source: Ambulatory Visit | Attending: Student in an Organized Health Care Education/Training Program | Admitting: Student in an Organized Health Care Education/Training Program

## 2020-11-15 ENCOUNTER — Other Ambulatory Visit: Payer: Self-pay

## 2020-11-15 ENCOUNTER — Encounter (HOSPITAL_COMMUNITY): Payer: Self-pay

## 2020-11-15 ENCOUNTER — Encounter (HOSPITAL_COMMUNITY)
Admission: RE | Admit: 2020-11-15 | Discharge: 2020-11-15 | Disposition: A | Discharge status: Home / Self Care | Payer: Medicare Other | Source: Ambulatory Visit | Attending: General Surgery | Admitting: General Surgery

## 2020-11-15 DIAGNOSIS — Z01812 Encounter for preprocedural laboratory examination: Secondary | ICD-10-CM | POA: Diagnosis not present

## 2020-11-15 HISTORY — DX: Gastro-esophageal reflux disease without esophagitis: K21.9

## 2020-11-15 HISTORY — DX: Post-traumatic stress disorder, unspecified: F43.10

## 2020-11-15 LAB — CBC
HCT: 47.4 % (ref 39.0–52.0)
Hemoglobin: 15.8 g/dL (ref 13.0–17.0)
MCH: 28.1 pg (ref 26.0–34.0)
MCHC: 33.3 g/dL (ref 30.0–36.0)
MCV: 84.3 fL (ref 80.0–100.0)
Platelets: 184 10*3/uL (ref 150–400)
RBC: 5.62 MIL/uL (ref 4.22–5.81)
RDW: 13.9 % (ref 11.5–15.5)
WBC: 7.9 10*3/uL (ref 4.0–10.5)
nRBC: 0 % (ref 0.0–0.2)

## 2020-11-15 LAB — BASIC METABOLIC PANEL
Anion gap: 11 (ref 5–15)
BUN: 8 mg/dL (ref 6–20)
CO2: 28 mmol/L (ref 22–32)
Calcium: 9.1 mg/dL (ref 8.9–10.3)
Chloride: 98 mmol/L (ref 98–111)
Creatinine, Ser: 0.82 mg/dL (ref 0.61–1.24)
GFR, Estimated: >60 mL/min (ref >60)
Glucose, Bld: 232 mg/dL — ABNORMAL HIGH (ref 70–99)
Potassium: 3.6 mmol/L (ref 3.5–5.1)
Sodium: 137 mmol/L (ref 135–145)

## 2020-11-15 LAB — HEMOGLOBIN A1C
Hgb A1c MFr Bld: 10.3 % — ABNORMAL HIGH (ref 4.8–5.6)
Mean Plasma Glucose: 248.91 mg/dL

## 2020-11-15 LAB — GLUCOSE, CAPILLARY: Glucose-Capillary: 234 mg/dL — ABNORMAL HIGH (ref 70–99)

## 2020-11-15 NOTE — Patient Instructions (Signed)
DUE TO COVID-19 ONLY ONE VISITOR IS ALLOWED TO COME WITH YOU AND STAY IN THE WAITING ROOM ONLY DURING PRE OP AND PROCEDURE.   IF YOU WILL BE ADMITTED INTO THE HOSPITAL YOU ARE ALLOWED ONE SUPPORT PERSON DURING VISITATION HOURS ONLY (10AM -8PM)   . The support person may change daily. . The support person must pass our screening, gel in and out, and wear a mask at all times, including in the patient's room. . Patients must also wear a mask when staff or their support person are in the room.   COVID SWAB TESTING MUST BE COMPLETED ON:   Monday, 11-19-20 @ 3:05 PM   [address] [place], [place] [address]  (Must self quarantine after testing. Follow instructions on handout.)         Your procedure is scheduled on:  11-22-20   Report to [facility] Main  Entrance   Call this number if you have problems the morning of surgery [phone]   Do not eat food :After Midnight.   May have liquids until 4:30 AM day of surgery  CLEAR LIQUID DIET  Foods Allowed                                                                     Foods Excluded  Water, Black Coffee and tea, regular and decaf             liquids that you cannot  Plain Jell-O in any flavor  (No red)                                    see through such as: Fruit ices (not with fruit pulp)                                      milk, soups, orange juice              Iced Popsicles (No red)                                      All solid food                                   Apple juices Sports drinks like Gatorade (No red) Lightly seasoned clear broth or consume(fat free)  Sugar, honey syrup    Complete one G2 drink the morning of surgery at 4:30 AM  the day of surgery.       1. The day of surgery:  ? Drink ONE (1) Pre-Surgery Clear Ensure or G2 by am the morning of surgery. Drink in one sitting. Do not sip.  ? This drink was given to you during your hospital  pre-op appointment visit. ? Nothing else to drink after  completing the  Pre-Surgery Clear Ensure or G2.          If you have questions, please contact your surgeon's office.  Oral Hygiene is also important to reduce your risk of infection.                                    Remember - BRUSH YOUR TEETH THE MORNING OF SURGERY WITH YOUR REGULAR TOOTHPASTE   Do NOT smoke after Midnight   Take these medicines the morning of surgery with A SIP OF WATER:  Buspirone, Gabapentin, Propranolol, Prilosec                                You may not have any metal on your body including jewelry, and body piercings             Do not wear  lotions, powders, perfumes/cologne, or deodorant             Men may shave face and neck.   Do not bring valuables to the hospital. [facility] IS NOT RESPONSIBLE   FOR VALUABLES.   Contacts, dentures or bridgework may not be worn into surgery.   Bring small overnight bag day of surgery.                  Please read over the following fact sheets you were given: IF YOU HAVE QUESTIONS ABOUT YOUR PRE OP INSTRUCTIONS PLEASE CALL [phone] [facility] - Preparing for Surgery Before surgery, you can play an important role.  Because skin is not sterile, your skin needs to be as free of germs as possible.  You can reduce the number of germs on your skin by washing with CHG (chlorahexidine gluconate) soap before surgery.  CHG is an antiseptic cleaner which kills germs and bonds with the skin to continue killing germs even after washing. Please DO NOT use if you have an allergy to CHG or antibacterial soaps.  If your skin becomes reddened/irritated stop using the CHG and inform your nurse when you arrive at Short Stay. Do not shave (including legs and underarms) for at least 48 hours prior to the first CHG shower.  You may shave your face/neck.  Please follow these instructions carefully:  1.  Shower with CHG Soap the night before surgery and the  morning of surgery.  2.  If you choose to wash your hair, wash  your hair first as usual with your normal  shampoo.  3.  After you shampoo, rinse your hair and body thoroughly to remove the shampoo.                             4.  Use CHG as you would any other liquid soap.  You can apply chg directly to the skin and wash.  Gently with a scrungie or clean washcloth.  5.  Apply the CHG Soap to your body ONLY FROM THE NECK DOWN.   Do   not use on face/ open                           Wound or open sores. Avoid contact with eyes, ears mouth and   genitals (private parts).                       Wash face,  Genitals (private parts) with your normal soap.  6.  Wash thoroughly, paying special attention to the area where your    surgery  will be performed.  7.  Thoroughly rinse your body with warm water from the neck down.  8.  DO NOT shower/wash with your normal soap after using and rinsing off the CHG Soap.                9.  Pat yourself dry with a clean towel.            10.  Wear clean pajamas.            11.  Place clean sheets on your bed the night of your first shower and do not  sleep with pets. Day of Surgery : Do not apply any lotions/deodorants the morning of surgery.  Please wear clean clothes to the hospital/surgery center.  FAILURE TO FOLLOW THESE INSTRUCTIONS MAY RESULT IN THE CANCELLATION OF YOUR SURGERY  PATIENT SIGNATURE_________________________________  NURSE SIGNATURE__________________________________  ________________________________________________________________________  How to Manage Your Diabetes Before and After Surgery  Why is it important to control my blood sugar before and after surgery? . Improving blood sugar levels before and after surgery helps healing and can limit problems. . A way of improving blood sugar control is eating a healthy diet by: o  Eating less sugar and carbohydrates o  Increasing activity/exercise o  Talking with your doctor about reaching your blood sugar goals . High blood sugars (greater  than 180 mg/dL) can raise your risk of infections and slow your recovery, so you will need to focus on controlling your diabetes during the weeks before surgery. . Make sure that the doctor who takes care of your diabetes knows about your planned surgery including the date and location.  How do I manage my blood sugar before surgery? . Check your blood sugar at least 4 times a day, starting 2 days before surgery, to make sure that the level is not too high or low. o Check your blood sugar the morning of your surgery when you wake up and every 2 hours until you get to the Short Stay unit. . If your blood sugar is less than 70 mg/dL, you will need to treat for low blood sugar: o Do not take insulin. o Treat a low blood sugar (less than 70 mg/dL) with  cup of clear juice (cranberry or apple), 4 glucose tablets, OR glucose gel. o Recheck blood sugar in 15 minutes after treatment (to make sure it is greater than 70 mg/dL). If your blood sugar is not greater than 70 mg/dL on recheck, call [phone] for further instructions. . Report your blood sugar to the short stay nurse when you get to Short Stay.  . If you are admitted to the hospital after surgery: o Your blood sugar will be checked by the staff and you will probably be given insulin after surgery (instead of oral diabetes medicines) to make sure you have good blood sugar levels. o The goal for blood sugar control after surgery is 80-180 mg/dL.   WHAT DO I DO ABOUT MY DIABETES MEDICATION?  Marland Kitchen Do not take oral diabetes medicines (pills) the morning of surgery.  . THE DAY BEFORE SURGERY:  Do not take day before procedure.       . THE MORNING OF SURGERY:  Do not take Jardiance.   Reviewed and Endorsed by [facility] Patient Education Committee, August 2015

## 2020-11-15 NOTE — Progress Notes (Addendum)
COVID Vaccine Completed:  x2 Date COVID Vaccine completed:  August 2021 2nd dose COVID vaccine manufacturer: Marena Chancy of manufacturer   PCP - Dr. Doretha Sou Cardiologist - Dr. Quay Burow  Chest x-ray - 07-01-20 in [facility] - 06-30-20 in [facility] - 2012 in [facility] E[facility] - 2005 in [facility] Cardiac Cath -  Pacemaker/ICD device last checked:  Sleep Study - 2+ years, +sleep apnea CPAP - No  Fasting Blood Sugar - 200 to 600 (not in a long time) Checks Blood Sugar - twice a week.  Does not take Insulin as prescribed and takes Jardiance sometimes.  Blood Thinner Instructions: Aspirin Instructions: Last Dose:  Anesthesia review: Becker's muscular dystophy, DM, sleep apnea.  Cardiac workup in the past for dizziness, tobacco abuse and hyperlipidemia.    A1c 10.6 at PAT  Patient denies shortness of breath, fever, cough and chest pain at PAT appointment.  Pt able to climb a flight of stairs and perform housework.   Patient verbalized understanding of instructions that were given to them at the PAT appointment. Patient was also instructed that they will need to review over the PAT instructions again at home before surgery.

## 2020-11-15 NOTE — Progress Notes (Signed)
A1c results sent to Dr. Kieth Brightly to review.

## 2020-11-19 ENCOUNTER — Other Ambulatory Visit (HOSPITAL_COMMUNITY): Payer: Medicare Other

## 2020-11-21 ENCOUNTER — Other Ambulatory Visit: Payer: Self-pay

## 2020-11-21 ENCOUNTER — Ambulatory Visit (INDEPENDENT_AMBULATORY_CARE_PROVIDER_SITE_OTHER): Payer: Medicare Other | Admitting: Family Medicine

## 2020-11-21 ENCOUNTER — Encounter: Payer: Self-pay | Admitting: Family Medicine

## 2020-11-21 VITALS — BP 124/82 | HR 84 | Ht 65.0 in | Wt 239.2 lb

## 2020-11-21 DIAGNOSIS — Z794 Long term (current) use of insulin: Secondary | ICD-10-CM | POA: Diagnosis not present

## 2020-11-21 DIAGNOSIS — E1165 Type 2 diabetes mellitus with hyperglycemia: Secondary | ICD-10-CM

## 2020-11-21 DIAGNOSIS — K436 Other and unspecified ventral hernia with obstruction, without gangrene: Secondary | ICD-10-CM

## 2020-11-21 DIAGNOSIS — K439 Ventral hernia without obstruction or gangrene: Secondary | ICD-10-CM | POA: Insufficient documentation

## 2020-11-21 MED ORDER — TRAMADOL HCL 50 MG PO TABS: 25.0000 mg | ORAL_TABLET | Freq: Four times a day (QID) | ORAL | 0 refills | Status: DC | PRN

## 2020-11-21 MED ORDER — TRULICITY 0.75 MG/0.5ML ~~LOC~~ SOAJ: 0.7500 mg | SUBCUTANEOUS | 0 refills | Status: DC

## 2020-11-21 NOTE — Progress Notes (Signed)
     SUBJECTIVE:   CHIEF COMPLAINT / HPI:   [name] is a 40 y.o. male presents for hernia follow up  Ventral hernias Seen in the ED on 12/8 for abdominal pain on going for 2 weeks.  CT abdomen pelvis--4 ventral wall hernias all containing a small bowel loop with possible strangulation/incarceration. No evidence of bowel obstruction. D/c home with return precautions. Surgery was due for this week with Dr Rich Number but cancelled, date pushed back to [place]. Since then pt reports symptoms worsening. RLQ abdominal sharp pain 10/10 severity. Has tried tylenol which does not help. Vomiting because of the pain. He is passing flatus and having BMs. Wants something for pain.   Diabetes Patient's current diabetic medications: jardiance 25mg  daily and lantus 40mg . Supposed to take Lantus 40mg  but has not been for many months due to burning sensation. Has not taken jardiance since ED visit because they recommended this.  Patient endorses compliance with these medications. [place] [phone].  Patient's last A1c was  Lab Results  Component Value Date   HGBA1C 10.3 (H) 11/15/2020   HGBA1C 11.8 (A) 06/26/2020   HGBA1C 10.5 (A) 03/01/2020   Eating lots of sweets and candy. Endorses fatigue and polydipsia. Patient states they understand that diet and exercise can help with her diabetes.   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 On palpation: 4 ventral hernias-one at umbilicus, one in RLQ, 2 lateral to umbilicus all non reducible. No overlying skin changes. Bowel sounds present Neuro: Cranial nerves grossly intact   ASSESSMENT/PLAN:   Type 2 diabetes mellitus with hyperglycemia (HCC) Most recent A1c 10.3. Medication compliance, diet and exercise counseling provided. Recommended restarting Lantus 40 units, Jardiance 25mg  and start Trulicity 0.75mg -once weekly. His wife will help with Trulicity injections.  Recommended keeping CBG diary of fasting blood sugars. Follow up in 1 week with me to titrate Lantus doses. Explained the goal is to wean off Lantus. Obtained BMP today. He is happy with this plan.  Ventral hernia 4 ventral hernias s/p ileostomy, hx of large bowel obstruction and perforation. On exam: 4 non reducible hernias, no overlying skin changes. No clinical sx of obstruction or strangulation. Vomiting is 2/2 pain. For pain provided pt with 15 tablets, 25mg  Tramadol (caution as pt does have hx of Narcotic use). Low threshold to go to the ED for evaluation given hx of obstruction and perforation. F/u with surgery next month. Safety precautions provided.      Lattie Haw, MD PGY-2 [place]

## 2020-11-21 NOTE — Assessment & Plan Note (Signed)
4 ventral hernias s/p ileostomy, hx of large bowel obstruction and perforation. On exam: 4 non reducible hernias, no overlying skin changes. No clinical sx of obstruction or strangulation. Vomiting is 2/2 pain. For pain provided pt with 15 tablets, $RemoveBef'25mg'VrCiDutIbK$  Tramadol (caution as pt does have hx of Narcotic use). Low threshold to go to the ED for evaluation given hx of obstruction and perforation. F/u with surgery next month. Safety precautions provided.

## 2020-11-21 NOTE — Assessment & Plan Note (Addendum)
Most recent A1c 10.3. Medication compliance, diet and exercise counseling provided. Recommended restarting Lantus 40 units, Jardiance $RemoveBeforeDE'25mg'[id number]$  and start Trulicity 0.$RemoveBeforeDEI'75mg'[id number]$ -once weekly. His wife will help with Trulicity injections.  Recommended keeping CBG diary of fasting blood sugars. Follow up in 1 week with me to titrate Lantus doses. Explained the goal is to wean off Lantus. Obtained BMP today. He is happy with this plan.

## 2020-11-21 NOTE — Patient Instructions (Signed)
Thank you for coming to see me today. It was a pleasure. Today we discussed your diabetes. It is not controlled very well and this could lead to hospital admission. I recommend starting the lantus 40 units again. I have also prescribed you trulicity which is a once weekly injection. Your wife can help you take this. Take your fasting blood sugars for the next week and you can show them to me next week!  For your hernia I have filled you for tylenol 3. If you develop vomiting, unable to pass gas or poop then please go to the ER.   We will get some labs today.  If they are abnormal or we need to do something about them, I will call you.  If they are normal, I will send you a message on MyChart (if it is active) or a letter in the mail.  If you don't hear from [place] in 2 weeks, please call the office at the number below.  Please follow-up with me in 1 week for your blood sugar management.  If you have any questions or concerns, please do not hesitate to call the office at [phone].   Best wishes,   Dr Posey Pronto

## 2020-11-22 LAB — BASIC METABOLIC PANEL
BUN/Creatinine Ratio: 9 (ref 9–20)
BUN: 8 mg/dL (ref 6–20)
CO2: 25 mmol/L (ref 20–29)
Calcium: 9.4 mg/dL (ref 8.7–10.2)
Chloride: 99 mmol/L (ref 96–106)
Creatinine, Ser: 0.87 mg/dL (ref 0.76–1.27)
GFR calc Af Amer: 126 mL/min/{1.73_m2} (ref >59)
GFR calc non Af Amer: 109 mL/min/{1.73_m2} (ref >59)
Glucose: 290 mg/dL — ABNORMAL HIGH (ref 65–99)
Potassium: 4.2 mmol/L (ref 3.5–5.2)
Sodium: 137 mmol/L (ref 134–144)

## 2020-11-23 ENCOUNTER — Encounter: Payer: Self-pay | Admitting: Emergency Medicine

## 2020-11-23 ENCOUNTER — Other Ambulatory Visit: Payer: Self-pay

## 2020-11-23 ENCOUNTER — Telehealth: Payer: Self-pay | Admitting: Student in an Organized Health Care Education/Training Program

## 2020-11-23 ENCOUNTER — Encounter: Payer: Self-pay | Admitting: Family Medicine

## 2020-11-23 ENCOUNTER — Ambulatory Visit
Admission: EM | Admit: 2020-11-23 | Discharge: 2020-11-23 | Disposition: A | Discharge status: Home / Self Care | Payer: Medicare Other | Attending: Family Medicine | Admitting: Family Medicine

## 2020-11-23 DIAGNOSIS — R109 Unspecified abdominal pain: Secondary | ICD-10-CM | POA: Diagnosis not present

## 2020-11-23 DIAGNOSIS — R112 Nausea with vomiting, unspecified: Secondary | ICD-10-CM | POA: Diagnosis not present

## 2020-11-23 DIAGNOSIS — R1084 Generalized abdominal pain: Secondary | ICD-10-CM | POA: Diagnosis not present

## 2020-11-23 DIAGNOSIS — K469 Unspecified abdominal hernia without obstruction or gangrene: Secondary | ICD-10-CM | POA: Diagnosis not present

## 2020-11-23 DIAGNOSIS — K439 Ventral hernia without obstruction or gangrene: Secondary | ICD-10-CM | POA: Diagnosis not present

## 2020-11-23 DIAGNOSIS — G8929 Other chronic pain: Secondary | ICD-10-CM | POA: Diagnosis not present

## 2020-11-23 MED ORDER — ONDANSETRON HCL 4 MG PO TABS: 4.0000 mg | ORAL_TABLET | Freq: Four times a day (QID) | ORAL | 0 refills | Status: DC

## 2020-11-23 NOTE — ED Triage Notes (Signed)
Lower abd pain x 2 years.  Found out in December he has hernias.  Was supposed surgery yesterday but it got moved until march 18th d/t too many covid cases per pt.  Pt would like something for pain.

## 2020-11-23 NOTE — Discharge Instructions (Addendum)
I have sent in Zofran for you to take one tablet every 8 hours as needed for nausea.  If the tramadol is not helping with your pain, then you may need to be seen in the ER for further evaluation and treatment  Follow up with this office or with primary care if symptoms are persisting.  Follow up in the ER for high fever, trouble swallowing, trouble breathing, other concerning symptoms.

## 2020-11-23 NOTE — Telephone Encounter (Signed)
Patient is wanting Dr. Posey Pronto to call when she is free. Thanks

## 2020-11-23 NOTE — ED Provider Notes (Signed)
[place]   [id number] 11/23/20 Arrival Time: 2025  CC: ABDOMINAL PAIN  SUBJECTIVE:  [name] is a 40 y.o. male who presents with abdominal pain, nausea and vomiting. Reports that PCP gave him tramadol 2 days ago and that this is not touching his pain. States that he has 4 hernias that were to be repaired today but that his surgery was rescheduled. Denies a precipitating event, trauma, close contacts with similar symptoms, recent travel or antibiotic use. Denies abdominal cramping. Has not taken OTC medications for this. Denies alleviating or aggravating factors.  Reports that he has an upcoming appointment with PCP to help manage pain until he can get in for surgery.  Denies fever, chills, appetite changes, weight changes, chest pain, SOB, diarrhea, constipation, hematochezia, melena, dysuria, difficulty urinating, increased frequency or urgency, flank pain, loss of bowel or bladder function,  ROS: As per HPI.  All other pertinent ROS negative.     Past Medical History:  Diagnosis Date  . Allergy   . Anxiety   . 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 Bipolar disorder ([place])    On disability  . Depression   . Diabetes mellitus without complication ([place])    per patient : under control with diet, does not monitor cbg at home   . GERD (gastroesophageal reflux disease)   . Headache(784.0)    daily headaches  . Hyperlipidemia   . Neuromuscular disorder ([place])    Beckers muscular dystrophy  . Obesity   . PTSD (post-traumatic stress disorder)   . Sleep apnea    Does not tolerate CPAP   Past Surgical History:  Procedure Laterality Date  . APPLICATION OF WOUND VAC N/A 11/23/2018   Procedure: Application Of Wound Vac;  Surgeon: Kinsinger, Arta Bruce, MD;  Location: [place];  Service: General;  Laterality: N/A;  . COLECTOMY N/A 11/23/2018   Procedure: TOTAL COLECTOMY;  Surgeon: Kieth Brightly Arta Bruce, MD;  Location: [place];  Service: General;  Laterality: N/A;  . ILEOSTOMY N/A  11/23/2018   Procedure: ILEOSTOMY;  Surgeon: Kinsinger, Arta Bruce, MD;  Location: [place];  Service: General;  Laterality: N/A;  . ILEOSTOMY CLOSURE N/A 05/19/2019   Procedure: ILEOSTOMY REVERSAL WITH ILEOCOLIC AND COLORECTAL ANASTOMOSIS;  Surgeon: Kinsinger, Arta Bruce, MD;  [facility];  Service: General;  Laterality: N/A;  . NM MYOCAR PERF WALL MOTION  01/22/2011   protocol: Persantine, moderate reversible inferior defect post stress EF 48%, high risk scan  . TONSILLECTOMY    . TRANSTHORACIC ECHOCARDIOGRAM  07/05/2004   EF=>55% normal study    Allergies  Allergen Reactions  . Metformin And Related Other (See Comments)    Stomach cramps,  Throat closing sensation, could not swallow  . Penicillins Anaphylaxis, Hives, Shortness Of Breath and Swelling    Did it involve swelling of the face/tongue/throat, SOB, or low BP? Yes Did it involve sudden or severe rash/hives, skin peeling, or any reaction on the inside of your mouth or nose? Unk Did you need to seek medical attention at a hospital or doctor's office? No When did it last happen?Unk If all above answers are "NO", may proceed with cephalosporin use. Got ceftriaxone before   . Nsaids Other (See Comments)    States he is not supposed to take nsaids because of his colon    No current facility-administered medications on file prior to encounter.   Current Outpatient Medications on File Prior to Encounter  Medication Sig Dispense Refill  . atorvastatin (LIPITOR) 40 MG  tablet Take 1 tablet (40 mg total) by mouth daily. 90 tablet 1  . Blood Glucose Monitoring Suppl (ONE TOUCH ULTRA 2) w/Device KIT USE TWO TIMES A DAY 1 kit 0  . busPIRone (BUSPAR) 10 MG tablet Take 10 mg by mouth 3 (three) times daily.    . celecoxib (CELEBREX) 100 MG capsule Take 1 capsule (100 mg total) by mouth 2 (two) times daily. 30 capsule 0  . dicyclomine (BENTYL) 10 MG capsule Take 1 capsule (10 mg total) by mouth 4 (four) times daily -  before meals and at  bedtime. 30 capsule 0  . diphenhydramine-acetaminophen (TYLENOL PM) 25-500 MG TABS tablet Take 3 tablets by mouth at bedtime as needed (Sleep).    . Dulaglutide (TRULICITY) 0.16 WF/0.9NA SOPN Inject 0.75 mg into the skin once a week. 2 mL 0  . empagliflozin (JARDIANCE) 25 MG TABS tablet Take 25 mg by mouth daily. 90 tablet 3  . gabapentin (NEURONTIN) 300 MG capsule Take 300 mg by mouth daily.   1  . glucose blood (ONETOUCH ULTRA) test strip USE TO TEST TWO TIMES A DAY 100 strip 3  . insulin glargine (LANTUS) 100 UNIT/ML Solostar Pen Inject 40 Units into the skin every morning. Start at 10 units daily then increase by 1 unit daily. (Patient taking differently: Inject 30 Units into the skin every morning.) 3 mL 11  . Insulin Pen Needle 32G X 6 MM MISC 1 packet by Does not apply route 2 (two) times daily. 1 each 4  . Lancets (ONETOUCH DELICA PLUS TFTDDU20U) MISC USE TO TEST TWO TIMES A DAY 100 each 0  . lurasidone (LATUDA) 40 MG TABS tablet Take 40 tablets by mouth at bedtime.    Marland Kitchen omeprazole (PRILOSEC) 20 MG capsule Take 2 capsules (40 mg total) by mouth at bedtime. 180 capsule 1  . ondansetron (ZOFRAN ODT) 4 MG disintegrating tablet Take 1 tablet (4 mg total) by mouth every 8 (eight) hours as needed for nausea or vomiting. 20 tablet 0  . potassium chloride SA (KLOR-CON) 20 MEQ tablet Take 1 tablet (20 mEq total) by mouth 2 (two) times daily. 10 tablet 0  . propranolol (INDERAL) 40 MG tablet Take 40 mg by mouth 2 (two) times daily.    Marland Kitchen tiZANidine (ZANAFLEX) 4 MG tablet Take 1 tablet (4 mg total) by mouth every 6 (six) hours as needed for muscle spasms. 30 tablet 0  . traMADol (ULTRAM) 50 MG tablet Take 0.5 tablets (25 mg total) by mouth every 6 (six) hours as needed. 15 tablet 0  . traZODone (DESYREL) 100 MG tablet Take 50-100 mg by mouth at bedtime.    Marland Kitchen VYVANSE 20 MG capsule Take 20 mg by mouth daily with breakfast. Take with 50 mg for a total of 70 mg    . VYVANSE 50 MG capsule Take 50 mg by  mouth See admin instructions. Take with 20 for a total of 70 mg in the morning     Social History   Socioeconomic History  . Marital status: Married    Spouse name: Not on file  . Number of children: 2  . Years of education: 37  . Highest education level: Not on file  Occupational History    Employer: UNEMPLOYED  Tobacco Use  . Smoking status: Current Every Day Smoker    Packs/day: 1.50    Years: 21.00    Pack years: 31.50    Types: Cigarettes  . Smokeless tobacco: Never Used  Vaping Use  .  Vaping Use: Former  . Devices: Increased nicotine cravings  Substance and Sexual Activity  . Alcohol use: No    Alcohol/week: 0.0 standard drinks    Comment: quit 2013  . Drug use: Not Currently    Types: Marijuana    Comment: 7-27  . Sexual activity: Yes    Birth control/protection: None  Other Topics Concern  . Not on file  Social History Narrative   Patient lives at home with parents, brother and brothers wife.    Patient is single.    Patient has 2 children.    Patient has a high school education.    Patient is unemployed.    Patient is right handed.    Social Determinants of Health   Financial Resource Strain: Not on file  Food Insecurity: Not on file  Transportation Needs: Not on file  Physical Activity: Not on file  Stress: Not on file  Social Connections: Not on file  Intimate Partner Violence: Not on file   Family History  Problem Relation Age of Onset  . COPD Mother   . Depression Mother   . Diabetes Mother   . Hyperlipidemia Mother   . Asthma Father   . Arthritis Father   . Diabetes Father   . Heart disease Father   . Hyperlipidemia Father   . Hypertension Father   . Hypertension Brother      OBJECTIVE:  Vitals:   11/23/20 0902  BP: 136/76  Pulse: 99  Resp: 18  Temp: 99 F (37.2 C)  TempSrc: Oral  SpO2: 96%    General appearance: Alert; NAD HEENT: NCAT.  Oropharynx clear.  Lungs: clear to auscultation bilaterally without adventitious  breath sounds Heart: regular rate and rhythm.  Radial pulses 2+ symmetrical bilaterally Abdomen: soft, non-distended; normal active bowel sounds; generalized tenderness to light palpation across the abdomen; nontender at McBurney's point; negative Murphy's sign; negative rebound; no guarding Back: no CVA tenderness Extremities: no edema; symmetrical with no gross deformities Skin: warm and dry Neurologic: normal gait Psychological: alert and cooperative; normal mood and affect  LABS: No results found for this or any previous visit (from the past 24 hour(s)).  DIAGNOSTIC STUDIES: No results found.   ASSESSMENT & PLAN:  1. Hernia of abdominal wall   2. Abdominal pain, unspecified abdominal location   3. Nausea and vomiting, intractability of vomiting not specified, unspecified vomiting type     Meds ordered this encounter  Medications  . ondansetron (ZOFRAN) 4 MG tablet    Sig: Take 1 tablet (4 mg total) by mouth every 6 (six) hours.    Dispense:  12 tablet    Refill:  0    Order Specific Question:   Supervising Provider    Answer:   Chase Picket [id number]     Get rest and drink plenty of fluids Zofran prescribed. Take as directed.   If pain is getting worse, go to the ER for further evaluation and treatment  If you experience new or worsening symptoms return or go to ER such as fever, chills, nausea, vomiting, diarrhea, bloody or dark tarry stools, constipation, urinary symptoms, worsening abdominal discomfort, symptoms that do not improve with medications, inability to keep fluids down.  Reviewed expectations re: course of current medical issues. Questions answered. Outlined signs and symptoms indicating need for more acute intervention. Patient verbalized understanding. After Visit Summary given.   Faustino Congress, NP 11/23/20 [phone]

## 2020-11-23 NOTE — Telephone Encounter (Signed)
He sent me a mychart message too and so I have replied to him. Thank you.

## 2020-11-25 NOTE — Progress Notes (Deleted)
   Subjective:   Patient ID: [name]    DOB: 04-11-1981, 40 y.o. male   MRN: [id number]  [name] is a 40 y.o. male with a history of allergic rhinitis, OSA, GERD, T2DM, muscular dystrophy, alcohol abuse, anxiety/depression, back pain, bipolar disorder, ED, HLD, memory difficulty, narcotic abuse, obesity, tobacco abuse, ventral hernia here for hernia repair follow up  Abdominal Pain 2/2 Hernia:  Patient presents again for continued abdominal pain secondary to 4 ventral hernias. ENodrses nausea and vomiting **. Seen in ED on 2/4 for similar complaint and given Zofran and instructed to drink plenty of fluids. He was seen by Dr. Posey Pronto on 2/2 for same complaint and given Tramadol for pain control in setting of his narcotic abuse. He has demonstrated anger through MyChart as his pain has not been well controlled. He is scheduled for Hernia repair on 01/04/21.  Denies fever, chills, appetite changes, weight changes, chest pain, SOB, diarrhea, constipation, hematochezia, melena, dysuria, difficulty urinating, increased frequency or urgency, flank pain, loss of bowel or bladder function,    Review of Systems:  Per HPI.   Objective:   There were no vitals taken for this visit. Vitals and nursing note reviewed.  General: pleasant ***, sitting comfortably in exam chair, well nourished, well developed, in no acute distress with non-toxic appearance HEENT: normocephalic, atraumatic, moist mucous membranes, oropharynx clear without erythema or exudate, TM normal bilaterally  Neck: supple, non-tender without lymphadenopathy CV: regular rate and rhythm without murmurs, rubs, or gallops, no lower extremity edema, 2+ radial and pedal pulses bilaterally Lungs: clear to auscultation bilaterally with normal work of breathing on room air Resp: breathing comfortably on room air, speaking in full sentences Abdomen: soft, non-tender, non-distended, no masses or organomegaly palpable, normoactive bowel  sounds Skin: warm, dry, no rashes or lesions Extremities: warm and well perfused, normal tone MSK: ROM grossly intact, strength intact, gait normal Neuro: Alert and oriented, speech normal  Assessment & Plan:   No problem-specific Assessment & Plan notes found for this encounter.  No orders of the defined types were placed in this encounter.  No orders of the defined types were placed in this encounter.   Mina Marble, DO PGY-3, [facility] 11/25/2020 9:33 AM

## 2020-11-26 ENCOUNTER — Ambulatory Visit: Payer: Medicare Other

## 2020-11-28 ENCOUNTER — Ambulatory Visit: Payer: Medicare Other | Admitting: Family Medicine

## 2020-12-03 ENCOUNTER — Other Ambulatory Visit: Payer: Self-pay | Admitting: Family Medicine

## 2020-12-06 NOTE — Progress Notes (Deleted)
Cardiology Office Note:    Date:  12/06/2020   ID:  [name], [name] May 24, 1981, MRN [id number]  PCP:  [name], DO  Cardiologist:  No primary care provider on file.  Electrophysiologist:  None   Referring MD: [name], DO   No chief complaint on file. ***  History of Present Illness:    [name] is a 40 y.o. male with a hx of ***  Past Medical History:  Diagnosis Date  . Allergy   . Anxiety   . 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 Bipolar disorder ([place])    On disability  . Depression   . Diabetes mellitus without complication ([place])    per patient : under control with diet, does not monitor cbg at home   . GERD (gastroesophageal reflux disease)   . Headache(784.0)    daily headaches  . Hyperlipidemia   . Neuromuscular disorder ([place])    Beckers muscular dystrophy  . Obesity   . PTSD (post-traumatic stress disorder)   . Sleep apnea    Does not tolerate CPAP    Past Surgical History:  Procedure Laterality Date  . APPLICATION OF WOUND VAC N/A 11/23/2018   Procedure: Application Of Wound Vac;  Surgeon: Kinsinger, Arta Bruce, MD;  Location: [place];  Service: General;  Laterality: N/A;  . COLECTOMY N/A 11/23/2018   Procedure: TOTAL COLECTOMY;  Surgeon: Kieth Brightly Arta Bruce, MD;  Location: [place];  Service: General;  Laterality: N/A;  . ILEOSTOMY N/A 11/23/2018   Procedure: ILEOSTOMY;  Surgeon: Kinsinger, Arta Bruce, MD;  Location: [place];  Service: General;  Laterality: N/A;  . ILEOSTOMY CLOSURE N/A 05/19/2019   Procedure: ILEOSTOMY REVERSAL WITH ILEOCOLIC AND COLORECTAL ANASTOMOSIS;  Surgeon: Kinsinger, Arta Bruce, MD;  Location[facility];  Service: General;  Laterality: N/A;  . NM MYOCAR PERF WALL MOTION  01/22/2011   protocol: Persantine, moderate reversible inferior defect post stress EF 48%, high risk scan  . TONSILLECTOMY    . TRANSTHORACIC ECHOCARDIOGRAM  07/05/2004   EF=>55% normal study     Current Medications: No outpatient medications have  been marked as taking for the 12/07/20 encounter (Appointment) with Donato Heinz, MD.     Allergies:   Metformin and related, Penicillins, and Nsaids   Social History   Socioeconomic History  . Marital status: Married    Spouse name: Not on file  . Number of children: 2  . Years of education: 63  . Highest education level: Not on file  Occupational History    Employer: UNEMPLOYED  Tobacco Use  . Smoking status: Current Every Day Smoker    Packs/day: 1.50    Years: 21.00    Pack years: 31.50    Types: Cigarettes  . Smokeless tobacco: Never Used  Vaping Use  . Vaping Use: Former  . Devices: Increased nicotine cravings  Substance and Sexual Activity  . Alcohol use: No    Alcohol/week: 0.0 standard drinks    Comment: quit 2013  . Drug use: Not Currently    Types: Marijuana    Comment: 7-27  . Sexual activity: Yes    Birth control/protection: None  Other Topics Concern  . Not on file  Social History Narrative   Patient lives at home with parents, brother and brothers wife.    Patient is single.    Patient has 2 children.    Patient has a high school education.    Patient is unemployed.    Patient is  right handed.    Social Determinants of Health   Financial Resource Strain: Not on file  Food Insecurity: Not on file  Transportation Needs: Not on file  Physical Activity: Not on file  Stress: Not on file  Social Connections: Not on file     Family History: The patient's ***family history includes Arthritis in his father; Asthma in his father; COPD in his mother; Depression in his mother; Diabetes in his father and mother; Heart disease in his father; Hyperlipidemia in his father and mother; Hypertension in his brother and father.  ROS:   Please see the history of present illness.    *** All other systems reviewed and are negative.  EKGs/Labs/Other Studies Reviewed:    The following studies were reviewed today: ***  EKG:  EKG is *** ordered today.   The ekg ordered today demonstrates ***  Recent Labs: 09/26/2020: ALT 25 11/15/2020: Hemoglobin 15.8; Platelets 184 11/21/2020: BUN 8; Creatinine, Ser 0.87; Potassium 4.2; Sodium 137  Recent Lipid Panel    Component Value Date/Time   CHOL 217 (H) 11/23/2019 1208   TRIG 337 (H) 11/23/2019 1208   HDL 33 (L) 11/23/2019 1208   CHOLHDL 6.6 (H) 11/23/2019 1208   CHOLHDL 11.7 (H) 12/11/2015 1423   VLDL 40 (H) 12/11/2015 1423   LDLCALC 124 (H) 11/23/2019 1208   LDLDIRECT 63 07/23/2012 1552    Physical Exam:    VS:  There were no vitals taken for this visit.    Wt Readings from Last 3 Encounters:  11/21/20 239 lb 3.2 oz (108.5 kg)  11/15/20 235 lb 12.8 oz (107 kg)  09/26/20 238 lb (108 kg)     GEN: *** Well nourished, well developed in no acute distress HEENT: Normal NECK: No JVD; No carotid bruits LYMPHATICS: No lymphadenopathy CARDIAC: ***RRR, no murmurs, rubs, gallops RESPIRATORY:  Clear to auscultation without rales, wheezing or rhonchi  ABDOMEN: Soft, non-tender, non-distended MUSCULOSKELETAL:  No edema; No deformity  SKIN: Warm and dry NEUROLOGIC:  Alert and oriented x 3 PSYCHIATRIC:  Normal affect   ASSESSMENT:    No diagnosis found. PLAN:    In order of problems listed above:  1. ***   Medication Adjustments/Labs and Tests Ordered: Current medicines are reviewed at length with the patient today.  Concerns regarding medicines are outlined above.  No orders of the defined types were placed in this encounter.  No orders of the defined types were placed in this encounter.   There are no Patient Instructions on file for this visit.   Signed, Donato Heinz, MD  12/06/2020 9:52 PM    [place]

## 2020-12-07 ENCOUNTER — Other Ambulatory Visit: Payer: Self-pay | Admitting: Family Medicine

## 2020-12-07 ENCOUNTER — Ambulatory Visit: Payer: Medicare Other | Admitting: Cardiology

## 2020-12-19 ENCOUNTER — Other Ambulatory Visit: Payer: Self-pay | Admitting: Student in an Organized Health Care Education/Training Program

## 2020-12-19 DIAGNOSIS — K219 Gastro-esophageal reflux disease without esophagitis: Secondary | ICD-10-CM

## 2020-12-20 DIAGNOSIS — G4700 Insomnia, unspecified: Secondary | ICD-10-CM | POA: Diagnosis not present

## 2020-12-24 NOTE — Patient Instructions (Addendum)
DUE TO COVID-19 ONLY ONE VISITOR IS ALLOWED TO COME WITH YOU AND STAY IN THE WAITING ROOM ONLY DURING PRE OP AND PROCEDURE DAY OF SURGERY. THE 1 VISITOR  MAY VISIT WITH YOU AFTER SURGERY IN YOUR PRIVATE ROOM DURING VISITING HOURS ONLY!  YOU NEED TO HAVE A COVID 19 TEST ON__3/15_____ @_2 :30______, THIS TEST MUST BE DONE BEFORE SURGERY,  COVID TESTING SITE [place] [address], IT IS ON THE RIGHT GOING OUT [address] APPROXIMATELY  2 MINUTES PAST [employer] ON THE RIGHT. ONCE YOUR COVID TEST IS COMPLETED,  PLEASE BEGIN THE QUARANTINE INSTRUCTIONS AS OUTLINED IN YOUR HANDOUT.                [name]    Your procedure is scheduled on: 01/04/21   Report to [facility] Main  Entrance   Report to short stay at 5:30 AM     Call this number if you have problems the morning of surgery [phone]    Remember: Do not eat food or drink liquids :After Midnight.   BRUSH YOUR TEETH MORNING OF SURGERY AND RINSE YOUR MOUTH OUT, NO CHEWING GUM CANDY OR MINTS.     Take these medicines the morning of surgery with A SIP OF WATER: Buspirone, Gabapentin, Propranolol, Prilosec  How to Manage Your Diabetes Before and After Surgery  Why is it important to control my blood sugar before and after surgery? . Improving blood sugar levels before and after surgery helps healing and can limit problems. . A way of improving blood sugar control is eating a healthy diet by: o  Eating less sugar and carbohydrates o  Increasing activity/exercise o  Talking with your doctor about reaching your blood sugar goals . High blood sugars (greater than 180 mg/dL) can raise your risk of infections and slow your recovery, so you will need to focus on controlling your diabetes during the weeks before surgery. . Make sure that the doctor who takes care of your diabetes knows about your planned surgery including the date and location.  How do I manage my blood sugar before  surgery? . Check your blood sugar at least 4 times a day, starting 2 days before surgery, to make sure that the level is not too high or low. o Check your blood sugar the morning of your surgery when you wake up and every 2 hours until you get to the Short Stay unit. . If your blood sugar is less than 70 mg/dL, you will need to treat for low blood sugar: o Do not take insulin. o Treat a low blood sugar (less than 70 mg/dL) with  cup of clear juice (cranberry or apple), 4 glucose tablets, OR glucose gel. o Recheck blood sugar in 15 minutes after treatment (to make sure it is greater than 70 mg/dL). If your blood sugar is not greater than 70 mg/dL on recheck, call [phone] for further instructions. . Report your blood sugar to the short stay nurse when you get to Short Stay.  . If you are admitted to the hospital after surgery: o Your blood sugar will be checked by the staff and you will probably be given insulin after surgery (instead of oral diabetes medicines) to make sure you have good blood sugar levels. o The goal for blood sugar control after surgery is 80-180 mg/dL.   WHAT DO I DO ABOUT MY DIABETES MEDICATION?  Marland Kitchen Do not take oral diabetes medicines (pills) the morning of surgery.  Marland Kitchen  THE NIGHT BEFORE SURGERY, take   0  units of  Glargine   insulin.       . THE MORNING OF SURGERY, take 15  units of Glargine   insulin.  . The day of surgery, do not take other diabetes injectables, including Byetta (exenatide), Bydureon (exenatide ER), Victoza (liraglutide), or Trulicity (dulaglutide).                                  You may not have any metal on your body including              piercings  Do not wear jewelry,  lotions, powders or deodorant                         Men may shave face and neck.   Do not bring valuables to the hospital. [place].  Contacts, dentures or bridgework may not be worn into surgery.                    Please read over the following fact sheets you were given: _____________________________________________________________________             [facility] - Preparing for Surgery Before surgery, you can play an important role.  Because skin is not sterile, your skin needs to be as free of germs as possible.  You can reduce the number of germs on your skin by washing with CHG (chlorahexidine gluconate) soap before surgery.  CHG is an antiseptic cleaner which kills germs and bonds with the skin to continue killing germs even after washing. Please DO NOT use if you have an allergy to CHG or antibacterial soaps.  If your skin becomes reddened/irritated stop using the CHG and inform your nurse when you arrive at Short Stay. .  You may shave your face/neck. Please follow these instructions carefully:  1.  Shower with CHG Soap the night before surgery and the  morning of Surgery.  2.  If you choose to wash your hair, wash your hair first as usual with your  normal  shampoo.  3.  After you shampoo, rinse your hair and body thoroughly to remove the  shampoo.                                        4.  Use CHG as you would any other liquid soap.  You can apply chg directly  to the skin and wash                       Gently with a scrungie or clean washcloth.  5.  Apply the CHG Soap to your body ONLY FROM THE NECK DOWN.   Do not use on face/ open                           Wound or open sores. Avoid contact with eyes, ears mouth and genitals (private parts).                       Wash face,  Genitals (private parts) with your normal soap.  6.  Wash thoroughly, paying special attention to the area where your surgery  will be performed.  7.  Thoroughly rinse your body with warm water from the neck down.  8.  DO NOT shower/wash with your normal soap after using and rinsing off  the CHG Soap.             9.  Pat yourself dry with a clean towel.            10.  Wear clean pajamas.            11.   Place clean sheets on your bed the night of your first shower and do not  sleep with pets. Day of Surgery : Do not apply any lotions/deodorants the morning of surgery.  Please wear clean clothes to the hospital/surgery center.  FAILURE TO FOLLOW THESE INSTRUCTIONS MAY RESULT IN THE CANCELLATION OF YOUR SURGERY PATIENT SIGNATURE_________________________________  NURSE SIGNATURE__________________________________  ________________________________________________________________________

## 2020-12-25 ENCOUNTER — Encounter (HOSPITAL_COMMUNITY)
Admission: RE | Admit: 2020-12-25 | Discharge: 2020-12-25 | Disposition: A | Discharge status: Home / Self Care | Payer: Medicare Other | Source: Ambulatory Visit | Attending: General Surgery | Admitting: General Surgery

## 2020-12-25 ENCOUNTER — Encounter (HOSPITAL_COMMUNITY): Payer: Self-pay

## 2020-12-25 ENCOUNTER — Ambulatory Visit: Payer: Medicare Other | Admitting: Cardiology

## 2020-12-25 ENCOUNTER — Other Ambulatory Visit: Payer: Self-pay

## 2020-12-25 DIAGNOSIS — Z01812 Encounter for preprocedural laboratory examination: Secondary | ICD-10-CM | POA: Insufficient documentation

## 2020-12-25 LAB — BASIC METABOLIC PANEL
Anion gap: 9 (ref 5–15)
BUN: 8 mg/dL (ref 6–20)
CO2: 27 mmol/L (ref 22–32)
Calcium: 8.8 mg/dL — ABNORMAL LOW (ref 8.9–10.3)
Chloride: 99 mmol/L (ref 98–111)
Creatinine, Ser: 0.8 mg/dL (ref 0.61–1.24)
GFR, Estimated: >60 mL/min (ref >60)
Glucose, Bld: 257 mg/dL — ABNORMAL HIGH (ref 70–99)
Potassium: 4.1 mmol/L (ref 3.5–5.1)
Sodium: 135 mmol/L (ref 135–145)

## 2020-12-25 LAB — CBC
HCT: 45.9 % (ref 39.0–52.0)
Hemoglobin: 15.2 g/dL (ref 13.0–17.0)
MCH: 28.1 pg (ref 26.0–34.0)
MCHC: 33.1 g/dL (ref 30.0–36.0)
MCV: 84.8 fL (ref 80.0–100.0)
Platelets: 215 10*3/uL (ref 150–400)
RBC: 5.41 MIL/uL (ref 4.22–5.81)
RDW: 14.4 % (ref 11.5–15.5)
WBC: 8.4 10*3/uL (ref 4.0–10.5)
nRBC: 0 % (ref 0.0–0.2)

## 2020-12-25 LAB — GLUCOSE, CAPILLARY: Glucose-Capillary: 269 mg/dL — ABNORMAL HIGH (ref 70–99)

## 2020-12-25 LAB — HEMOGLOBIN A1C
Hgb A1c MFr Bld: 9.6 % — ABNORMAL HIGH (ref 4.8–5.6)
Mean Plasma Glucose: 228.82 mg/dL

## 2020-12-25 NOTE — Progress Notes (Addendum)
COVID Vaccine Completed:Yes Date COVID Vaccine completed:05/02/20, 05/30/20 COVID vaccine manufacturer:   Moderna     PCP - Dr. Prince Rome Cardiologist - no  Chest x-ray - no EKG - 06/30/20-epic Stress Test - no ECHO - no Cardiac Cath - no Pacemaker/ICD device last checked:NA  Sleep Study - yes CPAP - no  Fasting Blood Sugar - Pt not sure " less than 200" Checks Blood Sugar _____ times a day 2 times a week  Blood Thinner Instructions:NA Aspirin Instructions: Last Dose:  Anesthesia review:   Patient denies shortness of breath, fever, cough and chest pain at PAT appointment  yes Patient verbalized understanding of instructions that were given to them at the PAT appointment. Patient was also instructed that they will need to review over the PAT instructions again at home before surgery.yes Pt is loosing weight and is now off insulin. Marland Kitchen He has no SOB with any activities.

## 2021-01-01 ENCOUNTER — Other Ambulatory Visit (HOSPITAL_COMMUNITY)
Admission: RE | Admit: 2021-01-01 | Discharge: 2021-01-01 | Disposition: A | Discharge status: Home / Self Care | Payer: Medicare Other | Source: Ambulatory Visit | Attending: General Surgery | Admitting: General Surgery

## 2021-01-01 ENCOUNTER — Other Ambulatory Visit (HOSPITAL_COMMUNITY): Payer: Medicare Other

## 2021-01-01 DIAGNOSIS — Z888 Allergy status to other drugs, medicaments and biological substances status: Secondary | ICD-10-CM | POA: Diagnosis not present

## 2021-01-01 DIAGNOSIS — Z01812 Encounter for preprocedural laboratory examination: Secondary | ICD-10-CM | POA: Insufficient documentation

## 2021-01-01 DIAGNOSIS — K219 Gastro-esophageal reflux disease without esophagitis: Secondary | ICD-10-CM | POA: Diagnosis not present

## 2021-01-01 DIAGNOSIS — Z7984 Long term (current) use of oral hypoglycemic drugs: Secondary | ICD-10-CM | POA: Diagnosis not present

## 2021-01-01 DIAGNOSIS — Z20822 Contact with and (suspected) exposure to covid-19: Secondary | ICD-10-CM | POA: Diagnosis not present

## 2021-01-01 DIAGNOSIS — K432 Incisional hernia without obstruction or gangrene: Secondary | ICD-10-CM | POA: Diagnosis not present

## 2021-01-01 DIAGNOSIS — Z880 Allergy status to penicillin: Secondary | ICD-10-CM | POA: Diagnosis not present

## 2021-01-01 DIAGNOSIS — E785 Hyperlipidemia, unspecified: Secondary | ICD-10-CM | POA: Diagnosis not present

## 2021-01-01 DIAGNOSIS — Z87892 Personal history of anaphylaxis: Secondary | ICD-10-CM | POA: Diagnosis not present

## 2021-01-01 DIAGNOSIS — E1165 Type 2 diabetes mellitus with hyperglycemia: Secondary | ICD-10-CM | POA: Diagnosis not present

## 2021-01-01 DIAGNOSIS — Z83438 Family history of other disorder of lipoprotein metabolism and other lipidemia: Secondary | ICD-10-CM | POA: Diagnosis not present

## 2021-01-01 DIAGNOSIS — K08109 Complete loss of teeth, unspecified cause, unspecified class: Secondary | ICD-10-CM | POA: Diagnosis not present

## 2021-01-01 DIAGNOSIS — K660 Peritoneal adhesions (postprocedural) (postinfection): Secondary | ICD-10-CM | POA: Diagnosis not present

## 2021-01-01 DIAGNOSIS — Z794 Long term (current) use of insulin: Secondary | ICD-10-CM | POA: Diagnosis not present

## 2021-01-01 DIAGNOSIS — F17210 Nicotine dependence, cigarettes, uncomplicated: Secondary | ICD-10-CM | POA: Diagnosis not present

## 2021-01-01 DIAGNOSIS — Z833 Family history of diabetes mellitus: Secondary | ICD-10-CM | POA: Diagnosis not present

## 2021-01-01 DIAGNOSIS — Z79899 Other long term (current) drug therapy: Secondary | ICD-10-CM | POA: Diagnosis not present

## 2021-01-01 LAB — SARS CORONAVIRUS 2 (TAT 6-24 HRS): SARS Coronavirus 2: NEGATIVE

## 2021-01-02 ENCOUNTER — Ambulatory Visit: Payer: Medicare Other | Admitting: Cardiology

## 2021-01-03 MED ORDER — BUPIVACAINE LIPOSOME 1.3 % IJ SUSP
20.0000 mL | INTRAMUSCULAR | Status: DC
  Filled 2021-01-03: qty 20

## 2021-01-03 NOTE — Anesthesia Preprocedure Evaluation (Addendum)
Anesthesia Evaluation  Patient identified by MRN, date of birth, ID band Patient awake    Reviewed: Allergy & Precautions, NPO status , Patient's Chart, lab work & pertinent test results  Airway Mallampati: II  TM Distance: >3 FB Neck ROM: Full    Dental  (+) Edentulous Upper, Edentulous Lower   Pulmonary sleep apnea , Current Smoker and Patient abstained from smoking.,    Pulmonary exam normal breath sounds clear to auscultation       Cardiovascular Exercise Tolerance: Good negative cardio ROS Normal cardiovascular exam Rhythm:Regular Rate:Normal     Neuro/Psych  Headaches, PSYCHIATRIC DISORDERS Anxiety Depression Bipolar Disorder  Neuromuscular disease (Becker's muscular dystrophy)    GI/Hepatic Neg liver ROS, GERD  Medicated and Controlled,  Endo/Other  diabetes, Poorly Controlled, Type 2  Renal/GU negative Renal ROS  negative genitourinary   Musculoskeletal negative musculoskeletal ROS (+)   Abdominal (+) + obese,   Peds negative pediatric ROS (+)  Hematology negative hematology ROS (+)   Anesthesia Other Findings   Reproductive/Obstetrics negative OB ROS                           Anesthesia Physical  Anesthesia Plan  ASA: III  Anesthesia Plan: General   Post-op Pain Management:    Induction: Intravenous  PONV Risk Score and Plan: 1 and Ondansetron and Treatment may vary due to age or medical condition  Airway Management Planned: Oral ETT  Additional Equipment: None  Intra-op Plan:   Post-operative Plan: Extubation in OR  Informed Consent: I have reviewed the patients History and Physical, chart, labs and discussed the procedure including the risks, benefits and alternatives for the proposed anesthesia with the patient or authorized representative who has indicated his/her understanding and acceptance.       Plan Discussed with: CRNA, Surgeon and  Anesthesiologist  Anesthesia Plan Comments:        Anesthesia Quick Evaluation

## 2021-01-04 ENCOUNTER — Other Ambulatory Visit: Payer: Self-pay

## 2021-01-04 ENCOUNTER — Encounter (HOSPITAL_COMMUNITY): Payer: Self-pay | Admitting: General Surgery

## 2021-01-04 ENCOUNTER — Inpatient Hospital Stay (HOSPITAL_COMMUNITY): Payer: Medicare Other | Admitting: Physician Assistant

## 2021-01-04 ENCOUNTER — Inpatient Hospital Stay (HOSPITAL_COMMUNITY): Payer: Medicare Other | Admitting: Certified Registered"

## 2021-01-04 ENCOUNTER — Inpatient Hospital Stay (HOSPITAL_COMMUNITY)
Admission: RE | Admit: 2021-01-04 | Discharge: 2021-01-05 | DRG: 337 | Disposition: A | Discharge status: Home / Self Care | Payer: Medicare Other | Attending: General Surgery | Admitting: General Surgery

## 2021-01-04 ENCOUNTER — Encounter (HOSPITAL_COMMUNITY)
Admission: RE | Disposition: A | Discharge status: Home / Self Care | Payer: Self-pay | Source: Ambulatory Visit | Attending: General Surgery

## 2021-01-04 DIAGNOSIS — Z7984 Long term (current) use of oral hypoglycemic drugs: Secondary | ICD-10-CM | POA: Diagnosis not present

## 2021-01-04 DIAGNOSIS — K432 Incisional hernia without obstruction or gangrene: Principal | ICD-10-CM | POA: Diagnosis present

## 2021-01-04 DIAGNOSIS — F17210 Nicotine dependence, cigarettes, uncomplicated: Secondary | ICD-10-CM | POA: Diagnosis present

## 2021-01-04 DIAGNOSIS — Z888 Allergy status to other drugs, medicaments and biological substances status: Secondary | ICD-10-CM | POA: Diagnosis not present

## 2021-01-04 DIAGNOSIS — E785 Hyperlipidemia, unspecified: Secondary | ICD-10-CM | POA: Diagnosis present

## 2021-01-04 DIAGNOSIS — Z87892 Personal history of anaphylaxis: Secondary | ICD-10-CM

## 2021-01-04 DIAGNOSIS — K219 Gastro-esophageal reflux disease without esophagitis: Secondary | ICD-10-CM | POA: Diagnosis not present

## 2021-01-04 DIAGNOSIS — K660 Peritoneal adhesions (postprocedural) (postinfection): Secondary | ICD-10-CM | POA: Diagnosis not present

## 2021-01-04 DIAGNOSIS — Z20822 Contact with and (suspected) exposure to covid-19: Secondary | ICD-10-CM | POA: Diagnosis present

## 2021-01-04 DIAGNOSIS — Z83438 Family history of other disorder of lipoprotein metabolism and other lipidemia: Secondary | ICD-10-CM

## 2021-01-04 DIAGNOSIS — Z79899 Other long term (current) drug therapy: Secondary | ICD-10-CM | POA: Diagnosis not present

## 2021-01-04 DIAGNOSIS — Z833 Family history of diabetes mellitus: Secondary | ICD-10-CM | POA: Diagnosis not present

## 2021-01-04 DIAGNOSIS — Z880 Allergy status to penicillin: Secondary | ICD-10-CM | POA: Diagnosis not present

## 2021-01-04 DIAGNOSIS — K08109 Complete loss of teeth, unspecified cause, unspecified class: Secondary | ICD-10-CM | POA: Diagnosis present

## 2021-01-04 DIAGNOSIS — E1165 Type 2 diabetes mellitus with hyperglycemia: Secondary | ICD-10-CM | POA: Diagnosis not present

## 2021-01-04 DIAGNOSIS — G4730 Sleep apnea, unspecified: Secondary | ICD-10-CM | POA: Diagnosis not present

## 2021-01-04 DIAGNOSIS — Z794 Long term (current) use of insulin: Secondary | ICD-10-CM | POA: Diagnosis not present

## 2021-01-04 HISTORY — PX: INCISIONAL HERNIA REPAIR: SHX193

## 2021-01-04 LAB — GLUCOSE, CAPILLARY
Glucose-Capillary: 186 mg/dL — ABNORMAL HIGH (ref 70–99)
Glucose-Capillary: 280 mg/dL — ABNORMAL HIGH (ref 70–99)
Glucose-Capillary: 374 mg/dL — ABNORMAL HIGH (ref 70–99)
Glucose-Capillary: 295 mg/dL — ABNORMAL HIGH (ref 70–99)

## 2021-01-04 LAB — CBC
HCT: 45.2 % (ref 39.0–52.0)
Hemoglobin: 14.7 g/dL (ref 13.0–17.0)
MCH: 27.8 pg (ref 26.0–34.0)
MCHC: 32.5 g/dL (ref 30.0–36.0)
MCV: 85.6 fL (ref 80.0–100.0)
Platelets: 184 10*3/uL (ref 150–400)
RBC: 5.28 MIL/uL (ref 4.22–5.81)
RDW: 14.2 % (ref 11.5–15.5)
WBC: 11.5 10*3/uL — ABNORMAL HIGH (ref 4.0–10.5)
nRBC: 0 % (ref 0.0–0.2)

## 2021-01-04 LAB — CREATININE, SERUM
Creatinine, Ser: 1 mg/dL (ref 0.61–1.24)
GFR, Estimated: >60 mL/min (ref >60)

## 2021-01-04 SURGERY — REPAIR, HERNIA, INCISIONAL, LAPAROSCOPIC
Anesthesia: General | Site: Abdomen

## 2021-01-04 MED ORDER — CELECOXIB 200 MG PO CAPS
400.0000 mg | ORAL_CAPSULE | ORAL | Status: DC
  Filled 2021-01-04: qty 2

## 2021-01-04 MED ORDER — FENTANYL CITRATE (PF) 100 MCG/2ML IJ SOLN
INTRAMUSCULAR | Status: AC
  Filled 2021-01-04: qty 2

## 2021-01-04 MED ORDER — DEXTROSE-NACL 5-0.45 % IV SOLN: INTRAVENOUS | Status: DC

## 2021-01-04 MED ORDER — PHENYLEPHRINE HCL (PRESSORS) 10 MG/ML IV SOLN
INTRAVENOUS | Status: AC
  Filled 2021-01-04: qty 1

## 2021-01-04 MED ORDER — BUPIVACAINE-EPINEPHRINE (PF) 0.25% -1:200000 IJ SOLN
INTRAMUSCULAR | Status: AC
  Filled 2021-01-04: qty 30

## 2021-01-04 MED ORDER — TRAZODONE HCL 50 MG PO TABS
50.0000 mg | ORAL_TABLET | Freq: Every day | ORAL | Status: DC
  Administered 2021-01-04: 100 mg via ORAL
  Filled 2021-01-04: qty 2

## 2021-01-04 MED ORDER — OXYCODONE HCL 5 MG/5ML PO SOLN
5.0000 mg | Freq: Once | ORAL | Status: DC | PRN
Start: 2021-01-04 — End: 2021-01-04

## 2021-01-04 MED ORDER — PROPOFOL 10 MG/ML IV BOLUS
INTRAVENOUS | Status: AC
  Filled 2021-01-04: qty 40

## 2021-01-04 MED ORDER — LIDOCAINE 2% (20 MG/ML) 5 ML SYRINGE
INTRAMUSCULAR | Status: AC
  Filled 2021-01-04: qty 5

## 2021-01-04 MED ORDER — TRAMADOL HCL 50 MG PO TABS
50.0000 mg | ORAL_TABLET | Freq: Four times a day (QID) | ORAL | Status: DC | PRN
  Administered 2021-01-04: 50 mg via ORAL
  Filled 2021-01-04 (×2): qty 1

## 2021-01-04 MED ORDER — AMISULPRIDE (ANTIEMETIC) 5 MG/2ML IV SOLN: 10.0000 mg | Freq: Once | INTRAVENOUS | Status: DC | PRN

## 2021-01-04 MED ORDER — LISDEXAMFETAMINE DIMESYLATE 30 MG PO CAPS: 70.0000 mg | ORAL_CAPSULE | Freq: Every day | ORAL | Status: DC

## 2021-01-04 MED ORDER — ROCURONIUM BROMIDE 10 MG/ML (PF) SYRINGE
PREFILLED_SYRINGE | INTRAVENOUS | Status: DC | PRN
  Administered 2021-01-04: 20 mg via INTRAVENOUS
  Administered 2021-01-04: 10 mg via INTRAVENOUS
  Administered 2021-01-04: 70 mg via INTRAVENOUS
  Administered 2021-01-04: 10 mg via INTRAVENOUS

## 2021-01-04 MED ORDER — ORAL CARE MOUTH RINSE: 15.0000 mL | Freq: Once | OROMUCOSAL | Status: AC

## 2021-01-04 MED ORDER — BUPIVACAINE LIPOSOME 1.3 % IJ SUSP
INTRAMUSCULAR | Status: DC | PRN
  Administered 2021-01-04: 20 mL

## 2021-01-04 MED ORDER — ONDANSETRON HCL 4 MG/2ML IJ SOLN: 4.0000 mg | Freq: Four times a day (QID) | INTRAMUSCULAR | Status: DC | PRN

## 2021-01-04 MED ORDER — ENSURE PRE-SURGERY PO LIQD
296.0000 mL | Freq: Once | ORAL | Status: DC
  Filled 2021-01-04: qty 296

## 2021-01-04 MED ORDER — INSULIN ASPART 100 UNIT/ML ~~LOC~~ SOLN
0.0000 [IU] | SUBCUTANEOUS | Status: DC
  Administered 2021-01-04: 15 [IU] via SUBCUTANEOUS
  Administered 2021-01-04 – 2021-01-05 (×3): 8 [IU] via SUBCUTANEOUS
  Administered 2021-01-05: 5 [IU] via SUBCUTANEOUS

## 2021-01-04 MED ORDER — MIDAZOLAM HCL 2 MG/2ML IJ SOLN
INTRAMUSCULAR | Status: AC
  Filled 2021-01-04: qty 2

## 2021-01-04 MED ORDER — DEXMEDETOMIDINE (PRECEDEX) IN NS 20 MCG/5ML (4 MCG/ML) IV SYRINGE
PREFILLED_SYRINGE | INTRAVENOUS | Status: DC | PRN
  Administered 2021-01-04: 4 ug via INTRAVENOUS
  Administered 2021-01-04: 8 ug via INTRAVENOUS
  Administered 2021-01-04: 4 ug via INTRAVENOUS

## 2021-01-04 MED ORDER — FENTANYL CITRATE (PF) 250 MCG/5ML IJ SOLN
INTRAMUSCULAR | Status: DC | PRN
  Administered 2021-01-04 (×4): 50 ug via INTRAVENOUS
  Administered 2021-01-04: 100 ug via INTRAVENOUS

## 2021-01-04 MED ORDER — PROPOFOL 500 MG/50ML IV EMUL
INTRAVENOUS | Status: DC | PRN
  Administered 2021-01-04: 25 ug/kg/min via INTRAVENOUS

## 2021-01-04 MED ORDER — MORPHINE SULFATE (PF) 2 MG/ML IV SOLN
2.0000 mg | INTRAVENOUS | Status: DC | PRN
  Administered 2021-01-05: 2 mg via INTRAVENOUS
  Filled 2021-01-04: qty 1

## 2021-01-04 MED ORDER — LURASIDONE HCL 40 MG PO TABS
80.0000 mg | ORAL_TABLET | Freq: Every day | ORAL | Status: DC
  Administered 2021-01-04: 80 mg via ORAL
  Filled 2021-01-04: qty 2

## 2021-01-04 MED ORDER — CHLORHEXIDINE GLUCONATE CLOTH 2 % EX PADS: 6.0000 | MEDICATED_PAD | Freq: Once | CUTANEOUS | Status: DC

## 2021-01-04 MED ORDER — BUPIVACAINE-EPINEPHRINE 0.25% -1:200000 IJ SOLN
INTRAMUSCULAR | Status: DC | PRN
  Administered 2021-01-04: 30 mL

## 2021-01-04 MED ORDER — INSULIN ASPART 100 UNIT/ML ~~LOC~~ SOLN
SUBCUTANEOUS | Status: AC
  Filled 2021-01-04: qty 1

## 2021-01-04 MED ORDER — OXYCODONE HCL 5 MG PO TABS: 5.0000 mg | ORAL_TABLET | ORAL | 0 refills | Status: DC | PRN

## 2021-01-04 MED ORDER — EMPAGLIFLOZIN 25 MG PO TABS
25.0000 mg | ORAL_TABLET | Freq: Every day | ORAL | Status: DC
  Administered 2021-01-05: 25 mg via ORAL
  Filled 2021-01-04: qty 1

## 2021-01-04 MED ORDER — ROCURONIUM BROMIDE 10 MG/ML (PF) SYRINGE
PREFILLED_SYRINGE | INTRAVENOUS | Status: AC
  Filled 2021-01-04: qty 10

## 2021-01-04 MED ORDER — MIDAZOLAM HCL 2 MG/2ML IJ SOLN
INTRAMUSCULAR | Status: DC | PRN
  Administered 2021-01-04: 2 mg via INTRAVENOUS

## 2021-01-04 MED ORDER — ACETAMINOPHEN 500 MG PO TABS
1000.0000 mg | ORAL_TABLET | ORAL | Status: AC
  Administered 2021-01-04: 1000 mg via ORAL
  Filled 2021-01-04: qty 2

## 2021-01-04 MED ORDER — PANTOPRAZOLE SODIUM 40 MG PO TBEC
40.0000 mg | DELAYED_RELEASE_TABLET | Freq: Every day | ORAL | Status: DC
  Administered 2021-01-05: 40 mg via ORAL
  Filled 2021-01-04: qty 1

## 2021-01-04 MED ORDER — LURASIDONE HCL 40 MG PO TABS: 40.0000 mg | ORAL_TABLET | Freq: Every day | ORAL | Status: DC

## 2021-01-04 MED ORDER — LACTATED RINGERS IV SOLN: INTRAVENOUS | Status: DC

## 2021-01-04 MED ORDER — CHLORHEXIDINE GLUCONATE 0.12 % MT SOLN
15.0000 mL | Freq: Once | OROMUCOSAL | Status: AC
  Administered 2021-01-04: 15 mL via OROMUCOSAL

## 2021-01-04 MED ORDER — IBUPROFEN 800 MG PO TABS: 800.0000 mg | ORAL_TABLET | Freq: Three times a day (TID) | ORAL | 0 refills | Status: DC | PRN

## 2021-01-04 MED ORDER — ONDANSETRON HCL 4 MG/2ML IJ SOLN
INTRAMUSCULAR | Status: AC
  Filled 2021-01-04: qty 2

## 2021-01-04 MED ORDER — BUSPIRONE HCL 5 MG PO TABS
10.0000 mg | ORAL_TABLET | Freq: Three times a day (TID) | ORAL | Status: DC
  Administered 2021-01-04 – 2021-01-05 (×3): 10 mg via ORAL
  Filled 2021-01-04 (×3): qty 2

## 2021-01-04 MED ORDER — METOPROLOL TARTRATE 5 MG/5ML IV SOLN
5.0000 mg | Freq: Four times a day (QID) | INTRAVENOUS | Status: DC | PRN
  Filled 2021-01-04: qty 5

## 2021-01-04 MED ORDER — ONDANSETRON HCL 4 MG/2ML IJ SOLN
INTRAMUSCULAR | Status: DC | PRN
  Administered 2021-01-04: 4 mg via INTRAVENOUS

## 2021-01-04 MED ORDER — ONDANSETRON 4 MG PO TBDP: 4.0000 mg | ORAL_TABLET | Freq: Four times a day (QID) | ORAL | Status: DC | PRN

## 2021-01-04 MED ORDER — OXYCODONE HCL 5 MG PO TABS
5.0000 mg | ORAL_TABLET | ORAL | Status: DC | PRN
  Administered 2021-01-04: 10 mg via ORAL
  Administered 2021-01-04: 5 mg via ORAL
  Administered 2021-01-05 (×2): 10 mg via ORAL
  Filled 2021-01-04: qty 2
  Filled 2021-01-04: qty 1
  Filled 2021-01-04 (×2): qty 2

## 2021-01-04 MED ORDER — HEPARIN SODIUM (PORCINE) 5000 UNIT/ML IJ SOLN
5000.0000 [IU] | Freq: Once | INTRAMUSCULAR | Status: AC
  Administered 2021-01-04: 5000 [IU] via SUBCUTANEOUS
  Filled 2021-01-04: qty 1

## 2021-01-04 MED ORDER — SIMETHICONE 80 MG PO CHEW: 40.0000 mg | CHEWABLE_TABLET | Freq: Four times a day (QID) | ORAL | Status: DC | PRN

## 2021-01-04 MED ORDER — GABAPENTIN 300 MG PO CAPS
300.0000 mg | ORAL_CAPSULE | Freq: Every day | ORAL | Status: DC
  Administered 2021-01-04 – 2021-01-05 (×2): 300 mg via ORAL
  Filled 2021-01-04 (×2): qty 1

## 2021-01-04 MED ORDER — DEXAMETHASONE SODIUM PHOSPHATE 10 MG/ML IJ SOLN
INTRAMUSCULAR | Status: DC | PRN
  Administered 2021-01-04: 4 mg via INTRAVENOUS

## 2021-01-04 MED ORDER — HYDROMORPHONE HCL 1 MG/ML IJ SOLN
0.2500 mg | INTRAMUSCULAR | Status: DC | PRN
  Administered 2021-01-04: 0.5 mg via INTRAVENOUS
  Administered 2021-01-04 (×2): 0.25 mg via INTRAVENOUS

## 2021-01-04 MED ORDER — DEXAMETHASONE SODIUM PHOSPHATE 10 MG/ML IJ SOLN
INTRAMUSCULAR | Status: AC
  Filled 2021-01-04: qty 1

## 2021-01-04 MED ORDER — INSULIN GLARGINE 100 UNIT/ML ~~LOC~~ SOLN
10.0000 [IU] | Freq: Every day | SUBCUTANEOUS | Status: DC
  Administered 2021-01-04: 10 [IU] via SUBCUTANEOUS
  Filled 2021-01-04: qty 0.1

## 2021-01-04 MED ORDER — HYDROMORPHONE HCL 1 MG/ML IJ SOLN
INTRAMUSCULAR | Status: AC
  Filled 2021-01-04: qty 1

## 2021-01-04 MED ORDER — SUGAMMADEX SODIUM 200 MG/2ML IV SOLN
INTRAVENOUS | Status: DC | PRN
  Administered 2021-01-04: 250 mg via INTRAVENOUS

## 2021-01-04 MED ORDER — KETOROLAC TROMETHAMINE 30 MG/ML IJ SOLN
30.0000 mg | Freq: Four times a day (QID) | INTRAMUSCULAR | Status: DC | PRN
  Administered 2021-01-04 – 2021-01-05 (×2): 30 mg via INTRAVENOUS
  Filled 2021-01-04 (×2): qty 1

## 2021-01-04 MED ORDER — POLYETHYLENE GLYCOL 3350 17 G PO PACK: 17.0000 g | PACK | Freq: Every day | ORAL | Status: DC | PRN

## 2021-01-04 MED ORDER — SUGAMMADEX SODIUM 500 MG/5ML IV SOLN
INTRAVENOUS | Status: AC
  Filled 2021-01-04: qty 5

## 2021-01-04 MED ORDER — OXYCODONE HCL 5 MG PO TABS
5.0000 mg | ORAL_TABLET | Freq: Once | ORAL | Status: DC | PRN
Start: 2021-01-04 — End: 2021-01-04

## 2021-01-04 MED ORDER — PROPOFOL 10 MG/ML IV BOLUS
INTRAVENOUS | Status: DC | PRN
  Administered 2021-01-04: 200 mg via INTRAVENOUS

## 2021-01-04 MED ORDER — ENOXAPARIN SODIUM 40 MG/0.4ML ~~LOC~~ SOLN
40.0000 mg | SUBCUTANEOUS | Status: DC
  Administered 2021-01-05: 40 mg via SUBCUTANEOUS
  Filled 2021-01-04: qty 0.4

## 2021-01-04 MED ORDER — GABAPENTIN 300 MG PO CAPS
300.0000 mg | ORAL_CAPSULE | ORAL | Status: AC
  Administered 2021-01-04: 300 mg via ORAL
  Filled 2021-01-04: qty 1

## 2021-01-04 MED ORDER — CLINDAMYCIN PHOSPHATE 900 MG/50ML IV SOLN
900.0000 mg | INTRAVENOUS | Status: AC
  Administered 2021-01-04: 900 mg via INTRAVENOUS
  Filled 2021-01-04: qty 50

## 2021-01-04 MED ORDER — LIDOCAINE 2% (20 MG/ML) 5 ML SYRINGE
INTRAMUSCULAR | Status: DC | PRN
  Administered 2021-01-04: 40 mg via INTRAVENOUS

## 2021-01-04 SURGICAL SUPPLY — 46 items
ADH SKN CLS APL DERMABOND .7 (GAUZE/BANDAGES/DRESSINGS) ×1
APL PRP STRL LF DISP 70% ISPRP (MISCELLANEOUS) ×1
APL SKNCLS STERI-STRIP NONHPOA (GAUZE/BANDAGES/DRESSINGS) ×1
APPLIER CLIP 5 13 M/L LIGAMAX5 (MISCELLANEOUS)
APR CLP MED LRG 5 ANG JAW (MISCELLANEOUS)
BENZOIN TINCTURE PRP APPL 2/3 (GAUZE/BANDAGES/DRESSINGS) ×2 IMPLANT
BINDER ABDOMINAL 12 ML 46-62 (SOFTGOODS) ×1 IMPLANT
BNDG ADH 1X3 SHEER STRL LF (GAUZE/BANDAGES/DRESSINGS) ×6 IMPLANT
BNDG ADH THN 3X1 STRL LF (GAUZE/BANDAGES/DRESSINGS) ×3
CABLE HIGH FREQUENCY MONO STRZ (ELECTRODE) ×2 IMPLANT
CHLORAPREP W/TINT 26 (MISCELLANEOUS) ×2 IMPLANT
CLIP APPLIE 5 13 M/L LIGAMAX5 (MISCELLANEOUS) IMPLANT
COVER SURGICAL LIGHT HANDLE (MISCELLANEOUS) ×2 IMPLANT
COVER WAND RF STERILE (DRAPES) IMPLANT
DECANTER SPIKE VIAL GLASS SM (MISCELLANEOUS) ×2 IMPLANT
DERMABOND ADVANCED (GAUZE/BANDAGES/DRESSINGS) ×1
DERMABOND ADVANCED .7 DNX12 (GAUZE/BANDAGES/DRESSINGS) IMPLANT
DEVICE SECURE STRAP 25 ABSORB (INSTRUMENTS) ×2 IMPLANT
DRAIN CHANNEL 19F RND (DRAIN) IMPLANT
ELECT REM PT RETURN 15FT ADLT (MISCELLANEOUS) ×2 IMPLANT
EVACUATOR SILICONE 100CC (DRAIN) IMPLANT
GLOVE SURG POLYISO LF SZ7 (GLOVE) ×2 IMPLANT
GLOVE SURG UNDER POLY LF SZ7 (GLOVE) ×2 IMPLANT
GOWN STRL REUS W/TWL LRG LVL3 (GOWN DISPOSABLE) ×2 IMPLANT
GOWN STRL REUS W/TWL XL LVL3 (GOWN DISPOSABLE) ×4 IMPLANT
GRASPER SUT TROCAR 14GX15 (MISCELLANEOUS) ×2 IMPLANT
KIT BASIN OR (CUSTOM PROCEDURE TRAY) ×2 IMPLANT
KIT TURNOVER KIT A (KITS) ×2 IMPLANT
MESH VENTRALIGHT ST 10X13IN (Mesh General) ×1 IMPLANT
PENCIL SMOKE EVACUATOR (MISCELLANEOUS) IMPLANT
SCISSORS LAP 5X35 DISP (ENDOMECHANICALS) ×2 IMPLANT
SET IRRIG TUBING LAPAROSCOPIC (IRRIGATION / IRRIGATOR) IMPLANT
SET TUBE SMOKE EVAC HIGH FLOW (TUBING) ×2 IMPLANT
SHEARS HARMONIC ACE PLUS 36CM (ENDOMECHANICALS) ×1 IMPLANT
SLEEVE XCEL OPT CAN 5 100 (ENDOMECHANICALS) ×2 IMPLANT
STRIP CLOSURE SKIN 1/2X4 (GAUZE/BANDAGES/DRESSINGS) ×2 IMPLANT
SUT ETHILON 2 0 PS N (SUTURE) IMPLANT
SUT MNCRL AB 4-0 PS2 18 (SUTURE) ×2 IMPLANT
SUT NOVA NAB GS-21 0 18 T12 DT (SUTURE) ×5 IMPLANT
SUT PDS AB 0 CT 36 (SUTURE) ×3 IMPLANT
SUT PDS AB 0 CT1 36 (SUTURE) ×2 IMPLANT
SUT VICRYL 0 UR6 27IN ABS (SUTURE) IMPLANT
TOWEL OR 17X26 10 PK STRL BLUE (TOWEL DISPOSABLE) ×2 IMPLANT
TRAY LAPAROSCOPIC (CUSTOM PROCEDURE TRAY) ×2 IMPLANT
TROCAR BLADELESS OPT 5 100 (ENDOMECHANICALS) ×2 IMPLANT
TROCAR XCEL 12X100 BLDLESS (ENDOMECHANICALS) ×2 IMPLANT

## 2021-01-04 NOTE — Transfer of Care (Signed)
Immediate Anesthesia Transfer of Care Note  Patient: ROHNAN BARTLESON  Procedure(s) Performed: LAPAROSCOPIC INCISIONAL HERNIA REPAIR WITH MESH (N/A Abdomen)  Patient Location: PACU  Anesthesia Type:General  Level of Consciousness: drowsy and responds to stimulation  Airway & Oxygen Therapy: Patient Spontanous Breathing and Patient connected to face mask oxygen  Post-op Assessment: Report given to RN and Post -op Vital signs reviewed and stable  Post vital signs: Reviewed and stable  Last Vitals:  Vitals Value Taken Time  BP 162/103 01/04/21 1038  Temp    Pulse 98 01/04/21 1041  Resp 13 01/04/21 1041  SpO2 97 % 01/04/21 1041  Vitals shown include unvalidated device data.  Last Pain:  Vitals:   01/04/21 0616  TempSrc: Oral  PainSc:          Complications: No complications documented.

## 2021-01-04 NOTE — Op Note (Addendum)
Preoperative diagnosis: incisional hernia without obstruction or gangrene  Postoperative diagnosis: Same   Procedure: laparoscopic lysis of adhesions, laparoscopic incisional hernia repair with placement of underlay mesh  Surgeon: Gurney Maxin, M.D.  Surgery resident: Jerilee Hoh, M.D.  Anesthesia: General   Indications for procedure: [name] is a 40 y.o. male with multiple symptomatic hernias following colectomy and colostomy reversal.   Description of procedure: The patient was brought into the operative suite, placed supine. Anesthesia was administered with endotracheal tube. Patient was strapped in place and foot board was secured. Both arms were tucked. All pressure points were offloaded by foam padding. The patient was prepped and draped in the usual sterile fashion.  A small incision was made over the left subcostal area and 61mm trocar was placed with optical entry. Pneumoperitoneum was applied with high flow low pressure. The abdominal cavity was inspected and revealed several areas of adhesions between the abdominal wall, omentum and small bowel along the midline incision, epigastric area and side walls. 1 65mm trocar was placed in the mid left abdomen and 1 additional 25mm trocar was placed in the LLQ.  Bilateral TAP blocks were placed with Exparel/Marcaine mix.  Blunt dissection was used to remove most of the filmy adhesions with occasional sharp dissection. Cautery was used to provide hemostasis. Several hernia defects were identified and were filled with omentum and loops of small bowel. These areas were slowly dissected free and reduced. There were 5 separate, adjacent swiss-cheese type hernia defects that were each 2-4 cm across, and the overall area affected was approximately 15 x 12 cm. The lysis of adhesion took 75 minutes total. The largest defects were repaired with interrupted 0 PDS. A 25 x 30 cm ventralight mesh was inserted and placed beneath the fascia and peritoneum  to the repair the hernia. 10 transfascial 0 novafil sutures were used to secure the mesh in place and absorbable tackers were used to appose the mesh against the abdominal wall in all areas.  The abdominal contents were again inspected and hemostasis was intact.  An interrupted 0 PDS suture was used to close the fascial defect of the 25mm trocar site using suture passer. Pneumoperitoneum was removed, all trocar were removed. All incisions were closed with 4-0 monocryl subcuticular stitch. The incisions and transfascial suture sites were sealed with Dermabond. The patient woke from anesthesia and was brought to PACU in stable condition.  Findings: At least five adjacent 2-4 cm hernia defects at the old stoma sites and midline incision forming an overall swiss-cheese type hernia affecting an area of 15 x 12 cm   Specimen: none  Blood loss: 25 ml  Local anesthesia: 50 ml RDEYCXK:4.8% Marcaine  Complications: None   Implant: 25 x 30 cm ventralight ST mesh

## 2021-01-04 NOTE — Anesthesia Procedure Notes (Signed)
Procedure Name: Intubation Date/Time: 01/04/2021 7:38 AM Performed by: Eben Burow, CRNA Pre-anesthesia Checklist: Patient identified, Emergency Drugs available, Suction available, Patient being monitored and Timeout performed Patient Re-evaluated:Patient Re-evaluated prior to induction Oxygen Delivery Method: Circle system utilized Preoxygenation: Pre-oxygenation with 100% oxygen Induction Type: IV induction Ventilation: Mask ventilation without difficulty Laryngoscope Size: Mac and 4 Grade View: Grade I Tube type: Oral Tube size: 7.5 mm Number of attempts: 1 Airway Equipment and Method: Stylet Placement Confirmation: ETT inserted through vocal cords under direct vision,  positive ETCO2 and breath sounds checked- equal and bilateral Secured at: 22 cm Tube secured with: Tape Dental Injury: Teeth and Oropharynx as per pre-operative assessment

## 2021-01-04 NOTE — H&P (Signed)
[name] is an 40 y.o. male.   Chief Complaint: hernias HPI: 40 yo male with multiple hernias after colectomy and colostomy reversal. He continues to have abdominal pain and nausea  Past Medical History:  Diagnosis Date  . Allergy   . Anxiety   . Becker's muscular dystrophy ([place])   . Bipolar disorder ([place])    On disability  . Depression   . Diabetes mellitus without complication ([place])    per patient : under control with diet, does not monitor cbg at home   . GERD (gastroesophageal reflux disease)   . Headache(784.0)    daily headaches  . Hyperlipidemia   . Neuromuscular disorder ([place])    Beckers muscular dystrophy  . Obesity   . PTSD (post-traumatic stress disorder)   . Sleep apnea    Does not tolerate CPAP    Past Surgical History:  Procedure Laterality Date  . APPLICATION OF WOUND VAC N/A 11/23/2018   Procedure: Application Of Wound Vac;  Surgeon: Kiarah Eckstein, Arta Bruce, MD;  Location: [facility];  Service: General;  Laterality: N/A;  . COLECTOMY N/A 11/23/2018   Procedure: TOTAL COLECTOMY;  Surgeon: Kieth Brightly Arta Bruce, MD;  Location: [facility];  Service: General;  Laterality: N/A;  . ILEOSTOMY N/A 11/23/2018   Procedure: ILEOSTOMY;  Surgeon: Nashaly Dorantes, Arta Bruce, MD;  Location: [facility];  Service: General;  Laterality: N/A;  . ILEOSTOMY CLOSURE N/A 05/19/2019   Procedure: ILEOSTOMY REVERSAL WITH ILEOCOLIC AND COLORECTAL ANASTOMOSIS;  Surgeon: Iyah Laguna, Arta Bruce, MD;  Location: [facility];  Service: General;  Laterality: N/A;  . NM MYOCAR PERF WALL MOTION  01/22/2011   protocol: Persantine, moderate reversible inferior defect post stress EF 48%, high risk scan  . TONSILLECTOMY    . TRANSTHORACIC ECHOCARDIOGRAM  07/05/2004   EF=>55% normal study     Family History  Problem Relation Age of Onset  . COPD Mother   . Depression Mother   . Diabetes Mother   . Hyperlipidemia Mother   . Asthma Father   . Arthritis Father   . Diabetes Father   . Heart disease Father   . Hyperlipidemia  Father   . Hypertension Father   . Hypertension Brother    Social History:  reports that he has been smoking cigarettes. He has a 31.50 pack-year smoking history. He has never used smokeless tobacco. He reports previous drug use. Drug: Marijuana. He reports that he does not drink alcohol.  Allergies:  Allergies  Allergen Reactions  . Metformin And Related Other (See Comments)    Stomach cramps,  Throat closing sensation, could not swallow  . Penicillins Anaphylaxis, Hives, Shortness Of Breath and Swelling    Did it involve swelling of the face/tongue/throat, SOB, or low BP? Yes Did it involve sudden or severe rash/hives, skin peeling, or any reaction on the inside of your mouth or nose? Unk Did you need to seek medical attention at a hospital or doctor's office? No When did it last happen?Unk If all above answers are "NO", may proceed with cephalosporin use. Got ceftriaxone before   . Nsaids Other (See Comments)    States he is not supposed to take nsaids because of his colon     Medications Prior to Admission  Medication Sig Dispense Refill  . busPIRone (BUSPAR) 10 MG tablet Take 10 mg by mouth 3 (three) times daily.    . diphenhydramine-acetaminophen (TYLENOL PM) 25-500 MG TABS tablet Take 3 tablets by mouth at bedtime as needed (Sleep).    Marland Kitchen  empagliflozin (JARDIANCE) 25 MG TABS tablet Take 25 mg by mouth daily. 90 tablet 3  . gabapentin (NEURONTIN) 300 MG capsule Take 300 mg by mouth daily.   1  . insulin glargine (LANTUS) 100 UNIT/ML Solostar Pen Inject 40 Units into the skin every morning. Start at 10 units daily then increase by 1 unit daily. (Patient not taking: Reported on 12/25/2020) 3 mL 11  . lurasidone (LATUDA) 40 MG TABS tablet Take 40 tablets by mouth at bedtime.    Marland Kitchen omeprazole (PRILOSEC) 20 MG capsule TAKE 2 CAPSULES (40 MG TOTAL) BY MOUTH AT BEDTIME. 180 capsule 0  . ondansetron (ZOFRAN) 4 MG tablet Take 1 tablet (4 mg total) by mouth every 6 (six) hours. 12 tablet 0   . traZODone (DESYREL) 100 MG tablet Take 50-100 mg by mouth at bedtime.    Marland Kitchen VYVANSE 20 MG capsule Take 20 mg by mouth daily with breakfast. Take with 50 mg for a total of 70 mg    . VYVANSE 50 MG capsule Take 50 mg by mouth See admin instructions. Take with 20 for a total of 70 mg in the morning    . atorvastatin (LIPITOR) 40 MG tablet Take 1 tablet (40 mg total) by mouth daily. 90 tablet 1  . Blood Glucose Monitoring Suppl (ONE TOUCH ULTRA 2) w/Device KIT USE TWO TIMES A DAY 1 kit 0  . celecoxib (CELEBREX) 100 MG capsule Take 1 capsule (100 mg total) by mouth 2 (two) times daily. 30 capsule 0  . dicyclomine (BENTYL) 10 MG capsule Take 1 capsule (10 mg total) by mouth 4 (four) times daily -  before meals and at bedtime. 30 capsule 0  . glucose blood (ONETOUCH ULTRA) test strip USE TO TEST TWO TIMES A DAY 100 strip 3  . Insulin Pen Needle 32G X 6 MM MISC 1 packet by Does not apply route 2 (two) times daily. 1 each 4  . Lancets (ONETOUCH DELICA PLUS [id number]) MISC USE TO TEST TWO TIMES A DAY 100 each 0  . ondansetron (ZOFRAN ODT) 4 MG disintegrating tablet Take 1 tablet (4 mg total) by mouth every 8 (eight) hours as needed for nausea or vomiting. 20 tablet 0  . potassium chloride SA (KLOR-CON) 20 MEQ tablet Take 1 tablet (20 mEq total) by mouth 2 (two) times daily. 10 tablet 0  . tiZANidine (ZANAFLEX) 4 MG tablet Take 1 tablet (4 mg total) by mouth every 6 (six) hours as needed for muscle spasms. 30 tablet 0  . traMADol (ULTRAM) 50 MG tablet Take 0.5 tablets (25 mg total) by mouth every 6 (six) hours as needed. 15 tablet 0    Results for orders placed or performed during the hospital encounter of 01/04/21 (from the past 48 hour(s))  Glucose, capillary     Status: Abnormal   Collection Time: 01/04/21  5:49 AM  Result Value Ref Range   Glucose-Capillary 186 (H) 70 - 99 mg/dL    Comment: Glucose reference range applies only to samples taken after fasting for at least 8 hours.   No results  found.  Review of Systems  Constitutional: Negative for chills and fever.  HENT: Negative for hearing loss.   Respiratory: Negative for cough.   Cardiovascular: Negative for chest pain and palpitations.  Gastrointestinal: Positive for abdominal pain and nausea. Negative for vomiting.  Genitourinary: Negative for dysuria and urgency.  Musculoskeletal: Negative for myalgias and neck pain.  Skin: Negative for rash.  Neurological: Negative for dizziness and headaches.  Hematological: Does not bruise/bleed easily.  Psychiatric/Behavioral: Negative for suicidal ideas.    Blood pressure (!) 156/97, pulse 88, temperature 98.5 F (36.9 C), temperature source Oral, resp. rate 18, weight 106.6 kg, SpO2 97 %. Physical Exam Vitals reviewed.  Constitutional:      Appearance: He is well-developed.  HENT:     Head: Normocephalic and atraumatic.  Eyes:     Conjunctiva/sclera: Conjunctivae normal.     Pupils: Pupils are equal, round, and reactive to light.  Cardiovascular:     Rate and Rhythm: Normal rate and regular rhythm.  Pulmonary:     Effort: Pulmonary effort is normal.     Breath sounds: Normal breath sounds.  Abdominal:     General: Bowel sounds are normal. There is no distension.     Palpations: Abdomen is soft.     Tenderness: There is no abdominal tenderness.     Comments: Multiple abdominal hernias  Musculoskeletal:        General: Normal range of motion.     Cervical back: Normal range of motion and neck supple.  Skin:    General: Skin is warm and dry.  Neurological:     Mental Status: He is alert and oriented to person, place, and time.  Psychiatric:        Behavior: Behavior normal.      Assessment/Plan 40 yo male with incisional hernia -lap incisional hernia repair -inpatient admission -[facility] protocol  Mickeal Skinner, MD 01/04/2021, 7:27 AM

## 2021-01-04 NOTE — Anesthesia Postprocedure Evaluation (Signed)
Anesthesia Post Note  Patient: [name]  Procedure(s) Performed: LAPAROSCOPIC INCISIONAL HERNIA REPAIR WITH MESH; LYSIS OF ADHESIONS (N/A Abdomen)     Patient location during evaluation: PACU Anesthesia Type: General Level of consciousness: awake and alert Pain management: pain level controlled Vital Signs Assessment: post-procedure vital signs reviewed and stable Respiratory status: spontaneous breathing, nonlabored ventilation and respiratory function stable Cardiovascular status: blood pressure returned to baseline and stable Postop Assessment: no apparent nausea or vomiting Anesthetic complications: no   No complications documented.  Last Vitals:  Vitals:   01/04/21 1215 01/04/21 1232  BP: 123/86 (!) 144/92  Pulse: 76 86  Resp: 10   Temp: 36.4 C 36.7 C  SpO2: 96% 96%    Last Pain:  Vitals:   01/04/21 1232  TempSrc: Oral  PainSc:                  Merlinda Frederick

## 2021-01-05 LAB — CBC
HCT: 37.4 % — ABNORMAL LOW (ref 39.0–52.0)
Hemoglobin: 12.4 g/dL — ABNORMAL LOW (ref 13.0–17.0)
MCH: 28.2 pg (ref 26.0–34.0)
MCHC: 33.2 g/dL (ref 30.0–36.0)
MCV: 85 fL (ref 80.0–100.0)
Platelets: 203 10*3/uL (ref 150–400)
RBC: 4.4 MIL/uL (ref 4.22–5.81)
RDW: 14.4 % (ref 11.5–15.5)
WBC: 9.7 10*3/uL (ref 4.0–10.5)
nRBC: 0 % (ref 0.0–0.2)

## 2021-01-05 LAB — GLUCOSE, CAPILLARY
Glucose-Capillary: 244 mg/dL — ABNORMAL HIGH (ref 70–99)
Glucose-Capillary: 255 mg/dL — ABNORMAL HIGH (ref 70–99)
Glucose-Capillary: 207 mg/dL — ABNORMAL HIGH (ref 70–99)

## 2021-01-05 LAB — BASIC METABOLIC PANEL
Anion gap: 11 (ref 5–15)
BUN: 18 mg/dL (ref 6–20)
CO2: 25 mmol/L (ref 22–32)
Calcium: 8.4 mg/dL — ABNORMAL LOW (ref 8.9–10.3)
Chloride: 98 mmol/L (ref 98–111)
Creatinine, Ser: 0.82 mg/dL (ref 0.61–1.24)
GFR, Estimated: >60 mL/min (ref >60)
Glucose, Bld: 259 mg/dL — ABNORMAL HIGH (ref 70–99)
Potassium: 3.7 mmol/L (ref 3.5–5.1)
Sodium: 134 mmol/L — ABNORMAL LOW (ref 135–145)

## 2021-01-05 MED ORDER — LISDEXAMFETAMINE DIMESYLATE 30 MG PO CAPS
70.0000 mg | ORAL_CAPSULE | Freq: Every day | ORAL | Status: DC
  Administered 2021-01-05: 70 mg via ORAL
  Filled 2021-01-05: qty 1

## 2021-01-05 MED ORDER — LISDEXAMFETAMINE DIMESYLATE 30 MG PO CAPS: 70.0000 mg | ORAL_CAPSULE | Freq: Every day | ORAL | Status: DC

## 2021-01-05 NOTE — Discharge Summary (Signed)
Physician Discharge Summary  Patient ID: [name] MRN: [id number] DOB/AGE: [birth year] 40 y.o.  Admit date: 01/04/2021 Discharge date: 01/05/2021  Admission Diagnoses: Patient Active Problem List   Diagnosis Date Noted  . Incisional hernia 01/04/2021  . Ventral hernia 11/21/2020  . Anxiety   . Shoulder injury, subsequent encounter 06/29/2020  . High priority for COVID-19 virus vaccination 03/29/2020  . Type 2 diabetes mellitus with hyperglycemia ([place]) 03/01/2020  . Abdominal pain 11/22/2018  . Memory difficulty 10/04/2018  . Erectile dysfunction 01/29/2018  . Neck pain 11/12/2016  . Back pain 10/25/2016  . Allergic rhinitis 04/28/2016  . GERD (gastroesophageal reflux disease) 03/21/2013  . Elevated BP 04/25/2012  . Narcotic abuse ([place]) 06/05/2011  . OBESITY 10/17/2010  . 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 SLEEP APNEA 01/24/2009  . BIPOLAR DISORDER UNSPECIFIED 05/05/2008  . HYPERLIPIDEMIA 03/30/2008  . ABUSE, ALCOHOL, UNSPECIFIED 04/26/2007  . TOBACCO ABUSE 12/25/2006  ]  Discharge Diagnoses:  Active Problems:   Incisional hernia  And same as above.    Discharged Condition: stable  Hospital Course: Pt was admitted to the floor following laparoscopic lysis of adhesions and laparoscopic incisional hernia repair 01/04/21 by Dr. Kieth Brightly.  Pt did very well overnight.  He had good pain control. He denied nausea and tolerated a carb mod diet.  He was independently ambulatory.  He feels well.  He is having flatus.    Consults: None  Significant Diagnostic Studies: labs: HCT 37.4 POD 1  Treatments: surgery: see above  Discharge Exam: Blood pressure 120/75, pulse (!) 105, temperature 98.8 F (37.1 C), temperature source Oral, resp. rate 19, weight 106.6 kg, SpO2 91 %. General appearance: alert, cooperative and no distress Resp: breathing comfortably GI: soft, non distended, wound c/d/i.   Extremities: extremities normal, atraumatic, no cyanosis or edema  Disposition:  Discharge disposition: 01-Home or Self Care       Discharge Instructions    Call MD for:  difficulty breathing, headache or visual disturbances   Complete by: As directed    Call MD for:  persistant nausea and vomiting   Complete by: As directed    Call MD for:  redness, tenderness, or signs of infection (pain, swelling, redness, odor or green/yellow discharge around incision site)   Complete by: As directed    Call MD for:  severe uncontrolled pain   Complete by: As directed    Call MD for:  temperature >100.4   Complete by: As directed    Diet - low sodium heart healthy   Complete by: As directed    Increase activity slowly   Complete by: As directed      Allergies as of 01/05/2021      Reactions   Metformin And Related Other (See Comments)   Stomach cramps,  Throat closing sensation, could not swallow   Penicillins Anaphylaxis, Hives, Shortness Of Breath, Swelling   Did it involve swelling of the face/tongue/throat, SOB, or low BP? Yes Did it involve sudden or severe rash/hives, skin peeling, or any reaction on the inside of your mouth or nose? Unk Did you need to seek medical attention at a hospital or doctor's office? No When did it last happen?Unk If all above answers are "NO", may proceed with cephalosporin use. Got ceftriaxone before   Nsaids Other (See Comments)   States he is not supposed to take nsaids because of his colon       Medication List    STOP taking these medications   atorvastatin 40 MG  tablet Commonly known as: LIPITOR   celecoxib 100 MG capsule Commonly known as: CeleBREX   dicyclomine 10 MG capsule Commonly known as: Bentyl   ondansetron 4 MG disintegrating tablet Commonly known as: Zofran ODT   ondansetron 4 MG tablet Commonly known as: ZOFRAN   potassium chloride SA 20 MEQ tablet Commonly known as: KLOR-CON   tiZANidine 4 MG tablet Commonly known as: Zanaflex   traMADol 50 MG tablet Commonly known as: ULTRAM     TAKE these  medications   busPIRone 10 MG tablet Commonly known as: BUSPAR Take 10 mg by mouth 3 (three) times daily.   diphenhydramine-acetaminophen 25-500 MG Tabs tablet Commonly known as: TYLENOL PM Take 3 tablets by mouth at bedtime as needed (Sleep).   empagliflozin 25 MG Tabs tablet Commonly known as: JARDIANCE Take 25 mg by mouth daily.   gabapentin 300 MG capsule Commonly known as: NEURONTIN Take 300 mg by mouth daily as needed (pain).   ibuprofen 800 MG tablet Commonly known as: ADVIL Take 1 tablet (800 mg total) by mouth every 8 (eight) hours as needed.   insulin glargine 100 UNIT/ML Solostar Pen Commonly known as: LANTUS Inject 40 Units into the skin every morning. Start at 10 units daily then increase by 1 unit daily.   Insulin Pen Needle 32G X 6 MM Misc 1 packet by Does not apply route 2 (two) times daily.   lurasidone 40 MG Tabs tablet Commonly known as: LATUDA Take 1 tablet (40 mg total) by mouth at bedtime. What changed: how much to take   omeprazole 20 MG capsule Commonly known as: PRILOSEC TAKE 2 CAPSULES (40 MG TOTAL) BY MOUTH AT BEDTIME.   ONE TOUCH ULTRA 2 w/Device Kit USE TWO TIMES A DAY   OneTouch Delica Plus [id number] Misc USE TO TEST TWO TIMES A DAY   OneTouch Ultra test strip Generic drug: glucose blood USE TO TEST TWO TIMES A DAY   oxyCODONE 5 MG immediate release tablet Commonly known as: Oxy IR/ROXICODONE Take 1-2 tablets (5-10 mg total) by mouth every 4 (four) hours as needed for moderate pain.   traZODone 100 MG tablet Commonly known as: DESYREL Take 50-100 mg by mouth at bedtime.   Trulicity 3.15 VV/6.1YW Sopn Generic drug: Dulaglutide Inject 0.75 mg into the skin once a week.   Vyvanse 50 MG capsule Generic drug: lisdexamfetamine Take 50 mg by mouth See admin instructions. Take with 20 mg for a total of 70 mg in the morning   Vyvanse 20 MG capsule Generic drug: lisdexamfetamine Take 20 mg by mouth daily with breakfast. Take with 50  mg for a total of 70 mg       Follow-up Information    Kinsinger, Arta Bruce, MD Follow up in 2 week(s).   Specialty: General Surgery Contact information: [address] [place] [address] [phone]               Signed: Stark Klein 01/05/2021, 11:27 AM

## 2021-01-05 NOTE — Discharge Instructions (Signed)
[facility]      [facility] [phone]  OPEN ABDOMINAL SURGERY: POST OP INSTRUCTIONS  Always review your discharge instruction sheet given to you by the facility where your surgery was performed.  IF YOU HAVE DISABILITY OR FAMILY LEAVE FORMS, YOU MUST BRING THEM TO THE OFFICE FOR PROCESSING.  PLEASE DO NOT GIVE THEM TO YOUR DOCTOR.  1. A prescription for pain medication may be given to you upon discharge.  Take your pain medication as prescribed, if needed.  If narcotic pain medicine is not needed, then you may take acetaminophen (Tylenol) or ibuprofen (Advil) as needed. 2. Take your usually prescribed medications unless otherwise directed. 3. If you need a refill on your pain medication, please contact your pharmacy. They will contact our office to request authorization.  Prescriptions will not be filled after 5pm or on week-ends. 4. You should follow a light diet the first few days after arrival home, such as soup and crackers, pudding, etc.unless your doctor has advised otherwise. A high-fiber, low fat diet can be resumed as tolerated.   Be sure to include lots of fluids daily. Most patients will experience some swelling and bruising on the chest and neck area.  Ice packs will help.  Swelling and bruising can take several days to resolve 5. Most patients will experience some swelling and bruising in the area of the incision. Ice pack will help. Swelling and bruising can take several days to resolve..  6. It is common to experience some constipation if taking pain medication after surgery.  Increasing fluid intake and taking a stool softener will usually help or prevent this problem from occurring.  A mild laxative (Milk of Magnesia or Miralax) should be taken according to package directions if there are no bowel movements after 48 hours. 7.  You may have steri-strips (small skin tapes) in place directly over the incision.  These strips should be left on the skin for 7-10 days.  If your  surgeon used skin glue on the incision, you may shower in 24 hours.  The glue will flake off over the next 2-3 weeks.  Any sutures or staples will be removed at the office during your follow-up visit. You may find that a light gauze bandage over your incision may keep your staples from being rubbed or pulled. You may shower and replace the bandage daily. 8. ACTIVITIES:  You may resume regular (light) daily activities beginning the next day--such as daily self-care, walking, climbing stairs--gradually increasing activities as tolerated.  You may have sexual intercourse when it is comfortable.  Refrain from any heavy lifting or straining until approved by your doctor. a. You may drive when you no longer are taking prescription pain medication, you can comfortably wear a seatbelt, and you can safely maneuver your car and apply brakes b. Return to Work: ___________________________________ 9. You should see your doctor in the office for a follow-up appointment approximately two weeks after your surgery.  Make sure that you call for this appointment within a day or two after you arrive home to insure a convenient appointment time. OTHER INSTRUCTIONS:  _____________________________________________________________ _____________________________________________________________  WHEN TO CALL YOUR DOCTOR: 1. Fever over 101.0 2. Inability to urinate 3. Nausea and/or vomiting 4. Extreme swelling or bruising 5. Continued bleeding from incision. 6. Increased pain, redness, or drainage from the incision. 7. Difficulty swallowing or breathing 8. Muscle cramping or spasms. 9. Numbness or tingling in hands or feet or around lips.  The clinic staff is available to   answer your questions during regular business hours.  Please don't hesitate to call and ask to speak to one of the nurses if you have concerns.  For further questions, please visit [email or web address]   

## 2021-01-05 NOTE — Evaluation (Signed)
Physical Therapy Evaluation Patient Details Name: [name] MRN: [id number] DOB: Dec 03, 1980 Today's Date: 01/05/2021   History of Present Illness  Pt s/p lap incisional hernia repair 2* multiple hernias following colectomy and colostomy reversal.  Pt with hx of PTSD, bipolar and Becks muscular dystrophy  Clinical Impression  Pt admitted as above and presenting with functional mobility limitations 2* post op abdominal pain.  Pt currently ambulating at Mod I level with cane and requiring min assist only for bed mobility and transfers.  Spouse in room providing assistance "just like we did after his last surgery".  Pt and spouse report feeling comfortable with ability to manage at home and are eager for dc this pm.    Follow Up Recommendations No PT follow up    Equipment Recommendations  None recommended by PT    Recommendations for Other Services       Precautions / Restrictions Precautions Precautions: Fall Required Braces or Orthoses: Other Brace Other Brace: abdominal binder in place Restrictions Weight Bearing Restrictions: No      Mobility  Bed Mobility Overal bed mobility: Needs Assistance             General bed mobility comments: Pt sitting EOB on arrival and states spouse helped him from supine "just like we did it on my last surgery"    Transfers Overall transfer level: Needs assistance Equipment used: 1 person hand held assist Transfers: Sit to/from Stand Sit to Stand: Min assist         General transfer comment: spouse assisting pt "just like we did it last time  Ambulation/Gait Ambulation/Gait assistance: Supervision;Modified independent (Device/Increase time) Gait Distance (Feet): 300 Feet Assistive device: Rolling walker (2 wheeled);Straight cane Gait Pattern/deviations: Step-through pattern;Decreased step length - right;Decreased step length - left;Shuffle;Wide base of support Gait velocity: decr   General Gait Details: 100' with RW and  additional 200' with SPC.  Mild instability but no LOB and good safety awareness.  Stairs            Wheelchair Mobility    Modified Rankin (Stroke Patients Only)       Balance Overall balance assessment: Mild deficits observed, not formally tested                                           Pertinent Vitals/Pain Pain Assessment: Faces Faces Pain Scale: Hurts little more Pain Location: abdomen Pain Descriptors / Indicators: Sore Pain Intervention(s): Limited activity within patient's tolerance;Monitored during session;Premedicated before session    Home Living Family/patient expects to be discharged to:: Private residence Living Arrangements: Spouse/significant other;Children Available Help at Discharge: Family Type of Home: House Home Access: Stairs to enter Entrance Stairs-Rails: None Entrance Stairs-Number of Steps: 2 Home Layout: One level Home Equipment: Cane - single point;Bedside commode      Prior Function Level of Independence: Independent               Hand Dominance        Extremity/Trunk Assessment   Upper Extremity Assessment Upper Extremity Assessment: Overall WFL for tasks assessed    Lower Extremity Assessment Lower Extremity Assessment: Overall WFL for tasks assessed    Cervical / Trunk Assessment Cervical / Trunk Assessment: Normal  Communication   Communication: No difficulties  Cognition Arousal/Alertness: Awake/alert Behavior During Therapy: WFL for tasks assessed/performed Overall Cognitive Status: Within Functional Limits for tasks assessed  General Comments      Exercises     Assessment/Plan    PT Assessment Patent does not need any further PT services  PT Problem List         PT Treatment Interventions      PT Goals (Current goals can be found in the Care Plan section)  Acute Rehab PT Goals Patient Stated Goal: Regain 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 PT Goal Formulation: All assessment and education complete, DC therapy (Pt dc order in place)    Frequency     Barriers to discharge        Co-evaluation               AM-PAC PT "6 Clicks" Mobility  Outcome Measure Help needed turning from your back to your side while in a flat bed without using bedrails?: A Little Help needed moving from lying on your back to sitting on the side of a flat bed without using bedrails?: A Little Help needed moving to and from a bed to a chair (including a wheelchair)?: A Little Help needed standing up from a chair using your arms (e.g., wheelchair or bedside chair)?: A Little Help needed to walk in hospital room?: A Little Help needed climbing 3-5 steps with a railing? : A Little 6 Click Score: 18    End of Session   Activity Tolerance: Patient tolerated treatment well Patient left: Other (comment) (sitting EOB) Nurse Communication: Mobility status PT Visit Diagnosis: Difficulty in walking, not elsewhere classified (R26.2);Unsteadiness on feet (R26.81)    Time: 5501-5868 PT Time Calculation (min) (ACUTE ONLY): 15 min   Charges:   PT Evaluation $PT Eval Low Complexity: 1 Low          [place] Pager [phone] Office [phone]   Jodey Burbano 01/05/2021, 12:15 PM

## 2021-01-05 NOTE — Progress Notes (Signed)
Reviewed written d/c instructions w pt and all questions answered. Pt verb. Understanding. D/c per w/c w all belongings in stable condition.

## 2021-01-06 ENCOUNTER — Encounter (HOSPITAL_COMMUNITY): Payer: Self-pay | Admitting: General Surgery

## 2021-01-08 ENCOUNTER — Other Ambulatory Visit: Payer: Self-pay | Admitting: Family Medicine

## 2021-01-08 ENCOUNTER — Other Ambulatory Visit: Payer: Self-pay | Admitting: Student in an Organized Health Care Education/Training Program

## 2021-01-08 DIAGNOSIS — E119 Type 2 diabetes mellitus without complications: Secondary | ICD-10-CM

## 2021-01-08 MED ORDER — ONETOUCH ULTRA 2 W/DEVICE KIT: PACK | 0 refills | Status: DC

## 2021-01-09 ENCOUNTER — Other Ambulatory Visit: Payer: Self-pay | Admitting: Student in an Organized Health Care Education/Training Program

## 2021-01-14 ENCOUNTER — Encounter (HOSPITAL_COMMUNITY): Payer: Self-pay | Admitting: *Deleted

## 2021-01-14 ENCOUNTER — Other Ambulatory Visit: Payer: Self-pay | Admitting: Student in an Organized Health Care Education/Training Program

## 2021-01-14 ENCOUNTER — Emergency Department (HOSPITAL_COMMUNITY)
Admission: EM | Admit: 2021-01-14 | Discharge: 2021-01-14 | Disposition: A | Discharge status: Home / Self Care | Payer: Medicare Other | Attending: Emergency Medicine | Admitting: Emergency Medicine

## 2021-01-14 ENCOUNTER — Other Ambulatory Visit: Payer: Self-pay

## 2021-01-14 DIAGNOSIS — R109 Unspecified abdominal pain: Secondary | ICD-10-CM | POA: Diagnosis not present

## 2021-01-14 DIAGNOSIS — G8918 Other acute postprocedural pain: Secondary | ICD-10-CM | POA: Diagnosis not present

## 2021-01-14 DIAGNOSIS — Z5321 Procedure and treatment not carried out due to patient leaving prior to being seen by health care provider: Secondary | ICD-10-CM | POA: Insufficient documentation

## 2021-01-14 NOTE — ED Triage Notes (Addendum)
Abdominal surgery 10 days ago, here for pain in op site, did not contact surgeon

## 2021-01-14 NOTE — ED Notes (Signed)
Left at 1700

## 2021-01-14 NOTE — ED Notes (Signed)
Called, no answer.

## 2021-01-17 ENCOUNTER — Other Ambulatory Visit: Payer: Self-pay | Admitting: Student in an Organized Health Care Education/Training Program

## 2021-01-24 ENCOUNTER — Encounter: Payer: Self-pay | Admitting: Student in an Organized Health Care Education/Training Program

## 2021-01-29 ENCOUNTER — Emergency Department (HOSPITAL_COMMUNITY): Payer: Medicare Other

## 2021-01-29 ENCOUNTER — Emergency Department (HOSPITAL_COMMUNITY)
Admission: EM | Admit: 2021-01-29 | Discharge: 2021-01-29 | Disposition: A | Discharge status: Home / Self Care | Payer: Medicare Other | Attending: Emergency Medicine | Admitting: Emergency Medicine

## 2021-01-29 ENCOUNTER — Encounter (HOSPITAL_COMMUNITY): Payer: Self-pay | Admitting: Emergency Medicine

## 2021-01-29 ENCOUNTER — Other Ambulatory Visit: Payer: Self-pay

## 2021-01-29 DIAGNOSIS — Z794 Long term (current) use of insulin: Secondary | ICD-10-CM | POA: Insufficient documentation

## 2021-01-29 DIAGNOSIS — K219 Gastro-esophageal reflux disease without esophagitis: Secondary | ICD-10-CM | POA: Insufficient documentation

## 2021-01-29 DIAGNOSIS — R1032 Left lower quadrant pain: Secondary | ICD-10-CM | POA: Diagnosis not present

## 2021-01-29 DIAGNOSIS — R110 Nausea: Secondary | ICD-10-CM | POA: Diagnosis not present

## 2021-01-29 DIAGNOSIS — E119 Type 2 diabetes mellitus without complications: Secondary | ICD-10-CM | POA: Diagnosis not present

## 2021-01-29 DIAGNOSIS — R109 Unspecified abdominal pain: Secondary | ICD-10-CM | POA: Diagnosis not present

## 2021-01-29 DIAGNOSIS — F17210 Nicotine dependence, cigarettes, uncomplicated: Secondary | ICD-10-CM | POA: Insufficient documentation

## 2021-01-29 LAB — CBC WITH DIFFERENTIAL/PLATELET
Abs Immature Granulocytes: 0.03 10*3/uL (ref 0.00–0.07)
Basophils Absolute: 0.1 10*3/uL (ref 0.0–0.1)
Basophils Relative: 1 %
Eosinophils Absolute: 0.6 10*3/uL — ABNORMAL HIGH (ref 0.0–0.5)
Eosinophils Relative: 7 %
HCT: 39.1 % (ref 39.0–52.0)
Hemoglobin: 12.5 g/dL — ABNORMAL LOW (ref 13.0–17.0)
Immature Granulocytes: 0 %
Lymphocytes Relative: 26 %
Lymphs Abs: 2.6 10*3/uL (ref 0.7–4.0)
MCH: 26.4 pg (ref 26.0–34.0)
MCHC: 32 g/dL (ref 30.0–36.0)
MCV: 82.5 fL (ref 80.0–100.0)
Monocytes Absolute: 0.6 10*3/uL (ref 0.1–1.0)
Monocytes Relative: 6 %
Neutro Abs: 6 10*3/uL (ref 1.7–7.7)
Neutrophils Relative %: 60 %
Platelets: 310 10*3/uL (ref 150–400)
RBC: 4.74 MIL/uL (ref 4.22–5.81)
RDW: 14.2 % (ref 11.5–15.5)
WBC: 9.9 10*3/uL (ref 4.0–10.5)
nRBC: 0 % (ref 0.0–0.2)

## 2021-01-29 LAB — COMPREHENSIVE METABOLIC PANEL
ALT: 16 U/L (ref 0–44)
AST: 15 U/L (ref 15–41)
Albumin: 3.9 g/dL (ref 3.5–5.0)
Alkaline Phosphatase: 87 U/L (ref 38–126)
Anion gap: 10 (ref 5–15)
BUN: 11 mg/dL (ref 6–20)
CO2: 27 mmol/L (ref 22–32)
Calcium: 9.1 mg/dL (ref 8.9–10.3)
Chloride: 98 mmol/L (ref 98–111)
Creatinine, Ser: 0.75 mg/dL (ref 0.61–1.24)
GFR, Estimated: >60 mL/min (ref >60)
Glucose, Bld: 254 mg/dL — ABNORMAL HIGH (ref 70–99)
Potassium: 3.5 mmol/L (ref 3.5–5.1)
Sodium: 135 mmol/L (ref 135–145)
Total Bilirubin: 0.5 mg/dL (ref 0.3–1.2)
Total Protein: 7.8 g/dL (ref 6.5–8.1)

## 2021-01-29 LAB — URINALYSIS, ROUTINE W REFLEX MICROSCOPIC
Bilirubin Urine: NEGATIVE
Glucose, UA: 50 mg/dL — AB
Hgb urine dipstick: NEGATIVE
Ketones, ur: NEGATIVE mg/dL
Leukocytes,Ua: NEGATIVE
Nitrite: NEGATIVE
Protein, ur: NEGATIVE mg/dL
Specific Gravity, Urine: >1.046 — ABNORMAL HIGH (ref 1.005–1.030)
pH: 6 (ref 5.0–8.0)

## 2021-01-29 LAB — LIPASE, BLOOD: Lipase: 30 U/L (ref 11–51)

## 2021-01-29 LAB — LACTIC ACID, PLASMA: Lactic Acid, Venous: 1 mmol/L (ref 0.5–1.9)

## 2021-01-29 MED ORDER — METRONIDAZOLE 500 MG PO TABS: 500.0000 mg | ORAL_TABLET | Freq: Three times a day (TID) | ORAL | 0 refills | Status: AC

## 2021-01-29 MED ORDER — OXYCODONE HCL 5 MG PO TABS: 5.0000 mg | ORAL_TABLET | Freq: Four times a day (QID) | ORAL | 0 refills | Status: DC | PRN

## 2021-01-29 MED ORDER — IOHEXOL 300 MG/ML  SOLN
100.0000 mL | Freq: Once | INTRAMUSCULAR | Status: AC | PRN
  Administered 2021-01-29: 100 mL via INTRAVENOUS

## 2021-01-29 MED ORDER — METRONIDAZOLE 500 MG PO TABS
500.0000 mg | ORAL_TABLET | Freq: Once | ORAL | Status: AC
  Administered 2021-01-29: 500 mg via ORAL
  Filled 2021-01-29: qty 1

## 2021-01-29 MED ORDER — HYDROMORPHONE HCL 1 MG/ML IJ SOLN
1.0000 mg | Freq: Once | INTRAMUSCULAR | Status: AC
  Administered 2021-01-29: 1 mg via INTRAVENOUS
  Filled 2021-01-29: qty 1

## 2021-01-29 MED ORDER — CIPROFLOXACIN HCL 250 MG PO TABS
500.0000 mg | ORAL_TABLET | Freq: Once | ORAL | Status: AC
  Administered 2021-01-29: 500 mg via ORAL
  Filled 2021-01-29: qty 2

## 2021-01-29 MED ORDER — CIPROFLOXACIN HCL 500 MG PO TABS: 500.0000 mg | ORAL_TABLET | Freq: Two times a day (BID) | ORAL | 0 refills | Status: AC

## 2021-01-29 NOTE — ED Triage Notes (Signed)
Burning sensation to LLQ at the site he just recently had hernia surgery.

## 2021-01-29 NOTE — ED Triage Notes (Signed)
Emergency Medicine Provider Triage Evaluation Note  [name] , a 40 y.o. male  was evaluated in triage.  Pt complains of left lower abdominal pain.  History of multiple hernias after colectomy and colostomy reversal.  Describes an intense burning sensation that is constant and nonradiating.  He is status post laparoscopic incisional hernia repair 01/04/2021.  Pain is worse with lying supine and improves with leaning forward.  Pain is associated with nausea.  Denies vomiting, shortness of breath, back pain or chest pain.  Review of Systems  Positive: Left abdominal pain, nausea Negative: Vomiting, dysuria, diarrhea  Physical Exam  BP (!) 143/96 (BP Location: Right Arm)   Pulse (!) 110   Temp 99.1 F (37.3 C) (Oral)   Resp 18   SpO2 100%  Gen:   Awake, no distress   HEENT:  Atraumatic  Resp:  Normal effort, lungs clear  Cardiac:  Normal rate and rhythm Abd:   Tender left lower quadrant, multiple abdominal scars MSK:   Moves extremities without difficulty  Neuro:  Speech clear   Medical Decision Making  Medically screening exam initiated at 5:43 PM.  Appropriate orders placed.  [name] was informed that the remainder of the evaluation will be completed by another provider, this initial triage assessment does not replace that evaluation, and the importance of remaining in the ED until their evaluation is complete.  Clinical Impression   Low grade fever here, LLQ burning pain, nausea, s/p incisional hernia repair 1 month ago. We will need to rule out infection versus obstruction.  Patient will need further ER evaluation.   Kem Parkinson, PA-C 01/29/21 1753

## 2021-01-29 NOTE — ED Provider Notes (Signed)
[facility] EMERGENCY DEPARTMENT Provider Note   CSN: [id number] Arrival date & time: 01/29/21  1654     History CC Abdominal pain  [name] is a 40 y.o. male presented to ED with abdominal pain.  He reports symptoms for 1 week.  He reports a burning sensation in his left lower quadrant.  It radiates towards his back.  He has never had this before.  It is worse with walking.  Nothing makes it better.  He has not had a hard time sleeping for the past 2 days.  He denies dysuria, hematuria.  Reports some nausea but denies vomiting.  He reports regular bowel movements.  History of a right-sided hernia repair approximately 1 month ago.  He is also had prior surgeries for bowel resection and had a colectomy and subsequent colostomy reversal in the past.  HPI     Past Medical History:  Diagnosis Date  . Allergy   . Anxiety   . Becker's muscular dystrophy (HCC)   . Bipolar disorder (HCC)    On disability  . Depression   . Diabetes mellitus without complication (HCC)    per patient : under control with diet, does not monitor cbg at home   . GERD (gastroesophageal reflux disease)   . Headache(784.0)    daily headaches  . Hyperlipidemia   . Neuromuscular disorder (HCC)    Beckers muscular dystrophy  . Obesity   . PTSD (post-traumatic stress disorder)   . Sleep apnea    Does not tolerate CPAP    Patient Active Problem List   Diagnosis Date Noted  . Incisional hernia 01/04/2021  . Ventral hernia 11/21/2020  . Anxiety   . Shoulder injury, subsequent encounter 06/29/2020  . High priority for COVID-19 virus vaccination 03/29/2020  . Type 2 diabetes mellitus with hyperglycemia (HCC) 03/01/2020  . Abdominal pain 11/22/2018  . Memory difficulty 10/04/2018  . Erectile dysfunction 01/29/2018  . Neck pain 11/12/2016  . Back pain 10/25/2016  . Allergic rhinitis 04/28/2016  . GERD (gastroesophageal reflux disease) 03/21/2013  . Elevated BP 04/25/2012  . Narcotic abuse (HCC)  06/05/2011  . OBESITY 10/17/2010  . 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 SLEEP APNEA 01/24/2009  . BIPOLAR DISORDER UNSPECIFIED 05/05/2008  . HYPERLIPIDEMIA 03/30/2008  . ABUSE, ALCOHOL, UNSPECIFIED 04/26/2007  . TOBACCO ABUSE 12/25/2006    Past Surgical History:  Procedure Laterality Date  . APPLICATION OF WOUND VAC N/A 11/23/2018   Procedure: Application Of Wound Vac;  Surgeon: Kinsinger, De Blanch, MD;  Location: [facility] OR;  Service: General;  Laterality: N/A;  . COLECTOMY N/A 11/23/2018   Procedure: TOTAL COLECTOMY;  Surgeon: Sheliah Hatch De Blanch, MD;  Location: [facility] OR;  Service: General;  Laterality: N/A;  . ILEOSTOMY N/A 11/23/2018   Procedure: ILEOSTOMY;  Surgeon: Kinsinger, De Blanch, MD;  Location: [facility] OR;  Service: General;  Laterality: N/A;  . ILEOSTOMY CLOSURE N/A 05/19/2019   Procedure: ILEOSTOMY REVERSAL WITH ILEOCOLIC AND COLORECTAL ANASTOMOSIS;  Surgeon: Kinsinger, De Blanch, MD;  Location: [facility] ORS;  Service: General;  Laterality: N/A;  . INCISIONAL HERNIA REPAIR N/A 01/04/2021   Procedure: LAPAROSCOPIC INCISIONAL HERNIA REPAIR WITH MESH; LYSIS OF ADHESIONS;  Surgeon: Kinsinger, De Blanch, MD;  Location: [facility] ORS;  Service: General;  Laterality: N/A;  . NM MYOCAR PERF WALL MOTION  01/22/2011   protocol: Persantine, moderate reversible inferior defect post stress EF 48%, high risk scan  . TONSILLECTOMY    . TRANSTHORACIC ECHOCARDIOGRAM  07/05/2004   EF=>55% normal study  Family History  Problem Relation Age of Onset  . COPD Mother   . Depression Mother   . Diabetes Mother   . Hyperlipidemia Mother   . Asthma Father   . Arthritis Father   . Diabetes Father   . Heart disease Father   . Hyperlipidemia Father   . Hypertension Father   . Hypertension Brother     Social History   Tobacco Use  . Smoking status: Current Every Day Smoker    Packs/day: 1.50    Years: 21.00    Pack years: 31.50    Types: Cigarettes  . Smokeless tobacco: Never Used  Vaping Use  .  Vaping Use: Former  . Devices: Increased nicotine cravings  Substance Use Topics  . Alcohol use: No    Alcohol/week: 0.0 standard drinks    Comment: quit 2013  . Drug use: Not Currently    Types: Marijuana    Comment: 7-27    Home Medications Prior to Admission medications   Medication Sig Start Date End Date Taking? Authorizing Provider  ciprofloxacin (CIPRO) 500 MG tablet Take 1 tablet (500 mg total) by mouth every 12 (twelve) hours for 7 days. 01/29/21 02/05/21 Yes Sybrina Laning, Carola Rhine, MD  metroNIDAZOLE (FLAGYL) 500 MG tablet Take 1 tablet (500 mg total) by mouth 3 (three) times daily for 7 days. 01/29/21 02/05/21 Yes Leonor Darnell, Carola Rhine, MD  oxyCODONE (ROXICODONE) 5 MG immediate release tablet Take 1 tablet (5 mg total) by mouth every 6 (six) hours as needed for up to 12 doses for severe pain. 01/29/21  Yes Lakelynn Severtson, Carola Rhine, MD  Blood Glucose Monitoring Suppl (ONE TOUCH ULTRA 2) w/Device KIT Measure blood sugar daily and with meals for insulin 01/08/21   Anderson, Chelsey L, DO  busPIRone (BUSPAR) 10 MG tablet Take 10 mg by mouth 3 (three) times daily. Patient not taking: Reported on 01/04/2021 10/25/20   [provider]  diphenhydramine-acetaminophen (TYLENOL PM) 25-500 MG TABS tablet Take 3 tablets by mouth at bedtime as needed (Sleep).    [provider]  Dulaglutide (TRULICITY) 1.10 RP/5.9YV SOPN Inject 0.75 mg into the skin once a week.    [provider]  gabapentin (NEURONTIN) 300 MG capsule Take 300 mg by mouth daily as needed (pain). 06/20/16   [provider]  glucose blood (ONETOUCH ULTRA) test strip USE TO TEST TWO TIMES A DAY 04/06/20   Anderson, Chelsey L, DO  ibuprofen (ADVIL) 800 MG tablet Take 1 tablet (800 mg total) by mouth every 8 (eight) hours as needed. 01/04/21   Kinsinger, Arta Bruce, MD  Insulin Pen Needle 32G X 6 MM MISC 1 packet by Does not apply route 2 (two) times daily. 03/29/20   Anderson, Chelsey L, DO  JARDIANCE 25 MG TABS tablet TAKE  ONE TABLET BY MOUTH DAILY. 01/14/21   Anderson, Chelsey L, DO  Lancets (ONETOUCH DELICA PLUS [id number]) [place] USE TO TEST TWO TIMES A DAY Patient not taking: Reported on 01/04/2021 12/21/19   Doristine Mango L, DO  LANTUS SOLOSTAR 100 UNIT/ML Solostar Pen INJECT 40 UNITS INTO THE SKIN EVERY MORNING AS DIRECTED 01/08/21   Ouida Sills, Chelsey L, DO  lurasidone (LATUDA) 40 MG TABS tablet Take 1 tablet (40 mg total) by mouth at bedtime. 01/04/21   Kinsinger, Arta Bruce, MD  omeprazole (PRILOSEC) 20 MG capsule TAKE 2 CAPSULES (40 MG TOTAL) BY MOUTH AT BEDTIME. 12/19/20   Anderson, Chelsey L, DO  oxyCODONE (OXY IR/ROXICODONE) 5 MG immediate release tablet Take 1-2 tablets (5-10  mg total) by mouth every 4 (four) hours as needed for moderate pain. 01/04/21   Kinsinger, Arta Bruce, MD  traZODone (DESYREL) 100 MG tablet Take 50-100 mg by mouth at bedtime. 12/05/19   [provider]  VYVANSE 20 MG capsule Take 20 mg by mouth daily with breakfast. Take with 50 mg for a total of 70 mg 06/28/18   [provider]  VYVANSE 50 MG capsule Take 50 mg by mouth See admin instructions. Take with 20 mg for a total of 70 mg in the morning 06/28/18   [provider]    Allergies    Metformin and related, Penicillins, and Nsaids  Review of Systems   Review of Systems  Constitutional: Negative for chills and fever.  Respiratory: Negative for cough and shortness of breath.   Cardiovascular: Negative for chest pain and palpitations.  Gastrointestinal: Negative for abdominal pain and vomiting.  Musculoskeletal: Positive for arthralgias and myalgias.  Skin: Positive for rash and wound.  Neurological: Negative for syncope and headaches.  All other systems reviewed and are negative.   Physical Exam Updated Vital Signs BP 123/73   Pulse 70   Temp 99.1 F (37.3 C) (Oral)   Resp 16   SpO2 96%   Physical Exam Constitutional:      General: He is not in acute distress. HENT:     Head: Normocephalic and  atraumatic.  Eyes:     Conjunctiva/sclera: Conjunctivae normal.     Pupils: Pupils are equal, round, and reactive to light.  Cardiovascular:     Rate and Rhythm: Normal rate and regular rhythm.  Pulmonary:     Effort: Pulmonary effort is normal. No respiratory distress.  Abdominal:     General: There is no distension.     Tenderness: There is no abdominal tenderness.  Skin:    General: Skin is warm and dry.  Neurological:     General: No focal deficit present.     Mental Status: He is alert and oriented to person, place, and time. Mental status is at baseline.  Psychiatric:        Mood and Affect: Mood normal.        Behavior: Behavior normal.     ED Results / Procedures / Treatments   Labs (all labs ordered are listed, but only abnormal results are displayed) Labs Reviewed  CBC WITH DIFFERENTIAL/PLATELET - Abnormal; Notable for the following components:      Result Value   Hemoglobin 12.5 (*)    Eosinophils Absolute 0.6 (*)    All other components within normal limits  COMPREHENSIVE METABOLIC PANEL - Abnormal; Notable for the following components:   Glucose, Bld 254 (*)    All other components within normal limits  URINALYSIS, ROUTINE W REFLEX MICROSCOPIC - Abnormal; Notable for the following components:   Specific Gravity, Urine >1.046 (*)    Glucose, UA 50 (*)    All other components within normal limits  LIPASE, BLOOD  LACTIC ACID, PLASMA    EKG None  Radiology CT ABDOMEN PELVIS W CONTRAST  Result Date: 01/29/2021 CLINICAL DATA:  Left lower abdominal pain for 4 days EXAM: CT ABDOMEN AND PELVIS WITH CONTRAST TECHNIQUE: Multidetector CT imaging of the abdomen and pelvis was performed using the standard protocol following bolus administration of intravenous contrast. CONTRAST:  135mL OMNIPAQUE IOHEXOL 300 MG/ML  SOLN COMPARISON:  09/26/2020 FINDINGS: Lower chest: No acute pleural or parenchymal lung disease. Hepatobiliary: No focal liver abnormality is seen. No  gallstones, gallbladder wall  thickening, or biliary dilatation. Pancreas: Unremarkable. No pancreatic ductal dilatation or surrounding inflammatory changes. Spleen: Normal in size without focal abnormality. Adrenals/Urinary Tract: Adrenal glands are unremarkable. Kidneys are normal, without renal calculi, focal lesion, or hydronephrosis. Bladder is unremarkable. Stomach/Bowel: No bowel obstruction or ileus. Postsurgical changes from prior ascending and sigmoid colon resections with reanastomosis. No bowel wall thickening or inflammatory change. Vascular/Lymphatic: Aortic atherosclerosis. No pathologic adenopathy. Subcentimeter bilateral external iliac and inguinal lymph nodes are nonspecific but unchanged. Reproductive: Prostate is unremarkable. Other: Postsurgical changes are seen from interval ventral hernia repairs. There is a large anterior abdominal fluid collection measuring 21.8 x 9.1 cm in transverse direction and extending approximately 19.5 cm in craniocaudal length, most consistent with postoperative seroma. There is no evidence of peripheral enhancement to suggest superinfection. There is some residual fluid at the site of prior right mid abdominal and umbilical hernias. A small focus of gas within the superior most hernia repair site is nonspecific and could still be related to recent surgical intervention. No evidence of bowel herniation. There is no free intraperitoneal fluid or free gas. Musculoskeletal: No acute or destructive bony lesions. Reconstructed images demonstrate no additional findings. IMPRESSION: 1. Postoperative changes from multiple ventral hernia repairs. There is a large simple appearing fluid collection just deep to the anterior abdominal wall consistent with postoperative seroma, measuring 21.8 x 9.1 x 19.5 cm. 2.  Aortic Atherosclerosis (ICD10-I70.0). Electronically Signed   By: Randa Ngo M.D.   On: 01/29/2021 20:06    Procedures Procedures   Medications Ordered in  ED Medications  iohexol (OMNIPAQUE) 300 MG/ML solution 100 mL (100 mLs Intravenous Contrast Given 01/29/21 1942)  HYDROmorphone (DILAUDID) injection 1 mg (1 mg Intravenous Given 01/29/21 2116)  ciprofloxacin (CIPRO) tablet 500 mg (500 mg Oral Given 01/29/21 2116)  metroNIDAZOLE (FLAGYL) tablet 500 mg (500 mg Oral Given 01/29/21 2116)    ED Course  I have reviewed the triage vital signs and the nursing notes.  Pertinent labs & imaging results that were available during my care of the patient were reviewed by me and considered in my medical decision making (see chart for details).  This patient presents to the Emergency Department with complaint of abdominal pain. This involves an extensive number of treatment options, and is a complaint that carries with it a high risk of complications and morbidity.  The differential diagnosis includes, but is not limited to, gastritis vs peptic ulcer vs constipation vs colitis vs UTI vs post surgical infection vs other  I ordered, reviewed, and interpreted labs, including BMP and CBC.  There were no immediate, life-threatening emergencies found in this labwork.  Patient's UA showed no signs of infection.  Lactate 1.0 and normal. I ordered medication for abdominal pain and/or nausea I ordered imaging studies which included CT abdomen I independently visualized and interpreted imaging which showed no significant inflammatory changes, small amount of likely post-operative fluid near surgical site, and the monitor tracing which showed NSR  After the interventions stated above, I reevaluated the patient and found that they remained clinically stable with improvement after IV pain medications.  Based on the patient's clinical exam, vital signs, risk factors, and ED testing, I felt that the patient's overall risk of life-threatening emergency such as bowel perforation, surgical emergency, or sepsis was quite low.  I suspect this clinical presentation is most consistent  with colitis vs post-operative nerve pain, but explained to the patient that this evaluation was not a definitive diagnostic workup.  I discussed return precautions  with the patient. I felt the patient was clinically stable for discharge.   Clinical Course as of 01/30/21 0926  Tue Jan 29, 2021  2040 IMPRESSION: 1. Postoperative changes from multiple ventral hernia repairs. There is a large simple appearing fluid collection just deep to the anterior abdominal wall consistent with postoperative seroma, measuring 21.8 x 9.1 x 19.5 cm. 2. Aortic Atherosclerosis (ICD10-I70.0). [MT]  2040 Normal white blood cell count, normal lactate.  Have a very low suspicion for bowel perforation with symptoms ongoing for several days.  I think be reasonable to start him on Cipro and Flagyl to cover for colitis, but otherwise discharge him. [MT]  2103 Patient reports to me that he did call surgeon's office today and "they told me that they caught a nerve and this was causing the pain".  I said this was a possibility.  I advised that he still call them to follow-up for this appointment.  I still think it is reasonable to go ahead and initiate a week's course of antibiotics.  If he has not improved, he needs to follow-up with the surgeons. [MT]    Clinical Course User Index [MT] Shequila Neglia, Carola Rhine, MD    Final Clinical Impression(s) / ED Diagnoses Final diagnoses:  Abdominal pain, unspecified abdominal location    Rx / DC Orders ED Discharge Orders         Ordered    ciprofloxacin (CIPRO) 500 MG tablet  Every 12 hours        01/29/21 2105    metroNIDAZOLE (FLAGYL) 500 MG tablet  3 times daily        01/29/21 2105    oxyCODONE (ROXICODONE) 5 MG immediate release tablet  Every 6 hours PRN        01/29/21 2106           Wyvonnia Dusky, MD 01/30/21 [phone]

## 2021-01-29 NOTE — Discharge Instructions (Addendum)
We started you on antibiotics for possible inflammation of the bowel.  It is not clear that this is what is causing your pain.  It may be nerve pain as your surgeon said.  It is really important that you call and schedule a follow-up appointment with a general surgeon.  If you are not feeling better at the end of your antibiotics, you need to discuss this with the surgeons office.  Try to take there antibiotics a little bit of food.  Do not drink any alcohol for taking these drugs, as it will make you very sick.

## 2021-02-01 ENCOUNTER — Other Ambulatory Visit: Payer: Self-pay | Admitting: Student in an Organized Health Care Education/Training Program

## 2021-02-04 ENCOUNTER — Ambulatory Visit (HOSPITAL_COMMUNITY): Payer: Medicare Other | Admitting: Physical Therapy

## 2021-02-06 ENCOUNTER — Ambulatory Visit (INDEPENDENT_AMBULATORY_CARE_PROVIDER_SITE_OTHER): Payer: Medicare Other | Admitting: Student in an Organized Health Care Education/Training Program

## 2021-02-06 ENCOUNTER — Encounter: Payer: Self-pay | Admitting: Student in an Organized Health Care Education/Training Program

## 2021-02-06 ENCOUNTER — Other Ambulatory Visit: Payer: Self-pay

## 2021-02-06 DIAGNOSIS — G4700 Insomnia, unspecified: Secondary | ICD-10-CM

## 2021-02-06 DIAGNOSIS — Z794 Long term (current) use of insulin: Secondary | ICD-10-CM | POA: Diagnosis not present

## 2021-02-06 DIAGNOSIS — R1084 Generalized abdominal pain: Secondary | ICD-10-CM | POA: Diagnosis not present

## 2021-02-06 DIAGNOSIS — E1165 Type 2 diabetes mellitus with hyperglycemia: Secondary | ICD-10-CM

## 2021-02-06 MED ORDER — TRULICITY 0.75 MG/0.5ML ~~LOC~~ SOAJ: 0.7500 mg | SUBCUTANEOUS | 3 refills | Status: DC

## 2021-02-06 MED ORDER — MELATONIN 1 MG SL SUBL: 1.0000 mg | SUBLINGUAL_TABLET | Freq: Every evening | SUBLINGUAL | 1 refills | Status: DC

## 2021-02-06 MED ORDER — PROMETHAZINE HCL 50 MG PO TABS: 50.0000 mg | ORAL_TABLET | Freq: Four times a day (QID) | ORAL | 0 refills | Status: DC | PRN

## 2021-02-06 NOTE — Assessment & Plan Note (Signed)
Endorses insomnia which is worsened by pain. Patient already on vyvanse, trazodone and nightly benadryl in tylenol - prescribed melatonin and recommend sleep hygiene changes

## 2021-02-06 NOTE — Assessment & Plan Note (Signed)
Poorly controlled and poorly adherent with treatment. Likely contributing to his abdominal pain and worsens healing from recent surgery. Refilled trulicity today Recommend lipid panel and A1c at f/u

## 2021-02-06 NOTE — Patient Instructions (Signed)
It was a pleasure to see you today!  To summarize our discussion for this visit:  I'm sorry to hear that you are in so much pain. I will send in a medication to help with your nausea and hopefully with help calm down/regulate your bowels.   I would highly recommend you call your surgeon and try to be seen sooner. If not able to tolerate the pain or if it gets intolerable, please go to the emergency department again.   Some additional health maintenance measures we should update are: Health Maintenance Due  Topic Date Due  . OPHTHALMOLOGY EXAM  Never done  . FOOT EXAM  09/11/2019  . URINE MICROALBUMIN  09/11/2019  . COVID-19 Vaccine (3 - Booster for Moderna series) 11/30/2020  .    Call the clinic at [phone] if your symptoms worsen or you have any concerns.   Thank you for allowing me to take part in your care,  Dr. Doristine Mango

## 2021-02-06 NOTE — Progress Notes (Signed)
SUBJECTIVE:   CHIEF COMPLAINT / HPI: abdominal pain  Abdominal pain-  s/p laparoscopic hernia repair with mesh 3/18. Pain in bilateral ribs after surgery worsened with deep breaths. Went to ED for pain on 4/12 and started on cipro and flagyl in ED 4/12. CT showed extensive post op fluid including anterior abdominal wall seroma 21.8cm at largest diameter.  Is now having continuous generalized pain on left side described as "burning". Interfering with ability to sleep. Makes him cry at times. Movement makes it worse. Has decreased appetite and early satiety. Some nausea but no vomiting. No weight loss (actually 7lb weight gain). Diarrhea since surgery described as one watery BM per day. Last BM was today. 4/28 scheduled surgery follow up. Some walking since surgery but not very active. Also having lower back and right elbow pain since surgery. Had been taking ibuprofen and oxycodone but ran out. No fever or rashes.  Denies problems/pain with urination. Denies hematuria. Urinalysis 4/12 negative hgb.  Other med updates from review: vyvanse '70mg'$  daily No longer taking insulin trulicity out for 2 weeks Takes gabapentin '600mg'$  daily  Insomnia-  Takes tylenol PM Trazodone   OBJECTIVE:   BP (!) 144/82   Pulse 94   Wt 242 lb 6.4 oz (110 kg)   SpO2 98%   BMI 37.97 kg/m   Physical Exam Vitals and nursing note reviewed. Exam conducted with a chaperone present.  Constitutional:      General: He is not in acute distress.    Appearance: He is obese. He is not ill-appearing, toxic-appearing or diaphoretic.  Abdominal:     General: Abdomen is protuberant. A surgical scar is present. Bowel sounds are normal. There is distension. There are no signs of injury.     Palpations: There is no shifting dullness or fluid wave.     Tenderness: There is abdominal tenderness in the right upper quadrant and right lower quadrant.     Hernia: No hernia is present.       Comments: Abundant surgical  scars throughout abdomen including recent laparoscopic incisions which are healing well.  Tenderness without guarding or rebound to diffuse mild pressure on entire left abdomen. No bruising or skin changes. Negative for fluid wave.  Bowel sounds are normal. Also examined by preceptor Dr. Erin Hearing.  Skin:    General: Skin is warm and dry.  Neurological:     Mental Status: He is alert.    ASSESSMENT/PLAN:   Abdominal pain Chronic abdominal pain with complex abdominal surgical history. Presents with generalized left sided abdomen pain without acute symptoms of: constipation, vomiting or weight loss.  Recent abdominal CT showed significant abdominal wall seroma which could be contributing as well as just extensive intra-abdominal adhesive lesions. Low suspicion for obstruction. Diarrhea likely related to antibiotic use from ED. Reassured with normal vitals and weight gain although concern that seroma has grown. Recommended attempting to get closer follow up with his surgeon or their group for evaluation.  Prescribed phenergan to help with nausea/ bowel regulation. Gave ED precautions  Insomnia Endorses insomnia which is worsened by pain. Patient already on vyvanse, trazodone and nightly benadryl in tylenol - prescribed melatonin and recommend sleep hygiene changes  Type 2 diabetes mellitus with hyperglycemia ([place]) Poorly controlled and poorly adherent with treatment. Likely contributing to his abdominal pain and worsens healing from recent surgery. Refilled trulicity today Recommend lipid panel and A1c at f/u     [place]

## 2021-02-06 NOTE — Assessment & Plan Note (Signed)
Chronic abdominal pain with complex abdominal surgical history. Presents with generalized left sided abdomen pain without acute symptoms of: constipation, vomiting or weight loss.  Recent abdominal CT showed significant abdominal wall seroma which could be contributing as well as just extensive intra-abdominal adhesive lesions. Low suspicion for obstruction. Diarrhea likely related to antibiotic use from ED. Reassured with normal vitals and weight gain although concern that seroma has grown. Recommended attempting to get closer follow up with his surgeon or their group for evaluation.  Prescribed phenergan to help with nausea/ bowel regulation. Gave ED precautions

## 2021-02-07 ENCOUNTER — Other Ambulatory Visit (HOSPITAL_COMMUNITY): Payer: Self-pay | Admitting: Student

## 2021-02-07 DIAGNOSIS — K432 Incisional hernia without obstruction or gangrene: Secondary | ICD-10-CM

## 2021-02-07 DIAGNOSIS — R188 Other ascites: Secondary | ICD-10-CM

## 2021-02-08 ENCOUNTER — Telehealth (HOSPITAL_COMMUNITY): Payer: Self-pay

## 2021-02-08 NOTE — Telephone Encounter (Signed)
Called to schedule drain placement, no answer, left vm. AW

## 2021-02-08 NOTE — Telephone Encounter (Signed)
-----   Message from [name] sent at 02/08/2021  8:51 AM EDT ----- Regarding: FW: [place] Aspriation / Drain Placement  ----- Message ----- From: Markus Daft, MD Sent: 02/07/2021   6:27 PM EDT To: [name] Subject: RE: [place] Aspriation / Drain Placement            Ok for US/IR guided drain placement.  Would schedule for it to be done in IR suite.  [place] ----- Message ----- From: Garth Bigness D Sent: 02/07/2021   5:25 PM EDT To: Ir Procedure Requests Subject: [place] Aspriation / Drain Placement                Procedure:  US Aspiration of Abdomen / Drain Placement if needed  Reason:  Abdominal fluid collection, Incisional hernia without obstruction or gangrene, s/p hernia repair w / mesh 01/04/21. CT 01/29/21 with large fluid collection, place drain if needed  History:  CT in computer  Provider:  Carlena Hurl  Provider Contact:  [phone]

## 2021-02-13 ENCOUNTER — Other Ambulatory Visit: Payer: Self-pay | Admitting: Radiology

## 2021-02-13 ENCOUNTER — Other Ambulatory Visit: Payer: Self-pay | Admitting: Student in an Organized Health Care Education/Training Program

## 2021-02-13 DIAGNOSIS — E119 Type 2 diabetes mellitus without complications: Secondary | ICD-10-CM

## 2021-02-14 ENCOUNTER — Ambulatory Visit (HOSPITAL_COMMUNITY)
Admission: RE | Admit: 2021-02-14 | Discharge: 2021-02-14 | Disposition: A | Discharge status: Home / Self Care | Payer: Medicare Other | Source: Ambulatory Visit | Attending: Student | Admitting: Student

## 2021-02-14 ENCOUNTER — Encounter: Payer: Self-pay | Admitting: Radiology

## 2021-02-14 ENCOUNTER — Other Ambulatory Visit: Payer: Self-pay | Admitting: Radiology

## 2021-02-14 ENCOUNTER — Other Ambulatory Visit: Payer: Self-pay

## 2021-02-14 ENCOUNTER — Other Ambulatory Visit (HOSPITAL_COMMUNITY): Payer: Self-pay | Admitting: Student

## 2021-02-14 DIAGNOSIS — Z7984 Long term (current) use of oral hypoglycemic drugs: Secondary | ICD-10-CM | POA: Diagnosis not present

## 2021-02-14 DIAGNOSIS — F17210 Nicotine dependence, cigarettes, uncomplicated: Secondary | ICD-10-CM | POA: Diagnosis not present

## 2021-02-14 DIAGNOSIS — Z886 Allergy status to analgesic agent status: Secondary | ICD-10-CM | POA: Diagnosis not present

## 2021-02-14 DIAGNOSIS — R188 Other ascites: Secondary | ICD-10-CM | POA: Insufficient documentation

## 2021-02-14 DIAGNOSIS — Z888 Allergy status to other drugs, medicaments and biological substances status: Secondary | ICD-10-CM | POA: Diagnosis not present

## 2021-02-14 DIAGNOSIS — Z880 Allergy status to penicillin: Secondary | ICD-10-CM | POA: Insufficient documentation

## 2021-02-14 DIAGNOSIS — Z79899 Other long term (current) drug therapy: Secondary | ICD-10-CM | POA: Diagnosis not present

## 2021-02-14 DIAGNOSIS — K432 Incisional hernia without obstruction or gangrene: Secondary | ICD-10-CM | POA: Insufficient documentation

## 2021-02-14 DIAGNOSIS — T8189XA Other complications of procedures, not elsewhere classified, initial encounter: Secondary | ICD-10-CM | POA: Diagnosis not present

## 2021-02-14 DIAGNOSIS — S301XXA Contusion of abdominal wall, initial encounter: Secondary | ICD-10-CM

## 2021-02-14 HISTORY — PX: IR GUIDED DRAIN W CATHETER PLACEMENT: IMG719

## 2021-02-14 LAB — GLUCOSE, CAPILLARY: Glucose-Capillary: 206 mg/dL — ABNORMAL HIGH (ref 70–99)

## 2021-02-14 LAB — CBC
HCT: 41.4 % (ref 39.0–52.0)
Hemoglobin: 13.1 g/dL (ref 13.0–17.0)
MCH: 25.6 pg — ABNORMAL LOW (ref 26.0–34.0)
MCHC: 31.6 g/dL (ref 30.0–36.0)
MCV: 81 fL (ref 80.0–100.0)
Platelets: 224 10*3/uL (ref 150–400)
RBC: 5.11 MIL/uL (ref 4.22–5.81)
RDW: 14.1 % (ref 11.5–15.5)
WBC: 7.8 10*3/uL (ref 4.0–10.5)
nRBC: 0 % (ref 0.0–0.2)

## 2021-02-14 LAB — PROTIME-INR
INR: 1 (ref 0.8–1.2)
Prothrombin Time: 13.3 seconds (ref 11.4–15.2)

## 2021-02-14 MED ORDER — LIDOCAINE HCL 1 % IJ SOLN
INTRAMUSCULAR | Status: AC
  Filled 2021-02-14: qty 20

## 2021-02-14 MED ORDER — LIDOCAINE HCL 1 % IJ SOLN
INTRAMUSCULAR | Status: AC | PRN
  Administered 2021-02-14: 10 mL

## 2021-02-14 MED ORDER — FENTANYL CITRATE (PF) 100 MCG/2ML IJ SOLN
INTRAMUSCULAR | Status: AC
  Filled 2021-02-14: qty 4

## 2021-02-14 MED ORDER — IOHEXOL 300 MG/ML  SOLN: 50.0000 mL | Freq: Once | INTRAMUSCULAR | Status: DC | PRN

## 2021-02-14 MED ORDER — MIDAZOLAM HCL 2 MG/2ML IJ SOLN
INTRAMUSCULAR | Status: AC | PRN
  Administered 2021-02-14 (×3): 1 mg via INTRAVENOUS
  Administered 2021-02-14: 0.5 mg via INTRAVENOUS
  Administered 2021-02-14: 1 mg via INTRAVENOUS

## 2021-02-14 MED ORDER — MIDAZOLAM HCL 2 MG/2ML IJ SOLN
INTRAMUSCULAR | Status: AC
  Filled 2021-02-14: qty 2

## 2021-02-14 MED ORDER — FENTANYL CITRATE (PF) 100 MCG/2ML IJ SOLN
INTRAMUSCULAR | Status: AC | PRN
  Administered 2021-02-14 (×4): 50 ug via INTRAVENOUS

## 2021-02-14 MED ORDER — SODIUM CHLORIDE 0.9% FLUSH: 5.0000 mL | Freq: Three times a day (TID) | INTRAVENOUS | Status: DC

## 2021-02-14 MED ORDER — MIDAZOLAM HCL 2 MG/2ML IJ SOLN
INTRAMUSCULAR | Status: AC
  Filled 2021-02-14: qty 4

## 2021-02-14 MED ORDER — SODIUM CHLORIDE 0.9 % IV SOLN: INTRAVENOUS | Status: DC

## 2021-02-14 NOTE — Sedation Documentation (Signed)
Discharge instructions reviewed with pt and wife with verbal understanding. I personally showed the pt and wife how to disconnect and flushed the drain and how to empty the JP bulb and instructed them to be sure to record drainage amounts. I also showed the pt and wife how to dress the drain and the importance of keeping it clean, dry, and intatct. All supplies were provided for care and management of the drain. A prescription was provided for additional NS flushes should the be need. PIV in LEFT hand was D/C'ed with tip intact. Gauze bandage was applied. Site CDI. Pt was taken to main entrance in [facility] with wife and discharge to home to self.

## 2021-02-14 NOTE — Sedation Documentation (Signed)
Pt taken to [place].

## 2021-02-14 NOTE — Sedation Documentation (Signed)
1 Liter dark brown/red colored fluid aspirated from abdominal fluid collection.

## 2021-02-14 NOTE — H&P (Signed)
Chief Complaint: Patient was seen in consultation today for abdominal seroma drain at the request of Gosai,Puja  Referring Physician(s): Hedda Slade  Supervising Physician: Malachy Moan  Patient Status: [facility] - Out-pt  History of Present Illness: [name] is a 40 y.o. male who underwent ventral hernia surgery several weeks back. He's had some ongoing pain and a CT was performed showing large fluid collection concerning for seroma vs abscess. He is now scheduled with IR for drainage. PMHx, meds, labs, imaging, allergies reviewed. Feels well, no recent fevers, chills, illness. Has been NPO today as directed.    Past Medical History:  Diagnosis Date  . Allergy   . Anxiety   . Becker's muscular dystrophy (HCC)   . Bipolar disorder (HCC)    On disability  . Depression   . Diabetes mellitus without complication (HCC)    per patient : under control with diet, does not monitor cbg at home   . GERD (gastroesophageal reflux disease)   . Headache(784.0)    daily headaches  . Hyperlipidemia   . Neuromuscular disorder (HCC)    Beckers muscular dystrophy  . Obesity   . PTSD (post-traumatic stress disorder)   . Sleep apnea    Does not tolerate CPAP    Past Surgical History:  Procedure Laterality Date  . APPLICATION OF WOUND VAC N/A 11/23/2018   Procedure: Application Of Wound Vac;  Surgeon: Kinsinger, De Blanch, MD;  Location: [facility] OR;  Service: General;  Laterality: N/A;  . COLECTOMY N/A 11/23/2018   Procedure: TOTAL COLECTOMY;  Surgeon: Sheliah Hatch De Blanch, MD;  Location: [facility] OR;  Service: General;  Laterality: N/A;  . ILEOSTOMY N/A 11/23/2018   Procedure: ILEOSTOMY;  Surgeon: Kinsinger, De Blanch, MD;  Location: [facility] OR;  Service: General;  Laterality: N/A;  . ILEOSTOMY CLOSURE N/A 05/19/2019   Procedure: ILEOSTOMY REVERSAL WITH ILEOCOLIC AND COLORECTAL ANASTOMOSIS;  Surgeon: Kinsinger, De Blanch, MD;  Location: [facility] ORS;  Service: General;  Laterality: N/A;  . INCISIONAL  HERNIA REPAIR N/A 01/04/2021   Procedure: LAPAROSCOPIC INCISIONAL HERNIA REPAIR WITH MESH; LYSIS OF ADHESIONS;  Surgeon: Kinsinger, De Blanch, MD;  Location: [facility] ORS;  Service: General;  Laterality: N/A;  . NM MYOCAR PERF WALL MOTION  01/22/2011   protocol: Persantine, moderate reversible inferior defect post stress EF 48%, high risk scan  . TONSILLECTOMY    . TRANSTHORACIC ECHOCARDIOGRAM  07/05/2004   EF=>55% normal study     Allergies: Metformin and related, Penicillins, and Nsaids  Medications: Prior to Admission medications   Medication Sig Start Date End Date Taking? Authorizing Provider  busPIRone (BUSPAR) 10 MG tablet Take 10 mg by mouth 3 (three) times daily. 10/25/20  Yes [provider]  Dulaglutide (TRULICITY) 0.75 MG/0.5ML SOPN Inject 0.75 mg into the skin once a week. 02/06/21  Yes Anderson, Chelsey L, DO  JARDIANCE 25 MG TABS tablet TAKE ONE TABLET BY MOUTH DAILY. 01/14/21  Yes Anderson, Chelsey L, DO  lurasidone (LATUDA) 40 MG TABS tablet Take 1 tablet (40 mg total) by mouth at bedtime. 01/04/21  Yes Kinsinger, De Blanch, MD  Melatonin 1 MG SUBL Place 1 mg under the tongue at bedtime. 02/06/21  Yes Anderson, Chelsey L, DO  omeprazole (PRILOSEC) 20 MG capsule TAKE 2 CAPSULES (40 MG TOTAL) BY MOUTH AT BEDTIME. 12/19/20  Yes Anderson, Chelsey L, DO  oxyCODONE (ROXICODONE) 5 MG immediate release tablet Take 1 tablet (5 mg total) by mouth every 6 (six) hours as needed for up to 12 doses for severe  pain. 01/29/21  Yes Trifan, Carola Rhine, MD  traZODone (DESYREL) 100 MG tablet Take 50-100 mg by mouth at bedtime. 12/05/19  Yes [provider]  VYVANSE 20 MG capsule Take 20 mg by mouth daily with breakfast. Take with 50 mg for a total of 70 mg 06/28/18  Yes [provider]  Blood Glucose Monitoring Suppl (ONE TOUCH ULTRA 2) w/Device KIT Measure blood sugar daily and with meals for insulin 01/08/21   Anderson, Chelsey L, DO  diphenhydramine-acetaminophen (TYLENOL PM) 25-500  MG TABS tablet Take 3 tablets by mouth at bedtime as needed (Sleep).    [provider]  gabapentin (NEURONTIN) 300 MG capsule Take 300 mg by mouth daily as needed (pain). 06/20/16   [provider]  glucose blood (ONETOUCH ULTRA) test strip USE TO TEST TWO TIMES A DAY 04/06/20   Anderson, Chelsey L, DO  ibuprofen (ADVIL) 800 MG tablet Take 1 tablet (800 mg total) by mouth every 8 (eight) hours as needed. Patient not taking: Reported on 02/06/2021 01/04/21   Kinsinger, Arta Bruce, MD  Insulin Pen Needle 32G X 6 MM MISC 1 packet by Does not apply route 2 (two) times daily. Patient not taking: Reported on 02/06/2021 03/29/20   Doristine Mango L, DO  Lancets (ONETOUCH DELICA PLUS [id number]) [place] USE TO TEST TWO TIMES A DAY Patient not taking: No sig reported 12/21/19   Doristine Mango L, DO  LANTUS SOLOSTAR 100 UNIT/ML Solostar Pen INJECT 40 UNITS INTO THE SKIN EVERY MORNING AS DIRECTED Patient not taking: Reported on 02/06/2021 01/08/21   Doristine Mango L, DO  oxyCODONE (OXY IR/ROXICODONE) 5 MG immediate release tablet Take 1-2 tablets (5-10 mg total) by mouth every 4 (four) hours as needed for moderate pain. Patient not taking: Reported on 02/06/2021 01/04/21   Kinsinger, Arta Bruce, MD  promethazine (PHENERGAN) 50 MG tablet Take 1 tablet (50 mg total) by mouth every 6 (six) hours as needed for nausea or vomiting. 02/06/21   Ouida Sills, Chelsey L, DO  VYVANSE 50 MG capsule Take 50 mg by mouth See admin instructions. Take with 20 mg for a total of 70 mg in the morning Patient not taking: Reported on 02/06/2021 06/28/18   [provider]     Family History  Problem Relation Age of Onset  . COPD Mother   . Depression Mother   . Diabetes Mother   . Hyperlipidemia Mother   . Asthma Father   . Arthritis Father   . Diabetes Father   . Heart disease Father   . Hyperlipidemia Father   . Hypertension Father   . Hypertension Brother     Social History   Socioeconomic History  .  Marital status: Married    Spouse name: Not on file  . Number of children: 2  . Years of education: 72  . Highest education level: Not on file  Occupational History    Employer: UNEMPLOYED  Tobacco Use  . Smoking status: Current Every Day Smoker    Packs/day: 1.50    Years: 21.00    Pack years: 31.50    Types: Cigarettes  . Smokeless tobacco: Never Used  Vaping Use  . Vaping Use: Former  . Devices: Increased nicotine cravings  Substance and Sexual Activity  . Alcohol use: No    Alcohol/week: 0.0 standard drinks    Comment: quit 2013  . Drug use: Not Currently    Types: Marijuana    Comment: 7-27  . Sexual activity: Yes    Birth  control/protection: None  Other Topics Concern  . Not on file  Social History Narrative   Patient lives at home with parents, brother and brothers wife.    Patient is single.    Patient has 2 children.    Patient has a high school education.    Patient is unemployed.    Patient is right handed.    Social Determinants of Health   Financial Resource Strain: Not on file  Food Insecurity: Not on file  Transportation Needs: Not on file  Physical Activity: Not on file  Stress: Not on file  Social Connections: Not on file    Review of Systems: A 12 point ROS discussed and pertinent positives are indicated in the HPI above.  All other systems are negative.  Review of Systems  Vital Signs: BP (!) 142/93   Pulse 95   Temp 98.6 F (37 C) (Oral)   Ht $R'5\' 7"'lF$  (1.702 m)   Wt 110.2 kg   SpO2 97%   BMI 38.06 kg/m   Physical Exam Constitutional:      Appearance: Normal appearance. He is not ill-appearing.  HENT:     Mouth/Throat:     Mouth: Mucous membranes are moist.     Pharynx: Oropharynx is clear.  Cardiovascular:     Rate and Rhythm: Normal rate and regular rhythm.     Heart sounds: Normal heart sounds.  Pulmonary:     Effort: Pulmonary effort is normal. No respiratory distress.     Breath sounds: Normal breath sounds.  Abdominal:      General: There is no distension.     Palpations: Abdomen is soft.     Tenderness: There is no abdominal tenderness.  Skin:    General: Skin is warm and dry.  Neurological:     General: No focal deficit present.     Mental Status: He is alert and oriented to person, place, and time.  Psychiatric:        Mood and Affect: Mood normal.        Thought Content: Thought content normal.        Judgment: Judgment normal.     Imaging: CT ABDOMEN PELVIS W CONTRAST  Result Date: 01/29/2021 CLINICAL DATA:  Left lower abdominal pain for 4 days EXAM: CT ABDOMEN AND PELVIS WITH CONTRAST TECHNIQUE: Multidetector CT imaging of the abdomen and pelvis was performed using the standard protocol following bolus administration of intravenous contrast. CONTRAST:  171mL OMNIPAQUE IOHEXOL 300 MG/ML  SOLN COMPARISON:  09/26/2020 FINDINGS: Lower chest: No acute pleural or parenchymal lung disease. Hepatobiliary: No focal liver abnormality is seen. No gallstones, gallbladder wall thickening, or biliary dilatation. Pancreas: Unremarkable. No pancreatic ductal dilatation or surrounding inflammatory changes. Spleen: Normal in size without focal abnormality. Adrenals/Urinary Tract: Adrenal glands are unremarkable. Kidneys are normal, without renal calculi, focal lesion, or hydronephrosis. Bladder is unremarkable. Stomach/Bowel: No bowel obstruction or ileus. Postsurgical changes from prior ascending and sigmoid colon resections with reanastomosis. No bowel wall thickening or inflammatory change. Vascular/Lymphatic: Aortic atherosclerosis. No pathologic adenopathy. Subcentimeter bilateral external iliac and inguinal lymph nodes are nonspecific but unchanged. Reproductive: Prostate is unremarkable. Other: Postsurgical changes are seen from interval ventral hernia repairs. There is a large anterior abdominal fluid collection measuring 21.8 x 9.1 cm in transverse direction and extending approximately 19.5 cm in craniocaudal length,  most consistent with postoperative seroma. There is no evidence of peripheral enhancement to suggest superinfection. There is some residual fluid at the site of prior right mid abdominal  and umbilical hernias. A small focus of gas within the superior most hernia repair site is nonspecific and could still be related to recent surgical intervention. No evidence of bowel herniation. There is no free intraperitoneal fluid or free gas. Musculoskeletal: No acute or destructive bony lesions. Reconstructed images demonstrate no additional findings. IMPRESSION: 1. Postoperative changes from multiple ventral hernia repairs. There is a large simple appearing fluid collection just deep to the anterior abdominal wall consistent with postoperative seroma, measuring 21.8 x 9.1 x 19.5 cm. 2.  Aortic Atherosclerosis (ICD10-I70.0). Electronically Signed   By: Randa Ngo M.D.   On: 01/29/2021 20:06    Labs:  CBC: Recent Labs    12/25/20 1126 01/04/21 1124 01/05/21 0742 01/29/21 1844  WBC 8.4 11.5* 9.7 9.9  HGB 15.2 14.7 12.4* 12.5*  HCT 45.9 45.2 37.4* 39.1  PLT 215 184 203 310    COAGS: No results for input(s): INR, APTT in the last 8760 hours.  BMP: Recent Labs    03/29/20 1631 06/30/20 2348 07/29/20 1528 11/21/20 1358 12/25/20 1126 01/04/21 1124 01/05/21 0742 01/29/21 1844  NA 138 133*   < > 137 135  --  134* 135  K 4.0 2.6*   < > 4.2 4.1  --  3.7 3.5  CL 98 92*   < > 99 99  --  98 98  CO2 23 28   < > 25 27  --  25 27  GLUCOSE 283* 245*   < > 290* 257*  --  259* 254*  BUN 5* 7   < > 8 8  --  18 11  CALCIUM 9.5 9.3   < > 9.4 8.8*  --  8.4* 9.1  CREATININE 0.94 0.87   < > 0.87 0.80 1.00 0.82 0.75  GFRNONAA 102 >60   < > 109 >60 >60 >60 >60  GFRAA 118 >60  --  126  --   --   --   --    < > = values in this interval not displayed.    LIVER FUNCTION TESTS: Recent Labs    03/29/20 1631 06/30/20 2348 09/26/20 1432 01/29/21 1844  BILITOT <0.2 0.5 0.5 0.5  AST 40 $Remo'29 18 15  'xOqST$ ALT 40 43  25 16  ALKPHOS 111 87 87 87  PROT 8.0 8.1 7.5 7.8  ALBUMIN 4.5 4.3 4.0 3.9    TUMOR MARKERS: No results for input(s): AFPTM, CEA, CA199, CHROMGRNA in the last 8760 hours.  Assessment and Plan: Post surgical abdominal seroma vs abscess Plan for image guided drainage/aspiration Labs reviewed. Risks and benefits discussed with the patient including bleeding, infection, damage to adjacent structures, bowel perforation/fistula connection, and sepsis.  All of the patient's questions were answered, patient is agreeable to proceed. Consent signed and in chart.    Thank you for this interesting consult.  I greatly enjoyed [place] and look forward to participating in their care.  A copy of this report was sent to the requesting provider on this date.  Electronically Signed: Ascencion Dike, PA-C 02/14/2021, 9:06 AM   I spent a total of 20 minutes in face to face in clinical consultation, greater than 50% of which was counseling/coordinating care for abdominal seroma drain

## 2021-02-14 NOTE — Discharge Instructions (Signed)
Change dressing as needed Flush drain once daily with 5-74mL saline (you received a prescription for this) Record output daily-bring log to your follow up appt Schedulers will call you for follow up appt at [facility] Surgical drains are used to remove extra fluid that normally builds up in a surgical wound after surgery. A surgical drain helps to heal a surgical wound. Different kinds of surgical drains include:  Active drains. These drains use suction to pull drainage away from the surgical wound. Drainage flows through a tube to a container outside of the body. With these drains, you need to keep the bulb or the drainage container flat (compressed) at all times, except while you empty it. Flattening the bulb or container creates suction.  Passive drains. These drains allow fluid to drain naturally, by gravity. Drainage flows through a tube to a bandage (dressing) or a container outside of the body. Passive drains do not need to be emptied. A drain is placed during surgery. Right after surgery, drainage is usually bright red and a little thicker than water. The drainage may gradually turn yellow or pink and become thinner. It is likely that your health care provider will remove the drain when the drainage stops or when the amount decreases to 1-2 Tbsp (15-30 mL) during a 24-hour period. Supplies needed:  Tape.  Germ-free cleaning solution (sterile saline).  Cotton swabs.  Split gauze drain sponge: 4 x 4 inches (10 x 10 cm).  Gauze square: 4 x 4 inches (10 x 10 cm). How to care for your surgical drain Care for your drain as told by your health care provider. This is important to help prevent infection. If your drain is placed at your back, or any other hard-to-reach area, ask another person to assist you in performing the following tasks: General care  Keep the skin around the drain dry and covered with a dressing at all times.  Check your drain area  every day for signs of infection. Check for: ? Redness, swelling, or pain. ? Pus or a bad smell. ? Cloudy drainage. ? Tenderness or pressure at the drain exit site. Changing the dressing Follow instructions from your health care provider about how to change your dressing. Change your dressing at least once a day. Change it more often if needed to keep the dressing dry. Make sure you: 1. Gather your supplies. 2. Wash your hands with soap and water before you change your dressing. If soap and water are not available, use hand sanitizer. 3. Remove the old dressing. Avoid using scissors to do that. 4. Wash your hands with soap and water again after removing the old dressing. 5. Use sterile saline to clean your skin around the drain. You may need to use a cotton swab to clean the skin. 6. Place the tube through the slit in a drain sponge. Place the drain sponge so that it covers your wound. 7. Place the gauze square or another drain sponge on top of the drain sponge that is on the wound. Make sure the tube is between those layers. 8. Tape the dressing to your skin. 9. Tape the drainage tube to your skin 1-2 inches (2.5-5 cm) below the place where the tube enters your body. Taping keeps the tube from pulling on any stitches (sutures) that you have. 10. Wash your hands with soap and water. 11. Write down the color of your drainage and how often you change your dressing. How to  empty your active drain 1. Make sure that you have a measuring cup that you can empty your drainage into. 2. Wash your hands with soap and water. If soap and water are not available, use hand sanitizer. 3. Loosen any pins or clips that hold the tube in place. 4. If your health care provider tells you to strip the tube to prevent clots and tube blockages: ? Hold the tube at the skin with one hand. Use your other hand to pinch the tubing with your thumb and first finger. ? Gently move your fingers down the tube while squeezing  very lightly. This clears any drainage, clots, or tissue from the tube. ? You may need to do this several times each day to keep the tube clear. Do not pull on the tube. 5. Open the bulb cap or the drain plug. Do not touch the inside of the cap or the bottom of the plug. 6. Turn the device upside down and gently squeeze. 7. Empty all of the drainage into the measuring cup. 8. Compress the bulb or the container and replace the cap or the plug. To compress the bulb or the container, squeeze it firmly in the middle while you close the cap or plug the container. 9. Write down the amount of drainage that you have in each 24-hour period. If you have less than 2 Tbsp (30 mL) of drainage during 24 hours, contact your health care provider. 10. Flush the drainage down the toilet. 11. Wash your hands with soap and water.   Contact a health care provider if:  You have redness, swelling, or pain around your drain area.  You have pus or a bad smell coming from your drain area.  You have a fever or chills.  The skin around your drain is warm to the touch.  The amount of drainage that you have is increasing instead of decreasing.  You have drainage that is cloudy.  There is a sudden stop or a sudden decrease in the amount of drainage that you have.  Your drain tube falls out.  Your active drain does not stay compressed after you empty it. Summary  Surgical drains are used to remove extra fluid that normally builds up in a surgical wound after surgery.  Different kinds of surgical drains include active drains and passive drains. Active drains use suction to pull drainage away from the surgical wound, and passive drains allow fluid to drain naturally.  It is important to care for your drain to prevent infection. If your drain is placed at your back, or any other hard-to-reach area, ask another person to assist you.  Contact your health care provider if you have redness, swelling, or pain around  your drain area. This information is not intended to replace advice given to you by your health care provider. Make sure you discuss any questions you have with your health care provider. Document Revised: 11/10/2018 Document Reviewed: 11/10/2018 Elsevier Patient Education  2021 [employer].

## 2021-02-14 NOTE — Sedation Documentation (Signed)
Second attempt to call report to [facility].

## 2021-02-14 NOTE — Sedation Documentation (Signed)
[facility] called to give report. Caryl Pina states they will need to call me back.

## 2021-02-15 ENCOUNTER — Other Ambulatory Visit (HOSPITAL_COMMUNITY): Payer: Self-pay

## 2021-02-15 MED ORDER — NORMAL SALINE NICU FLUSH
Freq: Two times a day (BID) | INTRAVENOUS | 0 refills | Status: DC
Start: 2021-02-14 — End: 2021-10-08
  Filled 2021-02-15: qty 140, 7d supply, fill #0
  Filled 2021-02-15: qty 300, 30d supply, fill #0

## 2021-02-19 ENCOUNTER — Encounter: Payer: Self-pay | Admitting: *Deleted

## 2021-02-19 ENCOUNTER — Other Ambulatory Visit: Payer: Self-pay | Admitting: Student in an Organized Health Care Education/Training Program

## 2021-02-19 ENCOUNTER — Ambulatory Visit
Admission: RE | Admit: 2021-02-19 | Discharge: 2021-02-19 | Disposition: A | Discharge status: Home / Self Care | Payer: Medicare Other | Source: Ambulatory Visit | Attending: Radiology | Admitting: Radiology

## 2021-02-19 DIAGNOSIS — S301XXA Contusion of abdominal wall, initial encounter: Secondary | ICD-10-CM

## 2021-02-19 DIAGNOSIS — T8189XA Other complications of procedures, not elsewhere classified, initial encounter: Secondary | ICD-10-CM | POA: Diagnosis not present

## 2021-02-19 HISTORY — PX: IR RADIOLOGIST EVAL & MGMT: IMG5224

## 2021-02-19 NOTE — Progress Notes (Signed)
Referring Physician(s): Dr. Donell Beers  Chief Complaint: The patient is seen in follow up today s/p peritoneal intra abdominal abscess drain placement on 4.28.22 by Dr. Vella Redhead  History of present illness: 40 y.o. male outpatient. History of Beckers Muscular Dystrophy, bipolar, HLD, GERD, DM, ventral hernia repair on 3.18.22. Presented to the ED with abdominal pain found to have a large and complex found to have a large fluid collection on the anterior aspect of the abdominal wall. on 4.28.22.  IR placed a modified abscess drain in the complex and loculated sub peritoneal fluid collection with aspiration of 1 Liters of old blood and aspiration of 2 additional fluid collections. Cultures showed few WBC with no organism. Mr. Polinski is reporting scant output. The drain is to JP suction device. He is flushing the drain twice a day. He is not tracking output but  states that output is declining and "not much coming out now"Output is brown in nature.  He reports immediate resolution of pain with drain placement. He denies any nausea, vomiting diarrhea or fevers. He states that he is eager to go back to work. He is being followed by Dr. Donell Beers and states that he is scheduled to follow up with him again next week.  Past Medical History:  Diagnosis Date  . Allergy   . Anxiety   . Becker's muscular dystrophy (HCC)   . Bipolar disorder (HCC)    On disability  . Depression   . Diabetes mellitus without complication (HCC)    per patient : under control with diet, does not monitor cbg at home   . GERD (gastroesophageal reflux disease)   . Headache(784.0)    daily headaches  . Hyperlipidemia   . Neuromuscular disorder (HCC)    Beckers muscular dystrophy  . Obesity   . PTSD (post-traumatic stress disorder)   . Sleep apnea    Does not tolerate CPAP    Past Surgical History:  Procedure Laterality Date  . APPLICATION OF WOUND VAC N/A 11/23/2018   Procedure: Application Of Wound Vac;  Surgeon:  Kinsinger, De Blanch, MD;  Location: [facility] OR;  Service: General;  Laterality: N/A;  . COLECTOMY N/A 11/23/2018   Procedure: TOTAL COLECTOMY;  Surgeon: Sheliah Hatch De Blanch, MD;  Location: [facility] OR;  Service: General;  Laterality: N/A;  . ILEOSTOMY N/A 11/23/2018   Procedure: ILEOSTOMY;  Surgeon: Kinsinger, De Blanch, MD;  Location: [facility] OR;  Service: General;  Laterality: N/A;  . ILEOSTOMY CLOSURE N/A 05/19/2019   Procedure: ILEOSTOMY REVERSAL WITH ILEOCOLIC AND COLORECTAL ANASTOMOSIS;  Surgeon: Sheliah Hatch De Blanch, MD;  Location: [facility] ORS;  Service: General;  Laterality: N/A;  . INCISIONAL HERNIA REPAIR N/A 01/04/2021   Procedure: LAPAROSCOPIC INCISIONAL HERNIA REPAIR WITH MESH; LYSIS OF ADHESIONS;  Surgeon: Sheliah Hatch De Blanch, MD;  Location: [facility];  Service: General;  Laterality: N/A;  . IR GUIDED DRAIN W CATHETER PLACEMENT  02/14/2021  . NM MYOCAR PERF WALL MOTION  01/22/2011   protocol: Persantine, moderate reversible inferior defect post stress EF 48%, high risk scan  . TONSILLECTOMY    . TRANSTHORACIC ECHOCARDIOGRAM  07/05/2004   EF=>55% normal study     Allergies: Metformin and related, Penicillins, and Nsaids  Medications: Prior to Admission medications   Medication Sig Start Date End Date Taking? Authorizing Provider  Blood Glucose Monitoring Suppl (ONE TOUCH ULTRA 2) w/Device KIT Measure blood sugar daily and with meals for insulin 01/08/21   Anderson, Chelsey L, DO  busPIRone (BUSPAR) 10 MG tablet Take 10 mg  by mouth 3 (three) times daily. 10/25/20   [provider]  diphenhydramine-acetaminophen (TYLENOL PM) 25-500 MG TABS tablet Take 3 tablets by mouth at bedtime as needed (Sleep).    [provider]  Dulaglutide (TRULICITY) 0.75 MG/0.5ML SOPN Inject 0.75 mg into the skin once a week. 02/06/21   Anderson, Chelsey L, DO  gabapentin (NEURONTIN) 300 MG capsule Take 300 mg by mouth daily as needed (pain). 06/20/16   [provider]  glucose blood (ONETOUCH ULTRA)  test strip USE TO TEST TWO TIMES A DAY 04/06/20   Anderson, Chelsey L, DO  ibuprofen (ADVIL) 800 MG tablet Take 1 tablet (800 mg total) by mouth every 8 (eight) hours as needed. Patient not taking: Reported on 02/06/2021 01/04/21   Kinsinger, De Blanch, MD  Insulin Pen Needle 32G X 6 MM MISC 1 packet by Does not apply route 2 (two) times daily. Patient not taking: Reported on 02/06/2021 03/29/20   Jamelle Rushing L, DO  JARDIANCE 25 MG TABS tablet TAKE ONE TABLET BY MOUTH DAILY. 01/14/21   Anderson, Chelsey L, DO  Lancets (ONETOUCH DELICA PLUS LANCET33G) MISC USE TO TEST TWO TIMES A DAY Patient not taking: No sig reported 12/21/19   Jamelle Rushing L, DO  LANTUS SOLOSTAR 100 UNIT/ML Solostar Pen INJECT 40 UNITS INTO THE SKIN EVERY MORNING AS DIRECTED 02/14/21   Dareen Piano, Chelsey L, DO  lurasidone (LATUDA) 40 MG TABS tablet Take 1 tablet (40 mg total) by mouth at bedtime. 01/04/21   Kinsinger, De Blanch, MD  Melatonin 1 MG SUBL Place 1 mg under the tongue at bedtime. 02/06/21   Anderson, Chelsey L, DO  ns flush (NS FLUSH) 0.9% SOLN Flush catheter 1 to 2 times daily with 5 to 75ml's of saline 02/14/21   Hoyt Koch, PA  omeprazole (PRILOSEC) 20 MG capsule TAKE 2 CAPSULES (40 MG TOTAL) BY MOUTH AT BEDTIME. 12/19/20   Anderson, Chelsey L, DO  oxyCODONE (OXY IR/ROXICODONE) 5 MG immediate release tablet Take 1-2 tablets (5-10 mg total) by mouth every 4 (four) hours as needed for moderate pain. Patient not taking: Reported on 02/06/2021 01/04/21   Kinsinger, De Blanch, MD  oxyCODONE (ROXICODONE) 5 MG immediate release tablet Take 1 tablet (5 mg total) by mouth every 6 (six) hours as needed for up to 12 doses for severe pain. 01/29/21   Terald Sleeper, MD  promethazine (PHENERGAN) 50 MG tablet Take 1 tablet (50 mg total) by mouth every 6 (six) hours as needed for nausea or vomiting. 02/06/21   Jamelle Rushing L, DO  traZODone (DESYREL) 100 MG tablet Take 50-100 mg by mouth at bedtime. 12/05/19    [provider]  VYVANSE 20 MG capsule Take 20 mg by mouth daily with breakfast. Take with 50 mg for a total of 70 mg 06/28/18   [provider]  VYVANSE 50 MG capsule Take 50 mg by mouth See admin instructions. Take with 20 mg for a total of 70 mg in the morning Patient not taking: Reported on 02/06/2021 06/28/18   [provider]     Family History  Problem Relation Age of Onset  . COPD Mother   . Depression Mother   . Diabetes Mother   . Hyperlipidemia Mother   . Asthma Father   . Arthritis Father   . Diabetes Father   . Heart disease Father   . Hyperlipidemia Father   . Hypertension Father   . Hypertension Brother     Social History  Socioeconomic History  . Marital status: Married    Spouse name: Not on file  . Number of children: 2  . Years of education: 40  . Highest education level: Not on file  Occupational History    Employer: UNEMPLOYED  Tobacco Use  . Smoking status: Current Every Day Smoker    Packs/day: 1.50    Years: 21.00    Pack years: 31.50    Types: Cigarettes  . Smokeless tobacco: Never Used  Vaping Use  . Vaping Use: Former  . Devices: Increased nicotine cravings  Substance and Sexual Activity  . Alcohol use: No    Alcohol/week: 0.0 standard drinks    Comment: quit 2013  . Drug use: Not Currently    Types: Marijuana    Comment: 7-27  . Sexual activity: Yes    Birth control/protection: None  Other Topics Concern  . Not on file  Social History Narrative   Patient lives at home with parents, brother and brothers wife.    Patient is single.    Patient has 2 children.    Patient has a high school education.    Patient is unemployed.    Patient is right handed.    Social Determinants of Health   Financial Resource Strain: Not on file  Food Insecurity: Not on file  Transportation Needs: Not on file  Physical Activity: Not on file  Stress: Not on file  Social Connections: Not on file     Vital Signs: There  were no vitals taken for this visit.  Physical Exam Vitals and nursing note reviewed.  Constitutional:      Appearance: He is well-developed.  HENT:     Head: Normocephalic.  Pulmonary:     Effort: Pulmonary effort is normal.  Abdominal:     Comments: Positive RUQ drain to JP suction. Site is unremarkable with no erythema, edema, tenderness, bleeding or drainage noted at exit site. Suture in place. Original dressing present. 45 ml of  dark colored fluid noted in bulb suction device.   Musculoskeletal:        General: Normal range of motion.     Cervical back: Normal range of motion.  Skin:    General: Skin is dry.  Neurological:     Mental Status: He is alert and oriented to person, place, and time.     Imaging: No results found.  Labs:  CBC: Recent Labs    01/04/21 1124 01/05/21 0742 01/29/21 1844 02/14/21 0824  WBC 11.5* 9.7 9.9 7.8  HGB 14.7 12.4* 12.5* 13.1  HCT 45.2 37.4* 39.1 41.4  PLT 184 203 310 224    COAGS: Recent Labs    02/14/21 0824  INR 1.0    BMP: Recent Labs    03/29/20 1631 06/30/20 2348 07/29/20 1528 11/21/20 1358 12/25/20 1126 01/04/21 1124 01/05/21 0742 01/29/21 1844  NA 138 133*   < > 137 135  --  134* 135  K 4.0 2.6*   < > 4.2 4.1  --  3.7 3.5  CL 98 92*   < > 99 99  --  98 98  CO2 23 28   < > 25 27  --  25 27  GLUCOSE 283* 245*   < > 290* 257*  --  259* 254*  BUN 5* 7   < > 8 8  --  18 11  CALCIUM 9.5 9.3   < > 9.4 8.8*  --  8.4* 9.1  CREATININE 0.94 0.87   < >  0.87 0.80 1.00 0.82 0.75  GFRNONAA 102 >60   < > 109 >60 >60 >60 >60  GFRAA 118 >60  --  126  --   --   --   --    < > = values in this interval not displayed.    LIVER FUNCTION TESTS: Recent Labs    03/29/20 1631 06/30/20 2348 09/26/20 1432 01/29/21 1844  BILITOT <0.2 0.5 0.5 0.5  AST 40 $Remo'29 18 15  'CGSmt$ ALT 40 43 25 16  ALKPHOS 111 87 87 87  PROT 8.0 8.1 7.5 7.8  ALBUMIN 4.5 4.3 4.0 3.9    Assessment:   40 y.o. male outpatient. History of Beckers  Muscular Dystrophy, bipolar, HLD, GERD, DM, ventral hernia repair on 3.18.22. Presented to the ED with abdominal pain found to have a large and complex found to have a large fluid collection on the anterior aspect of the abdominal wall. on 4.28.22.  IR placed a modified abscess drain in the complex and loculated sub peritoneal fluid collection with aspiration of 1 Liters of old blood and aspiration of 2 additional fluid collections. Cultures showed few WBC with no organism. Patient is being followed by Dr. Barry Dienes and is scheduled to see him again next week.   Mr. Bridge presents today for a drain follow up. He is not on antibiotics.He reports immediate resolution of pain with drain placement.  He denies any fevers, headache, SOB, cough, chest pain, nausea, vomiting or bleeding. He is flushing the drain twice a day. He is not tracking output but states that output is declining. Output is brown in nature. Approximately 45 ml of dark brown fluid noted to be in the bulb device. It is unclear the last time the drain as been emptied. Mr. Plessinger does state that output is down trending and states that there is "not much coming out now". Site is unremarkable with no erythema, edema, tenderness, bleeding or drainage noted at exit site. Original dressing present.      Discussed case with Dr. Jacqualyn Posey who recommends drain removal at this time. The drain removed intact, no complications,  patient tolerated procedure well, dressing applied to exit site.  Post-removal instructions: 1- Okay to shower/sponge bath 24 hours post-removal. 2- No submerging (swimming, bathing) for 7 days post-removal. 3- Change dressing PRN until site fully healed.  Patient is in agreement with the plan of care and verbalized understanding  Signed: Jacqualine Mau, NP 02/19/2021, 1:18 PM   Please refer to Dr. Earleen Newport attestation of this note for management and plan.

## 2021-02-20 LAB — AEROBIC/ANAEROBIC CULTURE W GRAM STAIN (SURGICAL/DEEP WOUND): Culture: NO GROWTH

## 2021-02-22 ENCOUNTER — Other Ambulatory Visit: Payer: Self-pay | Admitting: Student in an Organized Health Care Education/Training Program

## 2021-02-22 DIAGNOSIS — K219 Gastro-esophageal reflux disease without esophagitis: Secondary | ICD-10-CM

## 2021-02-24 ENCOUNTER — Other Ambulatory Visit: Payer: Self-pay

## 2021-02-24 ENCOUNTER — Encounter: Payer: Self-pay | Admitting: Emergency Medicine

## 2021-02-24 ENCOUNTER — Ambulatory Visit
Admission: EM | Admit: 2021-02-24 | Discharge: 2021-02-24 | Disposition: A | Discharge status: Home / Self Care | Payer: Medicare Other | Attending: Family Medicine | Admitting: Family Medicine

## 2021-02-24 DIAGNOSIS — R739 Hyperglycemia, unspecified: Secondary | ICD-10-CM

## 2021-02-24 DIAGNOSIS — R509 Fever, unspecified: Secondary | ICD-10-CM

## 2021-02-24 DIAGNOSIS — E1165 Type 2 diabetes mellitus with hyperglycemia: Secondary | ICD-10-CM

## 2021-02-24 DIAGNOSIS — Z1152 Encounter for screening for COVID-19: Secondary | ICD-10-CM | POA: Diagnosis not present

## 2021-02-24 DIAGNOSIS — K432 Incisional hernia without obstruction or gangrene: Secondary | ICD-10-CM

## 2021-02-24 DIAGNOSIS — Z794 Long term (current) use of insulin: Secondary | ICD-10-CM

## 2021-02-24 LAB — POCT URINALYSIS DIP (MANUAL ENTRY)
Bilirubin, UA: NEGATIVE
Glucose, UA: >=1000 mg/dL — AB
Ketones, POC UA: NEGATIVE mg/dL
Leukocytes, UA: NEGATIVE
Nitrite, UA: NEGATIVE
Protein Ur, POC: 100 mg/dL — AB
Spec Grav, UA: 1.01 (ref 1.010–1.025)
Urobilinogen, UA: 0.2 E.U./dL
pH, UA: 6 (ref 5.0–8.0)

## 2021-02-24 LAB — POCT FASTING CBG KUC MANUAL ENTRY: POCT Glucose (KUC): 330 mg/dL — AB (ref 70–99)

## 2021-02-24 NOTE — ED Triage Notes (Signed)
Fever and body aches  x 2 days.  Pt had an abd surgery in march.  Had drain placed in abd x 2 weeks ago and was taken out this past Tuesday.  Was told to be seen if he ran a fever.  Pt states temp has been 102-103.

## 2021-02-24 NOTE — Discharge Instructions (Addendum)
If you develop any severe pain in your abdomen or you continue to have any fever greater than 102 tomorrow, I would like for you to be evaluated in the emergency department. Your blood work will be back sometime tomorrow and you will be notifed if the results are abnormal. Continue tylenol and ibuprofen for fever management Drink water, you are dehydrated which can worsen and increase blood sugars. Monitor blood sugars, any reading >400, go to the emergency department immediately

## 2021-02-24 NOTE — ED Provider Notes (Signed)
[facility]    CSN: [id number] Arrival date & time: 02/24/21  1224      History   Chief Complaint Chief Complaint  Patient presents with  . Fever    HPI [name] is a 40 y.o. male.   HPI  Patient with uncontrolled diabetes and recent history of abdominal surgical repair of ventral hernia presents today with 3 days of fever, diarrhea, generalized body aches. Fever TMAX 103. Reports he had abdominal drain removed 5 days ago which was placed to drained fluid collection resulting from abdominal hernia repair surgery in March. He denies any abdominal pain. He has diabetes and his average home BS readings are in 200's -250 at his baseline. He endorses intake of Gatorade and sports drinks and not as much water. BS 330 here today clinic. Last fever today and resolved after tylenol. He endorses generalized body aches. No abdominal pain, nausea, vomiting or weakness.    Past Medical History:  Diagnosis Date  . Allergy   . Anxiety   . Becker's muscular dystrophy ([place])   . Bipolar disorder ([place])    On disability  . Depression   . Diabetes mellitus without complication ([place])    per patient : under control with diet, does not monitor cbg at home   . GERD (gastroesophageal reflux disease)   . Headache(784.0)    daily headaches  . Hyperlipidemia   . Neuromuscular disorder ([place])    Beckers muscular dystrophy  . Obesity   . PTSD (post-traumatic stress disorder)   . Sleep apnea    Does not tolerate CPAP    Patient Active Problem List   Diagnosis Date Noted  . Insomnia 02/06/2021  . Incisional hernia 01/04/2021  . Ventral hernia 11/21/2020  . Anxiety   . Shoulder injury, subsequent encounter 06/29/2020  . High priority for COVID-19 virus vaccination 03/29/2020  . Type 2 diabetes mellitus with hyperglycemia ([place]) 03/01/2020  . Abdominal pain 11/22/2018  . Memory difficulty 10/04/2018  . Erectile dysfunction 01/29/2018  . Neck pain 11/12/2016  . Back pain 10/25/2016   . Allergic rhinitis 04/28/2016  . GERD (gastroesophageal reflux disease) 03/21/2013  . Elevated BP 04/25/2012  . Narcotic abuse ([place]) 06/05/2011  . OBESITY 10/17/2010  . 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 SLEEP APNEA 01/24/2009  . BIPOLAR DISORDER UNSPECIFIED 05/05/2008  . HYPERLIPIDEMIA 03/30/2008  . ABUSE, ALCOHOL, UNSPECIFIED 04/26/2007  . TOBACCO ABUSE 12/25/2006    Past Surgical History:  Procedure Laterality Date  . APPLICATION OF WOUND VAC N/A 11/23/2018   Procedure: Application Of Wound Vac;  Surgeon: Kinsinger, Arta Bruce, MD;  Location: [place];  Service: General;  Laterality: N/A;  . COLECTOMY N/A 11/23/2018   Procedure: TOTAL COLECTOMY;  Surgeon: Kieth Brightly Arta Bruce, MD;  Location: [place];  Service: General;  Laterality: N/A;  . ILEOSTOMY N/A 11/23/2018   Procedure: ILEOSTOMY;  Surgeon: Kinsinger, Arta Bruce, MD;  Location: [place];  Service: General;  Laterality: N/A;  . ILEOSTOMY CLOSURE N/A 05/19/2019   Procedure: ILEOSTOMY REVERSAL WITH ILEOCOLIC AND COLORECTAL ANASTOMOSIS;  Surgeon: Kieth Brightly Arta Bruce, MD;  Location: [facility];  Service: General;  Laterality: N/A;  . INCISIONAL HERNIA REPAIR N/A 01/04/2021   Procedure: LAPAROSCOPIC INCISIONAL HERNIA REPAIR WITH MESH; LYSIS OF ADHESIONS;  Surgeon: Kieth Brightly Arta Bruce, MD;  Location: [facility];  Service: General;  Laterality: N/A;  . IR GUIDED DRAIN W CATHETER PLACEMENT  02/14/2021  . IR RADIOLOGIST EVAL & MGMT  02/19/2021  . NM MYOCAR PERF WALL MOTION  01/22/2011   protocol: Persantine, moderate reversible inferior defect post stress EF 48%, high risk scan  . TONSILLECTOMY    . TRANSTHORACIC ECHOCARDIOGRAM  07/05/2004   EF=>55% normal study        Home Medications    Prior to Admission medications   Medication Sig Start Date End Date Taking? Authorizing Provider  Blood Glucose Monitoring Suppl (ONE TOUCH ULTRA 2) w/Device KIT Measure blood sugar daily and with meals for insulin 01/08/21   Anderson, Chelsey L, DO  busPIRone  (BUSPAR) 10 MG tablet Take 10 mg by mouth 3 (three) times daily. 10/25/20   [provider]  diphenhydramine-acetaminophen (TYLENOL PM) 25-500 MG TABS tablet Take 3 tablets by mouth at bedtime as needed (Sleep).    [provider]  Dulaglutide (TRULICITY) 4.00 QQ/7.6PP SOPN Inject 0.75 mg into the skin once a week. 02/06/21   Anderson, Chelsey L, DO  gabapentin (NEURONTIN) 300 MG capsule Take 300 mg by mouth daily as needed (pain). 06/20/16   [provider]  ibuprofen (ADVIL) 800 MG tablet Take 1 tablet (800 mg total) by mouth every 8 (eight) hours as needed. Patient not taking: Reported on 02/06/2021 01/04/21   Kinsinger, Arta Bruce, MD  Insulin Pen Needle 32G X 6 MM MISC 1 packet by Does not apply route 2 (two) times daily. Patient not taking: Reported on 02/06/2021 03/29/20   Doristine Mango L, DO  JARDIANCE 25 MG TABS tablet TAKE ONE TABLET BY MOUTH DAILY. 01/14/21   Anderson, Chelsey L, DO  Lancets (ONETOUCH DELICA PLUS [id number]) [place] USE TO TEST TWO TIMES A DAY Patient not taking: No sig reported 12/21/19   Doristine Mango L, DO  LANTUS SOLOSTAR 100 UNIT/ML Solostar Pen INJECT 40 UNITS INTO THE SKIN EVERY MORNING AS DIRECTED 02/14/21   Ouida Sills, Chelsey L, DO  lurasidone (LATUDA) 40 MG TABS tablet Take 1 tablet (40 mg total) by mouth at bedtime. 01/04/21   Kinsinger, Arta Bruce, MD  Melatonin 1 MG SUBL Place 1 mg under the tongue at bedtime. 02/06/21   Anderson, Chelsey L, DO  ns flush (NS FLUSH) 0.9% SOLN Flush catheter 1 to 2 times daily with 5 to 32ml's of saline 02/14/21   Docia Barrier, PA  omeprazole (PRILOSEC) 20 MG capsule TAKE 2 CAPSULES (40 MG TOTAL) BY MOUTH AT BEDTIME. 12/19/20   Anderson, Chelsey L, DO  ONETOUCH ULTRA test strip USE TO TEST TWO TIMES A DAY 02/19/21   Anderson, Chelsey L, DO  oxyCODONE (OXY IR/ROXICODONE) 5 MG immediate release tablet Take 1-2 tablets (5-10 mg total) by mouth every 4 (four) hours as needed for moderate pain. Patient not  taking: Reported on 02/06/2021 01/04/21   Kinsinger, Arta Bruce, MD  oxyCODONE (ROXICODONE) 5 MG immediate release tablet Take 1 tablet (5 mg total) by mouth every 6 (six) hours as needed for up to 12 doses for severe pain. 01/29/21   Wyvonnia Dusky, MD  promethazine (PHENERGAN) 50 MG tablet Take 1 tablet (50 mg total) by mouth every 6 (six) hours as needed for nausea or vomiting. 02/06/21   Doristine Mango L, DO  traZODone (DESYREL) 100 MG tablet Take 50-100 mg by mouth at bedtime. 12/05/19   [provider]  VYVANSE 20 MG capsule Take 20 mg by mouth daily with breakfast. Take with 50 mg for a total of 70 mg 06/28/18   [provider]  VYVANSE 50 MG capsule Take 50 mg by mouth See admin instructions. Take with 20 mg for a total  of 70 mg in the morning Patient not taking: Reported on 02/06/2021 06/28/18   [provider]    Family History Family History  Problem Relation Age of Onset  . COPD Mother   . Depression Mother   . Diabetes Mother   . Hyperlipidemia Mother   . Asthma Father   . Arthritis Father   . Diabetes Father   . Heart disease Father   . Hyperlipidemia Father   . Hypertension Father   . Hypertension Brother     Social History Social History   Tobacco Use  . Smoking status: Current Every Day Smoker    Packs/day: 1.50    Years: 21.00    Pack years: 31.50    Types: Cigarettes  . Smokeless tobacco: Never Used  Vaping Use  . Vaping Use: Former  . Devices: Increased nicotine cravings  Substance Use Topics  . Alcohol use: No    Alcohol/week: 0.0 standard drinks    Comment: quit 2013  . Drug use: Not Currently    Types: Marijuana    Comment: 7-27     Allergies   Metformin and related, Penicillins, and Nsaids   Review of Systems Review of Systems Pertinent negatives listed in HPI    Physical Exam Triage Vital Signs ED Triage Vitals  Enc Vitals Group     BP 02/24/21 1307 122/76     Pulse Rate 02/24/21 1307 (!) 122     Resp  02/24/21 1307 18     Temp 02/24/21 1307 99 F (37.2 C)     Temp Source 02/24/21 1307 Oral     SpO2 02/24/21 1307 96 %     Weight --      Height --      Head Circumference --      Peak Flow --      Pain Score 02/24/21 1306 8     Pain Loc --      Pain Edu? --      Excl. in [place]? --    No data found.  Updated Vital Signs BP 122/76 (BP Location: Right Arm)   Pulse (!) 122   Temp 99 F (37.2 C) (Oral)   Resp 18   SpO2 96%   Visual Acuity Right Eye Distance:   Left Eye Distance:   Bilateral Distance:    Right Eye Near:   Left Eye Near:    Bilateral Near:     Physical Exam Constitutional:      Appearance: He is obese.     Comments: Chronically unhealthy appearing   HENT:     Head: Normocephalic.  Eyes:     Extraocular Movements: Extraocular movements intact.     Pupils: Pupils are equal, round, and reactive to light.  Cardiovascular:     Rate and Rhythm: Tachycardia present.  Pulmonary:     Effort: Pulmonary effort is normal.     Breath sounds: Normal breath sounds.  Abdominal:     General: There is distension.     Tenderness: There is no abdominal tenderness. There is no right CVA tenderness, left CVA tenderness, guarding or rebound.     Hernia: No hernia is present.  Musculoskeletal:        General: Normal range of motion.     Cervical back: Normal range of motion.  Lymphadenopathy:     Cervical: No cervical adenopathy.  Skin:    General: Skin is warm and dry.     Capillary Refill: Capillary refill takes less than 2  seconds.     Coloration: Skin is not pale.     Findings: No erythema.  Neurological:     General: No focal deficit present.     Mental Status: He is alert. Mental status is at baseline.      UC Treatments / Results  Labs (all labs ordered are listed, but only abnormal results are displayed) Labs Reviewed  POCT FASTING CBG [facility] MANUAL ENTRY - Abnormal; Notable for the following components:      Result Value   POCT Glucose ([facility]) 330 (*)     All other components within normal limits  POCT URINALYSIS DIP (MANUAL ENTRY) - Abnormal; Notable for the following components:   Glucose, UA >=1,000 (*)    Blood, UA trace-intact (*)    Protein Ur, POC =100 (*)    All other components within normal limits  COVID-19, FLU A+B NAA  COMPREHENSIVE METABOLIC PANEL  CBC WITH DIFFERENTIAL/PLATELET    EKG   Radiology No results found.  Procedures Procedures (including critical care time)  Medications Ordered in UC Medications - No data to display  Initial Impression / Assessment and Plan / UC Course  I have reviewed the triage vital signs and the nursing notes.  Pertinent labs & imaging results that were available during my care of the patient were reviewed by me and considered in my medical decision making (see chart for details).    Fever of unknown etiology. Abdominal exam negative for peritoneal signs however, given recent history of removal of abdominal drain unable to rule out an abdominal abscess evolving. Patient would like to avoid ER however discussed in depth RED flags warranting ER evaluation.  COVID/FlU pending to rule out virus as source of symptoms. CBC and CMP pending to check WBC, HGB, and metabolic status. BS is elevated however, pt has been drinking orange juice all day. Advised to hydrate with water. Continue Tylenol and Ibuprofen for fever management.  Final Clinical Impressions(s) / UC Diagnoses   Final diagnoses:  Fever, unspecified  Hyperglycemia  Type 2 diabetes mellitus with hyperglycemia, with long-term current use of insulin (HCC)  Incisional hernia, without obstruction or gangrene  Encounter for screening for COVID-19     Discharge Instructions     If you develop any severe pain in your abdomen or you continue to have any fever greater than 102 tomorrow, I would like for you to be evaluated in the emergency department. Your blood work will be back sometime tomorrow and you will be notifed if the  results are abnormal. Continue tylenol and ibuprofen for fever management Drink water, you are dehydrated which can worsen and increase blood sugars. Monitor blood sugars, any reading >400, go to the emergency department immediately    ED Prescriptions    None     PDMP not reviewed this encounter.   Scot Jun, FNP 02/24/21 1401

## 2021-02-25 ENCOUNTER — Other Ambulatory Visit: Payer: Self-pay

## 2021-02-25 ENCOUNTER — Telehealth (HOSPITAL_COMMUNITY): Payer: Self-pay | Admitting: Emergency Medicine

## 2021-02-25 ENCOUNTER — Emergency Department (HOSPITAL_COMMUNITY): Payer: Medicare Other

## 2021-02-25 ENCOUNTER — Encounter (HOSPITAL_COMMUNITY): Payer: Self-pay | Admitting: Radiology

## 2021-02-25 ENCOUNTER — Emergency Department (HOSPITAL_COMMUNITY)
Admission: EM | Admit: 2021-02-25 | Discharge: 2021-02-25 | Disposition: A | Discharge status: Home / Self Care | Payer: Medicare Other | Attending: Emergency Medicine | Admitting: Emergency Medicine

## 2021-02-25 DIAGNOSIS — T8143XA Infection following a procedure, organ and space surgical site, initial encounter: Secondary | ICD-10-CM | POA: Insufficient documentation

## 2021-02-25 DIAGNOSIS — L729 Follicular cyst of the skin and subcutaneous tissue, unspecified: Secondary | ICD-10-CM | POA: Diagnosis not present

## 2021-02-25 DIAGNOSIS — K820 Obstruction of gallbladder: Secondary | ICD-10-CM | POA: Diagnosis not present

## 2021-02-25 DIAGNOSIS — K651 Peritoneal abscess: Secondary | ICD-10-CM | POA: Insufficient documentation

## 2021-02-25 DIAGNOSIS — Z79899 Other long term (current) drug therapy: Secondary | ICD-10-CM | POA: Diagnosis not present

## 2021-02-25 DIAGNOSIS — Z794 Long term (current) use of insulin: Secondary | ICD-10-CM | POA: Diagnosis not present

## 2021-02-25 DIAGNOSIS — K6811 Postprocedural retroperitoneal abscess: Secondary | ICD-10-CM | POA: Diagnosis not present

## 2021-02-25 DIAGNOSIS — I700 Atherosclerosis of aorta: Secondary | ICD-10-CM | POA: Diagnosis not present

## 2021-02-25 DIAGNOSIS — F17210 Nicotine dependence, cigarettes, uncomplicated: Secondary | ICD-10-CM | POA: Insufficient documentation

## 2021-02-25 DIAGNOSIS — L989 Disorder of the skin and subcutaneous tissue, unspecified: Secondary | ICD-10-CM | POA: Diagnosis not present

## 2021-02-25 DIAGNOSIS — E119 Type 2 diabetes mellitus without complications: Secondary | ICD-10-CM | POA: Diagnosis not present

## 2021-02-25 DIAGNOSIS — R1084 Generalized abdominal pain: Secondary | ICD-10-CM | POA: Diagnosis present

## 2021-02-25 LAB — CBC WITH DIFFERENTIAL/PLATELET
Abs Immature Granulocytes: 0.07 10*3/uL (ref 0.00–0.07)
Basophils Absolute: 0.1 10*3/uL (ref 0.0–0.2)
Basophils Absolute: 0.1 10*3/uL (ref 0.0–0.1)
Basophils Relative: 0 %
Basos: 0 %
EOS (ABSOLUTE): 0.3 10*3/uL (ref 0.0–0.4)
Eos: 2 %
Eosinophils Absolute: 0.5 10*3/uL (ref 0.0–0.5)
Eosinophils Relative: 3 %
HCT: 37.8 % — ABNORMAL LOW (ref 39.0–52.0)
Hematocrit: 40.5 % (ref 37.5–51.0)
Hemoglobin: 13.3 g/dL (ref 13.0–17.7)
Hemoglobin: 12.4 g/dL — ABNORMAL LOW (ref 13.0–17.0)
Immature Grans (Abs): 0.1 10*3/uL (ref 0.0–0.1)
Immature Granulocytes: 1 %
Immature Granulocytes: 1 %
Lymphocytes Absolute: 2 10*3/uL (ref 0.7–3.1)
Lymphocytes Relative: 16 %
Lymphs Abs: 2.2 10*3/uL (ref 0.7–4.0)
Lymphs: 12 %
MCH: 25.9 pg — ABNORMAL LOW (ref 26.6–33.0)
MCH: 26.2 pg (ref 26.0–34.0)
MCHC: 32.8 g/dL (ref 31.5–35.7)
MCHC: 32.8 g/dL (ref 30.0–36.0)
MCV: 79 fL (ref 79–97)
MCV: 79.9 fL — ABNORMAL LOW (ref 80.0–100.0)
Monocytes Absolute: 0.9 10*3/uL (ref 0.1–0.9)
Monocytes Absolute: 0.9 10*3/uL (ref 0.1–1.0)
Monocytes Relative: 6 %
Monocytes: 5 %
Neutro Abs: 10 10*3/uL — ABNORMAL HIGH (ref 1.7–7.7)
Neutrophils Absolute: 13 10*3/uL — ABNORMAL HIGH (ref 1.4–7.0)
Neutrophils Relative %: 74 %
Neutrophils: 80 %
Platelets: 242 10*3/uL (ref 150–450)
Platelets: 234 10*3/uL (ref 150–400)
RBC: 5.13 x10E6/uL (ref 4.14–5.80)
RBC: 4.73 MIL/uL (ref 4.22–5.81)
RDW: 14 % (ref 11.6–15.4)
RDW: 14 % (ref 11.5–15.5)
WBC: 16.3 10*3/uL — ABNORMAL HIGH (ref 3.4–10.8)
WBC: 13.7 10*3/uL — ABNORMAL HIGH (ref 4.0–10.5)
nRBC: 0 % (ref 0.0–0.2)

## 2021-02-25 LAB — COMPREHENSIVE METABOLIC PANEL
ALT: 8 IU/L (ref 0–44)
ALT: 14 U/L (ref 0–44)
AST: 9 IU/L (ref 0–40)
AST: 14 U/L — ABNORMAL LOW (ref 15–41)
Albumin/Globulin Ratio: 1.2 (ref 1.2–2.2)
Albumin: 4.1 g/dL (ref 4.0–5.0)
Albumin: 3.5 g/dL (ref 3.5–5.0)
Alkaline Phosphatase: 103 IU/L (ref 44–121)
Alkaline Phosphatase: 92 U/L (ref 38–126)
Anion gap: 10 (ref 5–15)
BUN/Creatinine Ratio: 8 — ABNORMAL LOW (ref 9–20)
BUN: 6 mg/dL (ref 6–20)
BUN: 7 mg/dL (ref 6–20)
Bilirubin Total: 0.3 mg/dL (ref 0.0–1.2)
CO2: 20 mmol/L (ref 20–29)
CO2: 25 mmol/L (ref 22–32)
Calcium: 9.2 mg/dL (ref 8.7–10.2)
Calcium: 9.1 mg/dL (ref 8.9–10.3)
Chloride: 96 mmol/L (ref 96–106)
Chloride: 98 mmol/L (ref 98–111)
Creatinine, Ser: 0.8 mg/dL (ref 0.76–1.27)
Creatinine, Ser: 0.84 mg/dL (ref 0.61–1.24)
GFR, Estimated: >60 mL/min (ref >60)
Globulin, Total: 3.5 g/dL (ref 1.5–4.5)
Glucose, Bld: 369 mg/dL — ABNORMAL HIGH (ref 70–99)
Glucose: 304 mg/dL — ABNORMAL HIGH (ref 65–99)
Potassium: 3.8 mmol/L (ref 3.5–5.2)
Potassium: 3.5 mmol/L (ref 3.5–5.1)
Sodium: 133 mmol/L — ABNORMAL LOW (ref 134–144)
Sodium: 133 mmol/L — ABNORMAL LOW (ref 135–145)
Total Bilirubin: 0.3 mg/dL (ref 0.3–1.2)
Total Protein: 7.6 g/dL (ref 6.0–8.5)
Total Protein: 8.2 g/dL — ABNORMAL HIGH (ref 6.5–8.1)
eGFR: 115 mL/min/{1.73_m2} (ref >59)

## 2021-02-25 LAB — URINALYSIS, ROUTINE W REFLEX MICROSCOPIC
Bacteria, UA: NONE SEEN
Bilirubin Urine: NEGATIVE
Glucose, UA: >=500 mg/dL — AB
Ketones, ur: NEGATIVE mg/dL
Leukocytes,Ua: NEGATIVE
Nitrite: NEGATIVE
Protein, ur: 30 mg/dL — AB
Specific Gravity, Urine: 1.039 — ABNORMAL HIGH (ref 1.005–1.030)
pH: 6 (ref 5.0–8.0)

## 2021-02-25 LAB — LIPASE, BLOOD: Lipase: 32 U/L (ref 11–51)

## 2021-02-25 LAB — COVID-19, FLU A+B NAA
Influenza A, NAA: NOT DETECTED
Influenza B, NAA: NOT DETECTED
SARS-CoV-2, NAA: NOT DETECTED

## 2021-02-25 MED ORDER — METRONIDAZOLE 500 MG PO TABS: 500.0000 mg | ORAL_TABLET | Freq: Two times a day (BID) | ORAL | 0 refills | Status: DC

## 2021-02-25 MED ORDER — CIPROFLOXACIN HCL 250 MG PO TABS
500.0000 mg | ORAL_TABLET | Freq: Once | ORAL | Status: AC
  Administered 2021-02-25: 500 mg via ORAL
  Filled 2021-02-25: qty 2

## 2021-02-25 MED ORDER — CIPROFLOXACIN HCL 500 MG PO TABS: 500.0000 mg | ORAL_TABLET | Freq: Two times a day (BID) | ORAL | 0 refills | Status: DC

## 2021-02-25 MED ORDER — MORPHINE SULFATE (PF) 4 MG/ML IV SOLN
4.0000 mg | Freq: Once | INTRAVENOUS | Status: AC
  Administered 2021-02-25: 4 mg via INTRAVENOUS
  Filled 2021-02-25: qty 1

## 2021-02-25 MED ORDER — ONDANSETRON HCL 4 MG/2ML IJ SOLN: 4.0000 mg | Freq: Once | INTRAMUSCULAR | Status: DC

## 2021-02-25 MED ORDER — IOHEXOL 300 MG/ML  SOLN
100.0000 mL | Freq: Once | INTRAMUSCULAR | Status: AC | PRN
  Administered 2021-02-25: 100 mL via INTRAVENOUS

## 2021-02-25 MED ORDER — FENTANYL CITRATE (PF) 100 MCG/2ML IJ SOLN
50.0000 ug | Freq: Once | INTRAMUSCULAR | Status: DC
Start: 2021-02-25 — End: 2021-02-25

## 2021-02-25 MED ORDER — SODIUM CHLORIDE 0.9 % IV SOLN: INTRAVENOUS | Status: DC

## 2021-02-25 MED ORDER — OMEPRAZOLE 20 MG PO CPDR: 20.0000 mg | DELAYED_RELEASE_CAPSULE | Freq: Every day | ORAL | 0 refills | Status: DC | PRN

## 2021-02-25 MED ORDER — METRONIDAZOLE 500 MG PO TABS
500.0000 mg | ORAL_TABLET | Freq: Once | ORAL | Status: AC
  Administered 2021-02-25: 500 mg via ORAL
  Filled 2021-02-25: qty 1

## 2021-02-25 NOTE — Telephone Encounter (Signed)
Per Maudie Mercury, APP patient to follow up for evaluation in ER due to lab results (elevated WBC).  Reviewed with patient who verbalized understanding.  States he would prefer to go in the morning.  This RN review possible negative outcomes of delaying his care and patient verbalized understanding

## 2021-02-25 NOTE — Discharge Instructions (Signed)
As discussed, although you have elected to leave rather than be admitted, which was the recommendation, do not hesitate to return if you develop new, or concerning changes in your condition.  Otherwise, please be sure to follow-up with your surgeon on Friday.

## 2021-02-25 NOTE — ED Provider Notes (Signed)
[facility] EMERGENCY DEPARTMENT Provider Note   CSN: [id number] Arrival date & time: 02/25/21  1523     History Chief Complaint  Patient presents with  . Abdominal Pain    [name] is a 40 y.o. male.  HPI Patient with history of diabetes, ventral hernia repair 2 months ago now presents with abdominal pain, diarrhea, fever, myalgia.  Patient notes that he had been covering well, though he did have necessity of drain placement following the surgery.  That was removed 6 days ago.  He notes that over the past 3 to 4 days he has developed diffuse abdominal pain, moderate, persistent, with associated diarrhea, anorexia.  Temperature 103 at home.  Yesterday he went to urgent care, and today with ongoing pain, as well as abnormal lab results from urgent care yesterday he presents for evaluation.    Past Medical History:  Diagnosis Date  . Allergy   . Anxiety   . 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 Bipolar disorder ([place])    On disability  . Depression   . Diabetes mellitus without complication ([place])    per patient : under control with diet, does not monitor cbg at home   . GERD (gastroesophageal reflux disease)   . Headache(784.0)    daily headaches  . Hyperlipidemia   . Neuromuscular disorder ([place])    Beckers muscular dystrophy  . Obesity   . PTSD (post-traumatic stress disorder)   . Sleep apnea    Does not tolerate CPAP    Patient Active Problem List   Diagnosis Date Noted  . Insomnia 02/06/2021  . Incisional hernia 01/04/2021  . Ventral hernia 11/21/2020  . Anxiety   . Shoulder injury, subsequent encounter 06/29/2020  . High priority for COVID-19 virus vaccination 03/29/2020  . Type 2 diabetes mellitus with hyperglycemia ([place]) 03/01/2020  . Abdominal pain 11/22/2018  . Memory difficulty 10/04/2018  . Erectile dysfunction 01/29/2018  . Neck pain 11/12/2016  . Back pain 10/25/2016  . Allergic rhinitis 04/28/2016  . GERD (gastroesophageal reflux disease)  03/21/2013  . Elevated BP 04/25/2012  . Narcotic abuse ([place]) 06/05/2011  . OBESITY 10/17/2010  . 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 SLEEP APNEA 01/24/2009  . BIPOLAR DISORDER UNSPECIFIED 05/05/2008  . HYPERLIPIDEMIA 03/30/2008  . ABUSE, ALCOHOL, UNSPECIFIED 04/26/2007  . TOBACCO ABUSE 12/25/2006    Past Surgical History:  Procedure Laterality Date  . APPLICATION OF WOUND VAC N/A 11/23/2018   Procedure: Application Of Wound Vac;  Surgeon: Kinsinger, Arta Bruce, MD;  Location: [place];  Service: General;  Laterality: N/A;  . COLECTOMY N/A 11/23/2018   Procedure: TOTAL COLECTOMY;  Surgeon: Kieth Brightly Arta Bruce, MD;  Location: [place];  Service: General;  Laterality: N/A;  . ILEOSTOMY N/A 11/23/2018   Procedure: ILEOSTOMY;  Surgeon: Kinsinger, Arta Bruce, MD;  Location: [place];  Service: General;  Laterality: N/A;  . ILEOSTOMY CLOSURE N/A 05/19/2019   Procedure: ILEOSTOMY REVERSAL WITH ILEOCOLIC AND COLORECTAL ANASTOMOSIS;  Surgeon: Kieth Brightly Arta Bruce, MD;  Location: [facility];  Service: General;  Laterality: N/A;  . INCISIONAL HERNIA REPAIR N/A 01/04/2021   Procedure: LAPAROSCOPIC INCISIONAL HERNIA REPAIR WITH MESH; LYSIS OF ADHESIONS;  Surgeon: Kieth Brightly Arta Bruce, MD;  Location: [facility];  Service: General;  Laterality: N/A;  . IR GUIDED DRAIN W CATHETER PLACEMENT  02/14/2021  . IR RADIOLOGIST EVAL & MGMT  02/19/2021  . NM MYOCAR PERF WALL MOTION  01/22/2011   protocol: Persantine, moderate reversible inferior defect post stress EF 48%, high risk  scan  . TONSILLECTOMY    . TRANSTHORACIC ECHOCARDIOGRAM  07/05/2004   EF=>55% normal study        Family History  Problem Relation Age of Onset  . COPD Mother   . Depression Mother   . Diabetes Mother   . Hyperlipidemia Mother   . Asthma Father   . Arthritis Father   . Diabetes Father   . Heart disease Father   . Hyperlipidemia Father   . Hypertension Father   . Hypertension Brother     Social History   Tobacco Use  . Smoking status:  Current Every Day Smoker    Packs/day: 1.50    Years: 21.00    Pack years: 31.50    Types: Cigarettes  . Smokeless tobacco: Never Used  Vaping Use  . Vaping Use: Former  . Devices: Increased nicotine cravings  Substance Use Topics  . Alcohol use: No    Alcohol/week: 0.0 standard drinks    Comment: quit 2013  . Drug use: Not Currently    Types: Marijuana    Comment: 7-27    Home Medications Prior to Admission medications   Medication Sig Start Date End Date Taking? Authorizing Provider  ciprofloxacin (CIPRO) 500 MG tablet Take 1 tablet (500 mg total) by mouth 2 (two) times daily. 02/25/21  Yes Carmin Muskrat, MD  diphenhydramine-acetaminophen (TYLENOL PM) 25-500 MG TABS tablet Take 3 tablets by mouth at bedtime as needed (Sleep).   Yes [provider]  gabapentin (NEURONTIN) 300 MG capsule Take 300 mg by mouth daily as needed (pain). 06/20/16  Yes [provider]  LANTUS SOLOSTAR 100 UNIT/ML Solostar Pen INJECT 40 UNITS INTO THE SKIN EVERY MORNING AS DIRECTED Patient taking differently: Inject 40 Units into the skin daily. 02/14/21  Yes Anderson, Chelsey L, DO  lurasidone (LATUDA) 40 MG TABS tablet Take 1 tablet (40 mg total) by mouth at bedtime. 01/04/21  Yes Kinsinger, Arta Bruce, MD  metroNIDAZOLE (FLAGYL) 500 MG tablet Take 1 tablet (500 mg total) by mouth 2 (two) times daily. 02/25/21  Yes Carmin Muskrat, MD  omeprazole (PRILOSEC) 20 MG capsule Take 1 capsule (20 mg total) by mouth daily as needed. 02/25/21  Yes Anderson, Chelsey L, DO  promethazine (PHENERGAN) 50 MG tablet Take 1 tablet (50 mg total) by mouth every 6 (six) hours as needed for nausea or vomiting. 02/06/21  Yes Anderson, Chelsey L, DO  traZODone (DESYREL) 100 MG tablet Take 50-100 mg by mouth at bedtime. 12/05/19  Yes [provider]  VYVANSE 20 MG capsule Take 20 mg by mouth daily with breakfast. Take with 50 mg for a total of 70 mg 06/28/18  Yes [provider]  VYVANSE 50 MG capsule  Take 50 mg by mouth See admin instructions. Take with 20 mg for a total of 70 mg in the morning 06/28/18  Yes [provider]  Blood Glucose Monitoring Suppl (ONE TOUCH ULTRA 2) w/Device KIT Measure blood sugar daily and with meals for insulin 01/08/21   Anderson, Chelsey L, DO  busPIRone (BUSPAR) 10 MG tablet Take 10 mg by mouth 3 (three) times daily. Patient not taking: Reported on 02/25/2021 10/25/20   [provider]  Dulaglutide (TRULICITY) 7.86 VE/7.2CN SOPN Inject 0.75 mg into the skin once a week. Patient not taking: Reported on 02/25/2021 02/06/21   Doristine Mango L, DO  ibuprofen (ADVIL) 800 MG tablet Take 1 tablet (800 mg total) by mouth every 8 (eight) hours as needed. Patient not taking: No sig reported  01/04/21   Kinsinger, Arta Bruce, MD  Insulin Pen Needle 32G X 6 MM MISC 1 packet by Does not apply route 2 (two) times daily. Patient not taking: No sig reported 03/29/20   Anderson, Chelsey L, DO  JARDIANCE 25 MG TABS tablet TAKE ONE TABLET BY MOUTH DAILY. Patient not taking: Reported on 02/25/2021 01/14/21   Doristine Mango L, DO  Lancets (ONETOUCH DELICA PLUS [id number]) [place] USE TO TEST TWO TIMES A DAY Patient not taking: No sig reported 12/21/19   Doristine Mango L, DO  Melatonin 1 MG SUBL Place 1 mg under the tongue at bedtime. Patient not taking: Reported on 02/25/2021 02/06/21   Doristine Mango L, DO  ns flush (NS FLUSH) 0.9% SOLN Flush catheter 1 to 2 times daily with 5 to 54m's of saline Patient not taking: Reported on 02/25/2021 02/14/21   MDocia Barrier PA  [facility] test strip USE TO TEST TWO TIMES A DAY 02/19/21   ADoristine MangoL, DO  oxyCODONE (OXY IR/ROXICODONE) 5 MG immediate release tablet Take 1-2 tablets (5-10 mg total) by mouth every 4 (four) hours as needed for moderate pain. Patient not taking: No sig reported 01/04/21   Kinsinger, LArta Bruce MD  oxyCODONE (ROXICODONE) 5 MG immediate release tablet Take 1 tablet (5 mg total) by mouth  every 6 (six) hours as needed for up to 12 doses for severe pain. Patient not taking: Reported on 02/25/2021 01/29/21   TWyvonnia Dusky MD    Allergies    Metformin and related, Penicillins, and Nsaids  Review of Systems   Review of Systems  Constitutional:       Per HPI, otherwise negative  HENT:       Per HPI, otherwise negative  Respiratory:       Per HPI, otherwise negative  Cardiovascular:       Per HPI, otherwise negative  Gastrointestinal: Positive for abdominal pain and diarrhea. Negative for vomiting.  Endocrine:       Negative aside from HPI  Genitourinary:       Neg aside from HPI   Musculoskeletal:       Per HPI, otherwise negative  Skin: Negative.   Neurological: Negative for syncope.    Physical Exam Updated Vital Signs BP 131/88 (BP Location: Right Arm)   Pulse (!) 114   Temp 99.8 F (37.7 C) (Oral)   Resp 20   Ht 5' 10" (1.778 m)   Wt 97.5 kg   SpO2 97%   BMI 30.85 kg/m   Physical Exam Vitals and nursing note reviewed.  Constitutional:      General: He is not in acute distress.    Appearance: He is well-developed.  HENT:     Head: Normocephalic and atraumatic.  Eyes:     Conjunctiva/sclera: Conjunctivae normal.  Cardiovascular:     Rate and Rhythm: Regular rhythm. Tachycardia present.  Pulmonary:     Effort: Pulmonary effort is normal. No respiratory distress.     Breath sounds: No stridor.  Abdominal:     Tenderness: There is generalized abdominal tenderness. There is guarding.     Comments: Prior drain placement site right upper quadrant unremarkable  Skin:    General: Skin is warm and dry.  Neurological:     Mental Status: He is alert and oriented to person, place, and time.      ED Results / Procedures / Treatments   Labs (all labs ordered are listed, but only abnormal results are displayed) Labs Reviewed  COMPREHENSIVE METABOLIC PANEL - Abnormal; Notable for the following components:      Result Value   Sodium 133 (*)     Glucose, Bld 369 (*)    Total Protein 8.2 (*)    AST 14 (*)    All other components within normal limits  CBC WITH DIFFERENTIAL/PLATELET - Abnormal; Notable for the following components:   WBC 13.7 (*)    Hemoglobin 12.4 (*)    HCT 37.8 (*)    MCV 79.9 (*)    Neutro Abs 10.0 (*)    All other components within normal limits  URINALYSIS, ROUTINE W REFLEX MICROSCOPIC - Abnormal; Notable for the following components:   Specific Gravity, Urine 1.039 (*)    Glucose, UA >=500 (*)    Hgb urine dipstick SMALL (*)    Protein, ur 30 (*)    All other components within normal limits  SARS CORONAVIRUS 2 (TAT 6-24 HRS)  LIPASE, BLOOD    EKG None  Radiology CT ABDOMEN PELVIS W CONTRAST  Result Date: 02/25/2021 CLINICAL DATA:  Recent removal of abdominal drain EXAM: CT ABDOMEN AND PELVIS WITH CONTRAST TECHNIQUE: Multidetector CT imaging of the abdomen and pelvis was performed using the standard protocol following bolus administration of intravenous contrast. CONTRAST:  122m OMNIPAQUE IOHEXOL 300 MG/ML  SOLN COMPARISON:  CT 01/29/2021, 09/26/2020 FINDINGS: Lower chest: Lung bases demonstrate no acute consolidation or effusion. Normal cardiac size. Hepatobiliary: Contracted gallbladder. No calcified stone or focal parenchymal abnormality within the liver. No biliary dilatation Pancreas: Unremarkable. No pancreatic ductal dilatation or surrounding inflammatory changes. Spleen: Normal in size without focal abnormality. Adrenals/Urinary Tract: Adrenal glands are unremarkable. Kidneys are normal, without renal calculi, focal lesion, or hydronephrosis. Bladder is unremarkable. Stomach/Bowel: The stomach is unremarkable. No dilated small bowel. Postsurgical changes of the right and sigmoid colon. No acute bowel wall thickening. Vascular/Lymphatic: Mild aortic atherosclerosis without aneurysm. No suspicious nodes Reproductive: Prostate is unremarkable. Other: Negative for pelvic effusion. Small subcutaneous cyst in  the left gluteal region. The patient is status post multiple ventral hernia repair. Large gas and fluid collection within the anterior abdominal wall measuring 14 cm transverse by 5.2 cm AP by 16.3 cm craniocaudad. Mild rim enhancement suspicious for abscess. There are 2 smaller slightly thick-walled rim enhancing fluid collections within the right abdominal wall subcutaneous soft tissues superficial to the rectus. The more cranial fluid collection appears to involve or abut the skin surface and measures 2.4 x 2.7 cm, series 2, image 40. The more caudal fluid collection at the level of the umbilicus measures 2.8 by 2.1 cm, series 2, image 55. Musculoskeletal: No acute or significant osseous findings. IMPRESSION: 1. Large gas and fluid collection within the anterior abdominal wall with associated rim enhancement, suspicious for abscess. There are 2 additional smaller slightly thick-walled rim enhancing collections within the subcutaneous soft tissues of the right abdominal wall, also suspect for abscess. Electronically Signed   By: KDonavan FoilM.D.   On: 02/25/2021 18:28     Medications Ordered in ED Medications  0.9 %  sodium chloride infusion (0 mLs Intravenous Stopped 02/25/21 2002)  iohexol (OMNIPAQUE) 300 MG/ML solution 100 mL (100 mLs Intravenous Contrast Given 02/25/21 1758)  morphine 4 MG/ML injection 4 mg (4 mg Intravenous Given 02/25/21 1759)  ciprofloxacin (CIPRO) tablet 500 mg (500 mg Oral Given 02/25/21 2011)  metroNIDAZOLE (FLAGYL) tablet 500 mg (500 mg Oral Given 02/25/21 2011)    ED Course  I have reviewed the triage vital signs and the nursing  notes.  Pertinent labs & imaging results that were available during my care of the patient were reviewed by me and considered in my medical decision making (see chart for details).   History obtained by the patient, urgent care clinician from yesterday and hospitalization notes. On repeat exam patient is awake, alert.  Initial findings notable for  leukocytosis  Update: Patient remains awake and alert, CT scan reviewed, concerning for intra-abdominal abscess, large gas and fluid collection.  8:24 PM Have discussed patient's case with his surgeon.  Recommendation from surgery, and mine is for the patient to stay for IV antibiotics, possible IR drain placement, admission.  Patient acknowledges this recommendation, states that he is unwilling to stay in the hospital, acknowledges the risks of doing so, states that he will take oral antibiotics, follow-up with his surgeon, as previously scheduled in 5 days.  Though he is found to have large intra-abdominal abscess, with leukocytosis, concern for ongoing infection, the patient has capacity to make request for discharge, and this was accommodated.  Final Clinical Impression(s) / ED Diagnoses Final diagnoses:  Postoperative intra-abdominal abscess   MDM Number of Diagnoses or Management Options Postoperative intra-abdominal abscess: established, worsening   Amount and/or Complexity of Data Reviewed Clinical lab tests: ordered and reviewed Tests in the radiology section of CPT: ordered and reviewed Tests in the medicine section of CPT: reviewed and ordered Decide to obtain previous medical records or to obtain history from someone other than the patient: yes Obtain history from someone other than the patient: yes Review and summarize past medical records: yes Discuss the patient with other providers: yes Independent visualization of images, tracings, or specimens: yes  Risk of Complications, Morbidity, and/or Mortality Presenting problems: high Diagnostic procedures: high Management options: high  Critical Care Total time providing critical care: 30-74 minutes (35)  Patient Progress Patient progress: other (comment) (Relatively stable, though w large post-op abscess)  Rx / DC Orders ED Discharge Orders         Ordered    ciprofloxacin (CIPRO) 500 MG tablet  2 times daily         02/25/21 2023    metroNIDAZOLE (FLAGYL) 500 MG tablet  2 times daily        02/25/21 2023           Carmin Muskrat, MD 02/25/21 2026

## 2021-02-25 NOTE — ED Notes (Signed)
Pt standing by bed, reports he does not want to stay in hospital or have surgery. Reports he wants to go outside and have a cigarette. EDP made aware, EDP will see patient.

## 2021-02-25 NOTE — ED Triage Notes (Signed)
States he was referred her from urgent care for evaluation of elevated white count. States he has had a fever for 3 days

## 2021-03-06 ENCOUNTER — Other Ambulatory Visit (HOSPITAL_COMMUNITY): Payer: Self-pay | Admitting: General Surgery

## 2021-03-06 ENCOUNTER — Encounter (HOSPITAL_COMMUNITY): Payer: Self-pay | Admitting: Radiology

## 2021-03-06 ENCOUNTER — Other Ambulatory Visit: Payer: Self-pay

## 2021-03-06 ENCOUNTER — Ambulatory Visit (INDEPENDENT_AMBULATORY_CARE_PROVIDER_SITE_OTHER): Payer: Medicare Other | Admitting: Family Medicine

## 2021-03-06 ENCOUNTER — Ambulatory Visit (HOSPITAL_COMMUNITY): Payer: Medicare Other | Admitting: Physical Therapy

## 2021-03-06 VITALS — BP 104/74 | HR 127 | Temp 100.0°F | Ht 70.0 in | Wt 227.0 lb

## 2021-03-06 DIAGNOSIS — L02211 Cutaneous abscess of abdominal wall: Secondary | ICD-10-CM | POA: Diagnosis not present

## 2021-03-06 DIAGNOSIS — K651 Peritoneal abscess: Secondary | ICD-10-CM

## 2021-03-06 NOTE — Progress Notes (Signed)
[name] Male, 40 y.o., 1981-02-13  MRN:  [id number] Phone:  [phone] (H) ...       PCP:  [name], DO Primary Cvg:  [place] [employer]  Next Appt With Rehabilitation 03/25/2021 at 9:00 AM           RE: US Guided Drainage of Abcess of Abdomen Received: Today Arne Cleveland, MD  [name]  Ok   CT drain resid/recurrent abd wall process   DDH        Previous Messages   ----- Message -----  From: Garth Bigness D  Sent: 03/06/2021  1:29 PM EDT  To: Ir Procedure Requests  Subject: US Guided Drainage of Abcess of Abdomen      Procedure: US Guided Drainage of Abcess of Abdomen   Reason: abcess of abdominal cavity   History: CT, IR Drain W Catheter Placement in epic   Provider: Gurney Maxin   Provider Contact: [phone]

## 2021-03-06 NOTE — Patient Instructions (Signed)
Thank you for coming to see me today. It was a pleasure. Today we talked about:   Interventional Radiology will contact you to set up the drain procedure.  If you don't hear from them by tomorrow, call the surgery office.  If you worsen at all, have chest pain, difficulty breathing, you cannot tolerate anything at all by mouth, you need to be seen in the Emergency Room right away.  Please follow-up with PCP in 1 week.  If you have any questions or concerns, please do not hesitate to call the office at [phone].  Best,   [place]zona Constable, DO

## 2021-03-06 NOTE — Assessment & Plan Note (Addendum)
Known abscess on PO antibiotics.  Follows with Dr. Kieth Brightly.  Has been tachycardic since 5/8.  Was offered a drain, but declined.  Most of his complaints are likely caused by his known infection.  Imaging reviewed personally and with Dr. McDiarmid.  Patient's abscess if rather large.  Myself and Dr. McDiarmid both spoke with patient and advised that he would likely need a drain to assess his infection.  [facility], asked for appointment in next 1-2 days.  They report that patient does not need another eval appointment, can just be set up for drain.  Patient will be contacted by IR to be scheduled for drain clinic procedure.  Continue antibiotics until then.  Patient would like to avoid ED, therefore encouraged PO hydration, but if worsening of symptoms, developing chest pain, shortness of breath, unable to tolerate any PO, needs to be seen in ED right away.

## 2021-03-06 NOTE — Progress Notes (Signed)
    SUBJECTIVE:   CHIEF COMPLAINT / HPI:   Nausea, Diarrhea Ventral hernia repair in March Currently being treated for intra-abdominal abscess noted in ED on 5/9 On Cipro and flagyl and has been compliant, he was prescribed more for 1.5 months Last saw his surgeon 5/13, was given oxycodone and more antibiotics Has another appointment in 2 months Per patient, surgeon said he will be like this for a few months, but was told he will get better He has not been eating because of nausea, only eats a few bites here and there Is drinking well, urinating normally He has been having diarrhea since surgery, worsened with antibiotics Has not tried anything for diarrhea He feels like he has more gas  Based gas this morning and it helped his abdominal pain No vomiting, just severe nausea and dry heaving Has been having temp to 103 last few weeks, last night he was 100.3 Surgeon told him this was normal He has had a drain, but not currently, and was removed prior to ED visit on 5/9, was asked if wanted another and he said no Follows with Dr. Kieth Brightly  Tachycardia Also noted at hospital on 5/9 Has been tachycardic on checks since 5/8 Hgb on 5/8 13.3, on 5/9 was 12.4 Last night he had some feelings of palpitations, but this is the only time this happened He is still having that feeling He has dizziness upon standing He fell on the floor a few nights ago, hit his hip, not his head, no LOC  PERTINENT  PMH / PSH: GERD, T2DM, HLD, Bipolar Disorder, Tobacco Abuse, Hx Narcotic Abuse  OBJECTIVE:   BP 104/74   Pulse (!) 127   Temp 100 F (37.8 C)   Ht $R'5\' 10"'RF$  (1.778 m)   Wt 227 lb (103 kg)   SpO2 93%   BMI 32.57 kg/m    Repeat pulse 116 Temp 100.0  Physical Exam:  General: 40 y.o. male in NAD Cardio: tachycardic, no murmur Lungs: CTAB, no wheezing, no rhonchi, no crackles, no IWOB on RA Abdomen: tense right upper to mid abdomen with TTP, well healed midline incision with RUQ incision  also well-healed Skin: warm and dry Extremities: No edema   ASSESSMENT/PLAN:   Abdominal wall abscess Known abscess on PO antibiotics.  Follows with Dr. Kieth Brightly.  Has been tachycardic since 5/8.  Was offered a drain, but declined.  Most of his complaints are likely caused by his known infection.  Imaging reviewed personally and with Dr. McDiarmid.  Patient's abscess if rather large.  Myself and Dr. McDiarmid both spoke with patient and advised that he would likely need a drain to assess his infection.  [facility], asked for appointment in next 1-2 days.  They report that patient does not need another eval appointment, can just be set up for drain.  Patient will be contacted by IR to be scheduled for drain clinic procedure.  Continue antibiotics until then.  Patient would like to avoid ED, therefore encouraged PO hydration, but if worsening of symptoms, developing chest pain, shortness of breath, unable to tolerate any PO, needs to be seen in ED right away.     Cleophas Dunker, [place]

## 2021-03-07 ENCOUNTER — Ambulatory Visit: Payer: Medicare Other | Admitting: Family Medicine

## 2021-03-13 ENCOUNTER — Telehealth (HOSPITAL_COMMUNITY): Payer: Medicare Other | Admitting: *Deleted

## 2021-03-13 ENCOUNTER — Other Ambulatory Visit: Payer: Self-pay | Admitting: Student

## 2021-03-14 ENCOUNTER — Ambulatory Visit (HOSPITAL_COMMUNITY)
Admission: RE | Admit: 2021-03-14 | Discharge: 2021-03-14 | Disposition: A | Discharge status: Home / Self Care | Payer: Medicare Other | Source: Ambulatory Visit | Attending: General Surgery | Admitting: General Surgery

## 2021-03-14 ENCOUNTER — Other Ambulatory Visit: Payer: Self-pay

## 2021-03-14 ENCOUNTER — Other Ambulatory Visit: Payer: Self-pay | Admitting: General Surgery

## 2021-03-14 ENCOUNTER — Encounter (HOSPITAL_COMMUNITY): Payer: Self-pay

## 2021-03-14 VITALS — BP 127/83 | HR 133 | Temp 102.8°F | Resp 18 | Ht 67.0 in | Wt 223.0 lb

## 2021-03-14 DIAGNOSIS — K651 Peritoneal abscess: Secondary | ICD-10-CM | POA: Diagnosis not present

## 2021-03-14 DIAGNOSIS — G7101 Duchenne or Becker muscular dystrophy: Secondary | ICD-10-CM | POA: Diagnosis not present

## 2021-03-14 DIAGNOSIS — G4733 Obstructive sleep apnea (adult) (pediatric): Secondary | ICD-10-CM | POA: Diagnosis not present

## 2021-03-14 DIAGNOSIS — Z833 Family history of diabetes mellitus: Secondary | ICD-10-CM | POA: Diagnosis not present

## 2021-03-14 DIAGNOSIS — L02211 Cutaneous abscess of abdominal wall: Secondary | ICD-10-CM | POA: Diagnosis not present

## 2021-03-14 DIAGNOSIS — F17210 Nicotine dependence, cigarettes, uncomplicated: Secondary | ICD-10-CM | POA: Insufficient documentation

## 2021-03-14 DIAGNOSIS — K219 Gastro-esophageal reflux disease without esophagitis: Secondary | ICD-10-CM | POA: Diagnosis not present

## 2021-03-14 DIAGNOSIS — Z79899 Other long term (current) drug therapy: Secondary | ICD-10-CM | POA: Insufficient documentation

## 2021-03-14 DIAGNOSIS — E119 Type 2 diabetes mellitus without complications: Secondary | ICD-10-CM | POA: Diagnosis not present

## 2021-03-14 DIAGNOSIS — Z9114 Patient's other noncompliance with medication regimen: Secondary | ICD-10-CM | POA: Diagnosis not present

## 2021-03-14 DIAGNOSIS — T8143XA Infection following a procedure, organ and space surgical site, initial encounter: Secondary | ICD-10-CM | POA: Diagnosis not present

## 2021-03-14 LAB — CBC WITH DIFFERENTIAL/PLATELET
Abs Immature Granulocytes: 0.06 10*3/uL (ref 0.00–0.07)
Basophils Absolute: 0 10*3/uL (ref 0.0–0.1)
Basophils Relative: 0 %
Eosinophils Absolute: 0.1 10*3/uL (ref 0.0–0.5)
Eosinophils Relative: 1 %
HCT: 39.6 % (ref 39.0–52.0)
Hemoglobin: 12.7 g/dL — ABNORMAL LOW (ref 13.0–17.0)
Immature Granulocytes: 0 %
Lymphocytes Relative: 7 %
Lymphs Abs: 1.1 10*3/uL (ref 0.7–4.0)
MCH: 24.4 pg — ABNORMAL LOW (ref 26.0–34.0)
MCHC: 32.1 g/dL (ref 30.0–36.0)
MCV: 76 fL — ABNORMAL LOW (ref 80.0–100.0)
Monocytes Absolute: 1.1 10*3/uL — ABNORMAL HIGH (ref 0.1–1.0)
Monocytes Relative: 7 %
Neutro Abs: 12.7 10*3/uL — ABNORMAL HIGH (ref 1.7–7.7)
Neutrophils Relative %: 85 %
Platelets: 424 10*3/uL — ABNORMAL HIGH (ref 150–400)
RBC: 5.21 MIL/uL (ref 4.22–5.81)
RDW: 15 % (ref 11.5–15.5)
WBC: 15.1 10*3/uL — ABNORMAL HIGH (ref 4.0–10.5)
nRBC: 0 % (ref 0.0–0.2)

## 2021-03-14 LAB — GLUCOSE, CAPILLARY: Glucose-Capillary: 390 mg/dL — ABNORMAL HIGH (ref 70–99)

## 2021-03-14 MED ORDER — SODIUM CHLORIDE 0.9 % IV SOLN: INTRAVENOUS | Status: DC

## 2021-03-14 MED ORDER — FENTANYL CITRATE (PF) 100 MCG/2ML IJ SOLN
INTRAMUSCULAR | Status: AC
  Filled 2021-03-14: qty 4

## 2021-03-14 MED ORDER — MIDAZOLAM HCL 2 MG/2ML IJ SOLN
INTRAMUSCULAR | Status: AC | PRN
  Administered 2021-03-14 (×2): 1 mg via INTRAVENOUS

## 2021-03-14 MED ORDER — LIDOCAINE HCL (PF) 1 % IJ SOLN
INTRAMUSCULAR | Status: AC | PRN
Start: 2021-03-14 — End: 2021-03-14
  Administered 2021-03-14: 10 mL

## 2021-03-14 MED ORDER — VANCOMYCIN HCL IN DEXTROSE 1-5 GM/200ML-% IV SOLN: 1000.0000 mg | Freq: Once | INTRAVENOUS | Status: AC

## 2021-03-14 MED ORDER — VANCOMYCIN HCL IN DEXTROSE 1-5 GM/200ML-% IV SOLN
INTRAVENOUS | Status: AC
  Administered 2021-03-14: 1000 mg via INTRAVENOUS
  Filled 2021-03-14: qty 200

## 2021-03-14 MED ORDER — FENTANYL CITRATE (PF) 100 MCG/2ML IJ SOLN
INTRAMUSCULAR | Status: AC | PRN
  Administered 2021-03-14: 50 ug via INTRAVENOUS

## 2021-03-14 MED ORDER — DIPHENHYDRAMINE HCL 50 MG/ML IJ SOLN
INTRAMUSCULAR | Status: AC
  Filled 2021-03-14: qty 1

## 2021-03-14 MED ORDER — MIDAZOLAM HCL 2 MG/2ML IJ SOLN
INTRAMUSCULAR | Status: AC
  Filled 2021-03-14: qty 4

## 2021-03-14 MED ORDER — DIPHENHYDRAMINE HCL 50 MG/ML IJ SOLN
INTRAMUSCULAR | Status: AC | PRN
  Administered 2021-03-14: 25 mg via INTRAVENOUS

## 2021-03-14 MED ORDER — SODIUM CHLORIDE 0.9% FLUSH: 5.0000 mL | Freq: Three times a day (TID) | INTRAVENOUS | Status: DC

## 2021-03-14 NOTE — Progress Notes (Signed)
IR.  Patient underwent an image-guided intra-abdominal fluid collection aspiration/drain placement today in IR.  Patient febrile, now tachycardic following procedure. CBC with leukocytosis (15.1). Chart check revealed patient had Ciprofloxacin (500 mg tablets taken twice daily for 7 days) sent to pharmacy by Dr. Vanita Panda ([place]) 02/25/2021, however he never picked up this medication. Dr. Pascal Lux recommending patient take this antibiotic secondary to above.  Discussed with patient. He states he never picked up medication as he did not have transpiration at that time. Counseled patient on importance of picking up/taking this antibiotic starting today- patient agrees to this. Patient also agrees to have close follow-up with Dr. Kieth Brightly ([facility]). Maudie Mercury, RN aware of above.  Please call IR with questions/concerns.   Bea Graff Deionna Marcantonio, PA-C 03/14/2021, 11:11 AM

## 2021-03-14 NOTE — Discharge Instructions (Signed)
Urgent needs - Interventional Radiology on call MD [phone]  Wound - May remove dressing in 48 hours and clean drain site as direced by your care provider.  Keep site clean and dry.  Do not submerge site in tub or water.    Flush drain daily with 5ML (cc)  Sterile Normal Saline 3x per day (as directed by your provider). Empty drain daily (and possibly 2 to 3 times per day as needed to keep from overfilling). Record drainage amount daily to have report for care provider.  Monitor Blood Glucose levels and follow instructions regarding medicating as directed by your provider;  as maintaining a healthy Blood Glucose (sugar) level will lend to healing and over all good health.   Stay hydrated with water.  Percutaneous Abscess Drain Placement, Care After This sheet gives you information about how to care for yourself after your procedure. Your health care provider may also give you more specific instructions. If you have problems or questions, contact your health care provider. What can I expect after the procedure? After the procedure, it is common to have:  A small amount of bruising and discomfort in the area where the drainage tube (catheter) was placed.  Sleepiness and fatigue. This should go away after the medicines you were given have worn off. Follow these instructions at home: Incision care  Follow instructions from your health care provider about how to take care of your incision. Make sure you: ? Wash your hands with soap and water for at least 20 seconds before and after you change your bandage (dressing). If soap and water are not available, use hand sanitizer. ? Change your dressing as told by your health care provider. (above) ? Leave stitches (sutures), skin glue, or adhesive strips in place. These skin closures may need to stay in place for 2 weeks or longer. If adhesive strip edges start to loosen and curl up, you may trim the loose edges. Do not remove adhesive strips  completely unless your health care provider tells you to do that.  Check your incision area every day for signs of infection. Check for: ? More redness, swelling, or pain. ? More fluid or blood. ? Warmth. ? Pus or a bad smell.   Catheter care  Follow instructions from your health care provider about emptying and cleaning your catheter and collection bag or drainage bulb. You may need to clean the catheter every day so it does not clog.  If directed, write down the following information every time you empty your bag: ? The date and time. ? The amount of drainage.  Check for fluid leaking from around your catheter (instead of fluid draining through your catheter). This may be a sign that the drain is no longer working correctly. Activity  Rest at home for 1-2 days after your procedure.  Return to your normal activities as told by your health care provider. Ask your health care provider what activities are safe for you.  If you were given a sedative during the procedure, it can affect you for several hours. Do not drive or operate machinery until your health care provider says that it is safe. General instructions  Take over-the-counter and prescription medicines only as told by your health care provider.  If you were prescribed an antibiotic medicine, take it as told by your health care provider. Do not stop using the antibiotic even if you start to feel better.  Do not take showers, take baths, swim, or use a hot tub  for 24 hours after your procedure or until your health care provider says that this is okay.  Do not use any products that contain nicotine or tobacco, such as cigarettes, e-cigarettes, and chewing tobacco. If you need help quitting, ask your health care provider.  Keep all follow-up visits as told by your health care provider. This is important. Contact a health care provider if:  You have less than 10 mL of drainage a day for 2-3 days in a row, or as directed by your  health care provider.  You have any of these signs of infection: ? More redness, swelling, or pain around your incision area. ? More fluid or blood coming from your incision area. ? Warmth coming from your incision area. ? Pus or a bad smell coming from your incision area.  You have fluid leaking from around your catheter (instead of through your catheter).  You have a fever or chills.  You have pain that does not get better with medicine. Get help right away if:  Your catheter comes out.  You suddenly stop having drainage from your catheter.  You suddenly have blood in the fluid that is draining from your catheter.  You become dizzy or you faint.  You develop a rash.  You have nausea or vomiting.  You have difficulty breathing or you feel short of breath.  You develop chest pain.  You have problems with your speech or vision.  You have trouble balancing or moving your arms or legs. Summary  It is common to have a small amount of bruising and discomfort in the area where the drainage tube (catheter) was placed.  Follow instructions from your health care provider about emptying and cleaning your catheter and collection bag or drainage bulb.  You may be directed to record the amount of drainage from the bag every time you empty it.  Contact a health care provider if you have more redness, swelling, or pain around your incision area or if you have pain that does not get better with medicine. This information is not intended to replace advice given to you by your health care provider. Make sure you discuss any questions you have with your health care provider. Document Revised: 10/01/2019 Document Reviewed: 10/01/2019 Elsevier Patient Education  2021 [employer].   Moderate Conscious Sedation, Adult, Care After This sheet gives you information about how to care for yourself after your procedure. Your health care provider may also give you more specific instructions. If  you have problems or questions, contact your health care provider. What can I expect after the procedure? After the procedure, it is common to have:  Sleepiness for several hours.  Impaired judgment for several hours.  Difficulty with balance.  Vomiting if you eat too soon. Follow these instructions at home: For the time period you were told by your health care provider:  Rest.  Do not participate in activities where you could fall or become injured.  Do not drive or use machinery.  Do not drink alcohol.  Do not take sleeping pills or medicines that cause drowsiness.  Do not make important decisions or sign legal documents.  Do not take care of children on your own.      Eating and drinking  Follow the diet recommended by your health care provider.  Drink enough fluid to keep your urine pale yellow.  If you vomit: ? Drink water, juice, or soup when you can drink without vomiting. ? Make sure you have little  or no nausea before eating solid foods.   General instructions  Take over-the-counter and prescription medicines only as told by your health care provider.  Have a responsible adult stay with you for the time you are told. It is important to have someone help care for you until you are awake and alert.  Do not smoke.  Keep all follow-up visits as told by your health care provider. This is important. Contact a health care provider if:  You are still sleepy or having trouble with balance after 24 hours.  You feel light-headed.  You keep feeling nauseous or you keep vomiting.  You develop a rash.  You have a fever.  You have redness or swelling around the IV site. Get help right away if:  You have trouble breathing.  You have new-onset confusion at home. Summary  After the procedure, it is common to feel sleepy, have impaired judgment, or feel nauseous if you eat too soon.  Rest after you get home. Know the things you should not do after the  procedure.  Follow the diet recommended by your health care provider and drink enough fluid to keep your urine pale yellow.  Get help right away if you have trouble breathing or new-onset confusion at home. This information is not intended to replace advice given to you by your health care provider. Make sure you discuss any questions you have with your health care provider. Document Revised: 02/03/2020 Document Reviewed: 09/01/2019 Elsevier Patient Education  2021 [employer].

## 2021-03-14 NOTE — H&P (Addendum)
Chief Complaint: Patient was seen in consultation today for intra-abdominal fluid collection/aspiration and drainage.  Referring Physician(s): Mickeal Skinner ([place])  Supervising Physician: Sandi Mariscal  Patient Status: [facility] - Out-pt  History of Present Illness: [name] is a 40 y.o. male with a past medical history of hyperlipidemia, headaches, Becker's muscular dystrophy, GERD, diabetes mellitus, obesity, OSA not on CPAP, PTSD, anxiety, and depression. On 01/04/2021, patient underwent a  laparoscopic incisional hernia repair with mesh/lysis of adhesions in OR 01/04/2021 by Dr. Kieth Brightly. This was complicated by development of intra-abdominal fluid collection and patient underwent an image-guided intra-abdominal drain placement in IR 02/14/2021. This drain was subsequently removed in IR 02/19/2021 secondary to low output. Follow-up imaging revealed re-accumulation of intra-abdominal fluid collection.  CT abdomen/pelvis 02/25/2021: 1. Large gas and fluid collection within the anterior abdominal wall with associated rim enhancement, suspicious for abscess. There are 2 additional smaller slightly thick-walled rim enhancing collections within the subcutaneous soft tissues of the right abdominal wall, also suspect for abscess.  IR consulted by Dr. Kieth Brightly for possible image-guided intra-abdominal fluid collection aspiration/drain placement. Patient awake and alert laying in bed. Complains of fevers secondary to intra-abdominal fluid collection. Complains of abdominal pain, rated 10/10 at this time. Has associated nausea with abdominal pain, denies vomiting. Complains of mild headaches, chronic, stable. Denies chills, chest pain, or dyspnea.   Past Medical History:  Diagnosis Date  . Allergy   . Anxiety   . Becker's muscular dystrophy ([place])   . Bipolar disorder ([place])    On disability  . Depression   . Diabetes mellitus without complication ([place])    per patient : under control with  diet, does not monitor cbg at home   . GERD (gastroesophageal reflux disease)   . Headache(784.0)    daily headaches  . Hyperlipidemia   . Neuromuscular disorder ([place])    Beckers muscular dystrophy  . Obesity   . PTSD (post-traumatic stress disorder)   . Sleep apnea    Does not tolerate CPAP    Past Surgical History:  Procedure Laterality Date  . APPLICATION OF WOUND VAC N/A 11/23/2018   Procedure: Application Of Wound Vac;  Surgeon: Kinsinger, Arta Bruce, MD;  Location: [place];  Service: General;  Laterality: N/A;  . COLECTOMY N/A 11/23/2018   Procedure: TOTAL COLECTOMY;  Surgeon: Kieth Brightly Arta Bruce, MD;  Location: [place];  Service: General;  Laterality: N/A;  . ILEOSTOMY N/A 11/23/2018   Procedure: ILEOSTOMY;  Surgeon: Kinsinger, Arta Bruce, MD;  Location: [place];  Service: General;  Laterality: N/A;  . ILEOSTOMY CLOSURE N/A 05/19/2019   Procedure: ILEOSTOMY REVERSAL WITH ILEOCOLIC AND COLORECTAL ANASTOMOSIS;  Surgeon: Kieth Brightly Arta Bruce, MD;  Location: [facility];  Service: General;  Laterality: N/A;  . INCISIONAL HERNIA REPAIR N/A 01/04/2021   Procedure: LAPAROSCOPIC INCISIONAL HERNIA REPAIR WITH MESH; LYSIS OF ADHESIONS;  Surgeon: Kieth Brightly Arta Bruce, MD;  Location: [facility];  Service: General;  Laterality: N/A;  . IR GUIDED DRAIN W CATHETER PLACEMENT  02/14/2021  . IR RADIOLOGIST EVAL & MGMT  02/19/2021  . NM MYOCAR PERF WALL MOTION  01/22/2011   protocol: Persantine, moderate reversible inferior defect post stress EF 48%, high risk scan  . TONSILLECTOMY    . TRANSTHORACIC ECHOCARDIOGRAM  07/05/2004   EF=>55% normal study     Allergies: Metformin and related, Penicillins, and Nsaids  Medications: Prior to Admission medications   Medication Sig Start Date End Date Taking? Authorizing Provider  Blood Glucose Monitoring Suppl (ONE TOUCH ULTRA  2) w/Device KIT Measure blood sugar daily and with meals for insulin 01/08/21  Yes Anderson, Chelsey L, DO  diphenhydramine-acetaminophen  (TYLENOL PM) 25-500 MG TABS tablet Take 3 tablets by mouth at bedtime as needed (Sleep).   Yes [provider]  gabapentin (NEURONTIN) 300 MG capsule Take 300 mg by mouth daily as needed (pain). 06/20/16  Yes [provider]  ibuprofen (ADVIL) 800 MG tablet Take 1 tablet (800 mg total) by mouth every 8 (eight) hours as needed. 01/04/21  Yes Kinsinger, Arta Bruce, MD  Lancets [facility] DELICA PLUS [id number]) MISC USE TO TEST TWO TIMES A DAY 12/21/19  Yes Anderson, Chelsey L, DO  lurasidone (LATUDA) 40 MG TABS tablet Take 1 tablet (40 mg total) by mouth at bedtime. 01/04/21  Yes Kinsinger, Arta Bruce, MD  Melatonin 1 MG SUBL Place 1 mg under the tongue at bedtime. 02/06/21  Yes Anderson, Chelsey L, DO  omeprazole (PRILOSEC) 20 MG capsule Take 1 capsule (20 mg total) by mouth daily as needed. 02/25/21  Yes Anderson, Chelsey L, DO  ONETOUCH ULTRA test strip USE TO TEST TWO TIMES A DAY 02/19/21  Yes Anderson, Chelsey L, DO  oxyCODONE (OXY IR/ROXICODONE) 5 MG immediate release tablet Take 1-2 tablets (5-10 mg total) by mouth every 4 (four) hours as needed for moderate pain. 01/04/21  Yes Kinsinger, Arta Bruce, MD  oxyCODONE (ROXICODONE) 5 MG immediate release tablet Take 1 tablet (5 mg total) by mouth every 6 (six) hours as needed for up to 12 doses for severe pain. 01/29/21  Yes Trifan, Carola Rhine, MD  promethazine (PHENERGAN) 50 MG tablet Take 1 tablet (50 mg total) by mouth every 6 (six) hours as needed for nausea or vomiting. 02/06/21  Yes Anderson, Chelsey L, DO  traZODone (DESYREL) 100 MG tablet Take 50-100 mg by mouth at bedtime. 12/05/19  Yes [provider]  VYVANSE 20 MG capsule Take 20 mg by mouth daily with breakfast. Take with 50 mg for a total of 70 mg 06/28/18  Yes [provider]  VYVANSE 50 MG capsule Take 50 mg by mouth See admin instructions. Take with 20 mg for a total of 70 mg in the morning 06/28/18  Yes [provider]  busPIRone (BUSPAR) 10 MG tablet Take 10  mg by mouth 3 (three) times daily. Patient not taking: No sig reported 10/25/20   [provider]  ciprofloxacin (CIPRO) 500 MG tablet Take 1 tablet (500 mg total) by mouth 2 (two) times daily. 02/25/21   Carmin Muskrat, MD  Dulaglutide (TRULICITY) 0.24 OX/7.3ZH SOPN Inject 0.75 mg into the skin once a week. Patient not taking: Reported on 02/25/2021 02/06/21   Doristine Mango L, DO  Insulin Pen Needle 32G X 6 MM MISC 1 packet by Does not apply route 2 (two) times daily. Patient not taking: No sig reported 03/29/20   Anderson, Chelsey L, DO  JARDIANCE 25 MG TABS tablet TAKE ONE TABLET BY MOUTH DAILY. Patient not taking: Reported on 02/25/2021 01/14/21   Doristine Mango L, DO  LANTUS SOLOSTAR 100 UNIT/ML Solostar Pen INJECT 40 UNITS INTO THE SKIN EVERY MORNING AS DIRECTED Patient not taking: Reported on 03/14/2021 02/14/21   Doristine Mango L, DO  metroNIDAZOLE (FLAGYL) 500 MG tablet Take 1 tablet (500 mg total) by mouth 2 (two) times daily. 02/25/21   Carmin Muskrat, MD  ns flush (NS FLUSH) 0.9% SOLN Flush catheter 1 to 2 times daily with 5 to 32ml's of saline Patient not taking: No sig reported 02/14/21  Docia Barrier, PA     Family History  Problem Relation Age of Onset  . COPD Mother   . Depression Mother   . Diabetes Mother   . Hyperlipidemia Mother   . Asthma Father   . Arthritis Father   . Diabetes Father   . Heart disease Father   . Hyperlipidemia Father   . Hypertension Father   . Hypertension Brother     Social History   Socioeconomic History  . Marital status: Married    Spouse name: Not on file  . Number of children: 2  . Years of education: 28  . Highest education level: Not on file  Occupational History    Employer: UNEMPLOYED  Tobacco Use  . Smoking status: Current Every Day Smoker    Packs/day: 1.50    Years: 21.00    Pack years: 31.50    Types: Cigarettes  . Smokeless tobacco: Never Used  Vaping Use  . Vaping Use: Former  . Devices:  Increased nicotine cravings  Substance and Sexual Activity  . Alcohol use: No    Alcohol/week: 0.0 standard drinks    Comment: quit 2013  . Drug use: Not Currently    Types: Marijuana    Comment: 7-27  . Sexual activity: Yes    Birth control/protection: None  Other Topics Concern  . Not on file  Social History Narrative   Patient lives at home with parents, brother and brothers wife.    Patient is single.    Patient has 2 children.    Patient has a high school education.    Patient is unemployed.    Patient is right handed.    Social Determinants of Health   Financial Resource Strain: Not on file  Food Insecurity: Not on file  Transportation Needs: Not on file  Physical Activity: Not on file  Stress: Not on file  Social Connections: Not on file     Review of Systems: A 12 point ROS discussed and pertinent positives are indicated in the HPI above.  All other systems are negative.  Review of Systems  Constitutional: Positive for fever. Negative for chills.  Respiratory: Negative for shortness of breath and wheezing.   Cardiovascular: Negative for chest pain and palpitations.  Gastrointestinal: Positive for abdominal pain. Negative for vomiting.  Neurological: Positive for headaches.  Psychiatric/Behavioral: Negative for behavioral problems and confusion.    Vital Signs: BP (!) 141/79   Pulse (!) 137   Temp (!) 101.1 F (38.4 C) (Oral)   Resp (!) 22   Ht $R'5\' 7"'ac$  (1.702 m)   Wt 223 lb (101.2 kg)   SpO2 92%   BMI 34.93 kg/m   Physical Exam Vitals and nursing note reviewed.  Constitutional:      General: He is not in acute distress.    Appearance: He is obese.  Cardiovascular:     Rate and Rhythm: Normal rate and regular rhythm.     Heart sounds: Normal heart sounds. No murmur heard.   Pulmonary:     Effort: Pulmonary effort is normal. No respiratory distress.     Breath sounds: Normal breath sounds. No wheezing.  Abdominal:     Palpations: Abdomen is soft.      Tenderness: There is abdominal tenderness. There is no guarding.  Skin:    General: Skin is warm and dry.  Neurological:     Mental Status: He is alert and oriented to person, place, and time.      MD Evaluation  Airway: WNL Heart: WNL Abdomen: WNL Chest/ Lungs: WNL ASA  Classification: 3 Mallampati/Airway Score: Two   Imaging: CT ABDOMEN PELVIS W CONTRAST  Result Date: 02/25/2021 CLINICAL DATA:  Recent removal of abdominal drain EXAM: CT ABDOMEN AND PELVIS WITH CONTRAST TECHNIQUE: Multidetector CT imaging of the abdomen and pelvis was performed using the standard protocol following bolus administration of intravenous contrast. CONTRAST:  153mL OMNIPAQUE IOHEXOL 300 MG/ML  SOLN COMPARISON:  CT 01/29/2021, 09/26/2020 FINDINGS: Lower chest: Lung bases demonstrate no acute consolidation or effusion. Normal cardiac size. Hepatobiliary: Contracted gallbladder. No calcified stone or focal parenchymal abnormality within the liver. No biliary dilatation Pancreas: Unremarkable. No pancreatic ductal dilatation or surrounding inflammatory changes. Spleen: Normal in size without focal abnormality. Adrenals/Urinary Tract: Adrenal glands are unremarkable. Kidneys are normal, without renal calculi, focal lesion, or hydronephrosis. Bladder is unremarkable. Stomach/Bowel: The stomach is unremarkable. No dilated small bowel. Postsurgical changes of the right and sigmoid colon. No acute bowel wall thickening. Vascular/Lymphatic: Mild aortic atherosclerosis without aneurysm. No suspicious nodes Reproductive: Prostate is unremarkable. Other: Negative for pelvic effusion. Small subcutaneous cyst in the left gluteal region. The patient is status post multiple ventral hernia repair. Large gas and fluid collection within the anterior abdominal wall measuring 14 cm transverse by 5.2 cm AP by 16.3 cm craniocaudad. Mild rim enhancement suspicious for abscess. There are 2 smaller slightly thick-walled rim enhancing fluid  collections within the right abdominal wall subcutaneous soft tissues superficial to the rectus. The more cranial fluid collection appears to involve or abut the skin surface and measures 2.4 x 2.7 cm, series 2, image 40. The more caudal fluid collection at the level of the umbilicus measures 2.8 by 2.1 cm, series 2, image 55. Musculoskeletal: No acute or significant osseous findings. IMPRESSION: 1. Large gas and fluid collection within the anterior abdominal wall with associated rim enhancement, suspicious for abscess. There are 2 additional smaller slightly thick-walled rim enhancing collections within the subcutaneous soft tissues of the right abdominal wall, also suspect for abscess. Electronically Signed   By: Donavan Foil M.D.   On: 02/25/2021 18:28   IR Guided Niel Hummer W Catheter Placement  Result Date: 02/14/2021 INDICATION: 40 year old male status post hernia repair and mesh placement on 01/04/2021 with abdominal pain and a large sub peritoneal fluid collection identified on CT imaging from 01/29/2021. Patient presents for aspiration/drain placement. EXAM: Ultrasound-guided drain placement Ultrasound-guided aspiration MEDICATIONS: None. ANESTHESIA/SEDATION: Fentanyl 200 mcg IV; Versed 4 mg IV Moderate Sedation Time:  32 minutes The patient was continuously monitored during the procedure by the interventional radiology nurse under my direct supervision. COMPLICATIONS: None immediate. PROCEDURE: Informed written consent was obtained from the patient after a thorough discussion of the procedural risks, benefits and alternatives. All questions were addressed. Maximal Sterile Barrier Technique was utilized including caps, mask, sterile gowns, sterile gloves, sterile drape, hand hygiene and skin antiseptic. A timeout was performed prior to the initiation of the procedure. The entire abdomen was sterilely prepped and draped in standard fashion using chlorhexidine skin prep. The anterior abdominal wall was  interrogated with ultrasound. The sub peritoneal fluid collection is highly complex and multiloculated in nature. Additionally, the separate smaller fluid collections in the subcutaneous fat superficial to the abdominal wall musculature are also highly complex and multiloculated. First, aspiration of the 2 smaller superficial collections was performed. Each fluid collection was targeted sonographically. Local anesthesia was attained by infiltration with 1% lidocaine. A small dermatotomy was made with an 18 gauge hypodermic needle. Subsequently, an 18 gauge  trocar needle was advanced through the dermatotomy and with ultrasound positioned in the fluid collection. Aspiration yields a small volume of brownish red fluid consistent with old hematoma. The 2 collections were successfully aspirated yielding approximately 10 mL fluid total. Next, attention was turned to the larger sub peritoneal fluid collection. An approach from the right upper quadrant was selected. Local anesthesia was again attained by infiltration with 1% lidocaine. A small dermatotomy was made with an 11 blade scalpel. Under real-time ultrasound guidance, a 15 cm 18 gauge trocar needle was advanced through the abdominal wall and into the fluid collection. The needle was advanced through multiple of the larger loculations. A 0.035 Amplatz wire was then advanced through the needle and coiled in the fluid collection. The subcutaneous tract through the abdominal wall was dilated to 12 [place]. A Cook 12 [place] all-purpose drainage catheter modified with multiple additional sideholes was then advanced over the wire and into the multiloculated fluid collection. Aspiration was then performed using an abscess push pole evacuation system. Approximately 1 L of thin brownish red fluid was successfully aspirated. The fluid appearance is most consistent with that of old blood products. A sample was sent for Gram stain and culture. IMPRESSION: 1. Placement of 12  French drainage catheter modified with additional sideholes into the highly complex and loculated sub peritoneal fluid collection. Aspiration yields approximately 1 L of old blood. Findings are most consistent with old hematoma. A sample of the aspirated material was sent for Gram stain and culture. 2. Successful ultrasound-guided aspiration of 2 superficial fluid collections. PLAN: Drain left to JP bulb suction. Patient to follow-up in IR clinic next week. Presuming minimal additional output by that time, the drainage catheter can be removed. No planned additional imaging and less there is clinical evidence concerning for a new or untreated issue. Electronically Signed   By: Jacqulynn Cadet M.D.   On: 02/14/2021 10:59   IR Radiologist Eval & Mgmt  Result Date: 02/19/2021 Please refer to notes tab for details about interventional procedure. (Op Note)   Labs:  CBC: Recent Labs    01/29/21 1844 02/14/21 0824 02/24/21 1355 02/25/21 1548  WBC 9.9 7.8 16.3* 13.7*  HGB 12.5* 13.1 13.3 12.4*  HCT 39.1 41.4 40.5 37.8*  PLT 310 224 242 234    COAGS: Recent Labs    02/14/21 0824  INR 1.0    BMP: Recent Labs    03/29/20 1631 06/30/20 2348 07/29/20 1528 11/21/20 1358 12/25/20 1126 01/04/21 1124 01/05/21 0742 01/29/21 1844 02/24/21 1355 02/25/21 1548  NA 138 133*   < > 137   < >  --  134* 135 133* 133*  K 4.0 2.6*   < > 4.2   < >  --  3.7 3.5 3.8 3.5  CL 98 92*   < > 99   < >  --  98 98 96 98  CO2 23 28   < > 25   < >  --  $R'25 27 20 25  'NZ$ GLUCOSE 283* 245*   < > 290*   < >  --  259* 254* 304* 369*  BUN 5* 7   < > 8   < >  --  $R'18 11 6 7  'qT$ CALCIUM 9.5 9.3   < > 9.4   < >  --  8.4* 9.1 9.2 9.1  CREATININE 0.94 0.87   < > 0.87   < > 1.00 0.82 0.75 0.80 0.84  GFRNONAA 102 >60   < >  109   < > >60 >60 >60  --  >60  GFRAA 118 >60  --  126  --   --   --   --   --   --    < > = values in this interval not displayed.    LIVER FUNCTION TESTS: Recent Labs    09/26/20 1432 01/29/21 1844  02/24/21 1355 02/25/21 1548  BILITOT 0.5 0.5 0.3 0.3  AST $Re'18 15 9 'nbJ$ 14*  ALT $Re'25 16 8 14  'Kfm$ ALKPHOS 87 87 103 92  PROT 7.5 7.8 7.6 8.2*  ALBUMIN 4.0 3.9 4.1 3.5     Assessment and Plan:  History of laparoscopic incisional hernia repair with mesh/lysis of adhesions in OR 01/04/2021 by Dr. Kieth Brightly; complicated by development of intra-abdominal fluid collection s/p intra-abdominal drain placement in IR 02/14/2021 which was subsequently removed in IR 02/19/2021 secondary to low output; further complicated by re-accumulation of intra-abdominal fluid collection. Plan for image-guided intra-abdominal fluid collection aspiration with possible drain placement today in IR. Patient is NPO. Patient febrile secondary to intra-abdominal fluid collection- ok to proceed per Dr. Pascal Lux. He does not take blood thinners. INR 1.0 02/14/2021. CBG >300 today; per patient's wife patient non-compliant with medications. Dr. Pascal Lux discussed result with patient and offered treatment (insulin)- patient refuses insulin at this time. Ok to proceed per Dr. Pascal Lux. Patient states that he has saline flushes at home from prior drain, no flushes needed on D/C. Patient to follow-up at IR clinic 10-14 days after discharge (IR schedulers to call patient to set up this appointment).  Risks and benefits discussed with the patient including bleeding, infection, damage to adjacent structures, bowel perforation/fistula connection, and sepsis. All of the patient's questions were answered, patient is agreeable to proceed. Consent signed and in chart.   Thank you for this interesting consult.  I greatly enjoyed [place] and look forward to participating in their care.  A copy of this report was sent to the requesting provider on this date.  Electronically Signed: Earley Abide, PA-C 03/14/2021, 8:39 AM   I spent a total of 25 Minutes in face to face in clinical consultation, greater than 50% of which was  counseling/coordinating care for intra-abdominal fluid collection/aspiration and drainage.

## 2021-03-14 NOTE — Procedures (Signed)
Pre procedural Dx: Post op abscess Post procedural Dx: Same  Technically successful CT guided placed of a 14 Fr drainage catheter placement into the recurrent ventral wall abscess yielding 1 L of purulent appearing fluid.    A representative aspirated sample was capped and sent to the laboratory for analysis.    EBL: Trace Complications: None immediate  Ronny Bacon, MD Pager #: [phone]

## 2021-03-16 ENCOUNTER — Other Ambulatory Visit: Payer: Self-pay | Admitting: Student in an Organized Health Care Education/Training Program

## 2021-03-16 DIAGNOSIS — K219 Gastro-esophageal reflux disease without esophagitis: Secondary | ICD-10-CM

## 2021-03-19 LAB — AEROBIC/ANAEROBIC CULTURE W GRAM STAIN (SURGICAL/DEEP WOUND)

## 2021-03-21 ENCOUNTER — Ambulatory Visit
Admission: RE | Admit: 2021-03-21 | Discharge: 2021-03-21 | Disposition: A | Discharge status: Home / Self Care | Payer: Medicare Other | Source: Ambulatory Visit | Attending: General Surgery | Admitting: General Surgery

## 2021-03-21 ENCOUNTER — Other Ambulatory Visit: Payer: Self-pay | Admitting: General Surgery

## 2021-03-21 ENCOUNTER — Ambulatory Visit
Admission: RE | Admit: 2021-03-21 | Discharge: 2021-03-21 | Disposition: A | Discharge status: Home / Self Care | Payer: Medicare Other | Source: Ambulatory Visit | Attending: Student | Admitting: Student

## 2021-03-21 DIAGNOSIS — L02211 Cutaneous abscess of abdominal wall: Secondary | ICD-10-CM

## 2021-03-21 DIAGNOSIS — T8143XA Infection following a procedure, organ and space surgical site, initial encounter: Secondary | ICD-10-CM | POA: Diagnosis not present

## 2021-03-21 DIAGNOSIS — N200 Calculus of kidney: Secondary | ICD-10-CM | POA: Diagnosis not present

## 2021-03-21 HISTORY — PX: IR RADIOLOGIST EVAL & MGMT: IMG5224

## 2021-03-21 NOTE — Progress Notes (Signed)
Referring Physician(s): Kinsinger,L  Chief Complaint: The patient is seen in follow up today s/p image guided drainage of a ventral wall/peritoneal abscess on 03/14/2021  History of present illness: Mr. [name] is a 40 year old male with history of incisional ventral hernia repair complicated by development of ventral wall abscess requiring percutaneous drainage catheter placement on 02/14/2021.  The drainage catheter was subsequently removed in the IR clinic on 02/19/2021.  Follow-up CT on 02/25/2021 revealed recurrence of the ventral wall abscess and patient underwent repeat drainage on 03/14/2021.  Drain fluid cultures grew staph aureus.  He presents today for follow-up drain evaluation.  The patient is currently on antibiotic therapy.  He denies fever or respiratory problems.  He does have some occasional dysphagia and intermittent nausea and vomiting.  He states his appetite is improving and that he has less abdominal pain since drain replaced.  He has previously been flushing the drain 2-3 times a day.  Drain output is still significant and is turbid, beige-colored in appearance.  He does have some purulent drainage noted around catheter insertion site as well as midline wound.  The drain is connected to gravity bag.   Past Medical History:  Diagnosis Date  . Allergy   . Anxiety   . 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 Bipolar disorder ([place])    On disability  . Depression   . Diabetes mellitus without complication ([place])    per patient : under control with diet, does not monitor cbg at home   . GERD (gastroesophageal reflux disease)   . Headache(784.0)    daily headaches  . Hyperlipidemia   . Neuromuscular disorder ([place])    Beckers muscular dystrophy  . Obesity   . PTSD (post-traumatic stress disorder)   . Sleep apnea    Does not tolerate CPAP    Past Surgical History:  Procedure Laterality Date  . APPLICATION OF WOUND VAC N/A 11/23/2018   Procedure: Application Of Wound Vac;   Surgeon: Kinsinger, Arta Bruce, MD;  Location: [facility];  Service: General;  Laterality: N/A;  . COLECTOMY N/A 11/23/2018   Procedure: TOTAL COLECTOMY;  Surgeon: Kieth Brightly Arta Bruce, MD;  Location: [facility];  Service: General;  Laterality: N/A;  . ILEOSTOMY N/A 11/23/2018   Procedure: ILEOSTOMY;  Surgeon: Kinsinger, Arta Bruce, MD;  Location: [facility];  Service: General;  Laterality: N/A;  . ILEOSTOMY CLOSURE N/A 05/19/2019   Procedure: ILEOSTOMY REVERSAL WITH ILEOCOLIC AND COLORECTAL ANASTOMOSIS;  Surgeon: Kieth Brightly Arta Bruce, MD;  Location: [facility];  Service: General;  Laterality: N/A;  . INCISIONAL HERNIA REPAIR N/A 01/04/2021   Procedure: LAPAROSCOPIC INCISIONAL HERNIA REPAIR WITH MESH; LYSIS OF ADHESIONS;  Surgeon: Kieth Brightly Arta Bruce, MD;  Location: [facility];  Service: General;  Laterality: N/A;  . IR GUIDED DRAIN W CATHETER PLACEMENT  02/14/2021  . IR RADIOLOGIST EVAL & MGMT  02/19/2021  . NM MYOCAR PERF WALL MOTION  01/22/2011   protocol: Persantine, moderate reversible inferior defect post stress EF 48%, high risk scan  . TONSILLECTOMY    . TRANSTHORACIC ECHOCARDIOGRAM  07/05/2004   EF=>55% normal study     Allergies: Metformin and related, Penicillins, and Nsaids  Medications: Prior to Admission medications   Medication Sig Start Date End Date Taking? Authorizing Provider  Blood Glucose Monitoring Suppl (ONE TOUCH ULTRA 2) w/Device KIT Measure blood sugar daily and with meals for insulin 01/08/21   Anderson, Chelsey L, DO  busPIRone (BUSPAR) 10 MG tablet Take 10 mg by mouth 3 (three) times daily.  Patient not taking: No sig reported 10/25/20   [provider]  ciprofloxacin (CIPRO) 500 MG tablet Take 1 tablet (500 mg total) by mouth 2 (two) times daily. 02/25/21   Carmin Muskrat, MD  diphenhydramine-acetaminophen (TYLENOL PM) 25-500 MG TABS tablet Take 3 tablets by mouth at bedtime as needed (Sleep).    [provider]  Dulaglutide (TRULICITY) 5.97 CB/6.3AG SOPN Inject 0.75 mg  into the skin once a week. Patient not taking: Reported on 02/25/2021 02/06/21   Doristine Mango L, DO  gabapentin (NEURONTIN) 300 MG capsule Take 300 mg by mouth daily as needed (pain). 06/20/16   [provider]  ibuprofen (ADVIL) 800 MG tablet Take 1 tablet (800 mg total) by mouth every 8 (eight) hours as needed. 01/04/21   Kinsinger, Arta Bruce, MD  Insulin Pen Needle 32G X 6 MM MISC 1 packet by Does not apply route 2 (two) times daily. Patient not taking: No sig reported 03/29/20   Anderson, Chelsey L, DO  JARDIANCE 25 MG TABS tablet TAKE ONE TABLET BY MOUTH DAILY. Patient not taking: Reported on 02/25/2021 01/14/21   Doristine Mango L, DO  Lancets (ONETOUCH DELICA PLUS [id number]) MISC USE TO TEST TWO TIMES A DAY 12/21/19   Anderson, Chelsey L, DO  LANTUS SOLOSTAR 100 UNIT/ML Solostar Pen INJECT 40 UNITS INTO THE SKIN EVERY MORNING AS DIRECTED Patient not taking: Reported on 03/14/2021 02/14/21   Doristine Mango L, DO  lurasidone (LATUDA) 40 MG TABS tablet Take 1 tablet (40 mg total) by mouth at bedtime. 01/04/21   Kinsinger, Arta Bruce, MD  Melatonin 1 MG SUBL Place 1 mg under the tongue at bedtime. 02/06/21   Anderson, Chelsey L, DO  metroNIDAZOLE (FLAGYL) 500 MG tablet Take 1 tablet (500 mg total) by mouth 2 (two) times daily. 02/25/21   Carmin Muskrat, MD  ns flush (NS FLUSH) 0.9% SOLN Flush catheter 1 to 2 times daily with 5 to 82m's of saline Patient not taking: No sig reported 02/14/21   MDocia Barrier PA  omeprazole (PRILOSEC) 20 MG capsule TAKE 2 CAPSULES (40 MG TOTAL) BY MOUTH AT BEDTIME. 03/20/21   Anderson, Chelsey L, DO  ONETOUCH ULTRA test strip USE TO TEST TWO TIMES A DAY 02/19/21   Anderson, Chelsey L, DO  oxyCODONE (OXY IR/ROXICODONE) 5 MG immediate release tablet Take 1-2 tablets (5-10 mg total) by mouth every 4 (four) hours as needed for moderate pain. 01/04/21   Kinsinger, LArta Bruce MD  oxyCODONE (ROXICODONE) 5 MG immediate release tablet Take 1 tablet (5 mg total)  by mouth every 6 (six) hours as needed for up to 12 doses for severe pain. 01/29/21   TWyvonnia Dusky MD  promethazine (PHENERGAN) 50 MG tablet Take 1 tablet (50 mg total) by mouth every 6 (six) hours as needed for nausea or vomiting. 02/06/21   ADoristine MangoL, DO  traZODone (DESYREL) 100 MG tablet Take 50-100 mg by mouth at bedtime. 12/05/19   [provider]  VYVANSE 20 MG capsule Take 20 mg by mouth daily with breakfast. Take with 50 mg for a total of 70 mg 06/28/18   [provider]  VYVANSE 50 MG capsule Take 50 mg by mouth See admin instructions. Take with 20 mg for a total of 70 mg in the morning 06/28/18   [provider]     Family History  Problem Relation Age of Onset  . COPD Mother   . Depression Mother   . Diabetes Mother   .  Hyperlipidemia Mother   . Asthma Father   . Arthritis Father   . Diabetes Father   . Heart disease Father   . Hyperlipidemia Father   . Hypertension Father   . Hypertension Brother     Social History   Socioeconomic History  . Marital status: Married    Spouse name: Not on file  . Number of children: 2  . Years of education: 3  . Highest education level: Not on file  Occupational History    Employer: UNEMPLOYED  Tobacco Use  . Smoking status: Current Every Day Smoker    Packs/day: 1.50    Years: 21.00    Pack years: 31.50    Types: Cigarettes  . Smokeless tobacco: Never Used  Vaping Use  . Vaping Use: Former  . Devices: Increased nicotine cravings  Substance and Sexual Activity  . Alcohol use: No    Alcohol/week: 0.0 standard drinks    Comment: quit 2013  . Drug use: Not Currently    Types: Marijuana    Comment: 7-27  . Sexual activity: Yes    Birth control/protection: None  Other Topics Concern  . Not on file  Social History Narrative   Patient lives at home with parents, brother and brothers wife.    Patient is single.    Patient has 2 children.    Patient has a high school education.     Patient is unemployed.    Patient is right handed.    Social Determinants of Health   Financial Resource Strain: Not on file  Food Insecurity: Not on file  Transportation Needs: Not on file  Physical Activity: Not on file  Stress: Not on file  Social Connections: Not on file     Vital Signs: There were no vitals taken for this visit.  Physical Exam awake, alert.  Right abdominal drain intact, insertion site mildly erythematous with a small amount of purulent fluid at insertion site.  Site mild to moderately tender to palpation.  Turbid beige-colored fluid in drain bag.  Small amount of purulent material also at midline wound.  Imaging: CT ABDOMEN PELVIS WO CONTRAST  Result Date: 03/21/2021 CLINICAL DATA:  History of incisional ventral hernia repair complicated by development of a ventral wall abscess requiring percutaneous drainage catheter placement on 02/14/2021 however the drainage catheter was subsequently removed on 02/19/2021 with subsequent abdominal CT performed 02/25/2021 demonstrating recurrence of the ventral wall abscess for which patient underwent repeat percutaneous drainage catheter placement on 03/14/2021. Patient returns to the interventional radiology drainage clinic for drainage catheter evaluation and management. The patient reports little output from the drainage catheter for the past several days, rather he states that he has been draining from his midline abdominal wound. He continues to flush the percutaneous drainage catheter 3 times a day with saline. EXAM: CT ABDOMEN AND PELVIS WITHOUT CONTRAST TECHNIQUE: Multidetector CT imaging of the abdomen and pelvis was performed following the standard protocol without IV contrast. COMPARISON:  CT-guided percutaneous drainage catheter placement-03/14/2021 CT abdomen and pelvis-02/25/2021 FINDINGS: Lower chest: Limited visualization of the lower thorax is negative for focal airspace opacity or pleural effusion. Normal heart size.   No pericardial effusion. Hepatobiliary: There is mild diffuse decreased attenuation of the hepatic parenchyma suggestive hepatic steatosis. Mild nodularity hepatic contour. Normal noncontrast appearance of the gallbladder given underdistention. No radiopaque gallstones. No ascites. Pancreas: Normal noncontrast appearance of the pancreas. Spleen: Normal noncontrast appearance of the spleen. Adrenals/Urinary Tract: Increased attenuation of the bilateral renal cortices, nonspecific though  could be seen in the setting medullary nephrocalcinosis. There is a solitary punctate (2 mm) nonobstructing stone within the superior pole of the left kidney (coronal image 102, series 4). No discrete right-sided renal stones. There is a minimal amount of likely body habitus related symmetric perinephric stranding. No urinary obstruction. Normal noncontrast appearance of the bilateral adrenal glands. Normal noncontrast appearance of the urinary bladder given degree of distention. Stomach/Bowel: Postoperative change of sigmoid colon. Post right hemicolectomy. No evidence of enteric obstruction. No significant hiatal hernia. No pneumoperitoneum, pneumatosis or portal venous gas. Vascular/Lymphatic: Minimal amount of atherosclerotic plaque within a normal caliber abdominal aorta. No bulky retroperitoneal, mesenteric, pelvic or inguinal lymphadenopathy. Reproductive: Dystrophic calcifications within normal sized prostate gland. No free fluid within the pelvic cul-de-sac. Other: Interval reduction/near resolution of large ventral abdominal wall abscess following percutaneous drainage catheter placement with minimal amount of ill-defined fluid and air tracking within the decompressed abscess cavity extending to the ventral abdominal wall wound (representative images 53 and 61, series 2). While measurements are difficult given the irregular shape of the decompressed abscess cavity is estimated to measure approximately 13.1 x 13.2 x 3.7 cm  (axial image 52, series 2; sagittal image 120, series 6), previously, at least 15.5 x 10.0 cm prior percutaneous drainage catheter placement on 03/14/2021. Residual nodular soft tissue and fluid is seen at the location of previous ostomy site (image 56, series 2), without definitive enteric herniation. Musculoskeletal: No acute or aggressive osseous abnormalities. Stigmata of dish within the lower thoracic spine. IMPRESSION: 1. Interval reduction of ventral wall abscess following percutaneous drainage catheter placement with residual air and fluid within the decompressed abscess cavity tracking to the midline abdominal wound. No new definable/drainable fluid collections within abdomen or pelvis. 2. Stable sequela of right hemicolectomy and postoperative change of the sigmoid colon without evidence of enteric obstruction. 3. Suspected hepatic steatosis with early cirrhotic change. Correlation with LFTs is advised. 4. Solitary punctate (2 mm) nonobstructing left-sided renal stone. Increased attenuation of the bilateral renal cortices is nonspecific though could be seen in the setting of medullary nephrocalcinosis. Clinical correlation is advised. No evidence of urinary obstruction. 5. Aortic Atherosclerosis (ICD10-I70.0). PLAN: Patient subsequently underwent percutaneous drainage catheter injection. Electronically Signed   By: Sandi Mariscal M.D.   On: 03/21/2021 13:30    Labs:  CBC: Recent Labs    02/14/21 0824 02/24/21 1355 02/25/21 1548 03/14/21 0740  WBC 7.8 16.3* 13.7* 15.1*  HGB 13.1 13.3 12.4* 12.7*  HCT 41.4 40.5 37.8* 39.6  PLT 224 242 234 424*    COAGS: Recent Labs    02/14/21 0824  INR 1.0    BMP: Recent Labs    03/29/20 1631 06/30/20 2348 07/29/20 1528 11/21/20 1358 12/25/20 1126 01/04/21 1124 01/05/21 0742 01/29/21 1844 02/24/21 1355 02/25/21 1548  NA 138 133*   < > 137   < >  --  134* 135 133* 133*  K 4.0 2.6*   < > 4.2   < >  --  3.7 3.5 3.8 3.5  CL 98 92*   < > 99    < >  --  98 98 96 98  CO2 23 28   < > 25   < >  --  _0 GLUCOSE 283* 245*   < > 290*   < >  --  259* 254* 304* 369*  BUN 5* 7   < > 8   < >  --  _1 7  CALCIUM 9.5 9.3   < > 9.4   < >  --  8.4* 9.1 9.2 9.1  CREATININE 0.94 0.87   < > 0.87   < > 1.00 0.82 0.75 0.80 0.84  GFRNONAA 102 >60   < > 109   < > >60 >60 >60  --  >60  GFRAA 118 >60  --  126  --   --   --   --   --   --    < > = values in this interval not displayed.    LIVER FUNCTION TESTS: Recent Labs    09/26/20 1432 01/29/21 1844 02/24/21 1355 02/25/21 1548  BILITOT 0.5 0.5 0.3 0.3  AST _0 14*  ALT _1 ALKPHOS 87 87 103 92  PROT 7.5 7.8 7.6 8.2*  ALBUMIN 4.0 3.9 4.1 3.5    Assessment: 40 year old male with history of incisional ventral hernia repair complicated by development of ventral wall abscess requiring percutaneous drainage catheter placement on 02/14/2021.  The drainage catheter was subsequently removed in the IR clinic on 02/19/2021.  Follow-up CT on 02/25/2021 revealed recurrence of the ventral wall abscess and patient underwent repeat drainage on 03/14/2021.  Drain fluid cultures grew staph aureus.  He presents today for follow-up drain evaluation.  The patient is currently on antibiotic therapy.  He denies fever or respiratory problems.  He does have some occasional dysphagia and intermittent nausea and vomiting.  He states his appetite is improving and that he has less abdominal pain since drain replaced.  He has previously been flushing the drain 2-3 times a day.  Drain output is still significant and is turbid, beige-colored in appearance.  He does have some purulent drainage noted around catheter insertion site as well as midline wound.  The drain is connected to gravity bag.  Follow-up CT abdomen pelvis today reveals reduction of ventral wall abscess following drain placement with residual air and fluid within the decompressed abscess cavity tracking to the midline abdominal wound.  No new  definable/drainable fluid collections within the abdomen or pelvis.  Drain injection today reveals no obvious communication with bowel or midline wound at this time.  Patient requesting conversion of drain back to JP bulb which we changed today.  Patient was instructed to flush the drain 2-3 times a day especially if output decreases or if thick fluid noted.  He was instructed to maintain drain output records and change gauze dressing as needed.  He will be scheduled for follow-up CT and drain injection in 2 weeks.  He is scheduled to follow-up with surgery on 6/8.  Above plan discussed with patient's spouse as well.   Signed: D. Rowe Robert, PA-C 03/21/2021, 2:27 PM   Please refer to Dr. Pascal Lux' attestation of this note for management and plan.      Patient ID: [name], male   DOB: 16-May-1981, 40 y.o.   MRN: [id number]

## 2021-03-25 ENCOUNTER — Ambulatory Visit (HOSPITAL_COMMUNITY): Payer: Medicare Other | Attending: General Surgery | Admitting: Physical Therapy

## 2021-03-25 ENCOUNTER — Other Ambulatory Visit: Payer: Self-pay | Admitting: General Surgery

## 2021-03-25 DIAGNOSIS — L02211 Cutaneous abscess of abdominal wall: Secondary | ICD-10-CM

## 2021-03-26 ENCOUNTER — Other Ambulatory Visit: Payer: Medicare Other

## 2021-04-01 ENCOUNTER — Ambulatory Visit: Payer: Self-pay | Admitting: General Surgery

## 2021-04-03 ENCOUNTER — Ambulatory Visit
Admission: RE | Admit: 2021-04-03 | Discharge: 2021-04-03 | Disposition: A | Discharge status: Home / Self Care | Payer: Medicare Other | Source: Ambulatory Visit | Attending: General Surgery | Admitting: General Surgery

## 2021-04-03 ENCOUNTER — Encounter: Payer: Self-pay | Admitting: *Deleted

## 2021-04-03 ENCOUNTER — Other Ambulatory Visit: Payer: Medicare Other

## 2021-04-03 ENCOUNTER — Other Ambulatory Visit: Payer: Self-pay | Admitting: General Surgery

## 2021-04-03 DIAGNOSIS — L02211 Cutaneous abscess of abdominal wall: Secondary | ICD-10-CM

## 2021-04-03 DIAGNOSIS — T8143XD Infection following a procedure, organ and space surgical site, subsequent encounter: Secondary | ICD-10-CM | POA: Diagnosis not present

## 2021-04-03 HISTORY — PX: IR RADIOLOGIST EVAL & MGMT: IMG5224

## 2021-04-03 NOTE — Progress Notes (Signed)
Referring Physician(s): Mickeal Skinner  Chief Complaint: The patient is seen in follow up today s/p intra-abdominal fluid collection  History of present illness: [name] is a 40 year old male with past medical history of HLD, headaches, Becker's muscular dystrophy, GERD, DM, PTSD, anxiety/depression who underwent laparoscopic incisional hernia repair with mesh and LOA 01/04/21.   He developed a post-op fluid collection and underwent drain placement 02/14/21 which was removed the following week due to low output and evidence of resolve of the fluid collection, however he developed reaccumulation with concern for infection requiring drain replacement 03/14/21.    [name] returns to IR drain clinic today for evaluation and management of his abdominal drain.  He reports that since his last evaluation his output has decreased significantly and he is now getting a trace amount of cloudy, pink fluid from his drain daily. He reports he has surgery scheduled for 6/27 for mesh removal but is hesistant/anxious considering the extent of complication and recovery with his last surgery.    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[place])    Bipolar disorder ([place])    On disability   Depression    Diabetes mellitus without complication ([place])    per patient : under control with diet, does not monitor cbg at home    GERD (gastroesophageal reflux disease)    Headache(784.0)    daily headaches   Hyperlipidemia    Neuromuscular disorder ([place])    Beckers muscular dystrophy   Obesity    PTSD (post-traumatic stress disorder)    Sleep apnea    Does not tolerate CPAP    Past Surgical History:  Procedure Laterality Date   APPLICATION OF WOUND VAC N/A 11/23/2018   Procedure: Application Of Wound Vac;  Surgeon: Kinsinger, Arta Bruce, MD;  Location: [facility];  Service: General;  Laterality: N/A;   COLECTOMY N/A 11/23/2018   Procedure: TOTAL COLECTOMY;   Surgeon: Mickeal Skinner, MD;  Location: [facility];  Service: General;  Laterality: N/A;   ILEOSTOMY N/A 11/23/2018   Procedure: ILEOSTOMY;  Surgeon: Kinsinger, Arta Bruce, MD;  Location: [facility];  Service: General;  Laterality: N/A;   ILEOSTOMY CLOSURE N/A 05/19/2019   Procedure: ILEOSTOMY REVERSAL WITH ILEOCOLIC AND COLORECTAL ANASTOMOSIS;  Surgeon: Kieth Brightly, Arta Bruce, MD;  Location: [facility];  Service: General;  Laterality: N/A;   INCISIONAL HERNIA REPAIR N/A 01/04/2021   Procedure: LAPAROSCOPIC INCISIONAL HERNIA REPAIR WITH MESH; LYSIS OF ADHESIONS;  Surgeon: Kieth Brightly, Arta Bruce, MD;  Location: [facility];  Service: General;  Laterality: N/A;   IR GUIDED DRAIN W CATHETER PLACEMENT  02/14/2021   IR RADIOLOGIST EVAL & MGMT  02/19/2021   IR RADIOLOGIST EVAL & MGMT  03/21/2021   NM MYOCAR PERF WALL MOTION  01/22/2011   protocol: Persantine, moderate reversible inferior defect post stress EF 48%, high risk scan   TONSILLECTOMY     TRANSTHORACIC ECHOCARDIOGRAM  07/05/2004   EF=>55% normal study     Allergies: Metformin and related, Penicillins, and Nsaids  Medications: Prior to Admission medications   Medication Sig Start Date End Date Taking? Authorizing Provider  Blood Glucose Monitoring Suppl (ONE TOUCH ULTRA 2) w/Device KIT Measure blood sugar daily and with meals for insulin 01/08/21   Anderson, Chelsey L, DO  busPIRone (BUSPAR) 10 MG tablet Take 10 mg by mouth 3 (three) times daily. Patient not taking: No sig reported 10/25/20   [provider]  ciprofloxacin (  CIPRO) 500 MG tablet Take 1 tablet (500 mg total) by mouth 2 (two) times daily. 02/25/21   Gerhard Munch, MD  diphenhydramine-acetaminophen (TYLENOL PM) 25-500 MG TABS tablet Take 3 tablets by mouth at bedtime as needed (Sleep).    [provider]  Dulaglutide (TRULICITY) 0.75 MG/0.5ML SOPN Inject 0.75 mg into the skin once a week. Patient not taking: Reported on 02/25/2021 02/06/21   Jamelle Rushing L, DO  gabapentin  (NEURONTIN) 300 MG capsule Take 300 mg by mouth daily as needed (pain). 06/20/16   [provider]  ibuprofen (ADVIL) 800 MG tablet Take 1 tablet (800 mg total) by mouth every 8 (eight) hours as needed. 01/04/21   Kinsinger, De Blanch, MD  Insulin Pen Needle 32G X 6 MM MISC 1 packet by Does not apply route 2 (two) times daily. Patient not taking: No sig reported 03/29/20   Anderson, Chelsey L, DO  JARDIANCE 25 MG TABS tablet TAKE ONE TABLET BY MOUTH DAILY. Patient not taking: Reported on 02/25/2021 01/14/21   Jamelle Rushing L, DO  Lancets (ONETOUCH DELICA PLUS [place]) MISC USE TO TEST TWO TIMES A DAY 12/21/19   Anderson, Chelsey L, DO  LANTUS SOLOSTAR 100 UNIT/ML Solostar Pen INJECT 40 UNITS INTO THE SKIN EVERY MORNING AS DIRECTED Patient not taking: Reported on 03/14/2021 02/14/21   Jamelle Rushing L, DO  lurasidone (LATUDA) 40 MG TABS tablet Take 1 tablet (40 mg total) by mouth at bedtime. 01/04/21   Kinsinger, De Blanch, MD  Melatonin 1 MG SUBL Place 1 mg under the tongue at bedtime. 02/06/21   Anderson, Chelsey L, DO  metroNIDAZOLE (FLAGYL) 500 MG tablet Take 1 tablet (500 mg total) by mouth 2 (two) times daily. 02/25/21   Gerhard Munch, MD  ns flush (NS FLUSH) 0.9% SOLN Flush catheter 1 to 2 times daily with 5 to 8ml's of saline Patient not taking: No sig reported 02/14/21   Hoyt Koch, PA  omeprazole (PRILOSEC) 20 MG capsule TAKE 2 CAPSULES (40 MG TOTAL) BY MOUTH AT BEDTIME. 03/20/21   Anderson, Chelsey L, DO  ONETOUCH ULTRA test strip USE TO TEST TWO TIMES A DAY 02/19/21   Anderson, Chelsey L, DO  oxyCODONE (OXY IR/ROXICODONE) 5 MG immediate release tablet Take 1-2 tablets (5-10 mg total) by mouth every 4 (four) hours as needed for moderate pain. 01/04/21   Kinsinger, De Blanch, MD  oxyCODONE (ROXICODONE) 5 MG immediate release tablet Take 1 tablet (5 mg total) by mouth every 6 (six) hours as needed for up to 12 doses for severe pain. 01/29/21   Terald Sleeper, MD   promethazine (PHENERGAN) 50 MG tablet Take 1 tablet (50 mg total) by mouth every 6 (six) hours as needed for nausea or vomiting. 02/06/21   Jamelle Rushing L, DO  traZODone (DESYREL) 100 MG tablet Take 50-100 mg by mouth at bedtime. 12/05/19   [provider]  VYVANSE 20 MG capsule Take 20 mg by mouth daily with breakfast. Take with 50 mg for a total of 70 mg 06/28/18   [provider]  VYVANSE 50 MG capsule Take 50 mg by mouth See admin instructions. Take with 20 mg for a total of 70 mg in the morning 06/28/18   [provider]     Family History  Problem Relation Age of Onset   COPD Mother    Depression Mother    Diabetes Mother    Hyperlipidemia Mother    Asthma Father    Arthritis Father  Diabetes Father    Heart disease Father    Hyperlipidemia Father    Hypertension Father    Hypertension Brother     Social History   Socioeconomic History   Marital status: Married    Spouse name: Not on file   Number of children: 2   Years of education: 12   Highest education level: Not on file  Occupational History    Employer: UNEMPLOYED  Tobacco Use   Smoking status: Every Day    Packs/day: 1.50    Years: 21.00    Pack years: 31.50    Types: Cigarettes   Smokeless tobacco: Never  Vaping Use   Vaping Use: Former   Devices: Increased nicotine cravings  Substance and Sexual Activity   Alcohol use: No    Alcohol/week: 0.0 standard drinks    Comment: quit 2013   Drug use: Not Currently    Types: Marijuana    Comment: 7-27   Sexual activity: Yes    Birth control/protection: None  Other Topics Concern   Not on file  Social History Narrative   Patient lives at home with parents, brother and brothers wife.    Patient is single.    Patient has 2 children.    Patient has a high school education.    Patient is unemployed.    Patient is right handed.    Social Determinants of Health   Financial Resource Strain: Not on file  Food Insecurity: Not on  file  Transportation Needs: Not on file  Physical Activity: Not on file  Stress: Not on file  Social Connections: Not on file     Vital Signs: There were no vitals taken for this visit.  Physical Exam NAD, alert Abdomen: soft, non-tender. Right mid abdominal drain in place.  Insertion site with old, dried drainage.  Several layers of gauze in place which are removed.  Once cleaned, site appears intact with granulation/scarring around insertion site. No purulence or active drainage.  Stat-lock replaced. Dressing replaced.  Small amount of cloudy, serosanguinous fluid in bulb.   Imaging: No results found.  Labs:  CBC: Recent Labs    02/14/21 0824 02/24/21 1355 02/25/21 1548 03/14/21 0740  WBC 7.8 16.3* 13.7* 15.1*  HGB 13.1 13.3 12.4* 12.7*  HCT 41.4 40.5 37.8* 39.6  PLT 224 242 234 424*    COAGS: Recent Labs    02/14/21 0824  INR 1.0    BMP: Recent Labs    06/30/20 2348 07/29/20 1528 11/21/20 1358 12/25/20 1126 01/04/21 1124 01/05/21 0742 01/29/21 1844 02/24/21 1355 02/25/21 1548  NA 133*   < > 137   < >  --  134* 135 133* 133*  K 2.6*   < > 4.2   < >  --  3.7 3.5 3.8 3.5  CL 92*   < > 99   < >  --  98 98 96 98  CO2 28   < > 25   < >  --  $R'25 27 20 25  'MC$ GLUCOSE 245*   < > 290*   < >  --  259* 254* 304* 369*  BUN 7   < > 8   < >  --  $R'18 11 6 7  'Zs$ CALCIUM 9.3   < > 9.4   < >  --  8.4* 9.1 9.2 9.1  CREATININE 0.87   < > 0.87   < > 1.00 0.82 0.75 0.80 0.84  GFRNONAA >60   < > 109   < > >  60 >60 >60  --  >60  GFRAA >60  --  126  --   --   --   --   --   --    < > = values in this interval not displayed.    LIVER FUNCTION TESTS: Recent Labs    09/26/20 1432 01/29/21 1844 02/24/21 1355 02/25/21 1548  BILITOT 0.5 0.5 0.3 0.3  AST $Re'18 15 9 'RMx$ 14*  ALT $Re'25 16 8 14  'yoe$ ALKPHOS 87 87 103 92  PROT 7.5 7.8 7.6 8.2*  ALBUMIN 4.0 3.9 4.1 3.5    Assessment: Subperitoneal fluid collection related to mesh infection s/p percutaneous drain placement x2, most recent  02/14/21.  Drain remains in place today.  Output appears to have slowed significantly, but continues with a small amount of drainage daily.  CT Abdomen Pelvis obtained and reviewed by Dr. Earleen Newport who notes improvement, but signs of persistent small collection. Although output has slowed, recommend continued drainage for another 2-4 weeks due recurrence in the setting of infected implant with re-imaging in hopes of complete resolve.  May stop flushes and continue to bulb suction for now.  Patient is encouraged to keep all his upcoming appointments and to discuss his hesitations with surgical team.  Patient given appointment date for 2 week follow-up.  He verbalizes understanding.   Signed: Docia Barrier, PA 04/03/2021, 1:09 PM   Please refer to Dr. Pasty Arch attestation of this note for management and plan.

## 2021-04-04 ENCOUNTER — Other Ambulatory Visit: Payer: Medicare Other

## 2021-04-08 NOTE — Patient Instructions (Addendum)
DUE TO COVID-19 ONLY ONE VISITOR IS ALLOWED TO COME WITH YOU AND STAY IN THE WAITING ROOM ONLY DURING PRE OP AND PROCEDURE.   **NO VISITORS ARE ALLOWED IN THE SHORT STAY AREA OR RECOVERY ROOM!!**  IF YOU WILL BE ADMITTED INTO THE HOSPITAL YOU ARE ALLOWED ONLY TWO SUPPORT PEOPLE DURING VISITATION HOURS ONLY (10AM -8PM)   The support person(s) may change daily. The support person(s) must pass our screening, gel in and out, and wear a mask at all times, including in the patient's room. Patients must also wear a mask when staff or their support person are in the room.  No visitors under the age of 74. Any visitor under the age of 42 must be accompanied by an adult.    COVID SWAB TESTING MUST BE COMPLETED ON:  04/11/21 @   [address] [place], [place] [address]   You are not required to quarantine, however you are required to wear a well-fitted mask when you are out and around people not in your household.  Hand Hygiene often Do NOT share personal items Notify your provider if you are in close contact with someone who has COVID or you develop fever 100.4 or greater, new onset of sneezing, cough, sore throat, shortness of breath or body aches.       Your procedure is scheduled on: 04/15/21   Report to [facility] Main  Entrance    Report to admitting at 12:00 PM   Call this number if you have problems the morning of surgery [phone]   Do not eat food :After Midnight.   May have liquids until 11:00 AM day of surgery  CLEAR LIQUID DIET  Foods Allowed                                                                     Foods Excluded  Water, Black Coffee and tea, regular and decaf               liquids that you cannot  Plain Jell-O in any flavor  (No red)                                     see through such as: Fruit ices (not with fruit pulp)                                             milk, soups, orange juice              Iced Popsicles (No red)                                                  All solid food                                   Apple juices Sports drinks like  Gatorade (No red) Lightly seasoned clear broth or consume(fat free) Sugar, honey syrup    Oral Hygiene is also important to reduce your risk of infection.                                    Remember - BRUSH YOUR TEETH THE MORNING OF SURGERY WITH YOUR REGULAR TOOTHPASTE   Do NOT smoke after Midnight   Take these medicines the morning of surgery with A SIP OF WATER: Ciprofloxin, Gabapentin, Metronidazole, Oxycodone, Promethazine.   DO NOT TAKE ANY ORAL DIABETIC MEDICATIONS DAY OF YOUR SURGERY  How to Manage Your Diabetes Before and After Surgery  Why is it important to control my blood sugar before and after surgery? Improving blood sugar levels before and after surgery helps healing and can limit problems. A way of improving blood sugar control is eating a healthy diet by:  Eating less sugar and carbohydrates  Increasing activity/exercise  Talking with your doctor about reaching your blood sugar goals High blood sugars (greater than 180 mg/dL) can raise your risk of infections and slow your recovery, so you will need to focus on controlling your diabetes during the weeks before surgery. Make sure that the doctor who takes care of your diabetes knows about your planned surgery including the date and location.  How do I manage my blood sugar before surgery? Check your blood sugar at least 4 times a day, starting 2 days before surgery, to make sure that the level is not too high or low. Check your blood sugar the morning of your surgery when you wake up and every 2 hours until you get to the Short Stay unit. If your blood sugar is less than 70 mg/dL, you will need to treat for low blood sugar: Do not take insulin. Treat a low blood sugar (less than 70 mg/dL) with  cup of clear juice (cranberry or apple), 4 glucose tablets, OR glucose gel. Recheck blood sugar in 15 minutes after treatment  (to make sure it is greater than 70 mg/dL). If your blood sugar is not greater than 70 mg/dL on recheck, call [phone] for further instructions. Report your blood sugar to the short stay nurse when you get to Short Stay.  If you are admitted to the hospital after surgery: Your blood sugar will be checked by the staff and you will probably be given insulin after surgery (instead of oral diabetes medicines) to make sure you have good blood sugar levels. The goal for blood sugar control after surgery is 80-180 mg/dL.   WHAT DO I DO ABOUT MY DIABETES MEDICATION?  Do not take oral diabetes medicines (pills) the morning of surgery.  THE NIGHT BEFORE SURGERY, take     units of       insulin.       THE MORNING OF SURGERY, take   units of         insulin.  The day of surgery, do not take other diabetes injectables, including Byetta (exenatide), Bydureon (exenatide ER), Victoza (liraglutide), or Trulicity (dulaglutide).  If your CBG is greater than 220 mg/dL, you may take  of your sliding scale  (correction) dose of insulin.   Reviewed and Endorsed by [facility] Patient Education Committee, August 2015  You may not have any metal on your body including jewelry, and body piercing             Do not wear lotions, powders, cologne, or deodorant              Men may shave face and neck.   Do not bring valuables to the hospital. [place].   Contacts, dentures or bridgework may not be worn into surgery.   Bring small overnight bag day of surgery.  Special Instructions: Bring a copy of your healthcare power of attorney and living will documents         the day of surgery if you haven't scanned them in before.  Please read over the following fact sheets you were given: IF YOU HAVE QUESTIONS ABOUT YOUR PRE OP INSTRUCTIONS PLEASE CALL [phone]   [place] - Preparing for Surgery Before surgery, you can play an  important role.  Because skin is not sterile, your skin needs to be as free of germs as possible.  You can reduce the number of germs on your skin by washing with CHG (chlorahexidine gluconate) soap before surgery.  CHG is an antiseptic cleaner which kills germs and bonds with the skin to continue killing germs even after washing. Please DO NOT use if you have an allergy to CHG or antibacterial soaps.  If your skin becomes reddened/irritated stop using the CHG and inform your nurse when you arrive at Short Stay. Do not shave (including legs and underarms) for at least 48 hours prior to the first CHG shower.  You may shave your face/neck.  Please follow these instructions carefully:  1.  Shower with CHG Soap the night before surgery and the  morning of surgery.  2.  If you choose to wash your hair, wash your hair first as usual with your normal  shampoo.  3.  After you shampoo, rinse your hair and body thoroughly to remove the shampoo.                             4.  Use CHG as you would any other liquid soap.  You can apply chg directly to the skin and wash.  Gently with a scrungie or clean washcloth.  5.  Apply the CHG Soap to your body ONLY FROM THE NECK DOWN.   Do   not use on face/ open                           Wound or open sores. Avoid contact with eyes, ears mouth and   genitals (private parts).                       Wash face,  Genitals (private parts) with your normal soap.             6.  Wash thoroughly, paying special attention to the area where your    surgery  will be performed.  7.  Thoroughly rinse your body with warm water from the neck down.  8.  DO NOT shower/wash with your normal soap after using and rinsing off the CHG Soap.                9.  Pat yourself dry with a clean towel.            10.  Wear clean pajamas.            11.  Place clean sheets on your bed the night of your first shower and do not  sleep with pets. Day of Surgery : Do not apply any lotions/deodorants the  morning of surgery.  Please wear clean clothes to the hospital/surgery center.  FAILURE TO FOLLOW THESE INSTRUCTIONS MAY RESULT IN THE CANCELLATION OF YOUR SURGERY  PATIENT SIGNATURE_________________________________  NURSE SIGNATURE__________________________________  ________________________________________________________________________

## 2021-04-09 NOTE — Progress Notes (Signed)
COVID Vaccine Completed: Date COVID Vaccine completed: Has received booster: COVID vaccine manufacturer: [employer]   Date of COVID positive in last 90 days:  PCP - Doristine Mango, DO Cardiologist - Quay Burow, MD.  Last OV 2016  Chest x-ray - 07-01-20 Epic EKG - 06-30-20 Epic Stress Test - 2012 Epic ECHO - 07-05-04 Epic Cardiac Cath -  Pacemaker/ICD device last checked: Spinal Cord Stimulator:  Sleep Study -  CPAP -   Fasting Blood Sugar -  Checks Blood Sugar _____ times a day  Blood Thinner Instructions: Aspirin Instructions: Last Dose:  Activity level:  Can go up a flight of stairs and perform activities of daily living without stopping and without symptoms of chest pain or shortness of breath.   Able to exercise without symptoms  Unable to go up a flight of stairs without symptoms of      Anesthesia review:  Muscular dystrophy, tachycardia and palpitations, uncontrolled DM, OSA  Patient denies shortness of breath, fever, cough and chest pain at PAT appointment   Patient verbalized understanding of instructions that were given to them at the PAT appointment. Patient was also instructed that they will need to review over the PAT instructions again at home before surgery.

## 2021-04-10 ENCOUNTER — Ambulatory Visit (HOSPITAL_COMMUNITY): Payer: Medicare Other | Admitting: Physical Therapy

## 2021-04-10 DIAGNOSIS — K432 Incisional hernia without obstruction or gangrene: Secondary | ICD-10-CM | POA: Diagnosis not present

## 2021-04-11 ENCOUNTER — Encounter (HOSPITAL_COMMUNITY)
Admission: RE | Admit: 2021-04-11 | Discharge: 2021-04-11 | Disposition: A | Discharge status: Home / Self Care | Payer: Medicare Other | Source: Ambulatory Visit | Attending: Anesthesiology | Admitting: Anesthesiology

## 2021-04-11 ENCOUNTER — Other Ambulatory Visit (HOSPITAL_COMMUNITY): Payer: Medicare Other

## 2021-04-12 NOTE — Progress Notes (Signed)
Patient phoned stating that he is canceling procedure on 04/15/21 and he has made the surgeon's office aware.

## 2021-04-12 NOTE — Progress Notes (Signed)
Patient was a no show for PAT appointment on 04-11-21.  Unable to reach patient at number listed in chart.  Spoke to patient's mother who gave a different phone number but patient did not answer that number either and unable to leave a message.   Will continue to try and reach patient

## 2021-04-15 ENCOUNTER — Ambulatory Visit (HOSPITAL_COMMUNITY): Admission: RE | Admit: 2021-04-15 | Payer: Medicare Other | Source: Ambulatory Visit | Admitting: General Surgery

## 2021-04-15 ENCOUNTER — Encounter (HOSPITAL_COMMUNITY): Admission: RE | Payer: Self-pay | Source: Ambulatory Visit

## 2021-04-15 SURGERY — FOREIGN BODY REMOVAL ADULT
Anesthesia: General

## 2021-04-17 ENCOUNTER — Ambulatory Visit
Admission: RE | Admit: 2021-04-17 | Discharge: 2021-04-17 | Disposition: A | Discharge status: Home / Self Care | Payer: Medicare Other | Source: Ambulatory Visit | Attending: General Surgery | Admitting: General Surgery

## 2021-04-17 ENCOUNTER — Other Ambulatory Visit: Payer: Self-pay

## 2021-04-17 ENCOUNTER — Encounter: Payer: Self-pay | Admitting: *Deleted

## 2021-04-17 DIAGNOSIS — L02211 Cutaneous abscess of abdominal wall: Secondary | ICD-10-CM | POA: Diagnosis not present

## 2021-04-17 DIAGNOSIS — Z978 Presence of other specified devices: Secondary | ICD-10-CM | POA: Diagnosis not present

## 2021-04-17 HISTORY — PX: IR RADIOLOGIST EVAL & MGMT: IMG5224

## 2021-04-17 NOTE — Progress Notes (Signed)
Chief Complaint: Patient was seen in consultation today for abdominal wall abscess at the request of Mickeal Skinner  Referring Physician(s): Mickeal Skinner  History of Present Illness: [name] is a 40 y.o. male status post laparoscopic incisional hernia repair with mesh and LOA 01/04/21.   He developed a post-op fluid collection and underwent drain placement 02/14/21 which was removed the following week due to low output and evidence of resolve of the fluid collection, however he developed reaccumulation with concern for infection requiring drain replacement 03/14/21.     [name] returns to IR drain clinic today for evaluation and management of his abdominal drain.  He reports that since his last evaluation his output has completely resolved.  Furthermore, there is no evidence of significant undrained abscess on CT imaging.  He is feeling well and has completed all antibiotics.   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[place])    Bipolar disorder ([place])    On disability   Depression    Diabetes mellitus without complication ([place])    per patient : under control with diet, does not monitor cbg at home    GERD (gastroesophageal reflux disease)    Headache(784.0)    daily headaches   Hyperlipidemia    Neuromuscular disorder ([place])    Beckers muscular dystrophy   Obesity    PTSD (post-traumatic stress disorder)    Sleep apnea    Does not tolerate CPAP    Past Surgical History:  Procedure Laterality Date   APPLICATION OF WOUND VAC N/A 11/23/2018   Procedure: Application Of Wound Vac;  Surgeon: Kinsinger, Arta Bruce, MD;  Location: [facility];  Service: General;  Laterality: N/A;   COLECTOMY N/A 11/23/2018   Procedure: TOTAL COLECTOMY;  Surgeon: Mickeal Skinner, MD;  Location: [facility];  Service: General;  Laterality: N/A;   ILEOSTOMY N/A 11/23/2018   Procedure: ILEOSTOMY;  Surgeon: Kinsinger, Arta Bruce, MD;  Location: [facility];  Service: General;  Laterality: N/A;   ILEOSTOMY CLOSURE N/A 05/19/2019   Procedure: ILEOSTOMY REVERSAL WITH ILEOCOLIC AND COLORECTAL ANASTOMOSIS;  Surgeon: Kieth Brightly, Arta Bruce, MD;  Location: [facility];  Service: General;  Laterality: N/A;   INCISIONAL HERNIA REPAIR N/A 01/04/2021   Procedure: LAPAROSCOPIC INCISIONAL HERNIA REPAIR WITH MESH; LYSIS OF ADHESIONS;  Surgeon: Kieth Brightly, Arta Bruce, MD;  Location: [facility];  Service: General;  Laterality: N/A;   IR GUIDED DRAIN W CATHETER PLACEMENT  02/14/2021   IR RADIOLOGIST EVAL & MGMT  02/19/2021   IR RADIOLOGIST EVAL & MGMT  03/21/2021   IR RADIOLOGIST EVAL & MGMT  04/03/2021   IR RADIOLOGIST EVAL & MGMT  04/17/2021   NM MYOCAR PERF WALL MOTION  01/22/2011   protocol: Persantine, moderate reversible inferior defect post stress EF 48%, high risk scan   TONSILLECTOMY     TRANSTHORACIC ECHOCARDIOGRAM  07/05/2004   EF=>55% normal study     Allergies: Metformin and related, Penicillins, and Nsaids  Medications: Prior to Admission medications   Medication Sig Start Date End Date Taking? Authorizing Provider  Blood Glucose Monitoring Suppl (ONE TOUCH ULTRA 2) w/Device KIT Measure blood sugar daily and with meals for insulin 01/08/21   Anderson, Chelsey L, DO  busPIRone (BUSPAR) 10 MG tablet Take 10 mg by mouth 3 (three) times daily. Patient not taking: No sig reported 10/25/20   [provider]  ciprofloxacin (CIPRO) 500 MG tablet Take 1 tablet (500 mg  total) by mouth 2 (two) times daily. 02/25/21   Carmin Muskrat, MD  diphenhydramine-acetaminophen (TYLENOL PM) 25-500 MG TABS tablet Take 3 tablets by mouth at bedtime as needed (Sleep).    [provider]  Dulaglutide (TRULICITY) 2.77 AJ/2.8NO SOPN Inject 0.75 mg into the skin once a week. Patient not taking: Reported on 02/25/2021 02/06/21   Doristine Mango L, DO  gabapentin (NEURONTIN) 300 MG capsule Take 300 mg by mouth daily as needed (pain). 06/20/16   [provider]   ibuprofen (ADVIL) 800 MG tablet Take 1 tablet (800 mg total) by mouth every 8 (eight) hours as needed. 01/04/21   Kinsinger, Arta Bruce, MD  Insulin Pen Needle 32G X 6 MM MISC 1 packet by Does not apply route 2 (two) times daily. Patient not taking: No sig reported 03/29/20   Anderson, Chelsey L, DO  JARDIANCE 25 MG TABS tablet TAKE ONE TABLET BY MOUTH DAILY. Patient not taking: Reported on 02/25/2021 01/14/21   Doristine Mango L, DO  Lancets (ONETOUCH DELICA PLUS [id number]) MISC USE TO TEST TWO TIMES A DAY 12/21/19   Anderson, Chelsey L, DO  LANTUS SOLOSTAR 100 UNIT/ML Solostar Pen INJECT 40 UNITS INTO THE SKIN EVERY MORNING AS DIRECTED Patient not taking: Reported on 03/14/2021 02/14/21   Doristine Mango L, DO  lurasidone (LATUDA) 40 MG TABS tablet Take 1 tablet (40 mg total) by mouth at bedtime. 01/04/21   Kinsinger, Arta Bruce, MD  Melatonin 1 MG SUBL Place 1 mg under the tongue at bedtime. 02/06/21   Anderson, Chelsey L, DO  metroNIDAZOLE (FLAGYL) 500 MG tablet Take 1 tablet (500 mg total) by mouth 2 (two) times daily. 02/25/21   Carmin Muskrat, MD  ns flush (NS FLUSH) 0.9% SOLN Flush catheter 1 to 2 times daily with 5 to 29ml's of saline Patient not taking: No sig reported 02/14/21   Docia Barrier, PA  omeprazole (PRILOSEC) 20 MG capsule TAKE 2 CAPSULES (40 MG TOTAL) BY MOUTH AT BEDTIME. 03/20/21   Anderson, Chelsey L, DO  ONETOUCH ULTRA test strip USE TO TEST TWO TIMES A DAY 02/19/21   Anderson, Chelsey L, DO  oxyCODONE (OXY IR/ROXICODONE) 5 MG immediate release tablet Take 1-2 tablets (5-10 mg total) by mouth every 4 (four) hours as needed for moderate pain. 01/04/21   Kinsinger, Arta Bruce, MD  oxyCODONE (ROXICODONE) 5 MG immediate release tablet Take 1 tablet (5 mg total) by mouth every 6 (six) hours as needed for up to 12 doses for severe pain. 01/29/21   Wyvonnia Dusky, MD  promethazine (PHENERGAN) 50 MG tablet Take 1 tablet (50 mg total) by mouth every 6 (six) hours as needed for  nausea or vomiting. 02/06/21   Doristine Mango L, DO  traZODone (DESYREL) 100 MG tablet Take 50-100 mg by mouth at bedtime. 12/05/19   [provider]  VYVANSE 20 MG capsule Take 20 mg by mouth daily with breakfast. Take with 50 mg for a total of 70 mg 06/28/18   [provider]  VYVANSE 50 MG capsule Take 50 mg by mouth See admin instructions. Take with 20 mg for a total of 70 mg in the morning 06/28/18   [provider]     Family History  Problem Relation Age of Onset   COPD Mother    Depression Mother    Diabetes Mother    Hyperlipidemia Mother    Asthma Father    Arthritis Father    Diabetes Father    Heart disease Father  Hyperlipidemia Father    Hypertension Father    Hypertension Brother     Social History   Socioeconomic History   Marital status: Married    Spouse name: Not on file   Number of children: 2   Years of education: 12   Highest education level: Not on file  Occupational History    Employer: UNEMPLOYED  Tobacco Use   Smoking status: Every Day    Packs/day: 1.50    Years: 21.00    Pack years: 31.50    Types: Cigarettes   Smokeless tobacco: Never  Vaping Use   Vaping Use: Former   Devices: Increased nicotine cravings  Substance and Sexual Activity   Alcohol use: No    Alcohol/week: 0.0 standard drinks    Comment: quit 2013   Drug use: Not Currently    Types: Marijuana    Comment: 7-27   Sexual activity: Yes    Birth control/protection: None  Other Topics Concern   Not on file  Social History Narrative   Patient lives at home with parents, brother and brothers wife.    Patient is single.    Patient has 2 children.    Patient has a high school education.    Patient is unemployed.    Patient is right handed.    Social Determinants of Health   Financial Resource Strain: Not on file  Food Insecurity: Not on file  Transportation Needs: Not on file  Physical Activity: Not on file  Stress: Not on file  Social  Connections: Not on file    Review of Systems: A 12 point ROS discussed and pertinent positives are indicated in the HPI above.  All other systems are negative.  Review of Systems  Vital Signs: There were no vitals taken for this visit.  Physical Exam Constitutional:      Appearance: Normal appearance.  HENT:     Head: Normocephalic and atraumatic.  Eyes:     General: No scleral icterus. Pulmonary:     Effort: Pulmonary effort is normal.  Abdominal:     General: Abdomen is flat. There is no distension.     Palpations: Abdomen is soft.     Tenderness: There is no abdominal tenderness. There is no guarding.  Skin:    General: Skin is warm and dry.  Neurological:     Mental Status: He is alert and oriented to person, place, and time.  Psychiatric:        Mood and Affect: Mood normal.        Behavior: Behavior normal.      Imaging: CT ABDOMEN PELVIS WO CONTRAST  Result Date: 04/17/2021 CLINICAL DATA:  History of incisional hernia repair complicated by development ventral wall abscess requiring percutaneous drainage catheter placement on 02/14/2021. Patient was subsequently evaluated interventional radiology drain clinic on 02/19/2021 a given scant output from the drainage catheter and clinical improvement in the decision was made to remove the drainage catheter. Unfortunately, the patient return to the emergency department on 02/25/2021 with CT scan the abdomen pelvis demonstrating recurrence of the ventral wall abdominal abscess. A new drainage catheter was placed on 03/14/2021. Patient now doing well clinically with 0 drain output and having completed antibiotics. EXAM: CT ABDOMEN AND PELVIS WITHOUT CONTRAST TECHNIQUE: Multidetector CT imaging of the abdomen and pelvis was performed following the standard protocol without IV contrast. COMPARISON:  Most recent prior CT scan of the abdomen and pelvis 04/03/2021 FINDINGS: Lower chest: No acute abnormality. Hepatobiliary: No focal  liver  abnormality is seen. No gallstones, gallbladder wall thickening, or biliary dilatation. Pancreas: Unremarkable. No pancreatic ductal dilatation or surrounding inflammatory changes. Spleen: Normal in size without focal abnormality. Adrenals/Urinary Tract: Adrenal glands are unremarkable. Kidneys are normal, without renal calculi, focal lesion, or hydronephrosis. Bladder is unremarkable. Stomach/Bowel: Surgical changes of prior right hemicolectomy with patent ileocolonic anastomosis as well as probable sigmoid colectomy with colo colonic anastomosis. No focal bowel wall thickening or evidence of obstruction. Vascular/Lymphatic: Limited evaluation in the absence of intravenous contrast. No evidence of aneurysm or suspicious adenopathy. Atherosclerotic calcifications present along the abdominal aorta. Reproductive: Prostate is unremarkable. Other: Well-positioned drainage catheter in the deep aspect of the right lower quadrant anterior abdominal wall. No measurable residual fluid collection identified. Small amount of phlegmon remains. Musculoskeletal: No acute fracture or aggressive appearing lytic or blastic osseous lesion. IMPRESSION: Interval resolution of abdominal wall abscess. Electronically Signed   By: Jacqulynn Cadet M.D.   On: 04/17/2021 13:22   CT ABDOMEN PELVIS WO CONTRAST  Result Date: 04/03/2021 CLINICAL DATA:  40 year old male with a history pigtail drainage catheter of anterior abdominal wall abscess after hernia repair. Drainage performed 03/14/2021, with no history of fistula. EXAM: CT ABDOMEN AND PELVIS WITHOUT CONTRAST TECHNIQUE: Multidetector CT imaging of the abdomen and pelvis was performed following the standard protocol without IV contrast. COMPARISON:  03/21/2021, 02/25/2021 FINDINGS: Lower chest: No acute finding of the lower chest. Hepatobiliary: Relatively unremarkable appearance of the liver parenchyma. No overt cirrhotic changes on the current CT. Decompressed gallbladder. No  inflammatory changes. Pancreas: Unremarkable Spleen: Unremarkable Adrenals/Urinary Tract: - Right adrenal gland:  Unremarkable - Left adrenal gland: Unremarkable. - Right kidney: Similar appearance of hyperdense appearance in the corticomedullary interface of the right kidney. No hydronephrosis. No nephrolithiasis. No focal lesion. - Left Kidney: Similar appearance of increased density at the corticomedullary interface of the left kidney. No hydronephrosis. Unchanged tiny stone in the superior collecting system a. no focal lesion. - Urinary Bladder: Urinary bladder decompressed Stomach/Bowel: - Stomach: Unremarkable. - Small bowel: Unremarkable - Appendix: Right partial colectomy. - Colon: Surgical changes of the right colon again noted. Surgical changes of the left colon/sigmoid colon again noted. Mild stool burden. No evidence of obstruction. Vascular/Lymphatic: Scattered atherosclerotic changes. No adenopathy. Reproductive: Unremarkable prostate Other: Pigtail drainage catheter remains within the anterior abdominal wall musculature where there is a persisting thin lentiform fluid and gas collection adjacent to the catheter. Overall the size of the abscess has decreased, with the persisting component 6.8 cm x 1.6 cm on image 54 series 2. Decreasing inflammatory changes with small reactive lymph nodes along the epigastric drainage pathway. Ill-defined soft tissue within the anterior abdominal wall extending from the skin to the musculature on image 39 of series 2, similar to the comparison. Musculoskeletal: No acute displaced fracture. IMPRESSION: Unchanged position of pigtail drainage catheter within anterior abdominal wall abscess. The abscess is nearly resolved though with a component persisting measuring 6.8 cm longitudinally and 1.6 cm in depth. There is significant reduction in the degree of inflammatory changes. Surgical changes along the anterior abdomen, with persisting ill-defined soft tissue within the  anterior abdominal wall fat between the skin and musculature, as above, presumably post surgical change/scarring. Redemonstration of relatively hyperdense appearance of the corticomedullary interface of the bilateral kidneys, as previously noted potentially medullary nephrocalcinosis. Aortic Atherosclerosis (ICD10-I70.0). Additional ancillary findings as above. Signed, Dulcy Fanny. Dellia Nims, RPVI [facility] Electronically Signed   By: Corrie Mckusick D.O.   On:  04/03/2021 17:40   CT ABDOMEN PELVIS WO CONTRAST  Result Date: 03/21/2021 CLINICAL DATA:  History of incisional ventral hernia repair complicated by development of a ventral wall abscess requiring percutaneous drainage catheter placement on 02/14/2021 however the drainage catheter was subsequently removed on 02/19/2021 with subsequent abdominal CT performed 02/25/2021 demonstrating recurrence of the ventral wall abscess for which patient underwent repeat percutaneous drainage catheter placement on 03/14/2021. Patient returns to the interventional radiology drainage clinic for drainage catheter evaluation and management. The patient reports little output from the drainage catheter for the past several days, rather he states that he has been draining from his midline abdominal wound. He continues to flush the percutaneous drainage catheter 3 times a day with saline. EXAM: CT ABDOMEN AND PELVIS WITHOUT CONTRAST TECHNIQUE: Multidetector CT imaging of the abdomen and pelvis was performed following the standard protocol without IV contrast. COMPARISON:  CT-guided percutaneous drainage catheter placement-03/14/2021 CT abdomen and pelvis-02/25/2021 FINDINGS: Lower chest: Limited visualization of the lower thorax is negative for focal airspace opacity or pleural effusion. Normal heart size.  No pericardial effusion. Hepatobiliary: There is mild diffuse decreased attenuation of the hepatic parenchyma suggestive  hepatic steatosis. Mild nodularity hepatic contour. Normal noncontrast appearance of the gallbladder given underdistention. No radiopaque gallstones. No ascites. Pancreas: Normal noncontrast appearance of the pancreas. Spleen: Normal noncontrast appearance of the spleen. Adrenals/Urinary Tract: Increased attenuation of the bilateral renal cortices, nonspecific though could be seen in the setting medullary nephrocalcinosis. There is a solitary punctate (2 mm) nonobstructing stone within the superior pole of the left kidney (coronal image 102, series 4). No discrete right-sided renal stones. There is a minimal amount of likely body habitus related symmetric perinephric stranding. No urinary obstruction. Normal noncontrast appearance of the bilateral adrenal glands. Normal noncontrast appearance of the urinary bladder given degree of distention. Stomach/Bowel: Postoperative change of sigmoid colon. Post right hemicolectomy. No evidence of enteric obstruction. No significant hiatal hernia. No pneumoperitoneum, pneumatosis or portal venous gas. Vascular/Lymphatic: Minimal amount of atherosclerotic plaque within a normal caliber abdominal aorta. No bulky retroperitoneal, mesenteric, pelvic or inguinal lymphadenopathy. Reproductive: Dystrophic calcifications within normal sized prostate gland. No free fluid within the pelvic cul-de-sac. Other: Interval reduction/near resolution of large ventral abdominal wall abscess following percutaneous drainage catheter placement with minimal amount of ill-defined fluid and air tracking within the decompressed abscess cavity extending to the ventral abdominal wall wound (representative images 53 and 61, series 2). While measurements are difficult given the irregular shape of the decompressed abscess cavity is estimated to measure approximately 13.1 x 13.2 x 3.7 cm (axial image 52, series 2; sagittal image 120, series 6), previously, at least 15.5 x 10.0 cm prior percutaneous drainage  catheter placement on 03/14/2021. Residual nodular soft tissue and fluid is seen at the location of previous ostomy site (image 56, series 2), without definitive enteric herniation. Musculoskeletal: No acute or aggressive osseous abnormalities. Stigmata of dish within the lower thoracic spine. IMPRESSION: 1. Interval reduction of ventral wall abscess following percutaneous drainage catheter placement with residual air and fluid within the decompressed abscess cavity tracking to the midline abdominal wound. No new definable/drainable fluid collections within abdomen or pelvis. 2. Stable sequela of right hemicolectomy and postoperative change of the sigmoid colon without evidence of enteric obstruction. 3. Suspected hepatic steatosis with early cirrhotic change. Correlation with LFTs is advised. 4. Solitary punctate (2 mm) nonobstructing left-sided renal stone. Increased attenuation of the bilateral renal cortices is nonspecific though could be seen in the setting of medullary nephrocalcinosis.  Clinical correlation is advised. No evidence of urinary obstruction. 5. Aortic Atherosclerosis (ICD10-I70.0). PLAN: Patient subsequently underwent percutaneous drainage catheter injection. Electronically Signed   By: Sandi Mariscal M.D.   On: 03/21/2021 13:30   DG Sinus/Fist Tube Chk-Non GI  Result Date: 03/21/2021 CLINICAL DATA:  History of incisional ventral hernia repair complicated by development of a ventral wall abscess requiring percutaneous drainage catheter placement on 02/14/2021 however the drainage catheter was subsequently removed on 02/19/2021 with subsequent abdominal CT performed 02/25/2021 demonstrating recurrence of the ventral wall abscess for which patient underwent repeat percutaneous drainage catheter placement on 03/14/2021. Patient returns to the interventional radiology drainage clinic for drainage catheter evaluation and management. The patient reports little output from the drainage catheter for the  past several days (previously @ 200 cc/day), rather he states that he expressed a large amount of infection from the inferior aspect of his midline abdominal wound earlier this morning. He continues to flush the percutaneous drainage catheter 3 times a day with saline. CT of the abdomen and pelvis performed earlier today demonstrates interval reduction of ventral wall abscess following percutaneous drainage catheter placement with residual air and fluid within the decompressed abscess cavity tracking to the midline abdominal wound without new definable/drainable fluid collections within abdomen or pelvis. Patient now presents for drainage catheter injection. EXAM: ABSCESS INJECTION COMPARISON:  CT abdomen pelvis-earlier same day; 02/25/2021; CT-guided percutaneous catheter placement-03/14/2021 CONTRAST:  40 cc Omnipaque 300-administered via the existing percutaneous drain. FLUOROSCOPY TIME:  54 seconds (28 mGy) TECHNIQUE: The patient was positioned supine on the fluoroscopy table. A preprocedural spot fluoroscopic image was obtained of the right mid abdomen and the existing percutaneous drainage catheter. Multiple spot fluoroscopic and radiographic images were obtained following the injection of a small amount of contrast via the existing percutaneous drainage catheter. The site of previous skin drainage was marked fluoroscopically with a radiopaque clamp and additional fluoroscopic images were obtained in various obliquities. Near the entirety of the administered contrast was then aspirated and a postprocedural fluoroscopic image was obtained. The drainage catheter was converted from a gravity bag to a JP bulb per patient request. A dressing was applied. The patient tolerated the procedure well without immediate postprocedural complication. FINDINGS: Preprocedural spot fluoroscopic image demonstrates unchanged positioning of percutaneous drainage catheter with end coiled and locked over the right mid hemiabdomen  Contrast injection demonstrates opacification of the residual ventral wall abscess without definitive communication to the intestines or to the site of recent cutaneous drainage. IMPRESSION: Contrast injection demonstrates opacification of the residual ventral wall abscess without evidence of enteric or cutaneous fistula. PLAN: - Given persistent high volume output from the drainage catheter (200 cc per day prior to this weekend with the patient expressing a large amount of purulence earlier today from a midline abdominal wound), we will maintain the percutaneous drainage catheter to external drainage. - Per patient request, drainage catheter was converted from a gravity bag to a JP bulb. - The patient was instructed to only flush the percutaneous catheter if he were to experience catheter malfunction/occlusion, excessive pericatheter leakage or recurrent cutaneous drainage. - The patient was encouraged to maintain diligent records regarding daily drainage catheter output. - The patient will return to the interventional radiology drain clinic in 2 weeks with repeat noncontrast CT of the abdomen and pelvis and drain injection (note, repeat CT will be obtained to help assist with drain management as this is the second drain placed in hopes of percutaneously managing this recurrent abscess). Electronically Signed  By: Sandi Mariscal M.D.   On: 03/21/2021 16:29   IR Radiologist Eval & Mgmt  Result Date: 04/17/2021 Please refer to notes tab for details about interventional procedure. (Op Note)  IR Radiologist Eval & Mgmt  Result Date: 04/03/2021 Please refer to notes tab for details about interventional procedure. (Op Note)  IR Radiologist Eval & Mgmt  Result Date: 03/21/2021 Please refer to notes tab for details about interventional procedure. (Op Note)   Labs:  CBC: Recent Labs    02/14/21 0824 02/24/21 1355 02/25/21 1548 03/14/21 0740  WBC 7.8 16.3* 13.7* 15.1*  HGB 13.1 13.3 12.4* 12.7*  HCT  41.4 40.5 37.8* 39.6  PLT 224 242 234 424*    COAGS: Recent Labs    02/14/21 0824  INR 1.0    BMP: Recent Labs    06/30/20 2348 07/29/20 1528 11/21/20 1358 12/25/20 1126 01/04/21 1124 01/05/21 0742 01/29/21 1844 02/24/21 1355 02/25/21 1548  NA 133*   < > 137   < >  --  134* 135 133* 133*  K 2.6*   < > 4.2   < >  --  3.7 3.5 3.8 3.5  CL 92*   < > 99   < >  --  98 98 96 98  CO2 28   < > 25   < >  --  $R'25 27 20 25  'QA$ GLUCOSE 245*   < > 290*   < >  --  259* 254* 304* 369*  BUN 7   < > 8   < >  --  $R'18 11 6 7  'mf$ CALCIUM 9.3   < > 9.4   < >  --  8.4* 9.1 9.2 9.1  CREATININE 0.87   < > 0.87   < > 1.00 0.82 0.75 0.80 0.84  GFRNONAA >60   < > 109   < > >60 >60 >60  --  >60  GFRAA >60  --  126  --   --   --   --   --   --    < > = values in this interval not displayed.    LIVER FUNCTION TESTS: Recent Labs    09/26/20 1432 01/29/21 1844 02/24/21 1355 02/25/21 1548  BILITOT 0.5 0.5 0.3 0.3  AST $Re'18 15 9 'wjW$ 14*  ALT $Re'25 16 8 14  'uHI$ ALKPHOS 87 87 103 92  PROT 7.5 7.8 7.6 8.2*  ALBUMIN 4.0 3.9 4.1 3.5    TUMOR MARKERS: No results for input(s): AFPTM, CEA, CA199, CHROMGRNA in the last 8760 hours.  Assessment and Plan:  Resolved abdominal wall abscess.  No drain output for the past week.  1.)  Drainage catheter was removed. 2.)  Mr. Sarin understands that if he develops recurrent abscess again, surgical removal of the infected mesh would likely be unavoidable.    Electronically Signed: Criselda Peaches 04/17/2021, 2:36 PM   I spent a total of  15 Minutes in face to face in clinical consultation, greater than 50% of which was counseling/coordinating care for abdominal wall abscess with percutaneous drain in place.

## 2021-05-01 ENCOUNTER — Ambulatory Visit (HOSPITAL_COMMUNITY): Payer: Medicare Other | Attending: General Surgery | Admitting: Physical Therapy

## 2021-05-10 ENCOUNTER — Other Ambulatory Visit: Payer: Self-pay

## 2021-05-10 DIAGNOSIS — K219 Gastro-esophageal reflux disease without esophagitis: Secondary | ICD-10-CM

## 2021-05-13 MED ORDER — OMEPRAZOLE 20 MG PO CPDR: DELAYED_RELEASE_CAPSULE | ORAL | 0 refills | Status: DC

## 2021-05-19 DIAGNOSIS — E119 Type 2 diabetes mellitus without complications: Secondary | ICD-10-CM | POA: Diagnosis not present

## 2021-05-19 DIAGNOSIS — E785 Hyperlipidemia, unspecified: Secondary | ICD-10-CM | POA: Diagnosis not present

## 2021-05-19 DIAGNOSIS — M778 Other enthesopathies, not elsewhere classified: Secondary | ICD-10-CM | POA: Diagnosis not present

## 2021-05-19 DIAGNOSIS — Z880 Allergy status to penicillin: Secondary | ICD-10-CM | POA: Diagnosis not present

## 2021-05-19 DIAGNOSIS — M25521 Pain in right elbow: Secondary | ICD-10-CM | POA: Diagnosis not present

## 2021-05-30 DIAGNOSIS — M7711 Lateral epicondylitis, right elbow: Secondary | ICD-10-CM | POA: Diagnosis not present

## 2021-06-03 ENCOUNTER — Telehealth: Payer: Self-pay

## 2021-06-03 NOTE — Telephone Encounter (Signed)
Patient calls nurse line reporting positive covid test this morning. Patient reports loss of smell, headaches, cough, and congestion. Patient reports symptoms started on 8/12. Conservative measures given to patient and ED precautions given.

## 2021-06-04 ENCOUNTER — Telehealth (INDEPENDENT_AMBULATORY_CARE_PROVIDER_SITE_OTHER): Payer: Medicare Other | Admitting: Family Medicine

## 2021-06-04 ENCOUNTER — Encounter: Payer: Self-pay | Admitting: Family Medicine

## 2021-06-04 DIAGNOSIS — U071 COVID-19: Secondary | ICD-10-CM

## 2021-06-04 MED ORDER — LOPERAMIDE HCL 2 MG PO TABS: 2.0000 mg | ORAL_TABLET | Freq: Four times a day (QID) | ORAL | 0 refills | Status: DC | PRN

## 2021-06-04 MED ORDER — MOLNUPIRAVIR EUA 200MG CAPSULE: 4.0000 | ORAL_CAPSULE | Freq: Two times a day (BID) | ORAL | 0 refills | Status: AC

## 2021-06-04 NOTE — Progress Notes (Signed)
[place] Telemedicine Visit  Patient consented to have virtual visit and was identified by name and date of birth. Method of visit: Video  Encounter participants: Patient: [name] - located at home Provider: Gladys Damme - located at [facility] Others (if applicable): n/a  Chief Complaint: COVID-19 infection  HPI:  Primary symptom: cough Duration: started 8/12 Severity: moderate Associated symptoms: HA, body aches, loss of taste and smell, SOB, diarrhea 4-5 x per day Fever? Tmax?:  Chills, has not checked temperature with thermometer Sick contacts: Son Covid test: positive home test on 8/12 Covid vaccination(s): 2 Moderna shots in July/August 2021 Risk for progression of severe disease: h/o muscular dystrophy, obesity, DM2  Patient reports that he has had some shortness of breath.  He has an albuterol inhaler and has been using this with immediate relief.  He is only had to use it once or twice a day.  No persistent shortness of breath.  ROS: per HPI  Pertinent PMHx: History of muscular dystrophy, obesity, diabetes, bipolar disorder  Exam:  There were no vitals taken for this visit.  Respiratory: Speaking in full sentences, no cough observed  Assessment/Plan:  COVID-19 Patient is at high risk for severe progression of disease given history of muscular dystrophy, obesity, diabetes.  I offered him paxlovid initially, patient agreed, however patient is taking lurasidone.  Offered molnupiravir instead, counseled on side effects, including need to wear condoms during sexual activity for 3 months following administration of the drug.  Discussed symptomatic treatment, as well as return precautions, which patient was able to list.  No signs of respiratory distress.  We will also give loperamide due to multiple bouts of diarrhea.    Time spent during visit with patient: 25 minutes

## 2021-06-04 NOTE — Assessment & Plan Note (Addendum)
Patient is at high risk for severe progression of disease given history of muscular dystrophy, obesity, diabetes.  I offered him paxlovid initially, patient agreed, however patient is taking lurasidone.  Offered molnupiravir instead, counseled on side effects, including need to wear condoms during sexual activity for 3 months following administration of the drug.  Discussed symptomatic treatment, as well as return precautions, which patient was able to list.  No signs of respiratory distress.  We will also give loperamide due to multiple bouts of diarrhea.

## 2021-06-04 NOTE — Patient Instructions (Signed)
I am so sorry you are ill! Current CDC guidelines recommend isolating at home for the first 5 days, then you can go out and do things, but you should wear a mask as long as you have symptoms and definitely through the first 10 days of illness. (adrifza.com.html)  You can return to work when your symptoms are gone or much improved. - take tylenol up to 1000 mg, every 6 hours as needed  - alternate with ibuprofen 600 mg (3 tablets of 200 mg) every 6 hours as needed. You can take tylenol at 6 AM, ibuprofen at 9 AM, tylenol at noon, ibuprofen at 3PM, etc. - Stay well hydrated (drink at least 1 liter of liquid per day) - If you are having trouble breathing or persistent shortness of breath, go to an urgent care or emergency department to be evaluated in person - If you aren't getting better by this time next week, make a follow up appointment   For molnupiravir: take 4 capsules twice a day for 5 days. Wear a condom for 3 months following administration of molnupiravir.

## 2021-06-04 NOTE — Addendum Note (Signed)
Addended by: Bridget Hartshorn on: 06/04/2021 06:01 PM   Modules accepted: Level of Service

## 2021-06-27 DIAGNOSIS — Z79899 Other long term (current) drug therapy: Secondary | ICD-10-CM | POA: Diagnosis not present

## 2021-06-30 NOTE — Progress Notes (Deleted)
    SUBJECTIVE:   CHIEF COMPLAINT / HPI:  [name] is a 40 year old male with a past medical history of hyperlipidemia, headaches, Becker's muscular dystrophy, GERD, type 2 diabetes, PTSD, anxiety/depression who presents for follow up on his Type 2 DM today.    Type 2 Diabetes Mellitus Follow Up Last hemoglobin A1c in March, 9.6.  His medications include Jardiance 25 mg daily, Trulicity 1.60 mg weekly, Lantus 40 units every morning. Reports taking medications as prescribed***. Home glucose readings range ***. Diet ***. Exercises ***. Due for eye exam***. Not on a statin. Not on ACE-I/ARB. Denies symptoms of hypoglycemia, polyuria, polydipsia, numbness extremities, foot ulcers/trauma***.   PERTINENT  PMH / PSH:  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[place])    Bipolar disorder ([place])    On disability   Depression    Diabetes mellitus without complication ([place])    per patient : under control with diet, does not monitor cbg at home    GERD (gastroesophageal reflux disease)    Headache(784.0)    daily headaches   Hyperlipidemia    Neuromuscular disorder ([facility])    Beckers muscular dystrophy   Obesity    PTSD (post-traumatic stress disorder)    Sleep apnea    Does not tolerate CPAP    OBJECTIVE:   There were no vitals taken for this visit. ***  General: NAD, pleasant, able to participate in exam Cardiac: RRR, no murmurs. Respiratory: CTAB, normal effort, No wheezes, rales or rhonchi Abdomen: Bowel sounds present, nontender, nondistended, no hepatosplenomegaly. Extremities: no edema or cyanosis. Foot exam: No deformities, ulcerations, or other skin breakdown on feet bilaterally.  Sensation intact to monofilament and light touch. Vibratory sensation intact. PT and DP pulses intact BL.   Skin: warm and dry, no rashes noted Neuro: alert, no obvious focal deficits Psych: Normal affect and mood  ASSESSMENT/PLAN:   No  problem-specific Assessment & Plan notes found for this encounter.     Sharion Settler, [place]

## 2021-07-01 ENCOUNTER — Telehealth: Payer: Self-pay | Admitting: Family Medicine

## 2021-07-01 ENCOUNTER — Ambulatory Visit: Payer: Medicare Other | Admitting: Family Medicine

## 2021-07-01 NOTE — Telephone Encounter (Signed)
Patient no-showed to appointment this morning. Attempted to reach patient via telephone to discuss but no answer. Patient should call the clinic and reschedule at his earliest convenience.

## 2021-08-09 ENCOUNTER — Telehealth: Payer: Self-pay

## 2021-08-09 DIAGNOSIS — Z9189 Other specified personal risk factors, not elsewhere classified: Secondary | ICD-10-CM

## 2021-08-09 NOTE — Progress Notes (Signed)
[facility])                                            [place] Team                                        Statin Quality Measure Assessment    08/09/2021  [place] June 18, 1981 [id number]  Per review of chart and payor information, this patient has been flagged for non-adherence to the following CMS Quality Measure:   [x]  Statin Use in Persons with Diabetes  []  Statin Use in Persons with Cardiovascular Disease  The 10-year ASCVD risk score (Arnett DK, et al., 2019) is: 20.8%   Values used to calculate the score:     Age: 40 years     Sex: Male     Is Non-Hispanic African American: No     Diabetic: Yes     Tobacco smoker: Yes     Systolic Blood Pressure: 465 mmHg     Is BP treated: No     HDL Cholesterol: 33 mg/dL     Total Cholesterol: 217 mg/dL LDL 124 mg/dL /12/2019  Per pharmacy claims, atorvastatin has not been filled this year. No recent lipid panel. I offered to help patient reschedule his appointment, but he declined. If deemed therapeutically appropriate, please consider statin rechallenge/alternate dosing or exclusion code (if intolerance present) from options provided below.     Patient has canceled or 'no showed' several appointments this year. When I spoke with him, he stated that he had transportation issues but has since fixed his car. However, he reported that he has issues affording gas money d/t reduced disability; patient lives in [place] and has to commute to [place] for appointments. If possible, please consider clinic resources to help afford gas money to the clinic.   Please consider ONE of the following recommendations:   Initiate high intensity statin Atorvastatin 40mg  once daily, #90, 3 refills   Rosuvastatin 20mg  once daily, #90, 3 refills    Initiate moderate intensity          statin with reduced frequency if prior          statin intolerance 1x weekly, #13, 3 refills   2x  weekly, #26, 3 refills   3x weekly, #39, 3 refills    Code for past statin intolerance or other exclusions (required annually)  Drug Induced Myopathy G72.0   Myositis, unspecified M60.9   Rhabdomyolysis M62.82   Prediabetes R73.03   Adverse effect of antihyperlipidemic and antiarteriosclerotic drugs, initial encounter K35.4S5K    Thank you for your time,  Kristeen Miss, [place] Cell: [phone]

## 2021-08-13 ENCOUNTER — Other Ambulatory Visit: Payer: Self-pay | Admitting: Family Medicine

## 2021-08-13 DIAGNOSIS — K219 Gastro-esophageal reflux disease without esophagitis: Secondary | ICD-10-CM

## 2021-08-22 ENCOUNTER — Other Ambulatory Visit: Payer: Self-pay

## 2021-08-22 ENCOUNTER — Ambulatory Visit (INDEPENDENT_AMBULATORY_CARE_PROVIDER_SITE_OTHER): Payer: Medicare Other | Admitting: Family Medicine

## 2021-08-22 VITALS — BP 152/95 | HR 107 | Ht 70.0 in | Wt 232.6 lb

## 2021-08-22 DIAGNOSIS — Z794 Long term (current) use of insulin: Secondary | ICD-10-CM | POA: Diagnosis not present

## 2021-08-22 DIAGNOSIS — E785 Hyperlipidemia, unspecified: Secondary | ICD-10-CM

## 2021-08-22 DIAGNOSIS — M25561 Pain in right knee: Secondary | ICD-10-CM

## 2021-08-22 DIAGNOSIS — F17200 Nicotine dependence, unspecified, uncomplicated: Secondary | ICD-10-CM

## 2021-08-22 DIAGNOSIS — M549 Dorsalgia, unspecified: Secondary | ICD-10-CM

## 2021-08-22 DIAGNOSIS — E1165 Type 2 diabetes mellitus with hyperglycemia: Secondary | ICD-10-CM | POA: Diagnosis not present

## 2021-08-22 DIAGNOSIS — G8929 Other chronic pain: Secondary | ICD-10-CM | POA: Diagnosis not present

## 2021-08-22 DIAGNOSIS — M25562 Pain in left knee: Secondary | ICD-10-CM | POA: Diagnosis not present

## 2021-08-22 LAB — POCT GLYCOSYLATED HEMOGLOBIN (HGB A1C): HbA1c, POC (controlled diabetic range): 12.8 % — AB (ref 0.0–7.0)

## 2021-08-22 MED ORDER — ATORVASTATIN CALCIUM 40 MG PO TABS
40.0000 mg | ORAL_TABLET | Freq: Every day | ORAL | 3 refills | Status: DC
Start: 2021-08-22 — End: 2024-05-05

## 2021-08-22 MED ORDER — VARENICLINE TARTRATE 0.5 MG PO TABS: ORAL_TABLET | ORAL | 0 refills | Status: DC

## 2021-08-22 NOTE — Progress Notes (Signed)
    SUBJECTIVE:   CHIEF COMPLAINT / HPI:   [name] is a 40 yo who presents with his wife for follow up on diabetes and he reports bilateral knee pain. Has chronic knee pain since he was a kid and gets worse as he gets older  Pain worse when he sits down, not as much when wakling. R knee bothers him more than left. Mainly on outside of R knee that occasionally shoots down his R leg. Takes occasional ibuprofren tho he says he isnt supposed to because of his ruptured colon    Diabetes: Last A1c 9.6 eight months ago. States he stopped taking diabetes medication because he was feeling good and thought that was a good number since it was the best it has been in a while, stating it was around 15 at one point.   Has states he has been working out and losing weight. Says he is working on quitting smoking. Down to a half a pack a day for the past 3 months from previous 2 packs a day. Has been using a vape . Been smoking since 68   States he sees surgeon next week for follow up on his hernia repair   OBJECTIVE:   BP (!) 152/95   Pulse (!) 107   Ht $R'5\' 10"'Nd$  (1.778 m)   Wt 232 lb 9.6 oz (105.5 kg)   SpO2 100%   BMI 33.37 kg/m    Physical exam  General: well appearing, NAD Cardiovascular: RRR, no murmurs Lungs: CTAB. Normal WOB Abdomen: soft, non-distended, surgical scars present from surgery. Mildly tender to palpation diffusely Skin: warm, dry. No edema MSK: Knees non erythematous and non edematous without visible lesions or deformities. Tender to palpation along lateral joint line of R knee. Normal strength and ROM.   ASSESSMENT/PLAN:   No problem-specific Assessment & Plan notes found for this encounter.  DM2  A1c 12.8, up from 9.6 eight months ago. Previously on Jardiance $RemoveBefo'25mg'RwHqovhfWKg$ , and Lantus 40 units in the am He reports discontinuing his medications because he was feeling good and he felt his A1c was good. He states he had episodes of hypoglycemia on his insulin. Discussed restarting his  medication and that I would place a referral to speak with our pharmacy team to ensure proper reintroduction of medications.  - started on Lipitor $RemoveBe'40mg'tpWMARHBu$  - pharm referral to restart diabetes medication   Tobacco use disorder  Approx 56 pack year history. Has decreased to 1/2 ppd and interested in help with quitting completely. Discussed options and patient opting for starting Chantix. Declines gum. Discussed side effects and proper dosing: .$RemoveBeforeD'5mg'ToFPORPMLlvnpN$  daily for the first 3 days, followed by .$RemoveBefo'5mg'vCIQIUZExjA$  twice a day and then $Remove'1mg'wqSjRqS$  thereafter for 12 weeks.  - pharmacy referral for further support and follow up with Chantix and smoking cessation  R knee pain  Chronic in nature. TTP along lateral joint pain. Muscular dystrophy likely contributing and potentially arthritis as well. Given chronic nature of knee pain and without recent trauma, will forego imaging at this time. He has followed with Ortho in the past (Dr. Noemi Chapel) and requests another referral. Patient would benefit from PT so will continue to encourage this at follow up  [place] referral per patients approval for assistance with financial resources    F/u with PCP in next 4-6 weeks to discuss medication changes and follow up labs   Shary Key, [place]

## 2021-08-22 NOTE — Patient Instructions (Addendum)
It was great seeing you today!  Today you came in for diabetes follow-up, we are checking your A1c, and will restart your medication accordingly.   I have also prescribed chantix to take .5mg  daily for the first 3 days, followed by .05mg  twice a day and then 1mg  thereafter for 12 weeks.   I am placing a referral to our pharmacy team to ensure you are on the right diabetes medication based on your A1c we obtain today.   I have prescribed Atorvastatin 40mg  daily for your elevated cholesterol and stroke prevention.   I have placed a referral to Ortho who you have seen before per your request for your back and R knee pain.   Please check-out at the front desk before leaving the clinic. Please schedule to see your PCP to follow up on medication changes, but if you need to be seen earlier than that for any new issues we're happy to fit you in, just give [place] a call!  Visit Reminders: - Stop by the pharmacy to pick up your prescriptions  - Continue to work on your healthy eating habits and incorporating exercise into your daily life.   Feel free to call with any questions or concerns at any time, at [phone].   Take care,  Dr. Shary Key [facility]

## 2021-08-24 ENCOUNTER — Encounter: Payer: Self-pay | Admitting: Family Medicine

## 2021-08-29 ENCOUNTER — Telehealth: Payer: Self-pay | Admitting: Pharmacist

## 2021-08-29 NOTE — Telephone Encounter (Signed)
-----   Message from Shary Key, DO sent at 08/24/2021  2:26 PM EDT ----- Regarding: Medication assistance Hi Dr. Valentina Lucks can your team see this patient and assist with restarting his medications for diabetes (he stopped taking everything completely because he felt "good" and thought an a1c of 9.8 was good) but now his a1c is 12 so he agreed to a referral to restart his medication. I also started him on Chantix and would love if you can follow up with him regarding his smoking cessation as well.  Let me know if you have any questions!  Thank you! [place]

## 2021-08-29 NOTE — Telephone Encounter (Signed)
Patient contacted for follow/up of tobacco intake reduction attempt following visit with Dr. Arby Barrette 08/22/2021   Since last contact patient reports smoking less (~ 1 ppd) with use of Chantix (varenicline).  Patient continues to work on intake reduction.  Patient denies any significant side effects from varenicline. .    Discussed with patient blood sugar control.  He states he has a meter and supplies but is not currently checking blood sugars.  He admits that he 'goes to the bathroom multiple times throughout the night"   When asked if he would be willing to come in for a visit to reinitiate diabetes medication, he replied that we was comfortable waiting until his PCP visit 09/23/2021.  I offered him to come to pharmacy clinic at any point and suggested follow-up visit after his PCP visit.   Encourage tobacco intake reduction  Total time with patient call and documentation of interaction: 16 minutes.  F/U Phone call planned: None.   Please consider pharmacy clinic referral following PCP visit.

## 2021-08-30 DIAGNOSIS — F17200 Nicotine dependence, unspecified, uncomplicated: Secondary | ICD-10-CM | POA: Diagnosis not present

## 2021-08-30 DIAGNOSIS — R1030 Lower abdominal pain, unspecified: Secondary | ICD-10-CM | POA: Diagnosis not present

## 2021-08-30 DIAGNOSIS — E1165 Type 2 diabetes mellitus with hyperglycemia: Secondary | ICD-10-CM | POA: Diagnosis not present

## 2021-08-30 DIAGNOSIS — E119 Type 2 diabetes mellitus without complications: Secondary | ICD-10-CM | POA: Diagnosis not present

## 2021-08-30 DIAGNOSIS — R1031 Right lower quadrant pain: Secondary | ICD-10-CM | POA: Diagnosis not present

## 2021-08-30 NOTE — Telephone Encounter (Signed)
I agree with Dr Graylin Shiver plan.

## 2021-09-02 DIAGNOSIS — M25561 Pain in right knee: Secondary | ICD-10-CM | POA: Diagnosis not present

## 2021-09-04 DIAGNOSIS — K760 Fatty (change of) liver, not elsewhere classified: Secondary | ICD-10-CM | POA: Diagnosis not present

## 2021-09-04 DIAGNOSIS — R1030 Lower abdominal pain, unspecified: Secondary | ICD-10-CM | POA: Diagnosis not present

## 2021-09-04 DIAGNOSIS — K5900 Constipation, unspecified: Secondary | ICD-10-CM | POA: Diagnosis not present

## 2021-09-04 DIAGNOSIS — Z880 Allergy status to penicillin: Secondary | ICD-10-CM | POA: Diagnosis not present

## 2021-09-04 DIAGNOSIS — R109 Unspecified abdominal pain: Secondary | ICD-10-CM | POA: Diagnosis not present

## 2021-09-09 DIAGNOSIS — R519 Headache, unspecified: Secondary | ICD-10-CM | POA: Diagnosis not present

## 2021-09-09 DIAGNOSIS — R1011 Right upper quadrant pain: Secondary | ICD-10-CM | POA: Diagnosis not present

## 2021-09-09 DIAGNOSIS — R42 Dizziness and giddiness: Secondary | ICD-10-CM | POA: Diagnosis not present

## 2021-09-09 DIAGNOSIS — R1900 Intra-abdominal and pelvic swelling, mass and lump, unspecified site: Secondary | ICD-10-CM | POA: Diagnosis not present

## 2021-09-09 DIAGNOSIS — Z5321 Procedure and treatment not carried out due to patient leaving prior to being seen by health care provider: Secondary | ICD-10-CM | POA: Diagnosis not present

## 2021-09-10 ENCOUNTER — Encounter (HOSPITAL_COMMUNITY): Payer: Self-pay | Admitting: *Deleted

## 2021-09-10 ENCOUNTER — Emergency Department (HOSPITAL_COMMUNITY): Payer: Medicare Other

## 2021-09-10 ENCOUNTER — Other Ambulatory Visit: Payer: Self-pay

## 2021-09-10 ENCOUNTER — Emergency Department (HOSPITAL_COMMUNITY)
Admission: EM | Admit: 2021-09-10 | Discharge: 2021-09-11 | Disposition: A | Discharge status: Home / Self Care | Payer: Medicare Other | Attending: Emergency Medicine | Admitting: Emergency Medicine

## 2021-09-10 DIAGNOSIS — D72829 Elevated white blood cell count, unspecified: Secondary | ICD-10-CM | POA: Diagnosis not present

## 2021-09-10 DIAGNOSIS — Z794 Long term (current) use of insulin: Secondary | ICD-10-CM | POA: Diagnosis not present

## 2021-09-10 DIAGNOSIS — Z7984 Long term (current) use of oral hypoglycemic drugs: Secondary | ICD-10-CM | POA: Diagnosis not present

## 2021-09-10 DIAGNOSIS — F17210 Nicotine dependence, cigarettes, uncomplicated: Secondary | ICD-10-CM | POA: Diagnosis not present

## 2021-09-10 DIAGNOSIS — R739 Hyperglycemia, unspecified: Secondary | ICD-10-CM

## 2021-09-10 DIAGNOSIS — R1012 Left upper quadrant pain: Secondary | ICD-10-CM | POA: Diagnosis present

## 2021-09-10 DIAGNOSIS — E119 Type 2 diabetes mellitus without complications: Secondary | ICD-10-CM

## 2021-09-10 DIAGNOSIS — E1165 Type 2 diabetes mellitus with hyperglycemia: Secondary | ICD-10-CM | POA: Insufficient documentation

## 2021-09-10 DIAGNOSIS — Z8616 Personal history of COVID-19: Secondary | ICD-10-CM | POA: Diagnosis not present

## 2021-09-10 DIAGNOSIS — R109 Unspecified abdominal pain: Secondary | ICD-10-CM

## 2021-09-10 DIAGNOSIS — K5900 Constipation, unspecified: Secondary | ICD-10-CM | POA: Diagnosis not present

## 2021-09-10 LAB — CBC
HCT: 50.6 % (ref 39.0–52.0)
Hemoglobin: 16.8 g/dL (ref 13.0–17.0)
MCH: 27.9 pg (ref 26.0–34.0)
MCHC: 33.2 g/dL (ref 30.0–36.0)
MCV: 84.1 fL (ref 80.0–100.0)
Platelets: 188 10*3/uL (ref 150–400)
RBC: 6.02 MIL/uL — ABNORMAL HIGH (ref 4.22–5.81)
RDW: 14.3 % (ref 11.5–15.5)
WBC: 16.9 10*3/uL — ABNORMAL HIGH (ref 4.0–10.5)
nRBC: 0 % (ref 0.0–0.2)

## 2021-09-10 LAB — COMPREHENSIVE METABOLIC PANEL
ALT: 40 U/L (ref 0–44)
AST: 26 U/L (ref 15–41)
Albumin: 4.4 g/dL (ref 3.5–5.0)
Alkaline Phosphatase: 103 U/L (ref 38–126)
Anion gap: 12 (ref 5–15)
BUN: 7 mg/dL (ref 6–20)
CO2: 24 mmol/L (ref 22–32)
Calcium: 9.4 mg/dL (ref 8.9–10.3)
Chloride: 93 mmol/L — ABNORMAL LOW (ref 98–111)
Creatinine, Ser: 0.97 mg/dL (ref 0.61–1.24)
GFR, Estimated: >60 mL/min (ref >60)
Glucose, Bld: 547 mg/dL (ref 70–99)
Potassium: 3.1 mmol/L — ABNORMAL LOW (ref 3.5–5.1)
Sodium: 129 mmol/L — ABNORMAL LOW (ref 135–145)
Total Bilirubin: 0.5 mg/dL (ref 0.3–1.2)
Total Protein: 8.1 g/dL (ref 6.5–8.1)

## 2021-09-10 LAB — LIPASE, BLOOD: Lipase: 41 U/L (ref 11–51)

## 2021-09-10 MED ORDER — SODIUM CHLORIDE 0.9 % IV SOLN
1000.0000 mL | INTRAVENOUS | Status: DC
  Administered 2021-09-10: 1000 mL via INTRAVENOUS

## 2021-09-10 MED ORDER — POTASSIUM CHLORIDE CRYS ER 20 MEQ PO TBCR
40.0000 meq | EXTENDED_RELEASE_TABLET | Freq: Once | ORAL | Status: AC
  Administered 2021-09-10: 40 meq via ORAL
  Filled 2021-09-10: qty 2

## 2021-09-10 MED ORDER — INSULIN ASPART 100 UNIT/ML IJ SOLN
10.0000 [IU] | Freq: Once | INTRAMUSCULAR | Status: AC
  Administered 2021-09-10: 10 [IU] via SUBCUTANEOUS
  Filled 2021-09-10: qty 1

## 2021-09-10 MED ORDER — SODIUM CHLORIDE 0.9 % IV BOLUS (SEPSIS)
1000.0000 mL | Freq: Once | INTRAVENOUS | Status: AC
  Administered 2021-09-10: 1000 mL via INTRAVENOUS

## 2021-09-10 NOTE — ED Provider Notes (Signed)
[facility] EMERGENCY DEPARTMENT Provider Note   CSN: [id number] Arrival date & time: 09/10/21  1848     History Chief Complaint  Patient presents with   Abdominal Pain    [name] is a 40 y.o. male.   Abdominal Pain  Patient presents to the ER for evaluation of abdominal pain.  Patient states he has had abdominal pain for months.  He has a complicated surgical history and has had colectomy, ileostomy, and incisional hernias.  Patient states he has been having sharp pain in his upper abdomen and left side off and on for months.  When it increases it affects his breathing.  He has not had any fevers or chills.  He is not have any coughing.  He has had occasional loose stools and some constipation.  No vomiting.  No change in his appetite.  Patient has been seen several times for this in the past.  He has seen his Psychologist, sport and exercise.  He was last evaluated in the emergency room on November 16.  Patient had a CT scan at that time that did not show any acute abnormalities.  Patient also has a history of diabetes.  He has not been taking his medications.  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[place])    Bipolar disorder ([place])    On disability   Depression    Diabetes mellitus without complication ([place])    per patient : under control with diet, does not monitor cbg at home    GERD (gastroesophageal reflux disease)    Headache(784.0)    daily headaches   Hyperlipidemia    Neuromuscular disorder ([place])    Beckers muscular dystrophy   Obesity    PTSD (post-traumatic stress disorder)    Sleep apnea    Does not tolerate CPAP    Patient Active Problem List   Diagnosis Date Noted   COVID-19 06/04/2021   Abdominal wall abscess 03/06/2021   Insomnia 02/06/2021   Incisional hernia 01/04/2021   Ventral hernia 11/21/2020   Anxiety    Shoulder injury, subsequent encounter 06/29/2020   High priority for COVID-19 virus vaccination 03/29/2020    Type 2 diabetes mellitus with hyperglycemia ([place]) 03/01/2020   Abdominal pain 11/22/2018   Memory difficulty 10/04/2018   Erectile dysfunction 01/29/2018   Neck pain 11/12/2016   Back pain 10/25/2016   Allergic rhinitis 04/28/2016   GERD (gastroesophageal reflux disease) 03/21/2013   Elevated BP 04/25/2012   Narcotic abuse ([place]) 06/05/2011   OBESITY 10/17/2010   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   SLEEP APNEA 01/24/2009   BIPOLAR DISORDER UNSPECIFIED 05/05/2008   HYPERLIPIDEMIA 03/30/2008   ABUSE, ALCOHOL, UNSPECIFIED 04/26/2007   TOBACCO ABUSE 12/25/2006    Past Surgical History:  Procedure Laterality Date   APPLICATION OF WOUND VAC N/A 11/23/2018   Procedure: Application Of Wound Vac;  Surgeon: Kieth Brightly Arta Bruce, MD;  Location: [place];  Service: General;  Laterality: N/A;   COLECTOMY N/A 11/23/2018   Procedure: TOTAL COLECTOMY;  Surgeon: Mickeal Skinner, MD;  Location: [place];  Service: General;  Laterality: N/A;   ILEOSTOMY N/A 11/23/2018   Procedure: ILEOSTOMY;  Surgeon: Kinsinger, Arta Bruce, MD;  Location: [place];  Service: General;  Laterality: N/A;   ILEOSTOMY CLOSURE N/A 05/19/2019   Procedure: ILEOSTOMY REVERSAL WITH ILEOCOLIC AND COLORECTAL ANASTOMOSIS;  Surgeon: Kinsinger, Arta Bruce, MD;  Location: [facility];  Service: General;  Laterality: N/A;   [place]  N/A 01/04/2021   Procedure: LAPAROSCOPIC INCISIONAL HERNIA REPAIR WITH MESH; LYSIS OF ADHESIONS;  Surgeon: Kinsinger, Arta Bruce, MD;  Location: [facility];  Service: General;  Laterality: N/A;   IR GUIDED DRAIN W CATHETER PLACEMENT  02/14/2021   IR RADIOLOGIST EVAL & MGMT  02/19/2021   IR RADIOLOGIST EVAL & MGMT  03/21/2021   IR RADIOLOGIST EVAL & MGMT  04/03/2021   IR RADIOLOGIST EVAL & MGMT  04/17/2021   NM MYOCAR PERF WALL MOTION  01/22/2011   protocol: Persantine, moderate reversible inferior defect post stress EF 48%, high risk scan   TONSILLECTOMY     TRANSTHORACIC ECHOCARDIOGRAM  07/05/2004   EF=>55% normal  study        Family History  Problem Relation Age of Onset   COPD Mother    Depression Mother    Diabetes Mother    Hyperlipidemia Mother    Asthma Father    Arthritis Father    Diabetes Father    Heart disease Father    Hyperlipidemia Father    Hypertension Father    Hypertension Brother     Social History   Tobacco Use   Smoking status: Every Day    Packs/day: 1.50    Years: 21.00    Pack years: 31.50    Types: Cigarettes   Smokeless tobacco: Never  Vaping Use   Vaping Use: Former   Devices: Increased nicotine cravings  Substance Use Topics   Alcohol use: No    Alcohol/week: 0.0 standard drinks    Comment: quit 2013   Drug use: Not Currently    Types: Marijuana    Comment: 7-27    Home Medications Prior to Admission medications   Medication Sig Start Date End Date Taking? Authorizing Provider  atorvastatin (LIPITOR) 40 MG tablet Take 1 tablet (40 mg total) by mouth daily. 08/22/21   Shary Key, DO  Blood Glucose Monitoring Suppl (ONE TOUCH ULTRA 2) w/Device KIT Measure blood sugar daily and with meals for insulin 01/08/21   [name], MD  busPIRone (BUSPAR) 10 MG tablet Take 10 mg by mouth 3 (three) times daily. Patient not taking: No sig reported 10/25/20   [provider]  ciprofloxacin (CIPRO) 500 MG tablet Take 1 tablet (500 mg total) by mouth 2 (two) times daily. Patient not taking: Reported on 08/22/2021 02/25/21   Carmin Muskrat, MD  diphenhydramine-acetaminophen (TYLENOL PM) 25-500 MG TABS tablet Take 3 tablets by mouth at bedtime as needed (Sleep). Patient not taking: Reported on 08/22/2021    [provider]  Dulaglutide (TRULICITY) 8.88 BV/6.9IH SOPN Inject 0.75 mg into the skin once a week. Patient not taking: No sig reported 02/06/21   [name], MD  gabapentin (NEURONTIN) 300 MG capsule Take 300 mg by mouth daily as needed (pain). Patient not taking: Reported on 08/22/2021 06/20/16   [provider]   ibuprofen (ADVIL) 800 MG tablet Take 1 tablet (800 mg total) by mouth every 8 (eight) hours as needed. 01/04/21   Kinsinger, Arta Bruce, MD  Insulin Pen Needle 32G X 6 MM MISC 1 packet by Does not apply route 2 (two) times daily. Patient not taking: No sig reported 03/29/20   [name], MD  JARDIANCE 25 MG TABS tablet TAKE ONE TABLET BY MOUTH DAILY. Patient not taking: No sig reported 01/14/21   [name], MD  Lancets (ONETOUCH DELICA PLUS [id number]) Yettem USE TO TEST TWO TIMES A DAY 12/21/19   [name], MD  LANTUS SOLOSTAR 100 UNIT/ML Solostar Pen INJECT 40 UNITS INTO THE SKIN EVERY MORNING AS DIRECTED Patient not taking: Reported on 03/14/2021 02/14/21   [name], MD  loperamide (IMODIUM A-D) 2 MG tablet Take 1 tablet (2 mg total) by mouth 4 (four) times daily as needed for diarrhea or loose stools. 06/04/21   Gladys Damme, MD  lurasidone (LATUDA) 40 MG TABS tablet Take 1 tablet (40 mg total) by mouth at bedtime. 01/04/21   Kinsinger, Arta Bruce, MD  Melatonin 1 MG SUBL Place 1 mg under the tongue at bedtime. Patient not taking: Reported on 08/22/2021 02/06/21   [name], MD  metroNIDAZOLE (FLAGYL) 500 MG tablet Take 1 tablet (500 mg total) by mouth 2 (two) times daily. Patient not taking: Reported on 08/22/2021 02/25/21   Carmin Muskrat, MD  ns flush (NS FLUSH) 0.9% SOLN Flush catheter 1 to 2 times daily with 5 to 49m's of saline Patient not taking: No sig reported 02/14/21   MDocia Barrier PA  omeprazole (PRILOSEC) 20 MG capsule TAKE 2 CAPSULES (40 MG TOTAL) BY MOUTH AT BEDTIME. 08/13/21   ESharion Settler DO  ONETOUCH ULTRA test strip USE TO TEST TWO TIMES A DAY 02/19/21   ARicharda Osmond MD  oxyCODONE (OXY IR/ROXICODONE) 5 MG immediate release tablet Take 1-2 tablets (5-10 mg total) by mouth every 4 (four) hours as needed for moderate pain. Patient not taking: Reported on 08/22/2021 01/04/21   Kinsinger, LArta Bruce MD   oxyCODONE (ROXICODONE) 5 MG immediate release tablet Take 1 tablet (5 mg total) by mouth every 6 (six) hours as needed for up to 12 doses for severe pain. Patient not taking: Reported on 08/22/2021 01/29/21   TWyvonnia Dusky MD  promethazine (PHENERGAN) 50 MG tablet Take 1 tablet (50 mg total) by mouth every 6 (six) hours as needed for nausea or vomiting. Patient not taking: Reported on 08/22/2021 02/06/21   ARicharda Osmond MD  traZODone (DESYREL) 100 MG tablet Take 50-100 mg by mouth at bedtime. 12/05/19   [provider]  varenicline (CHANTIX) 0.5 MG tablet 0.572monce daily for 3 days, then .75m36mwice daily for 4 days, followed by 1mg23mice daily thereafter for 12 weeks 08/22/21   PaigShary Key  VYVANSE 20 MG capsule Take 20 mg by mouth daily with breakfast. Take with 50 mg for a total of 70 mg 06/28/18   [provider]  VYVANSE 50 MG capsule Take 50 mg by mouth See admin instructions. Take with 20 mg for a total of 70 mg in the morning 06/28/18   [provider]    Allergies    Metformin and related, Penicillins, and Nsaids  Review of Systems   Review of Systems  Gastrointestinal:  Positive for abdominal pain.  All other systems reviewed and are negative.  Physical Exam Updated Vital Signs BP 136/90   Pulse (!) 108   Temp 98.8 F (37.1 C) (Oral)   Resp 18   Ht 1.778 m (5' 10")   Wt 105.2 kg   SpO2 96%   BMI 33.29 kg/m   Physical Exam Vitals and nursing note reviewed.  Constitutional:      General: He is not in acute distress.    Appearance: He is well-developed.  HENT:     Head: Normocephalic and atraumatic.     Right Ear: External ear normal.     Left Ear: External ear normal.  Eyes:     General: No scleral  icterus.       Right eye: No discharge.        Left eye: No discharge.     Conjunctiva/sclera: Conjunctivae normal.  Neck:     Trachea: No tracheal deviation.  Cardiovascular:     Rate and Rhythm: Normal rate and regular  rhythm.  Pulmonary:     Effort: Pulmonary effort is normal. No respiratory distress.     Breath sounds: Normal breath sounds. No stridor. No wheezing or rales.  Abdominal:     General: Bowel sounds are normal. There is no distension.     Palpations: Abdomen is soft.     Tenderness: There is no abdominal tenderness. There is no guarding or rebound.     Hernia: No hernia is present.     Comments: Multiple well-healed abdominal scars, no mass appreciated, no guarding, no peritoneal signs  Musculoskeletal:        General: No tenderness or deformity.     Cervical back: Neck supple.  Skin:    General: Skin is warm and dry.     Findings: No rash.  Neurological:     General: No focal deficit present.     Mental Status: He is alert.     Cranial Nerves: No cranial nerve deficit (no facial droop, extraocular movements intact, no slurred speech).     Sensory: No sensory deficit.     Motor: No abnormal muscle tone or seizure activity.     Coordination: Coordination normal.  Psychiatric:        Mood and Affect: Mood normal.    ED Results / Procedures / Treatments   Labs (all labs ordered are listed, but only abnormal results are displayed) Labs Reviewed  COMPREHENSIVE METABOLIC PANEL - Abnormal; Notable for the following components:      Result Value   Sodium 129 (*)    Potassium 3.1 (*)    Chloride 93 (*)    Glucose, Bld 547 (*)    All other components within normal limits  CBC - Abnormal; Notable for the following components:   WBC 16.9 (*)    RBC 6.02 (*)    All other components within normal limits  LIPASE, BLOOD  URINALYSIS, ROUTINE W REFLEX MICROSCOPIC    EKG None  Radiology No results found.  Procedures Procedures   Medications Ordered in ED Medications  sodium chloride 0.9 % bolus 1,000 mL (0 mLs Intravenous Stopped 09/11/21 0001)    Followed by  0.9 %  sodium chloride infusion (1,000 mLs Intravenous New Bag/Given 09/10/21 2304)  insulin aspart (novoLOG)  injection 10 Units (10 Units Subcutaneous Given 09/10/21 2301)  potassium chloride SA (KLOR-CON) CR tablet 40 mEq (40 mEq Oral Given 09/10/21 2310)    ED Course  I have reviewed the triage vital signs and the nursing notes.  Pertinent labs & imaging results that were available during my care of the patient were reviewed by me and considered in my medical decision making (see chart for details).    MDM Rules/Calculators/A&P                           Patient presents with persistent abdominal pain since August.  Outside records reviewed and patient was evaluated at 2 other ED's earlier this month.  He was last seen on November 16 and had an abdominal CT scan that did not show any acute findings.  Patient does not have any vomiting.  He has not had any fevers.  I doubt obstruction no signs of pancreatitis or hepatitis on his laboratory test.  He does have a leukocytosis but that is chronically elevated and unchanged.  Patient however is hyperglycemic without signs of acidosis.  He has not been taking his diabetic medications.  He did see his primary doctor and was supposed to start taking insulin and Jardiance again.  We will treat him with IV fluids and insulin and see if we can decrease his blood sugar.  Care turned over to Dr Juanetta Gosling Final Clinical Impression(s) / ED Diagnoses Hyperglycemia    Dorie Rank, MD 09/11/21 [phone]

## 2021-09-10 NOTE — ED Triage Notes (Signed)
Pt with left upper abd pain that is sharp per pt .  Denies any fever . + nausea

## 2021-09-10 NOTE — ED Notes (Signed)
Critical value- glucose 547 reported to edp j knapp at this time.

## 2021-09-11 ENCOUNTER — Telehealth: Payer: Self-pay | Admitting: Family Medicine

## 2021-09-11 ENCOUNTER — Other Ambulatory Visit: Payer: Self-pay | Admitting: Family Medicine

## 2021-09-11 DIAGNOSIS — E1165 Type 2 diabetes mellitus with hyperglycemia: Secondary | ICD-10-CM | POA: Diagnosis not present

## 2021-09-11 DIAGNOSIS — K5900 Constipation, unspecified: Secondary | ICD-10-CM | POA: Diagnosis not present

## 2021-09-11 DIAGNOSIS — R109 Unspecified abdominal pain: Secondary | ICD-10-CM | POA: Diagnosis not present

## 2021-09-11 LAB — URINALYSIS, ROUTINE W REFLEX MICROSCOPIC
Bacteria, UA: NONE SEEN
Bilirubin Urine: NEGATIVE
Glucose, UA: >=500 mg/dL — AB
Hgb urine dipstick: NEGATIVE
Ketones, ur: NEGATIVE mg/dL
Leukocytes,Ua: NEGATIVE
Nitrite: NEGATIVE
Protein, ur: NEGATIVE mg/dL
Specific Gravity, Urine: 1.039 — ABNORMAL HIGH (ref 1.005–1.030)
pH: 6 (ref 5.0–8.0)

## 2021-09-11 LAB — CBG MONITORING, ED: Glucose-Capillary: 281 mg/dL — ABNORMAL HIGH (ref 70–99)

## 2021-09-11 MED ORDER — EMPAGLIFLOZIN 25 MG PO TABS: 25.0000 mg | ORAL_TABLET | Freq: Every day | ORAL | 3 refills | Status: DC

## 2021-09-11 MED ORDER — LANTUS SOLOSTAR 100 UNIT/ML ~~LOC~~ SOPN: PEN_INJECTOR | SUBCUTANEOUS | 0 refills | Status: DC

## 2021-09-11 MED ORDER — INSULIN PEN NEEDLE 32G X 6 MM MISC: 1.0000 | Freq: Two times a day (BID) | 4 refills | Status: DC

## 2021-09-11 NOTE — Discharge Instructions (Addendum)
Take your insulin and diabetes medications as prescribed. You declined CT scan today. Follow-up with your primary doctor. Return to the ED with new or worsening symptoms

## 2021-09-11 NOTE — ED Provider Notes (Signed)
Care assumed from Dr. Tomi Bamberger.  Patient here with chronic abdominal pain and multiple previous surgeries.  Hyperglycemia without DKA.  Patient feels improved after IV fluids and IV insulin.  No evidence of DKA.  Acute abdominal series negative for obstruction.  CT scan 5 days ago did not show any acute pathology.  Has chronic leukocytosis.  He feels improved and wants to go home.  He is tolerating p.o.  His abdomen is soft.  States the pain is resolved.  He does not want another CT scan.  States he is out of his Lantus as well as Jardiance and it was not refilled by his doctor. Prescriptions will be provided  He is anxious to leave and states he feels better has had no abdominal pain currently.  Follow-up with his doctor.  Return precautions were discussed   Ezequiel Essex, MD 09/11/21 0425

## 2021-09-11 NOTE — Telephone Encounter (Signed)
**  After Hours/ Emergency Line Call**  Received a call from Jacquelyn Antony mother to report that [name] needs a refill on his insulin pen needles. He was seen in the ED yesterday for hyperglycemia and they had refilled his medications but she states they did not refill his needles. She states that he is doing better than yesterday but still having some high sugars. Insulin pen needles were refilled to his preferred pharmacy.   Sharion Settler, DO PGY-2, [facility] 09/11/2021 7:41 PM

## 2021-09-17 DIAGNOSIS — R109 Unspecified abdominal pain: Secondary | ICD-10-CM | POA: Diagnosis not present

## 2021-09-17 DIAGNOSIS — Z9289 Personal history of other medical treatment: Secondary | ICD-10-CM | POA: Diagnosis not present

## 2021-09-17 DIAGNOSIS — Z79899 Other long term (current) drug therapy: Secondary | ICD-10-CM | POA: Diagnosis not present

## 2021-09-17 DIAGNOSIS — E876 Hypokalemia: Secondary | ICD-10-CM | POA: Diagnosis not present

## 2021-09-17 DIAGNOSIS — Z7689 Persons encountering health services in other specified circumstances: Secondary | ICD-10-CM | POA: Diagnosis not present

## 2021-09-17 DIAGNOSIS — H6691 Otitis media, unspecified, right ear: Secondary | ICD-10-CM | POA: Diagnosis not present

## 2021-09-17 DIAGNOSIS — E785 Hyperlipidemia, unspecified: Secondary | ICD-10-CM | POA: Diagnosis not present

## 2021-09-17 DIAGNOSIS — F17200 Nicotine dependence, unspecified, uncomplicated: Secondary | ICD-10-CM | POA: Diagnosis not present

## 2021-09-17 DIAGNOSIS — E119 Type 2 diabetes mellitus without complications: Secondary | ICD-10-CM | POA: Diagnosis not present

## 2021-09-18 LAB — LIPID PANEL
LDL Cholesterol: 91
Triglycerides: 433 — AB (ref 40–160)

## 2021-09-18 LAB — VITAMIN D 25 HYDROXY (VIT D DEFICIENCY, FRACTURES): Vit D, 25-Hydroxy: 29

## 2021-09-18 LAB — BASIC METABOLIC PANEL
BUN: 9 (ref 4–21)
Chloride: 96 — AB (ref 99–108)
Creatinine: 0.9 (ref 0.6–1.3)
Glucose: 312

## 2021-09-18 LAB — MICROALBUMIN, URINE: Microalb, Ur: 0.7

## 2021-09-18 LAB — TSH: TSH: 1.49 (ref 0.41–5.90)

## 2021-09-18 LAB — HEMOGLOBIN A1C: Hemoglobin A1C: 13.9

## 2021-09-23 ENCOUNTER — Ambulatory Visit: Payer: Medicare Other | Admitting: Family Medicine

## 2021-10-08 ENCOUNTER — Other Ambulatory Visit: Payer: Self-pay

## 2021-10-08 ENCOUNTER — Encounter: Payer: Self-pay | Admitting: Nurse Practitioner

## 2021-10-08 ENCOUNTER — Ambulatory Visit (INDEPENDENT_AMBULATORY_CARE_PROVIDER_SITE_OTHER): Payer: Medicare Other | Admitting: Nurse Practitioner

## 2021-10-08 VITALS — BP 107/69 | HR 92 | Ht 67.0 in | Wt 225.6 lb

## 2021-10-08 DIAGNOSIS — E782 Mixed hyperlipidemia: Secondary | ICD-10-CM

## 2021-10-08 DIAGNOSIS — E119 Type 2 diabetes mellitus without complications: Secondary | ICD-10-CM

## 2021-10-08 DIAGNOSIS — Z794 Long term (current) use of insulin: Secondary | ICD-10-CM

## 2021-10-08 DIAGNOSIS — E1165 Type 2 diabetes mellitus with hyperglycemia: Secondary | ICD-10-CM | POA: Diagnosis not present

## 2021-10-08 DIAGNOSIS — E559 Vitamin D deficiency, unspecified: Secondary | ICD-10-CM

## 2021-10-08 MED ORDER — LANCETS 28G MISC: 11 refills | Status: DC

## 2021-10-08 MED ORDER — CONTOUR NEXT TEST VI STRP: ORAL_STRIP | 12 refills | Status: DC

## 2021-10-08 MED ORDER — LANTUS SOLOSTAR 100 UNIT/ML ~~LOC~~ SOPN: 40.0000 [IU] | PEN_INJECTOR | Freq: Every day | SUBCUTANEOUS | 0 refills | Status: DC

## 2021-10-08 NOTE — Progress Notes (Signed)
Endocrinology Consult Note       10/08/2021, 11:52 AM   Subjective:    Patient ID: [name], male    DOB: 10-13-1981.  KIMM UNGARO is being seen in consultation for management of currently uncontrolled symptomatic diabetes requested by  Sharion Settler, DO.   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[place])    Bipolar disorder ([place])    On disability   Depression    Diabetes mellitus without complication ([place])    per patient : under control with diet, does not monitor cbg at home    GERD (gastroesophageal reflux disease)    Headache(784.0)    daily headaches   Hyperlipidemia    Neuromuscular disorder ([place])    Beckers muscular dystrophy   Obesity    PTSD (post-traumatic stress disorder)    Sleep apnea    Does not tolerate CPAP    Past Surgical History:  Procedure Laterality Date   APPLICATION OF WOUND VAC N/A 11/23/2018   Procedure: Application Of Wound Vac;  Surgeon: Kinsinger, Arta Bruce, MD;  Location: [facility] [facility];  Service: General;  Laterality: N/A;   COLECTOMY N/A 11/23/2018   Procedure: TOTAL COLECTOMY;  Surgeon: Mickeal Skinner, MD;  Location: [facility];  Service: General;  Laterality: N/A;   ILEOSTOMY N/A 11/23/2018   Procedure: ILEOSTOMY;  Surgeon: Kinsinger, Arta Bruce, MD;  Location: [place] [facility];  Service: General;  Laterality: N/A;   ILEOSTOMY CLOSURE N/A 05/19/2019   Procedure: ILEOSTOMY REVERSAL WITH ILEOCOLIC AND COLORECTAL ANASTOMOSIS;  Surgeon: Kinsinger, Arta Bruce, MD;  Location: [facility];  Service: General;  Laterality: N/A;   INCISIONAL HERNIA REPAIR N/A 01/04/2021   Procedure: LAPAROSCOPIC INCISIONAL HERNIA REPAIR WITH MESH; LYSIS OF ADHESIONS;  Surgeon: Kieth Brightly, Arta Bruce, MD;  Location: [facility];  Service: General;  Laterality: N/A;   IR GUIDED DRAIN W CATHETER PLACEMENT  02/14/2021   IR RADIOLOGIST EVAL & MGMT  02/19/2021   IR RADIOLOGIST EVAL  & MGMT  03/21/2021   IR RADIOLOGIST EVAL & MGMT  04/03/2021   IR RADIOLOGIST EVAL & MGMT  04/17/2021   NM MYOCAR PERF WALL MOTION  01/22/2011   protocol: Persantine, moderate reversible inferior defect post stress EF 48%, high risk scan   TONSILLECTOMY     TRANSTHORACIC ECHOCARDIOGRAM  07/05/2004   EF=>55% normal study     Social History   Socioeconomic History   Marital status: Married    Spouse name: Not on file   Number of children: 2   Years of education: 12   Highest education level: Not on file  Occupational History    Employer: UNEMPLOYED  Tobacco Use   Smoking status: Every Day    Packs/day: 1.50    Years: 21.00    Pack years: 31.50    Types: Cigarettes   Smokeless tobacco: Never  Vaping Use   Vaping Use: Former   Devices: Increased nicotine cravings  Substance and Sexual Activity   Alcohol use: No    Alcohol/week: 0.0 standard drinks    Comment: quit 2013   Drug use: Not Currently    Types: Marijuana  Comment: 7-27   Sexual activity: Yes    Birth control/protection: None  Other Topics Concern   Not on file  Social History Narrative   Patient lives at home with parents, brother and brothers wife.    Patient is single.    Patient has 2 children.    Patient has a high school education.    Patient is unemployed.    Patient is right handed.    Social Determinants of Health   Financial Resource Strain: Not on file  Food Insecurity: Not on file  Transportation Needs: Not on file  Physical Activity: Not on file  Stress: Not on file  Social Connections: Not on file    Family History  Problem Relation Age of Onset   COPD Mother    Depression Mother    Diabetes Mother    Hyperlipidemia Mother    Asthma Father    Arthritis Father    Diabetes Father    Heart disease Father    Hyperlipidemia Father    Hypertension Father    Hypertension Brother     Outpatient Encounter Medications as of 10/08/2021  Medication Sig   atorvastatin (LIPITOR) 40 MG  tablet Take 1 tablet (40 mg total) by mouth daily.   Blood Glucose Monitoring Suppl (ONE TOUCH ULTRA 2) w/Device KIT Measure blood sugar daily and with meals for insulin   cloNIDine HCl (KAPVAY) 0.1 MG TB12 ER tablet Take 0.1 mg by mouth daily.   diphenhydramine-acetaminophen (TYLENOL PM) 25-500 MG TABS tablet Take 3 tablets by mouth at bedtime as needed (Sleep).   glucose blood (CONTOUR NEXT TEST) test strip Use as instructed to monitor glucose 4 times daily   Insulin Pen Needle 32G X 6 MM MISC 1 packet by Does not apply route 2 (two) times daily.   Lancets 28G MISC Use to monitor glucose 4 times daily   LATUDA 60 MG TABS SMARTSIG:1 Tablet(s) By Mouth Every Evening   loperamide (IMODIUM A-D) 2 MG tablet Take 1 tablet (2 mg total) by mouth 4 (four) times daily as needed for diarrhea or loose stools.   LORazepam (ATIVAN) 1 MG tablet Take 0.5-1 mg by mouth daily as needed.   omeprazole (PRILOSEC) 20 MG capsule TAKE 2 CAPSULES (40 MG TOTAL) BY MOUTH AT BEDTIME.   tiZANidine (ZANAFLEX) 4 MG tablet Take 4 mg by mouth 2 (two) times daily as needed.   traZODone (DESYREL) 100 MG tablet Take 50-100 mg by mouth at bedtime.   varenicline (CHANTIX PAK) 0.5 MG X 11 & 1 MG X 42 tablet Take by mouth.   VYVANSE 20 MG capsule Take 20 mg by mouth daily with breakfast. Take with 50 mg for a total of 70 mg   VYVANSE 50 MG capsule Take 50 mg by mouth See admin instructions. Take with 20 mg for a total of 70 mg in the morning   [DISCONTINUED] empagliflozin (JARDIANCE) 25 MG TABS tablet Take 1 tablet (25 mg total) by mouth daily.   [DISCONTINUED] insulin glargine (LANTUS SOLOSTAR) 100 UNIT/ML Solostar Pen INJECT 40 UNITS INTO THE SKIN EVERY MORNING AS DIRECTED   [DISCONTINUED] Lancets (ONETOUCH DELICA PLUS [id number]) MISC USE TO TEST TWO TIMES A DAY   [DISCONTINUED] ONETOUCH ULTRA test strip USE TO TEST TWO TIMES A DAY   insulin glargine (LANTUS SOLOSTAR) 100 UNIT/ML Solostar Pen Inject 40 Units into the skin at  bedtime. INJECT 40 UNITS INTO THE SKIN EVERY MORNING AS DIRECTED   [DISCONTINUED] busPIRone (BUSPAR) 10 MG tablet Take 10  mg by mouth 3 (three) times daily. (Patient not taking: Reported on 02/25/2021)   [DISCONTINUED] ciprofloxacin (CIPRO) 500 MG tablet Take 1 tablet (500 mg total) by mouth 2 (two) times daily. (Patient not taking: Reported on 08/22/2021)   [DISCONTINUED] Dulaglutide (TRULICITY) 0.53 ZJ/6.7HA SOPN Inject 0.75 mg into the skin once a week. (Patient not taking: Reported on 02/25/2021)   [DISCONTINUED] gabapentin (NEURONTIN) 300 MG capsule Take 300 mg by mouth daily as needed (pain). (Patient not taking: Reported on 08/22/2021)   [DISCONTINUED] ibuprofen (ADVIL) 800 MG tablet Take 1 tablet (800 mg total) by mouth every 8 (eight) hours as needed. (Patient not taking: Reported on 10/08/2021)   [DISCONTINUED] lurasidone (LATUDA) 40 MG TABS tablet Take 1 tablet (40 mg total) by mouth at bedtime. (Patient not taking: Reported on 10/08/2021)   [DISCONTINUED] Melatonin 1 MG SUBL Place 1 mg under the tongue at bedtime. (Patient not taking: Reported on 08/22/2021)   [DISCONTINUED] metroNIDAZOLE (FLAGYL) 500 MG tablet Take 1 tablet (500 mg total) by mouth 2 (two) times daily. (Patient not taking: Reported on 08/22/2021)   [DISCONTINUED] ns flush (NS FLUSH) 0.9% SOLN Flush catheter 1 to 2 times daily with 5 to 61ml's of saline (Patient not taking: Reported on 02/25/2021)   [DISCONTINUED] oxyCODONE (OXY IR/ROXICODONE) 5 MG immediate release tablet Take 1-2 tablets (5-10 mg total) by mouth every 4 (four) hours as needed for moderate pain. (Patient not taking: Reported on 08/22/2021)   [DISCONTINUED] oxyCODONE (ROXICODONE) 5 MG immediate release tablet Take 1 tablet (5 mg total) by mouth every 6 (six) hours as needed for up to 12 doses for severe pain. (Patient not taking: Reported on 08/22/2021)   [DISCONTINUED] promethazine (PHENERGAN) 50 MG tablet Take 1 tablet (50 mg total) by mouth every 6 (six) hours as  needed for nausea or vomiting. (Patient not taking: Reported on 08/22/2021)   [DISCONTINUED] ramelteon (ROZEREM) 8 MG tablet SMARTSIG:1 Tablet(s) By Mouth Every Evening (Patient not taking: Reported on 10/08/2021)   [DISCONTINUED] topiramate (TOPAMAX) 50 MG tablet SMARTSIG:1 Tablet(s) By Mouth Every Evening (Patient not taking: Reported on 10/08/2021)   [DISCONTINUED] varenicline (CHANTIX) 0.5 MG tablet 0.$RemoveBef'5mg'qnnRHAuTvd$  once daily for 3 days, then .$RemoveBef'5mg'ZpmnIroGSw$  twice daily for 4 days, followed by $RemoveBef'1mg'uSIiOavsDB$  twice daily thereafter for 12 weeks (Patient not taking: Reported on 10/08/2021)   No facility-administered encounter medications on file as of 10/08/2021.    ALLERGIES: Allergies  Allergen Reactions   Metformin And Related Other (See Comments)    Stomach cramps,  Throat closing sensation, could not swallow   Penicillins Anaphylaxis, Hives, Shortness Of Breath and Swelling    Did it involve swelling of the face/tongue/throat, SOB, or low BP? Yes Did it involve sudden or severe rash/hives, skin peeling, or any reaction on the inside of your mouth or nose? Unk Did you need to seek medical attention at a hospital or doctor's office? No When did it last happen? Unk  If all above answers are "NO", may proceed with cephalosporin use. Got ceftriaxone before    Nsaids Other (See Comments)    States he is not supposed to take nsaids because of his colon  Other reaction(s): Other (See Comments) States he is not supposed to take nsaids because of his colon     VACCINATION STATUS: Immunization History  Administered Date(s) Administered   Influenza Inj Mdck Quad Pf 07/12/2018   Influenza Split 07/24/2011, 07/23/2012   Influenza Whole 08/04/2007, 07/10/2008, 07/25/2009, 08/01/2010   Influenza,inj,Quad PF,6+ Mos 07/19/2013, 07/04/2014, 07/19/2015, 06/24/2016, 07/28/2017, 07/12/2019  Moderna Sars-Covid-2 Vaccination 05/02/2020, 05/30/2020, 07/15/2021   Pneumococcal Polysaccharide-23 07/08/2016   Td 07/24/2003   Tdap  07/08/2016    Diabetes He presents for his initial diabetic visit. He has type 2 diabetes mellitus. Onset time: Diagnosed at approx age of 31. His disease course has been worsening. There are no hypoglycemic associated symptoms. Associated symptoms include fatigue and polyuria. There are no hypoglycemic complications. Symptoms are worsening. There are no diabetic complications. Risk factors for coronary artery disease include diabetes mellitus, dyslipidemia, family history, male sex, obesity, sedentary lifestyle and tobacco exposure. Current diabetic treatment includes insulin injections and oral agent (monotherapy). He is compliant with treatment most of the time. His weight is fluctuating minimally. He is following a generally unhealthy diet. When asked about meal planning, he reported none. He has not had a previous visit with a dietitian. He participates in exercise intermittently. (He presents today for his consultation, accompanied by his wife, with no meter or logs to review.  His most recent A1c from 11/30 was 13.9%.  He admits he does not routinely monitor glucose (only once per week usually).  He drinks mostly soda and energy drinks with very little water (says it hurts his stomach).  He also does not have any routine eating pattern, will eat when he is hungry.  He does engage in routine physical activity.  He is due for next eye exam, has never seen a podiatrist in the past.  He denies any s/s of hypoglycemia.) An ACE inhibitor/angiotensin II receptor blocker is not being taken. He does not see a podiatrist.Eye exam is current.  Hyperlipidemia This is a chronic problem. The current episode started more than 1 year ago. The problem is uncontrolled. Recent lipid tests were reviewed and are high. Exacerbating diseases include diabetes and obesity. Factors aggravating his hyperlipidemia include fatty foods and smoking. Current antihyperlipidemic treatment includes statins. The current treatment provides  mild improvement of lipids. Compliance problems include adherence to diet and adherence to exercise.  Risk factors for coronary artery disease include diabetes mellitus, dyslipidemia, family history, male sex, obesity and a sedentary lifestyle.    Review of systems  Constitutional: + Minimally fluctuating body weight, current Body mass index is 35.33 kg/m., + fatigue, no subjective hyperthermia, no subjective hypothermia Eyes: no blurry vision, no xerophthalmia ENT: no sore throat, no nodules palpated in throat, no dysphagia/odynophagia, no hoarseness Cardiovascular: no chest pain, no shortness of breath, no palpitations, no leg swelling Respiratory: no cough, no shortness of breath Gastrointestinal: no nausea/vomiting/diarrhea Genitourinary: + polyuria Musculoskeletal: no muscle/joint aches Skin: no rashes, no hyperemia Neurological: no tremors, no numbness, no tingling, no dizziness- has history of MS- sees new neurologist soon Psychiatric: no depression, no anxiety  Objective:     BP 107/69    Pulse 92    Ht $R'5\' 7"'BK$  (1.702 m)    Wt 225 lb 9.6 oz (102.3 kg)    SpO2 97%    BMI 35.33 kg/m   Wt Readings from Last 3 Encounters:  10/08/21 225 lb 9.6 oz (102.3 kg)  09/10/21 232 lb (105.2 kg)  08/22/21 232 lb 9.6 oz (105.5 kg)     BP Readings from Last 3 Encounters:  10/08/21 107/69  09/11/21 130/86  08/22/21 (!) 152/95     Physical Exam- Limited  Constitutional:  Body mass index is 35.33 kg/m. , not in acute distress, normal state of mind Eyes:  EOMI, no exophthalmos Neck: Supple Cardiovascular: RRR, no murmurs, rubs, or gallops, no edema Respiratory: Adequate breathing  efforts, no crackles, rales, rhonchi, or wheezing Musculoskeletal: no gross deformities, strength intact in all four extremities, no gross restriction of joint movements Skin:  no rashes, no hyperemia Neurological: no tremor with outstretched hands    CMP ( most recent) CMP     Component Value Date/Time    NA 129 (L) 09/10/2021 1945   NA 133 (L) 02/24/2021 1355   K 3.1 (L) 09/10/2021 1945   CL 96 (A) 09/18/2021 0000   CO2 24 09/10/2021 1945   GLUCOSE 547 (HH) 09/10/2021 1945   BUN 9 09/18/2021 0000   CREATININE 0.9 09/18/2021 0000   CREATININE 0.97 09/10/2021 1945   CREATININE 0.98 04/28/2016 1454   CALCIUM 9.4 09/10/2021 1945   PROT 8.1 09/10/2021 1945   PROT 7.6 02/24/2021 1355   ALBUMIN 4.4 09/10/2021 1945   ALBUMIN 4.1 02/24/2021 1355   AST 26 09/10/2021 1945   ALT 40 09/10/2021 1945   ALKPHOS 103 09/10/2021 1945   BILITOT 0.5 09/10/2021 1945   BILITOT 0.3 02/24/2021 1355   GFRNONAA >60 09/10/2021 1945   GFRNONAA >89 04/28/2016 1454   GFRAA 126 11/21/2020 1358   GFRAA >89 04/28/2016 1454     Diabetic Labs (most recent): Lab Results  Component Value Date   HGBA1C 13.9 09/18/2021   HGBA1C 12.8 (A) 08/22/2021   HGBA1C 9.6 (H) 12/25/2020     Lipid Panel ( most recent) Lipid Panel     Component Value Date/Time   CHOL 217 (H) 11/23/2019 1208   TRIG 433 (A) 09/18/2021 0000   HDL 33 (L) 11/23/2019 1208   CHOLHDL 6.6 (H) 11/23/2019 1208   CHOLHDL 11.7 (H) 12/11/2015 1423   VLDL 40 (H) 12/11/2015 1423   LDLCALC 91 09/18/2021 0000   LDLCALC 124 (H) 11/23/2019 1208   LDLDIRECT 63 07/23/2012 1552   LABVLDL 60 (H) 11/23/2019 1208               Assessment & Plan:   1) Type 2 diabetes mellitus with hyperglycemia, with long-term current use of insulin ([place])  He presents today for his consultation, accompanied by his wife, with no meter or logs to review.  His most recent A1c from 11/30 was 13.9%.  He admits he does not routinely monitor glucose (only once per week usually).  He drinks mostly soda and energy drinks with very little water (says it hurts his stomach).  He also does not have any routine eating pattern, will eat when he is hungry.  He does engage in routine physical activity.  He is due for next eye exam, has never seen a podiatrist in the past.  He  denies any s/s of hypoglycemia.  - ROSHAUN POUND has currently uncontrolled symptomatic type 2 DM since 40 years of age, with most recent A1c of 13.9 %.   -Recent labs reviewed.  - I had a long discussion with him about the progressive nature of diabetes and the pathology behind its complications. -his diabetes is not currently complicated but he remains at a high risk for more acute and chronic complications which include CAD, CVA, CKD, retinopathy, and neuropathy. These are all discussed in detail with him.  - I have counseled him on diet and weight management by adopting a carbohydrate restricted/protein rich diet. Patient is encouraged to switch to unprocessed or minimally processed complex starch and increased protein intake (animal or plant source), fruits, and vegetables. -  he is advised to stick to a routine mealtimes to eat 3 meals a day and  avoid unnecessary snacks (to snack only to correct hypoglycemia).   - he acknowledges that there is a room for improvement in his food and drink choices. - Suggestion is made for him to avoid simple carbohydrates from his diet including Cakes, Sweet Desserts, Ice Cream, Soda (diet and regular), Sweet Tea, Candies, Chips, Cookies, Store Bought Juices, Alcohol in Excess of 1-2 drinks a day, Artificial Sweeteners, Coffee Creamer, and "Sugar-free" Products. This will help patient to have more stable blood glucose profile and potentially avoid unintended weight gain.  - he will be scheduled with Jearld Fenton, RDN, CDE for diabetes education.  - I have approached him with the following individualized plan to manage his diabetes and patient agrees:   -He is advised to change how he takes his Lantus to 40 units SQ nightly (instead of in the morning).  -he is encouraged to start monitoring glucose 4 times daily, before meals and before bed, to log their readings on the clinic sheets provided, and bring them to review at follow up appointment in 2  weeks.  - he is warned not to take insulin without proper monitoring per orders. - Adjustment parameters are given to him for hypo and hyperglycemia in writing. - he is encouraged to call clinic for blood glucose levels less than 70 or above 300 mg /dl.  - his Vania Rea will be discontinued, risk outweighs benefit for this patient, especially given his complaint of polyuria.  - he will be considered for incretin therapy as appropriate next visit although this should be considered carefully due to elevated triglycerides increasing his risk of pancreatitis.  - Specific targets for  A1c; LDL, HDL, and Triglycerides were discussed with the patient.  2) Blood Pressure /Hypertension:  his blood pressure is controlled to target without the use of antihypertensive medications.  3) Lipids/Hyperlipidemia:    Review of his recent lipid panel from 09/18/21 showed controlled LDL at 91 and significantly elevated triglycerides of 433 .  he is advised to continue Lipitor 40 mg daily at bedtime.  Side effects and precautions discussed with him.  We also discussed avoiding fried foods and butter.  4)  Weight/Diet:  his Body mass index is 35.33 kg/m.  -  clearly complicating his diabetes care.   he is a candidate for weight loss. I discussed with him the fact that loss of 5 - 10% of his  current body weight will have the most impact on his diabetes management.  Exercise, and detailed carbohydrates information provided  -  detailed on discharge instructions.  5) Chronic Care/Health Maintenance: -he is not on ACEI/ARB and is on Statin medications and is encouraged to initiate and continue to follow up with Ophthalmology, Dentist, Podiatrist at least yearly or according to recommendations, and advised to [place] (expresses he is not ready to quit). I have recommended yearly flu vaccine and pneumonia vaccine at least every 5 years; moderate intensity exercise for up to 150 minutes weekly; and sleep for at least  7 hours a day.  - he is advised to maintain close follow up with Sharion Settler, DO for primary care needs, as well as his other providers for optimal and coordinated care.   - Time spent in this patient care: 60 min, of which > 50% was spent in counseling him about his diabetes and the rest reviewing his blood glucose logs, discussing his hypoglycemia and hyperglycemia episodes, reviewing his current and previous labs/studies (including abstraction from other facilities) and medications doses and developing a long  term treatment plan based on the latest standards of care/guidelines; and documenting his care.    Please refer to Patient Instructions for Blood Glucose Monitoring and Insulin/Medications Dosing Guide" in media tab for additional information. Please also refer to "Patient Self Inventory" in the Media tab for reviewed elements of pertinent patient history.  [place]on participated in the discussions, expressed understanding, and voiced agreement with the above plans.  All questions were answered to his satisfaction. he is encouraged to contact clinic should he have any questions or concerns prior to his return visit.     Follow up plan: - Return in about 2 weeks (around 10/22/2021) for Diabetes F/U, Bring meter and logs.    Rayetta Pigg, [facility] [address] [place], [place] [address] Phone: [phone] Fax: [phone]  10/08/2021, 11:52 AM

## 2021-10-08 NOTE — Patient Instructions (Signed)

## 2021-10-20 DIAGNOSIS — R0789 Other chest pain: Secondary | ICD-10-CM | POA: Diagnosis not present

## 2021-10-20 DIAGNOSIS — Z9114 Patient's other noncompliance with medication regimen: Secondary | ICD-10-CM | POA: Diagnosis not present

## 2021-10-20 DIAGNOSIS — R0602 Shortness of breath: Secondary | ICD-10-CM | POA: Diagnosis not present

## 2021-10-20 DIAGNOSIS — E1165 Type 2 diabetes mellitus with hyperglycemia: Secondary | ICD-10-CM | POA: Diagnosis not present

## 2021-10-20 DIAGNOSIS — Z79899 Other long term (current) drug therapy: Secondary | ICD-10-CM | POA: Diagnosis not present

## 2021-10-20 DIAGNOSIS — Z5321 Procedure and treatment not carried out due to patient leaving prior to being seen by health care provider: Secondary | ICD-10-CM | POA: Diagnosis not present

## 2021-10-20 DIAGNOSIS — R079 Chest pain, unspecified: Secondary | ICD-10-CM | POA: Diagnosis not present

## 2021-10-20 DIAGNOSIS — K219 Gastro-esophageal reflux disease without esophagitis: Secondary | ICD-10-CM | POA: Diagnosis not present

## 2021-10-20 DIAGNOSIS — M542 Cervicalgia: Secondary | ICD-10-CM | POA: Diagnosis not present

## 2021-10-22 ENCOUNTER — Telehealth: Payer: Self-pay | Admitting: Nurse Practitioner

## 2021-10-22 ENCOUNTER — Ambulatory Visit: Payer: Medicare Other | Admitting: Nurse Practitioner

## 2021-10-22 MED ORDER — ONETOUCH ULTRASOFT LANCETS MISC: 12 refills | Status: AC

## 2021-10-22 MED ORDER — ONETOUCH ULTRA 2 W/DEVICE KIT: PACK | 0 refills | Status: AC

## 2021-10-22 MED ORDER — ONETOUCH ULTRA VI STRP: ORAL_STRIP | 12 refills | Status: AC

## 2021-10-22 NOTE — Telephone Encounter (Signed)
Pt states he needs a new meter called to [facility]. He said his last one broke and his insurance will pay for the One Touch Ultra 2. He needs the strips and lanets too

## 2021-10-22 NOTE — Patient Instructions (Incomplete)

## 2021-10-28 ENCOUNTER — Other Ambulatory Visit: Payer: Self-pay

## 2021-10-28 ENCOUNTER — Emergency Department (HOSPITAL_COMMUNITY)
Admission: EM | Admit: 2021-10-28 | Discharge: 2021-10-28 | Disposition: A | Discharge status: Home / Self Care | Payer: Medicare Other | Attending: Emergency Medicine | Admitting: Emergency Medicine

## 2021-10-28 ENCOUNTER — Encounter (HOSPITAL_COMMUNITY): Payer: Self-pay | Admitting: Emergency Medicine

## 2021-10-28 DIAGNOSIS — M25561 Pain in right knee: Secondary | ICD-10-CM | POA: Insufficient documentation

## 2021-10-28 DIAGNOSIS — E119 Type 2 diabetes mellitus without complications: Secondary | ICD-10-CM | POA: Insufficient documentation

## 2021-10-28 DIAGNOSIS — Z794 Long term (current) use of insulin: Secondary | ICD-10-CM | POA: Diagnosis not present

## 2021-10-28 MED ORDER — PREDNISONE 20 MG PO TABS: 40.0000 mg | ORAL_TABLET | Freq: Every day | ORAL | 0 refills | Status: DC

## 2021-10-28 NOTE — Discharge Instructions (Signed)
It appears there could be some irritation of the ligaments on your right knee.  The steroid should help.  Follow-up with orthopedic surgery as needed.  Watch your sugar since we are giving steroids to help with the inflammation.

## 2021-10-28 NOTE — ED Triage Notes (Signed)
Pt to ED d/t R knee pain, states knee injury in pts 20s, noticed knot on side of knee x 3 days, steady gait, no deformity noted.

## 2021-10-30 ENCOUNTER — Telehealth: Payer: Self-pay | Admitting: Nurse Practitioner

## 2021-10-30 DIAGNOSIS — K219 Gastro-esophageal reflux disease without esophagitis: Secondary | ICD-10-CM | POA: Diagnosis not present

## 2021-10-30 DIAGNOSIS — Z933 Colostomy status: Secondary | ICD-10-CM | POA: Diagnosis not present

## 2021-10-30 DIAGNOSIS — Z888 Allergy status to other drugs, medicaments and biological substances status: Secondary | ICD-10-CM | POA: Diagnosis not present

## 2021-10-30 DIAGNOSIS — E785 Hyperlipidemia, unspecified: Secondary | ICD-10-CM | POA: Diagnosis not present

## 2021-10-30 DIAGNOSIS — E1065 Type 1 diabetes mellitus with hyperglycemia: Secondary | ICD-10-CM | POA: Diagnosis not present

## 2021-10-30 DIAGNOSIS — Z880 Allergy status to penicillin: Secondary | ICD-10-CM | POA: Diagnosis not present

## 2021-10-30 DIAGNOSIS — F17200 Nicotine dependence, unspecified, uncomplicated: Secondary | ICD-10-CM | POA: Diagnosis not present

## 2021-10-30 DIAGNOSIS — E876 Hypokalemia: Secondary | ICD-10-CM | POA: Diagnosis not present

## 2021-10-30 DIAGNOSIS — Z79899 Other long term (current) drug therapy: Secondary | ICD-10-CM | POA: Diagnosis not present

## 2021-10-30 DIAGNOSIS — R739 Hyperglycemia, unspecified: Secondary | ICD-10-CM | POA: Diagnosis not present

## 2021-10-30 NOTE — Telephone Encounter (Signed)
Pt called in regards to readings. States the hospital put him on prednisone 1/9. Pt is testing 4x a day.  1/8 349, 400, 425, 389  1/9 289, 329, 289, 349  1/10 389, 500, high, high  1/11 high, high and pt states it is still currently reading high.  Cb# [phone]

## 2021-10-30 NOTE — Telephone Encounter (Signed)
Hmm, those readings sound fishy.  I suspect some of those numbers are fabricated.  If he is concerned that his glucose is continuously reading high, then he needs to seek assistance at the ED.  He only appears to be on long-acting insulin and he may need short-acting insulin during the interim to help bring his readings down.  I have only seen him once for the initial consultation and then he didn't have his readings at the follow up so he rescheduled.  I do not feel comfortable recommending any changes without first seeing his glucose meter and readings.

## 2021-10-30 NOTE — ED Provider Notes (Signed)
[facility] EMERGENCY DEPARTMENT Provider Note   CSN: [id number] Arrival date & time: 10/28/21  0800     History  Chief Complaint  Patient presents with   Knee Pain    [name] is a 41 y.o. male.   Knee Pain Patient presents with right knee pain.  History of same.  Is diabetic.  States he injured in the 1 his 40s and has had pain that comes and goes.  Now more pain on the lateral aspect which is where he tends to hurt.  No new injury.    Home Medications Prior to Admission medications   Medication Sig Start Date End Date Taking? Authorizing Provider  predniSONE (DELTASONE) 20 MG tablet Take 2 tablets (40 mg total) by mouth daily. 10/28/21  Yes Davonna Belling, MD  atorvastatin (LIPITOR) 40 MG tablet Take 1 tablet (40 mg total) by mouth daily. 08/22/21   Shary Key, DO  Blood Glucose Monitoring Suppl (ONE TOUCH ULTRA 2) w/Device KIT Measure blood sugar daily and with meals for insulin 10/22/21   Brita Romp, NP  cloNIDine HCl (KAPVAY) 0.1 MG TB12 ER tablet Take 0.1 mg by mouth daily. 09/18/21   [provider]  diphenhydramine-acetaminophen (TYLENOL PM) 25-500 MG TABS tablet Take 3 tablets by mouth at bedtime as needed (Sleep).    [provider]  glucose blood (ONETOUCH ULTRA) test strip Use as instructed to monitor glucose 4 times daily 10/22/21   Brita Romp, NP  insulin glargine (LANTUS SOLOSTAR) 100 UNIT/ML Solostar Pen Inject 40 Units into the skin at bedtime. INJECT 40 UNITS INTO THE SKIN EVERY MORNING AS DIRECTED 10/08/21   Brita Romp, NP  Insulin Pen Needle 32G X 6 MM MISC 1 packet by Does not apply route 2 (two) times daily. 09/11/21   Sharion Settler, DO  Lancets (ONETOUCH ULTRASOFT) lancets Use as instructed to monitor glucose 4 times daily 10/22/21   Brita Romp, NP  LATUDA 60 MG TABS SMARTSIG:1 Tablet(s) By Mouth Every Evening 09/18/21   [provider]  loperamide (IMODIUM A-D) 2 MG tablet Take 1 tablet (2  mg total) by mouth 4 (four) times daily as needed for diarrhea or loose stools. 06/04/21   Gladys Damme, MD  LORazepam (ATIVAN) 1 MG tablet Take 0.5-1 mg by mouth daily as needed. 09/19/21   [provider]  omeprazole (PRILOSEC) 20 MG capsule TAKE 2 CAPSULES (40 MG TOTAL) BY MOUTH AT BEDTIME. 08/13/21   Espinoza, Alejandra, DO  tiZANidine (ZANAFLEX) 4 MG tablet Take 4 mg by mouth 2 (two) times daily as needed. 09/02/21   [provider]  traZODone (DESYREL) 100 MG tablet Take 50-100 mg by mouth at bedtime. 12/05/19   [provider]  varenicline (CHANTIX PAK) 0.5 MG X 11 & 1 MG X 42 tablet Take by mouth. 08/22/21   [provider]  VYVANSE 20 MG capsule Take 20 mg by mouth daily with breakfast. Take with 50 mg for a total of 70 mg 06/28/18   [provider]  VYVANSE 50 MG capsule Take 50 mg by mouth See admin instructions. Take with 20 mg for a total of 70 mg in the morning 06/28/18   [provider]      Allergies    Metformin and related, Penicillins, and Nsaids    Review of Systems   Review of Systems  Cardiovascular:  Negative for leg swelling.   Physical Exam Updated Vital Signs BP (!) 150/92 (BP Location: Right  Arm)    Pulse 97    Temp 98.1 F (36.7 C) (Oral)    Resp 20    Ht $R'5\' 7"'ug$  (1.702 m)    Wt 105.2 kg    SpO2 100%    BMI 36.34 kg/m  Physical Exam Vitals reviewed.  Musculoskeletal:     Comments: Tenderness to right knee laterally.  No deformity.  Mild pain with movement.  No distal edema.  No effusion.  Neurological:     Mental Status: He is alert.    ED Results / Procedures / Treatments   Labs (all labs ordered are listed, but only abnormal results are displayed) Labs Reviewed - No data to display  EKG None  Radiology No results found.  Procedures Procedures    Medications Ordered in ED Medications - No data to display  ED Course/ Medical Decision Making/ A&P                           Medical Decision  Making  Patient with pain in the right knee.  Initial differential diagnosis includes tendinitis infection fracture.  Tenderness laterally.  No injury acutely.  Tenderness over the lateral ligaments.  Likely sprain/irritation.  We will treat with some steroids.  Discharge home with outpatient follow-up as needed.  Does not appear to need x-ray imaging at this time.        Final Clinical Impression(s) / ED Diagnoses Final diagnoses:  Right knee pain, unspecified chronicity    Rx / DC Orders ED Discharge Orders          Ordered    predniSONE (DELTASONE) 20 MG tablet  Daily        10/28/21 1642              Davonna Belling, MD 10/30/21 [phone]

## 2021-10-30 NOTE — Telephone Encounter (Signed)
Pt stated okay he will go to ED.

## 2021-11-04 NOTE — Patient Instructions (Signed)

## 2021-11-05 ENCOUNTER — Encounter: Payer: Self-pay | Admitting: Nurse Practitioner

## 2021-11-05 ENCOUNTER — Ambulatory Visit: Payer: Medicare Other | Admitting: Nurse Practitioner

## 2021-11-05 ENCOUNTER — Other Ambulatory Visit: Payer: Self-pay

## 2021-11-05 ENCOUNTER — Ambulatory Visit (INDEPENDENT_AMBULATORY_CARE_PROVIDER_SITE_OTHER): Payer: Medicare Other | Admitting: Nurse Practitioner

## 2021-11-05 VITALS — BP 129/80 | HR 102 | Ht 67.0 in | Wt 227.8 lb

## 2021-11-05 DIAGNOSIS — E1165 Type 2 diabetes mellitus with hyperglycemia: Secondary | ICD-10-CM | POA: Diagnosis not present

## 2021-11-05 DIAGNOSIS — Z794 Long term (current) use of insulin: Secondary | ICD-10-CM

## 2021-11-05 DIAGNOSIS — E559 Vitamin D deficiency, unspecified: Secondary | ICD-10-CM

## 2021-11-05 DIAGNOSIS — E782 Mixed hyperlipidemia: Secondary | ICD-10-CM | POA: Diagnosis not present

## 2021-11-05 MED ORDER — INSULIN LISPRO (1 UNIT DIAL) 100 UNIT/ML (KWIKPEN): 5.0000 [IU] | PEN_INJECTOR | Freq: Three times a day (TID) | SUBCUTANEOUS | 3 refills | Status: DC

## 2021-11-05 NOTE — Progress Notes (Signed)
Endocrinology Follow Up Note       11/05/2021, 3:39 PM   Subjective:    Patient ID: [name], male    DOB: 1981/10/17.  [name] is being seen in follow up after being seen in consultation for management of currently uncontrolled symptomatic diabetes requested by  [place].   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[place])    Bipolar disorder ([place])    On disability   Depression    Diabetes mellitus without complication ([place])    per patient : under control with diet, does not monitor cbg at home    GERD (gastroesophageal reflux disease)    Headache(784.0)    daily headaches   Hyperlipidemia    Neuromuscular disorder ([place])    Beckers muscular dystrophy   Obesity    PTSD (post-traumatic stress disorder)    Sleep apnea    Does not tolerate CPAP    Past Surgical History:  Procedure Laterality Date   APPLICATION OF WOUND VAC N/A 11/23/2018   Procedure: Application Of Wound Vac;  Surgeon: Kinsinger, Arta Bruce, MD;  Location: [place];  Service: General;  Laterality: N/A;   COLECTOMY N/A 11/23/2018   Procedure: TOTAL COLECTOMY;  Surgeon: Kinsinger, Arta Bruce, MD;  Location: [place];  Service: General;  Laterality: N/A;   ILEOSTOMY N/A 11/23/2018   Procedure: ILEOSTOMY;  Surgeon: Kinsinger, Arta Bruce, MD;  Location: [place];  Service: General;  Laterality: N/A;   ILEOSTOMY CLOSURE N/A 05/19/2019   Procedure: ILEOSTOMY REVERSAL WITH ILEOCOLIC AND COLORECTAL ANASTOMOSIS;  Surgeon: Kinsinger, Arta Bruce, MD;  Location: [facility];  Service: General;  Laterality: N/A;   [place] N/A 01/04/2021   Procedure: LAPAROSCOPIC INCISIONAL HERNIA REPAIR WITH MESH; LYSIS OF ADHESIONS;  Surgeon: Kieth Brightly, Arta Bruce, MD;  Location: [facility];  Service: General;  Laterality: N/A;   IR GUIDED DRAIN W CATHETER PLACEMENT  02/14/2021   IR RADIOLOGIST EVAL & MGMT   02/19/2021   IR RADIOLOGIST EVAL & MGMT  03/21/2021   IR RADIOLOGIST EVAL & MGMT  04/03/2021   IR RADIOLOGIST EVAL & MGMT  04/17/2021   NM MYOCAR PERF WALL MOTION  01/22/2011   protocol: Persantine, moderate reversible inferior defect post stress EF 48%, high risk scan   TONSILLECTOMY     TRANSTHORACIC ECHOCARDIOGRAM  07/05/2004   EF=>55% normal study     Social History   Socioeconomic History   Marital status: Married    Spouse name: Not on file   Number of children: 2   Years of education: 12   Highest education level: Not on file  Occupational History    Employer: UNEMPLOYED  Tobacco Use   Smoking status: Every Day    Packs/day: 1.50    Years: 21.00    Pack years: 31.50    Types: Cigarettes   Smokeless tobacco: Never  Vaping Use   Vaping Use: Former   Devices: Increased nicotine cravings  Substance and Sexual Activity   Alcohol use: No    Alcohol/week: 0.0 standard drinks    Comment: quit 2013   Drug use: Not Currently  Types: Marijuana    Comment: 7-27   Sexual activity: Yes    Birth control/protection: None  Other Topics Concern   Not on file  Social History Narrative   Patient lives at home with parents, brother and brothers wife.    Patient is single.    Patient has 2 children.    Patient has a high school education.    Patient is unemployed.    Patient is right handed.    Social Determinants of Health   Financial Resource Strain: Not on file  Food Insecurity: Not on file  Transportation Needs: Not on file  Physical Activity: Not on file  Stress: Not on file  Social Connections: Not on file    Family History  Problem Relation Age of Onset   COPD Mother    Depression Mother    Diabetes Mother    Hyperlipidemia Mother    Asthma Father    Arthritis Father    Diabetes Father    Heart disease Father    Hyperlipidemia Father    Hypertension Father    Hypertension Brother     Outpatient Encounter Medications as of 11/05/2021  Medication Sig    atorvastatin (LIPITOR) 40 MG tablet Take 1 tablet (40 mg total) by mouth daily.   Blood Glucose Monitoring Suppl (ONE TOUCH ULTRA 2) w/Device KIT Measure blood sugar daily and with meals for insulin   cloNIDine HCl (KAPVAY) 0.1 MG TB12 ER tablet Take 0.1 mg by mouth daily.   diphenhydramine-acetaminophen (TYLENOL PM) 25-500 MG TABS tablet Take 3 tablets by mouth at bedtime as needed (Sleep).   glucose blood (ONETOUCH ULTRA) test strip Use as instructed to monitor glucose 4 times daily   insulin glargine (LANTUS SOLOSTAR) 100 UNIT/ML Solostar Pen Inject 40 Units into the skin at bedtime. INJECT 40 UNITS INTO THE SKIN EVERY MORNING AS DIRECTED   insulin lispro (HUMALOG KWIKPEN) 100 UNIT/ML KwikPen Inject 5-11 Units into the skin 3 (three) times daily.   Insulin Pen Needle 32G X 6 MM MISC 1 packet by Does not apply route 2 (two) times daily.   Lancets (ONETOUCH ULTRASOFT) lancets Use as instructed to monitor glucose 4 times daily   LATUDA 60 MG TABS SMARTSIG:1 Tablet(s) By Mouth Every Evening   loperamide (IMODIUM A-D) 2 MG tablet Take 1 tablet (2 mg total) by mouth 4 (four) times daily as needed for diarrhea or loose stools.   LORazepam (ATIVAN) 1 MG tablet Take 0.5-1 mg by mouth daily as needed.   omeprazole (PRILOSEC) 20 MG capsule TAKE 2 CAPSULES (40 MG TOTAL) BY MOUTH AT BEDTIME.   potassium chloride SA (KLOR-CON M) 20 MEQ tablet Take 20 mEq by mouth 2 (two) times daily.   tiZANidine (ZANAFLEX) 4 MG tablet Take 4 mg by mouth 2 (two) times daily as needed.   traZODone (DESYREL) 100 MG tablet Take 50-100 mg by mouth at bedtime.   varenicline (CHANTIX PAK) 0.5 MG X 11 & 1 MG X 42 tablet Take by mouth.   VYVANSE 20 MG capsule Take 20 mg by mouth daily with breakfast. Take with 50 mg for a total of 70 mg   VYVANSE 50 MG capsule Take 50 mg by mouth See admin instructions. Take with 20 mg for a total of 70 mg in the morning   [DISCONTINUED] predniSONE (DELTASONE) 20 MG tablet Take 2 tablets (40 mg  total) by mouth daily. (Patient not taking: Reported on 11/05/2021)   No facility-administered encounter medications on file as of 11/05/2021.  ALLERGIES: Allergies  Allergen Reactions   Metformin And Related Other (See Comments)    Stomach cramps,  Throat closing sensation, could not swallow   Penicillins Anaphylaxis, Hives, Shortness Of Breath and Swelling    Did it involve swelling of the face/tongue/throat, SOB, or low BP? Yes Did it involve sudden or severe rash/hives, skin peeling, or any reaction on the inside of your mouth or nose? Unk Did you need to seek medical attention at a hospital or doctor's office? No When did it last happen? Unk  If all above answers are "NO", may proceed with cephalosporin use. Got ceftriaxone before    Nsaids Other (See Comments)    States he is not supposed to take nsaids because of his colon  Other reaction(s): Other (See Comments) States he is not supposed to take nsaids because of his colon  Other reaction(s): Headache, Other (See Comments) States he is not supposed to take nsaids because of his colon     VACCINATION STATUS: Immunization History  Administered Date(s) Administered   Influenza Inj Mdck Quad Pf 07/12/2018   Influenza Split 07/24/2011, 07/23/2012   Influenza Whole 08/04/2007, 07/10/2008, 07/25/2009, 08/01/2010   Influenza,inj,Quad PF,6+ Mos 07/19/2013, 07/04/2014, 07/19/2015, 06/24/2016, 07/28/2017, 07/12/2019   Moderna Sars-Covid-2 Vaccination 05/02/2020, 05/30/2020, 07/15/2021   Pneumococcal Polysaccharide-23 07/08/2016   Td 07/24/2003   Tdap 07/08/2016    Diabetes He presents for his follow-up diabetic visit. He has type 2 diabetes mellitus. Onset time: Diagnosed at approx age of 65. His disease course has been fluctuating. There are no hypoglycemic associated symptoms. Associated symptoms include fatigue, polydipsia and polyuria. There are no hypoglycemic complications. Symptoms are stable. There are no diabetic  complications. Risk factors for coronary artery disease include diabetes mellitus, dyslipidemia, family history, male sex, obesity, sedentary lifestyle and tobacco exposure. Current diabetic treatment includes insulin injections. He is compliant with treatment most of the time. His weight is fluctuating minimally. He is following a generally unhealthy diet. When asked about meal planning, he reported none. He has not had a previous visit with a dietitian. He participates in exercise intermittently. His home blood glucose trend is fluctuating minimally. His breakfast blood glucose range is generally >200 mg/dl. His lunch blood glucose range is generally >200 mg/dl. His dinner blood glucose range is generally >200 mg/dl. His bedtime blood glucose range is generally >200 mg/dl. His overall blood glucose range is >200 mg/dl. (He presents today, accompanied by his wife, with his meter and logs showing significantly above target glycemic profile overall.  He was not due for another A1c today.  He did go to the ED in between visits for complaints of high glucose secondary to prednisone administration.  He has not changed much with his diet, still consumes soda and eats what he likes, and on no specific pattern.  He denies any hypoglycemia.) An ACE inhibitor/angiotensin II receptor blocker is not being taken. He does not see a podiatrist.Eye exam is current.  Hyperlipidemia This is a chronic problem. The current episode started more than 1 year ago. The problem is uncontrolled. Recent lipid tests were reviewed and are high. Exacerbating diseases include diabetes and obesity. Factors aggravating his hyperlipidemia include fatty foods and smoking. Current antihyperlipidemic treatment includes statins. The current treatment provides mild improvement of lipids. Compliance problems include adherence to diet and adherence to exercise.  Risk factors for coronary artery disease include diabetes mellitus, dyslipidemia, family  history, male sex, obesity and a sedentary lifestyle.    Review of systems  Constitutional: +  Minimally fluctuating body weight, current Body mass index is 35.68 kg/m., + fatigue, no subjective hyperthermia, no subjective hypothermia Eyes: no blurry vision, no xerophthalmia ENT: no sore throat, no nodules palpated in throat, no dysphagia/odynophagia, no hoarseness Cardiovascular: no chest pain, no shortness of breath, no palpitations, no leg swelling Respiratory: no cough, no shortness of breath Gastrointestinal: no nausea/vomiting/diarrhea Genitourinary: + polyuria Musculoskeletal: no muscle/joint aches Skin: no rashes, no hyperemia Neurological: no tremors, no numbness, no tingling, no dizziness- has history of MS- sees new neurologist soon Psychiatric: no depression, no anxiety  Objective:     BP 129/80    Pulse (!) 102    Ht $R'5\' 7"'zm$  (1.702 m)    Wt 227 lb 12.8 oz (103.3 kg)    SpO2 99%    BMI 35.68 kg/m   Wt Readings from Last 3 Encounters:  11/05/21 227 lb 12.8 oz (103.3 kg)  10/28/21 232 lb (105.2 kg)  10/08/21 225 lb 9.6 oz (102.3 kg)     BP Readings from Last 3 Encounters:  11/05/21 129/80  10/28/21 (!) 150/92  10/08/21 107/69      Physical Exam- Limited  Constitutional:  Body mass index is 35.68 kg/m. , not in acute distress, normal state of mind Eyes:  EOMI, no exophthalmos Neck: Supple Cardiovascular: RRR, no murmurs, rubs, or gallops, no edema Respiratory: Adequate breathing efforts, no crackles, rales, rhonchi, or wheezing Musculoskeletal: no gross deformities, strength intact in all four extremities, no gross restriction of joint movements Skin:  no rashes, no hyperemia, multiple tattoos Neurological: no tremor with outstretched hands    CMP ( most recent) CMP     Component Value Date/Time   NA 129 (L) 09/10/2021 1945   NA 133 (L) 02/24/2021 1355   K 3.1 (L) 09/10/2021 1945   CL 96 (A) 09/18/2021 0000   CO2 24 09/10/2021 1945   GLUCOSE 547 (HH)  09/10/2021 1945   BUN 9 09/18/2021 0000   CREATININE 0.9 09/18/2021 0000   CREATININE 0.97 09/10/2021 1945   CREATININE 0.98 04/28/2016 1454   CALCIUM 9.4 09/10/2021 1945   PROT 8.1 09/10/2021 1945   PROT 7.6 02/24/2021 1355   ALBUMIN 4.4 09/10/2021 1945   ALBUMIN 4.1 02/24/2021 1355   AST 26 09/10/2021 1945   ALT 40 09/10/2021 1945   ALKPHOS 103 09/10/2021 1945   BILITOT 0.5 09/10/2021 1945   BILITOT 0.3 02/24/2021 1355   GFRNONAA >60 09/10/2021 1945   GFRNONAA >89 04/28/2016 1454   GFRAA 126 11/21/2020 1358   GFRAA >89 04/28/2016 1454     Diabetic Labs (most recent): Lab Results  Component Value Date   HGBA1C 13.9 09/18/2021   HGBA1C 12.8 (A) 08/22/2021   HGBA1C 9.6 (H) 12/25/2020     Lipid Panel ( most recent) Lipid Panel     Component Value Date/Time   CHOL 217 (H) 11/23/2019 1208   TRIG 433 (A) 09/18/2021 0000   HDL 33 (L) 11/23/2019 1208   CHOLHDL 6.6 (H) 11/23/2019 1208   CHOLHDL 11.7 (H) 12/11/2015 1423   VLDL 40 (H) 12/11/2015 1423   LDLCALC 91 09/18/2021 0000   LDLCALC 124 (H) 11/23/2019 1208   LDLDIRECT 63 07/23/2012 1552   LABVLDL 60 (H) 11/23/2019 1208               Assessment & Plan:   1) Type 2 diabetes mellitus with hyperglycemia, with long-term current use of insulin ([place])  He presents today, accompanied by his wife, with his meter and logs showing significantly  above target glycemic profile overall.  He was not due for another A1c today.  He did go to the ED in between visits for complaints of high glucose secondary to prednisone administration.  He has not changed much with his diet, still consumes soda and eats what he likes, and on no specific pattern.  He denies any hypoglycemia.  - [name] has currently uncontrolled symptomatic type 2 DM since 41 years of age, with most recent A1c of 13.9 %.   -Recent labs reviewed.  - I had a long discussion with him about the progressive nature of diabetes and the pathology behind its  complications. -his diabetes is not currently complicated but he remains at a high risk for more acute and chronic complications which include CAD, CVA, CKD, retinopathy, and neuropathy. These are all discussed in detail with him.  - Nutritional counseling repeated at each appointment due to patients tendency to fall back in to old habits.  - The patient admits there is a room for improvement in their diet and drink choices. -  Suggestion is made for the patient to avoid simple carbohydrates from their diet including Cakes, Sweet Desserts / Pastries, Ice Cream, Soda (diet and regular), Sweet Tea, Candies, Chips, Cookies, Sweet Pastries, Store Bought Juices, Alcohol in Excess of 1-2 drinks a day, Artificial Sweeteners, Coffee Creamer, and "Sugar-free" Products. This will help patient to have stable blood glucose profile and potentially avoid unintended weight gain.   - I encouraged the patient to switch to unprocessed or minimally processed complex starch and increased protein intake (animal or plant source), fruits, and vegetables.   - Patient is advised to stick to a routine mealtimes to eat 3 meals a day and avoid unnecessary snacks (to snack only to correct hypoglycemia).  - he will be scheduled with Jearld Fenton, RDN, CDE for diabetes education.  - I have approached him with the following individualized plan to manage his diabetes and patient agrees:   -He is advised to continue his Lantus at 40 units SQ nightly.  Due to his significant hyperglycemia, he was initiated on bolus insulin today as well.  I discussed and started him on Humalog 5-11 units TID with meals if glucose is above 90 and he is eating (Specific instructions on how to titrate insulin dosage based on glucose readings given to patient in writing).  He and his wife demonstrated their ability to properly use the SSI to dose his meal-time insulin.  The patient has a fear of needles, therefore his wife performs his injections.  He  is hesitant to change his dietary habits.  -he is encouraged to continue monitoring glucose 4 times daily, before meals and before bed, and to call the clinic if he has readings less than 70 or above 300 for 3 tests in a row.  - he is warned not to take insulin without proper monitoring per orders. - Adjustment parameters are given to him for hypo and hyperglycemia in writing.  - his Vania Rea will be discontinued, risk outweighs benefit for this patient, especially given his complaint of polyuria.  - he will be considered for incretin therapy as appropriate next visit although this should be considered carefully due to elevated triglycerides increasing his risk of pancreatitis.  - Specific targets for  A1c; LDL, HDL, and Triglycerides were discussed with the patient.  2) Blood Pressure /Hypertension:  his blood pressure is controlled to target without the use of antihypertensive medications.  3) Lipids/Hyperlipidemia:    Review  of his recent lipid panel from 09/18/21 showed controlled LDL at 91 and significantly elevated triglycerides of 433 .  he is advised to continue Lipitor 40 mg daily at bedtime.  Side effects and precautions discussed with him.  We also discussed avoiding fried foods and butter.  4)  Weight/Diet:  his Body mass index is 35.68 kg/m.  -  clearly complicating his diabetes care.   he is a candidate for weight loss. I discussed with him the fact that loss of 5 - 10% of his  current body weight will have the most impact on his diabetes management.  Exercise, and detailed carbohydrates information provided  -  detailed on discharge instructions.  5) Chronic Care/Health Maintenance: -he is not on ACEI/ARB and is on Statin medications and is encouraged to initiate and continue to follow up with Ophthalmology, Dentist, Podiatrist at least yearly or according to recommendations, and advised to [place] (expresses he is not ready to quit). I have recommended yearly flu vaccine  and pneumonia vaccine at least every 5 years; moderate intensity exercise for up to 150 minutes weekly; and sleep for at least 7 hours a day.  - he is advised to maintain close follow up with [facility] for primary care needs, as well as his other providers for optimal and coordinated care.     I spent 46 minutes in the care of the patient today including review of labs from [facility], Lipids, Thyroid Function, Hematology (current and previous including abstractions from other facilities); face-to-face time discussing  his blood glucose readings/logs, discussing hypoglycemia and hyperglycemia episodes and symptoms, medications doses, his options of short and long term treatment based on the latest standards of care / guidelines;  discussion about incorporating lifestyle medicine;  and documenting the encounter.    Please refer to Patient Instructions for Blood Glucose Monitoring and Insulin/Medications Dosing Guide"  in media tab for additional information. Please  also refer to " Patient Self Inventory" in the Media  tab for reviewed elements of pertinent patient history.  [place] participated in the discussions, expressed understanding, and voiced agreement with the above plans.  All questions were answered to his satisfaction. he is encouraged to contact clinic should he have any questions or concerns prior to his return visit.     Follow up plan: - Return in about 1 month (around 12/06/2021) for Diabetes F/U, Bring meter and logs.   Rayetta Pigg, [facility] [address] [place], [place] [address] Phone: [phone] Fax: [phone]  11/05/2021, 3:39 PM

## 2021-11-14 DIAGNOSIS — R1084 Generalized abdominal pain: Secondary | ICD-10-CM | POA: Diagnosis not present

## 2021-11-19 ENCOUNTER — Other Ambulatory Visit: Payer: Self-pay

## 2021-11-19 ENCOUNTER — Emergency Department (HOSPITAL_COMMUNITY): Payer: Medicare Other

## 2021-11-19 ENCOUNTER — Emergency Department (HOSPITAL_COMMUNITY)
Admission: EM | Admit: 2021-11-19 | Discharge: 2021-11-19 | Disposition: A | Discharge status: Home / Self Care | Payer: Medicare Other | Attending: Emergency Medicine | Admitting: Emergency Medicine

## 2021-11-19 ENCOUNTER — Encounter (HOSPITAL_COMMUNITY): Payer: Self-pay | Admitting: Emergency Medicine

## 2021-11-19 DIAGNOSIS — R5383 Other fatigue: Secondary | ICD-10-CM | POA: Insufficient documentation

## 2021-11-19 DIAGNOSIS — M791 Myalgia, unspecified site: Secondary | ICD-10-CM | POA: Insufficient documentation

## 2021-11-19 DIAGNOSIS — Z79899 Other long term (current) drug therapy: Secondary | ICD-10-CM | POA: Diagnosis not present

## 2021-11-19 DIAGNOSIS — R209 Unspecified disturbances of skin sensation: Secondary | ICD-10-CM | POA: Diagnosis not present

## 2021-11-19 DIAGNOSIS — R112 Nausea with vomiting, unspecified: Secondary | ICD-10-CM | POA: Insufficient documentation

## 2021-11-19 DIAGNOSIS — E1165 Type 2 diabetes mellitus with hyperglycemia: Secondary | ICD-10-CM | POA: Insufficient documentation

## 2021-11-19 DIAGNOSIS — R531 Weakness: Secondary | ICD-10-CM | POA: Insufficient documentation

## 2021-11-19 DIAGNOSIS — Z794 Long term (current) use of insulin: Secondary | ICD-10-CM | POA: Insufficient documentation

## 2021-11-19 DIAGNOSIS — R739 Hyperglycemia, unspecified: Secondary | ICD-10-CM | POA: Diagnosis not present

## 2021-11-19 DIAGNOSIS — R200 Anesthesia of skin: Secondary | ICD-10-CM | POA: Diagnosis not present

## 2021-11-19 DIAGNOSIS — R000 Tachycardia, unspecified: Secondary | ICD-10-CM | POA: Diagnosis not present

## 2021-11-19 DIAGNOSIS — R55 Syncope and collapse: Secondary | ICD-10-CM | POA: Insufficient documentation

## 2021-11-19 DIAGNOSIS — R3589 Other polyuria: Secondary | ICD-10-CM | POA: Diagnosis present

## 2021-11-19 DIAGNOSIS — R29818 Other symptoms and signs involving the nervous system: Secondary | ICD-10-CM | POA: Diagnosis not present

## 2021-11-19 LAB — BASIC METABOLIC PANEL
Anion gap: 11 (ref 5–15)
BUN: 10 mg/dL (ref 6–20)
CO2: 24 mmol/L (ref 22–32)
Calcium: 9.4 mg/dL (ref 8.9–10.3)
Chloride: 96 mmol/L — ABNORMAL LOW (ref 98–111)
Creatinine, Ser: 0.9 mg/dL (ref 0.61–1.24)
GFR, Estimated: >60 mL/min (ref >60)
Glucose, Bld: 489 mg/dL — ABNORMAL HIGH (ref 70–99)
Potassium: 3.7 mmol/L (ref 3.5–5.1)
Sodium: 131 mmol/L — ABNORMAL LOW (ref 135–145)

## 2021-11-19 LAB — URINALYSIS, MICROSCOPIC (REFLEX)
Bacteria, UA: NONE SEEN
Squamous Epithelial / HPF: NONE SEEN (ref 0–5)
WBC, UA: NONE SEEN WBC/hpf (ref 0–5)

## 2021-11-19 LAB — CBC WITH DIFFERENTIAL/PLATELET
Abs Immature Granulocytes: 0.04 10*3/uL (ref 0.00–0.07)
Basophils Absolute: 0.1 10*3/uL (ref 0.0–0.1)
Basophils Relative: 1 %
Eosinophils Absolute: 0.2 10*3/uL (ref 0.0–0.5)
Eosinophils Relative: 2 %
HCT: 46.8 % (ref 39.0–52.0)
Hemoglobin: 16 g/dL (ref 13.0–17.0)
Immature Granulocytes: 0 %
Lymphocytes Relative: 28 %
Lymphs Abs: 3.1 10*3/uL (ref 0.7–4.0)
MCH: 29 pg (ref 26.0–34.0)
MCHC: 34.2 g/dL (ref 30.0–36.0)
MCV: 84.8 fL (ref 80.0–100.0)
Monocytes Absolute: 0.7 10*3/uL (ref 0.1–1.0)
Monocytes Relative: 6 %
Neutro Abs: 6.8 10*3/uL (ref 1.7–7.7)
Neutrophils Relative %: 63 %
Platelets: 165 10*3/uL (ref 150–400)
RBC: 5.52 MIL/uL (ref 4.22–5.81)
RDW: 14.1 % (ref 11.5–15.5)
WBC: 10.9 10*3/uL — ABNORMAL HIGH (ref 4.0–10.5)
nRBC: 0 % (ref 0.0–0.2)

## 2021-11-19 LAB — CBG MONITORING, ED
Glucose-Capillary: 531 mg/dL (ref 70–99)
Glucose-Capillary: 269 mg/dL — ABNORMAL HIGH (ref 70–99)

## 2021-11-19 LAB — RAPID URINE DRUG SCREEN, HOSP PERFORMED
Amphetamines: POSITIVE — AB
Barbiturates: NOT DETECTED
Benzodiazepines: NOT DETECTED
Cocaine: NOT DETECTED
Opiates: NOT DETECTED
Tetrahydrocannabinol: NOT DETECTED

## 2021-11-19 LAB — BETA-HYDROXYBUTYRIC ACID: Beta-Hydroxybutyric Acid: 0.12 mmol/L (ref 0.05–0.27)

## 2021-11-19 LAB — LACTIC ACID, PLASMA
Lactic Acid, Venous: 2.9 mmol/L (ref 0.5–1.9)
Lactic Acid, Venous: 1.5 mmol/L (ref 0.5–1.9)

## 2021-11-19 LAB — URINALYSIS, ROUTINE W REFLEX MICROSCOPIC
Bilirubin Urine: NEGATIVE
Glucose, UA: >=500 mg/dL — AB
Hgb urine dipstick: NEGATIVE
Ketones, ur: NEGATIVE mg/dL
Leukocytes,Ua: NEGATIVE
Nitrite: NEGATIVE
Protein, ur: NEGATIVE mg/dL
Specific Gravity, Urine: 1.01 (ref 1.005–1.030)
pH: 5.5 (ref 5.0–8.0)

## 2021-11-19 LAB — MAGNESIUM: Magnesium: 1.8 mg/dL (ref 1.7–2.4)

## 2021-11-19 LAB — LIPASE, BLOOD: Lipase: 37 U/L (ref 11–51)

## 2021-11-19 LAB — OSMOLALITY: Osmolality: 303 mOsm/kg — ABNORMAL HIGH (ref 275–295)

## 2021-11-19 MED ORDER — DIAZEPAM 5 MG/ML IJ SOLN
5.0000 mg | Freq: Once | INTRAMUSCULAR | Status: AC
Start: 2021-11-19 — End: 2021-11-19
  Administered 2021-11-19: 5 mg via INTRAVENOUS
  Filled 2021-11-19: qty 2

## 2021-11-19 MED ORDER — INSULIN LISPRO (1 UNIT DIAL) 100 UNIT/ML (KWIKPEN): 5.0000 [IU] | PEN_INJECTOR | Freq: Three times a day (TID) | SUBCUTANEOUS | 3 refills | Status: DC

## 2021-11-19 MED ORDER — LACTATED RINGERS IV BOLUS
20.0000 mL/kg | Freq: Once | INTRAVENOUS | Status: AC
  Administered 2021-11-19: 2066 mL via INTRAVENOUS

## 2021-11-19 NOTE — ED Notes (Signed)
Patient Alert and oriented to baseline. Stable and ambulatory to baseline. Patient verbalized understanding of the discharge instructions.  Patient belongings were taken by the patient.   

## 2021-11-19 NOTE — ED Provider Notes (Signed)
[name] EMERGENCY DEPARTMENT Provider Note   CSN: [id number] Arrival date & time: 11/19/21  0254     History  Chief Complaint  Patient presents with   Hyperglycemia   Numbness    [name] is a 41 y.o. male.   Hyperglycemia Associated symptoms: fatigue, nausea, polyuria, vomiting and weakness   Patient presents for hyperglycemia.  Medical history is notable for HLD, alcohol abuse, bipolar disorder, polysubstance abuse, T2DM, anxiety, depression.  [name] reports that [name] is currently sober from alcohol and drugs.  [name] has been utilizing insulin at home but feels that it may not be working.  [name] reports BG of 400 this morning.  After 11 units of insulin and food, sugar increased to 598.  [name] has had nausea and vomiting this morning.  3 days ago, [name] had 3 separate syncopal episodes.  [name] is unable to describe the syncopal episodes because [name] states [name] was "blacked out" at the time.  [name] describes these blackout episodes as related to his PTSD.  Syncopal episodes were witnessed by his significant other.  [name] has also had 3 days of right-sided numbness and weakness that involve both his arm and his leg.    Home Medications Prior to Admission medications   Medication Sig Start Date End Date Taking? Authorizing Provider  atorvastatin (LIPITOR) 40 MG tablet Take 1 tablet (40 mg total) by mouth daily. 08/22/21  Yes Paige, Victoria J, DO  cloNIDine HCl (KAPVAY) 0.1 MG TB12 ER tablet Take 0.1 mg by mouth daily. 09/18/21  Yes [provider]  Ibuprofen-diphenhydrAMINE Cit (ADVIL PM PO) Take 1 tablet by mouth at bedtime as needed (sleep).   Yes [provider]  insulin glargine (LANTUS SOLOSTAR) 100 UNIT/ML Solostar Pen Inject 40 Units into the skin at bedtime. INJECT 40 UNITS INTO THE SKIN EVERY MORNING AS DIRECTED Patient taking differently: Inject 40 Units into the skin at bedtime. 10/08/21  Yes Reardon, Whitney J, NP  LATUDA 60 MG TABS Take 1 tablet by mouth daily. 09/18/21  Yes  [provider]  LORazepam (ATIVAN) 1 MG tablet Take 0.5-1 mg by mouth daily as needed for anxiety. 09/19/21  Yes [provider]  omeprazole (PRILOSEC) 20 MG capsule TAKE 2 CAPSULES (40 MG TOTAL) BY MOUTH AT BEDTIME. Patient taking differently: Take 40 mg by mouth daily. 08/13/21  Yes Espinoza, Dawson Bills, DO  potassium chloride SA (KLOR-CON M) 20 MEQ tablet Take 20 mEq by mouth daily. 10/31/21  Yes [provider]  tiZANidine (ZANAFLEX) 4 MG tablet Take 4 mg by mouth 2 (two) times daily as needed for muscle spasms. 09/02/21  Yes [provider]  traZODone (DESYREL) 100 MG tablet Take 50-100 mg by mouth at bedtime. 12/05/19  Yes [provider]  VYVANSE 20 MG capsule Take 20 mg by mouth daily with breakfast. Take with 50 mg for a total of 70 mg 06/28/18  Yes [provider]  VYVANSE 50 MG capsule Take 50 mg by mouth See admin instructions. Take with 20 mg for a total of 70 mg in the morning 06/28/18  Yes [provider]  Blood Glucose Monitoring Suppl (ONE TOUCH ULTRA 2) w/Device KIT Measure blood sugar daily and with meals for insulin 10/22/21   Brita Romp, NP  glucose blood (ONETOUCH ULTRA) test strip Use as instructed to monitor glucose 4 times daily 10/22/21   Brita Romp, NP  insulin lispro (HUMALOG KWIKPEN) 100 UNIT/ML KwikPen Inject 5-11 Units into the skin 3 (three) times  daily. 11/19/21   Gloris Manchester, MD  Insulin Pen Needle 32G X 6 MM MISC 1 packet by Does not apply route 2 (two) times daily. 09/11/21   Sabino Dick, DO  Lancets (ONETOUCH ULTRASOFT) lancets Use as instructed to monitor glucose 4 times daily 10/22/21   Dani Gobble, NP  loperamide (IMODIUM A-D) 2 MG tablet Take 1 tablet (2 mg total) by mouth 4 (four) times daily as needed for diarrhea or loose stools. Patient not taking: Reported on 11/19/2021 06/04/21   Shirlean Mylar, MD      Allergies    Metformin and related, Penicillins, and Nsaids     Review of Systems   Review of Systems  Constitutional:  Positive for fatigue.  Gastrointestinal:  Positive for nausea and vomiting.  Endocrine: Positive for polyuria.  Neurological:  Positive for syncope, weakness and numbness.  All other systems reviewed and are negative.  Physical Exam Updated Vital Signs BP 121/89    Pulse 93    Temp 98 F (36.7 C) (Oral)    Resp 19    SpO2 96%  Physical Exam Vitals and nursing note reviewed.  Constitutional:      General: [name] is not in acute distress.    Appearance: Normal appearance. [name] is well-developed. [name] is not ill-appearing, toxic-appearing or diaphoretic.  HENT:     Head: Normocephalic and atraumatic.     Right Ear: External ear normal.     Left Ear: External ear normal.     Nose: Nose normal.     Mouth/Throat:     Mouth: Mucous membranes are moist.     Pharynx: Oropharynx is clear.  Eyes:     General: No scleral icterus.    Extraocular Movements: Extraocular movements intact.     Conjunctiva/sclera: Conjunctivae normal.  Cardiovascular:     Rate and Rhythm: Regular rhythm. Tachycardia present.     Heart sounds: No murmur heard. Pulmonary:     Effort: Pulmonary effort is normal. No respiratory distress.     Breath sounds: Normal breath sounds. No wheezing or rales.  Abdominal:     Palpations: Abdomen is soft.     Tenderness: There is no abdominal tenderness.  Musculoskeletal:        General: No swelling or tenderness.     Cervical back: Neck supple. No rigidity.     Right lower leg: No edema.     Left lower leg: No edema.  Skin:    General: Skin is warm and dry.     Capillary Refill: Capillary refill takes less than 2 seconds.     Coloration: Skin is not jaundiced or pale.  Neurological:     Mental Status: [name] is alert and oriented to person, place, and time.     Cranial Nerves: No cranial nerve deficit, dysarthria or facial asymmetry.     Sensory: Sensory deficit present.     Motor: Weakness present. No abnormal muscle  tone or pronator drift.     Coordination: Finger-Nose-Finger Test normal.     Comments: Slightly diminished strength and sensation to right upper and lower extremities  Psychiatric:        Mood and Affect: Mood normal.        Behavior: Behavior normal.    ED Results / Procedures / Treatments   Labs (all labs ordered are listed, but only abnormal results are displayed) Labs Reviewed  BASIC METABOLIC PANEL - Abnormal; Notable for the following components:      Result Value  Sodium 131 (*)    Chloride 96 (*)    Glucose, Bld 489 (*)    All other components within normal limits  CBC WITH DIFFERENTIAL/PLATELET - Abnormal; Notable for the following components:   WBC 10.9 (*)    All other components within normal limits  URINALYSIS, ROUTINE W REFLEX MICROSCOPIC - Abnormal; Notable for the following components:   Glucose, UA >=500 (*)    All other components within normal limits  OSMOLALITY - Abnormal; Notable for the following components:   Osmolality 303 (*)    All other components within normal limits  LACTIC ACID, PLASMA - Abnormal; Notable for the following components:   Lactic Acid, Venous 2.9 (*)    All other components within normal limits  RAPID URINE DRUG SCREEN, HOSP PERFORMED - Abnormal; Notable for the following components:   Amphetamines POSITIVE (*)    All other components within normal limits  CBG MONITORING, ED - Abnormal; Notable for the following components:   Glucose-Capillary 531 (*)    All other components within normal limits  CBG MONITORING, ED - Abnormal; Notable for the following components:   Glucose-Capillary 269 (*)    All other components within normal limits  BETA-HYDROXYBUTYRIC ACID  LACTIC ACID, PLASMA  LIPASE, BLOOD  MAGNESIUM  URINALYSIS, MICROSCOPIC (REFLEX)    EKG None  Radiology DG Chest 1 View  Result Date: 11/19/2021 CLINICAL DATA:  Hyperglycemia. EXAM: CHEST  1 VIEW COMPARISON:  10/20/2021 FINDINGS: The heart size and mediastinal  contours are within normal limits. Both lungs are clear. The visualized skeletal structures are unremarkable. IMPRESSION: No active disease. Electronically Signed   By: Kathreen Devoid M.D.   On: 11/19/2021 10:23   CT Head Wo Contrast  Result Date: 11/19/2021 CLINICAL DATA:  Right upper extremity numbness. EXAM: CT HEAD WITHOUT CONTRAST TECHNIQUE: Contiguous axial images were obtained from the base of the skull through the vertex without intravenous contrast. RADIATION DOSE REDUCTION: This exam was performed according to the departmental dose-optimization program which includes automated exposure control, adjustment of the mA and/or kV according to patient size and/or use of iterative reconstruction technique. COMPARISON:  August 27, 2009. FINDINGS: Brain: No evidence of acute infarction, hemorrhage, hydrocephalus, extra-axial collection or mass lesion/mass effect. Vascular: No hyperdense vessel or unexpected calcification. Skull: Normal. Negative for fracture or focal lesion. Sinuses/Orbits: No acute finding. Other: None. IMPRESSION: No acute intracranial abnormality seen. Electronically Signed   By: Marijo Conception M.D.   On: 11/19/2021 10:40   MR BRAIN WO CONTRAST  Result Date: 11/19/2021 CLINICAL DATA:  Acute neuro deficit. EXAM: MRI HEAD WITHOUT CONTRAST TECHNIQUE: Multiplanar, multiecho pulse sequences of the brain and surrounding structures were obtained without intravenous contrast. COMPARISON:  CT head 11/19/2021 FINDINGS: Brain: No acute infarction, hemorrhage, hydrocephalus, extra-axial collection or mass lesion. Vascular: Negative for hyperdense vessel Skull and upper cervical spine: No focal skeletal lesion. Sinuses/Orbits: Mucosal edema paranasal sinuses most prominent in the left frontal sinus. Mastoid clear. Negative orbit Other: None IMPRESSION: Negative MRI of the brain Sinus mucosal disease in the paranasal sinuses. Electronically Signed   By: Franchot Gallo M.D.   On: 11/19/2021 13:18     Procedures Procedures    Medications Ordered in ED Medications  lactated ringers bolus 2,066 mL (0 mLs Intravenous Stopped 11/19/21 1205)  diazepam (VALIUM) injection 5 mg (5 mg Intravenous Given 11/19/21 6945)    ED Course/ Medical Decision Making/ A&P  Medical Decision Making Amount and/or Complexity of Data Reviewed Labs: ordered. Radiology: ordered.  Risk Prescription drug management.   This patient presents to the ED for concern of hyperglycemia, numbness, weakness, this involves an extensive number of treatment options, and is a complaint that carries with it a high risk of complications and morbidity.  The differential diagnosis includes CVA, DKA, HHS, dehydration, acute infection   Co morbidities that complicate the patient evaluation  HLD, bipolar disorder, T2DM, anxiety, depression, remote alcohol and polysubstance abuse.   Additional history obtained:  Additional history obtained from N/A External records from outside source obtained and reviewed including EMR   Lab Tests:  I Ordered, and personally interpreted labs.  The pertinent results include: Hyperglycemia without evidence of DKA, very slight leukocytosis, glucosuria without evidence of UTI, normal electrolytes, drug screen positive for amphetamines consistent with his use of Adderall.   Imaging Studies ordered:  I ordered imaging studies including chest x-ray, CT head, MRI brain I independently visualized and interpreted imaging which showed no evidence of CVA/TIA.  Paranasal sinusitis present. I agree with the radiologist interpretation   Cardiac Monitoring:  The patient was maintained on a cardiac monitor.  I personally viewed and interpreted the cardiac monitored which showed an underlying rhythm of: Sinus rhythm   Medicines ordered and prescription drug management:  I ordered medication including IV fluids for dehydration and hyperglycemia; Valium for muscle  cramping Reevaluation of the patient after these medicines showed that the patient resolved I have reviewed the patients home medicines and have made adjustments as needed  Problem List / ED Course:  41 year old male presenting for hyperglycemia.  [name] also endorses episodes of recent syncope and right-sided numbness and weakness for the past 3 days.  On arrival in the ED, [name] is well-appearing.  [name] is tachycardic which I suspect is from dehydration from his ongoing hyperglycemia.  [name] states that his blood sugars at home are consistently in the range of 400-600.  This is despite home use of insulin.  2 L of IV fluid were ordered.  On exam, patient does have slight right hemibody weakness.  [name] also endorses diminished sensation to the right hemibody.  [name] has no facial asymmetry.  Coordination is intact.  [name] has no difficulty with speech.  Noncontrasted CT scan of head did not show any acute findings.  Given concern for subacute stroke, patient also underwent MRI brain which was also negative for evidence of CVA.  While in the ED, patient did endorse muscle cramping.  [name] was given dose of Valium.  Following this, [name] had complete resolution of his symptoms, including his numbness and weakness.  His lab work did not show any evidence of complications from his hyperglycemia.  Following his IV fluids, blood glucose improved to 269.  [name] was advised to refill his insulin prescription and to keep it refrigerated to ensure that it has good pharmacologic effect.  [name] would also benefit from PCP follow-up and diabetes education.  Amatory referral for diabetes education was ordered.  Patient stable for discharge at this time.  Reevaluation:  After the interventions noted above, I reevaluated the patient and found that they have :resolved   Social Determinants of Health:  Poor management of chronic illness   Dispostion:  After consideration of the diagnostic results and the patients response to treatment, I feel  that the patent would benefit from discharge with PCP follow-up and further diabetes education.          Final Clinical  Impression(s) / ED Diagnoses Final diagnoses:  Hyperglycemia    Rx / DC Orders ED Discharge Orders          Ordered    insulin lispro (HUMALOG KWIKPEN) 100 UNIT/ML KwikPen  3 times daily        11/19/21 1405    Ambulatory referral to Nutrition and Diabetic Education        11/19/21 1406              Godfrey Pick, MD 11/20/21 [phone]

## 2021-11-19 NOTE — ED Triage Notes (Signed)
Pt states checked sugar this am and was 400, took 11units regular after, then ate then stated checked sugar again and was 598. A/o. C/o dry heaving all morning. Then states he "passed out" 3 times Saturday per wife, states right side of body went limp Saturday and has been having numbness intermittent to right arm since. Mm dry. No neuro symptoms obviously present at this time

## 2021-11-19 NOTE — ED Notes (Signed)
Primary RN at bedside.

## 2021-11-25 ENCOUNTER — Ambulatory Visit: Payer: Medicare Other | Admitting: Nutrition

## 2021-11-26 DIAGNOSIS — I700 Atherosclerosis of aorta: Secondary | ICD-10-CM | POA: Diagnosis not present

## 2021-11-26 DIAGNOSIS — R110 Nausea: Secondary | ICD-10-CM | POA: Diagnosis not present

## 2021-11-26 DIAGNOSIS — R059 Cough, unspecified: Secondary | ICD-10-CM | POA: Diagnosis not present

## 2021-11-26 DIAGNOSIS — R052 Subacute cough: Secondary | ICD-10-CM | POA: Diagnosis not present

## 2021-11-26 DIAGNOSIS — R079 Chest pain, unspecified: Secondary | ICD-10-CM | POA: Diagnosis not present

## 2021-11-26 DIAGNOSIS — F17200 Nicotine dependence, unspecified, uncomplicated: Secondary | ICD-10-CM | POA: Diagnosis not present

## 2021-11-26 DIAGNOSIS — R1030 Lower abdominal pain, unspecified: Secondary | ICD-10-CM | POA: Diagnosis not present

## 2021-11-26 DIAGNOSIS — K529 Noninfective gastroenteritis and colitis, unspecified: Secondary | ICD-10-CM | POA: Diagnosis not present

## 2021-11-26 DIAGNOSIS — E119 Type 2 diabetes mellitus without complications: Secondary | ICD-10-CM | POA: Diagnosis not present

## 2021-11-26 DIAGNOSIS — R109 Unspecified abdominal pain: Secondary | ICD-10-CM | POA: Diagnosis not present

## 2021-11-26 DIAGNOSIS — G8929 Other chronic pain: Secondary | ICD-10-CM | POA: Diagnosis not present

## 2021-11-26 DIAGNOSIS — Z794 Long term (current) use of insulin: Secondary | ICD-10-CM | POA: Diagnosis not present

## 2021-11-26 DIAGNOSIS — K760 Fatty (change of) liver, not elsewhere classified: Secondary | ICD-10-CM | POA: Diagnosis not present

## 2021-11-27 DIAGNOSIS — M47816 Spondylosis without myelopathy or radiculopathy, lumbar region: Secondary | ICD-10-CM | POA: Diagnosis not present

## 2021-11-27 DIAGNOSIS — M545 Low back pain, unspecified: Secondary | ICD-10-CM | POA: Diagnosis not present

## 2021-11-28 DIAGNOSIS — Z79899 Other long term (current) drug therapy: Secondary | ICD-10-CM | POA: Diagnosis not present

## 2021-11-28 DIAGNOSIS — Z5181 Encounter for therapeutic drug level monitoring: Secondary | ICD-10-CM | POA: Diagnosis not present

## 2021-12-01 ENCOUNTER — Emergency Department (HOSPITAL_COMMUNITY)
Admission: EM | Admit: 2021-12-01 | Discharge: 2021-12-01 | Disposition: A | Discharge status: Home / Self Care | Payer: Medicare Other | Attending: Emergency Medicine | Admitting: Emergency Medicine

## 2021-12-01 ENCOUNTER — Other Ambulatory Visit: Payer: Self-pay

## 2021-12-01 ENCOUNTER — Encounter (HOSPITAL_COMMUNITY): Payer: Self-pay

## 2021-12-01 DIAGNOSIS — R109 Unspecified abdominal pain: Secondary | ICD-10-CM | POA: Diagnosis not present

## 2021-12-01 DIAGNOSIS — R1110 Vomiting, unspecified: Secondary | ICD-10-CM | POA: Diagnosis not present

## 2021-12-01 DIAGNOSIS — Z5321 Procedure and treatment not carried out due to patient leaving prior to being seen by health care provider: Secondary | ICD-10-CM | POA: Insufficient documentation

## 2021-12-01 NOTE — ED Triage Notes (Signed)
Pt c/o abdominal pain with vomiting

## 2021-12-01 NOTE — ED Notes (Signed)
Pt called x 3 .

## 2021-12-09 ENCOUNTER — Other Ambulatory Visit: Payer: Self-pay

## 2021-12-09 ENCOUNTER — Ambulatory Visit: Payer: Medicare Other | Admitting: Nurse Practitioner

## 2021-12-09 ENCOUNTER — Ambulatory Visit (INDEPENDENT_AMBULATORY_CARE_PROVIDER_SITE_OTHER): Payer: Medicare Other | Admitting: Nurse Practitioner

## 2021-12-09 ENCOUNTER — Encounter: Payer: Self-pay | Admitting: Nurse Practitioner

## 2021-12-09 VITALS — BP 117/71 | HR 85 | Ht 67.0 in | Wt 228.0 lb

## 2021-12-09 DIAGNOSIS — Z794 Long term (current) use of insulin: Secondary | ICD-10-CM

## 2021-12-09 DIAGNOSIS — E559 Vitamin D deficiency, unspecified: Secondary | ICD-10-CM | POA: Diagnosis not present

## 2021-12-09 DIAGNOSIS — E782 Mixed hyperlipidemia: Secondary | ICD-10-CM | POA: Diagnosis not present

## 2021-12-09 DIAGNOSIS — E1165 Type 2 diabetes mellitus with hyperglycemia: Secondary | ICD-10-CM | POA: Diagnosis not present

## 2021-12-09 MED ORDER — INSULIN LISPRO (1 UNIT DIAL) 100 UNIT/ML (KWIKPEN): 5.0000 [IU] | PEN_INJECTOR | Freq: Three times a day (TID) | SUBCUTANEOUS | 3 refills | Status: DC

## 2021-12-09 MED ORDER — GLIPIZIDE ER 5 MG PO TB24: 5.0000 mg | ORAL_TABLET | Freq: Every day | ORAL | 3 refills | Status: DC

## 2021-12-09 MED ORDER — "INSULIN SYRINGE 29G X 1/2"" 0.5 ML MISC": 3 refills | Status: DC

## 2021-12-09 MED ORDER — INSULIN GLARGINE 100 UNIT/ML ~~LOC~~ SOLN: 40.0000 [IU] | Freq: Every day | SUBCUTANEOUS | 3 refills | Status: DC

## 2021-12-09 MED ORDER — HUMALOG 100 UNIT/ML ~~LOC~~ SOLN: 5.0000 [IU] | Freq: Three times a day (TID) | SUBCUTANEOUS | 3 refills | Status: DC

## 2021-12-09 NOTE — Progress Notes (Signed)
Endocrinology Follow Up Note       12/09/2021, 4:39 PM   Subjective:    Patient ID: [name], male    DOB: 1981/06/10.  [name] is being seen in follow up after being seen in consultation for management of currently uncontrolled symptomatic diabetes requested by  [place].   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[place])    Bipolar disorder ([place])    On disability   Depression    Diabetes mellitus without complication ([place])    per patient : under control with diet, does not monitor cbg at home    GERD (gastroesophageal reflux disease)    Headache(784.0)    daily headaches   Hyperlipidemia    Neuromuscular disorder ([place])    Beckers muscular dystrophy   Obesity    PTSD (post-traumatic stress disorder)    Sleep apnea    Does not tolerate CPAP    Past Surgical History:  Procedure Laterality Date   APPLICATION OF WOUND VAC N/A 11/23/2018   Procedure: Application Of Wound Vac;  Surgeon: Kinsinger, Arta Bruce, MD;  Location: [facility];  Service: General;  Laterality: N/A;   COLECTOMY N/A 11/23/2018   Procedure: TOTAL COLECTOMY;  Surgeon: Kinsinger, Arta Bruce, MD;  Location: [facility];  Service: General;  Laterality: N/A;   ILEOSTOMY N/A 11/23/2018   Procedure: ILEOSTOMY;  Surgeon: Kinsinger, Arta Bruce, MD;  Location: [facility];  Service: General;  Laterality: N/A;   ILEOSTOMY CLOSURE N/A 05/19/2019   Procedure: ILEOSTOMY REVERSAL WITH ILEOCOLIC AND COLORECTAL ANASTOMOSIS;  Surgeon: Kinsinger, Arta Bruce, MD;  Location: [facility];  Service: General;  Laterality: N/A;   [place] N/A 01/04/2021   Procedure: LAPAROSCOPIC INCISIONAL HERNIA REPAIR WITH MESH; LYSIS OF ADHESIONS;  Surgeon: Kieth Brightly, Arta Bruce, MD;  Location: [facility];  Service: General;  Laterality: N/A;   IR GUIDED DRAIN W CATHETER PLACEMENT  02/14/2021   IR RADIOLOGIST EVAL & MGMT   02/19/2021   IR RADIOLOGIST EVAL & MGMT  03/21/2021   IR RADIOLOGIST EVAL & MGMT  04/03/2021   IR RADIOLOGIST EVAL & MGMT  04/17/2021   NM MYOCAR PERF WALL MOTION  01/22/2011   protocol: Persantine, moderate reversible inferior defect post stress EF 48%, high risk scan   TONSILLECTOMY     TRANSTHORACIC ECHOCARDIOGRAM  07/05/2004   EF=>55% normal study     Social History   Socioeconomic History   Marital status: Married    Spouse name: Not on file   Number of children: 2   Years of education: 12   Highest education level: Not on file  Occupational History    Employer: UNEMPLOYED  Tobacco Use   Smoking status: Every Day    Packs/day: 1.50    Years: 21.00    Pack years: 31.50    Types: Cigarettes   Smokeless tobacco: Never  Vaping Use   Vaping Use: Former   Devices: Increased nicotine cravings  Substance and Sexual Activity   Alcohol use: No    Alcohol/week: 0.0 standard drinks    Comment: quit 2013   Drug use: Not Currently  Types: Marijuana    Comment: 2009 last use   Sexual activity: Yes    Birth control/protection: None  Other Topics Concern   Not on file  Social History Narrative   Patient lives at home with parents, brother and brothers wife.    Patient is single.    Patient has 2 children.    Patient has a high school education.    Patient is unemployed.    Patient is right handed.    Social Determinants of Health   Financial Resource Strain: Not on file  Food Insecurity: Not on file  Transportation Needs: Not on file  Physical Activity: Not on file  Stress: Not on file  Social Connections: Not on file    Family History  Problem Relation Age of Onset   COPD Mother    Depression Mother    Diabetes Mother    Hyperlipidemia Mother    Asthma Father    Arthritis Father    Diabetes Father    Heart disease Father    Hyperlipidemia Father    Hypertension Father    Hypertension Brother     Outpatient Encounter Medications as of 12/09/2021  Medication  Sig   atorvastatin (LIPITOR) 40 MG tablet Take 1 tablet (40 mg total) by mouth daily.   Blood Glucose Monitoring Suppl (ONE TOUCH ULTRA 2) w/Device KIT Measure blood sugar daily and with meals for insulin   cloNIDine HCl (KAPVAY) 0.1 MG TB12 ER tablet Take 0.1 mg by mouth daily.   glipiZIDE (GLUCOTROL XL) 5 MG 24 hr tablet Take 1 tablet (5 mg total) by mouth daily with breakfast.   glucose blood (ONETOUCH ULTRA) test strip Use as instructed to monitor glucose 4 times daily   Ibuprofen-diphenhydrAMINE Cit (ADVIL PM PO) Take 1 tablet by mouth at bedtime as needed (sleep).   insulin glargine (LANTUS) 100 UNIT/ML injection Inject 0.4 mLs (40 Units total) into the skin at bedtime.   insulin lispro (HUMALOG) 100 UNIT/ML injection Inject 0.05-0.11 mLs (5-11 Units total) into the skin 3 (three) times daily with meals.   Insulin Pen Needle 32G X 6 MM MISC 1 packet by Does not apply route 2 (two) times daily.   INSULIN SYRINGE .5CC/29G 29G X 1/2" 0.5 ML MISC Use to inject insulin 4 times daily   Lancets (ONETOUCH ULTRASOFT) lancets Use as instructed to monitor glucose 4 times daily   LATUDA 60 MG TABS Take 1 tablet by mouth daily.   loperamide (IMODIUM A-D) 2 MG tablet Take 1 tablet (2 mg total) by mouth 4 (four) times daily as needed for diarrhea or loose stools.   LORazepam (ATIVAN) 1 MG tablet Take 0.5-1 mg by mouth daily as needed for anxiety.   omeprazole (PRILOSEC) 20 MG capsule TAKE 2 CAPSULES (40 MG TOTAL) BY MOUTH AT BEDTIME. (Patient taking differently: Take 40 mg by mouth daily.)   potassium chloride SA (KLOR-CON M) 20 MEQ tablet Take 20 mEq by mouth daily.   propranolol (INDERAL) 20 MG tablet SMARTSIG:0.5-1 Tablet(s) By Mouth 2-3 Times Daily PRN   tiZANidine (ZANAFLEX) 4 MG tablet Take 4 mg by mouth 2 (two) times daily as needed for muscle spasms.   traZODone (DESYREL) 100 MG tablet Take 50-100 mg by mouth at bedtime.   VYVANSE 20 MG capsule Take 20 mg by mouth daily with breakfast. Take with  50 mg for a total of 70 mg   VYVANSE 50 MG capsule Take 50 mg by mouth See admin instructions. Take with 20 mg for  a total of 70 mg in the morning   [DISCONTINUED] insulin glargine (LANTUS SOLOSTAR) 100 UNIT/ML Solostar Pen Inject 40 Units into the skin at bedtime. INJECT 40 UNITS INTO THE SKIN EVERY MORNING AS DIRECTED (Patient taking differently: Inject 40 Units into the skin at bedtime.)   FLUoxetine (PROZAC) 40 MG capsule Take 40 mg by mouth at bedtime. (Patient not taking: Reported on 12/09/2021)   [DISCONTINUED] insulin lispro (HUMALOG KWIKPEN) 100 UNIT/ML KwikPen Inject 5-11 Units into the skin 3 (three) times daily. (Patient not taking: Reported on 12/09/2021)   [DISCONTINUED] insulin lispro (HUMALOG KWIKPEN) 100 UNIT/ML KwikPen Inject 5-11 Units into the skin 3 (three) times daily.   No facility-administered encounter medications on file as of 12/09/2021.    ALLERGIES: Allergies  Allergen Reactions   Metformin And Related Other (See Comments)    Stomach cramps,  Throat closing sensation, could not swallow   Penicillins Anaphylaxis, Hives, Shortness Of Breath and Swelling    Did it involve swelling of the face/tongue/throat, SOB, or low BP? Yes Did it involve sudden or severe rash/hives, skin peeling, or any reaction on the inside of your mouth or nose? Unk Did you need to seek medical attention at a hospital or doctor's office? No When did it last happen? Unk  If all above answers are "NO", may proceed with cephalosporin use. Got ceftriaxone before    Nsaids Other (See Comments)    States he is not supposed to take nsaids because of his colon  Other reaction(s): Other (See Comments) States he is not supposed to take nsaids because of his colon  Other reaction(s): Headache, Other (See Comments) States he is not supposed to take nsaids because of his colon     VACCINATION STATUS: Immunization History  Administered Date(s) Administered   Influenza Inj Mdck Quad Pf 07/12/2018    Influenza Split 07/24/2011, 07/23/2012   Influenza Whole 08/04/2007, 07/10/2008, 07/25/2009, 08/01/2010   Influenza,inj,Quad PF,6+ Mos 07/19/2013, 07/04/2014, 07/19/2015, 06/24/2016, 07/28/2017, 07/12/2019   Moderna Sars-Covid-2 Vaccination 05/02/2020, 05/30/2020, 07/15/2021   Pneumococcal Polysaccharide-23 07/08/2016   Td 07/24/2003   Tdap 07/08/2016    Diabetes He presents for his follow-up diabetic visit. He has type 2 diabetes mellitus. Onset time: Diagnosed at approx age of 68. His disease course has been worsening. There are no hypoglycemic associated symptoms. Associated symptoms include fatigue, polydipsia and polyuria. There are no hypoglycemic complications. Symptoms are stable. There are no diabetic complications. Risk factors for coronary artery disease include diabetes mellitus, dyslipidemia, family history, male sex, obesity, sedentary lifestyle and tobacco exposure. Current diabetic treatment includes insulin injections. He is compliant with treatment some of the time (Has not been taking the Humalog). His weight is fluctuating minimally. He is following a generally unhealthy diet. When asked about meal planning, he reported none. He has not had a previous visit with a dietitian. He participates in exercise intermittently. His home blood glucose trend is fluctuating minimally. His breakfast blood glucose range is generally >200 mg/dl. His lunch blood glucose range is generally >200 mg/dl. His dinner blood glucose range is generally >200 mg/dl. His bedtime blood glucose range is generally >200 mg/dl. His overall blood glucose range is >200 mg/dl. (He presents today, accompanied by his wife, with his logs showing readings that are suspicious for being fabricated.  He has been to the hospital several time since last visit for various complaints including pain and hyperglycemia.  He says the ED physician told him to stop the Humalog as it may have been a  bad batch since his glucose went higher  after taking it.  He is asking about taking pills for his diabetes instead of insulin and also is inquiring about a CGM device.  He continues to drink sodas, energy drinks, and SF gatorade which is preventing him from gaining optimal control of his diabetes.) An ACE inhibitor/angiotensin II receptor blocker is not being taken. He does not see a podiatrist.Eye exam is current.  Hyperlipidemia This is a chronic problem. The current episode started more than 1 year ago. The problem is uncontrolled. Recent lipid tests were reviewed and are high. Exacerbating diseases include diabetes and obesity. Factors aggravating his hyperlipidemia include fatty foods and smoking. Current antihyperlipidemic treatment includes statins. The current treatment provides mild improvement of lipids. Compliance problems include adherence to diet and adherence to exercise.  Risk factors for coronary artery disease include diabetes mellitus, dyslipidemia, family history, male sex, obesity and a sedentary lifestyle.    Review of systems  Constitutional: + Minimally fluctuating body weight, current Body mass index is 35.71 kg/m., + fatigue, no subjective hyperthermia, no subjective hypothermia Eyes: no blurry vision, no xerophthalmia ENT: no sore throat, no nodules palpated in throat, no dysphagia/odynophagia, no hoarseness Cardiovascular: no chest pain, no shortness of breath, no palpitations, no leg swelling Respiratory: no cough, no shortness of breath Gastrointestinal: no nausea/vomiting/diarrhea; intermittent digestive issues Genitourinary: + polyuria Musculoskeletal: chronic pain Skin: no rashes, no hyperemia Neurological: no tremors, no numbness, no tingling, no dizziness- has history of MS- sees new neurologist soon Psychiatric: no depression, no anxiety  Objective:     BP 117/71    Pulse 85    Ht 5\' 7"  (1.702 m)    Wt 228 lb (103.4 kg)    SpO2 98%    BMI 35.71 kg/m   Wt Readings from Last 3 Encounters:   12/09/21 228 lb (103.4 kg)  11/05/21 227 lb 12.8 oz (103.3 kg)  10/28/21 232 lb (105.2 kg)     BP Readings from Last 3 Encounters:  12/09/21 117/71  11/19/21 121/89  11/05/21 129/80      Physical Exam- Limited  Constitutional:  Body mass index is 35.71 kg/m. , not in acute distress, restless, cannot sit still Eyes:  EOMI, no exophthalmos Neck: Supple Cardiovascular: RRR, no murmurs, rubs, or gallops, no edema Respiratory: Adequate breathing efforts, no crackles, rales, rhonchi, or wheezing Musculoskeletal: no gross deformities, strength intact in all four extremities, no gross restriction of joint movements Skin:  no rashes, no hyperemia, multiple tattoos Neurological: no tremor with outstretched hands    CMP ( most recent) CMP     Component Value Date/Time   NA 131 (L) 11/19/2021 0929   NA 133 (L) 02/24/2021 1355   K 3.7 11/19/2021 0929   CL 96 (L) 11/19/2021 0929   CO2 24 11/19/2021 0929   GLUCOSE 489 (H) 11/19/2021 0929   BUN 10 11/19/2021 0929   BUN 9 09/18/2021 0000   CREATININE 0.90 11/19/2021 0929   CREATININE 0.98 04/28/2016 1454   CALCIUM 9.4 11/19/2021 0929   PROT 8.1 09/10/2021 1945   PROT 7.6 02/24/2021 1355   ALBUMIN 4.4 09/10/2021 1945   ALBUMIN 4.1 02/24/2021 1355   AST 26 09/10/2021 1945   ALT 40 09/10/2021 1945   ALKPHOS 103 09/10/2021 1945   BILITOT 0.5 09/10/2021 1945   BILITOT 0.3 02/24/2021 1355   GFRNONAA >60 11/19/2021 0929   GFRNONAA >89 04/28/2016 1454   GFRAA 126 11/21/2020 1358   GFRAA >89 04/28/2016 1454  Diabetic Labs (most recent): Lab Results  Component Value Date   HGBA1C 13.9 09/18/2021   HGBA1C 12.8 (A) 08/22/2021   HGBA1C 9.6 (H) 12/25/2020     Lipid Panel ( most recent) Lipid Panel     Component Value Date/Time   CHOL 217 (H) 11/23/2019 1208   TRIG 433 (A) 09/18/2021 0000   HDL 33 (L) 11/23/2019 1208   CHOLHDL 6.6 (H) 11/23/2019 1208   CHOLHDL 11.7 (H) 12/11/2015 1423   VLDL 40 (H) 12/11/2015 1423    LDLCALC 91 09/18/2021 0000   LDLCALC 124 (H) 11/23/2019 1208   LDLDIRECT 63 07/23/2012 1552   LABVLDL 60 (H) 11/23/2019 1208               Assessment & Plan:   1) Type 2 diabetes mellitus with hyperglycemia, with long-term current use of insulin ([place])  He presents today, accompanied by his wife, with his logs showing readings that are suspicious for being fabricated.  He has been to the hospital several time since last visit for various complaints including pain and hyperglycemia.  He says the ED physician told him to stop the Humalog as it may have been a bad batch since his glucose went higher after taking it.  He is asking about taking pills for his diabetes instead of insulin and also is inquiring about a CGM device.  He continues to drink sodas, energy drinks, and SF gatorade which is preventing him from gaining optimal control of his diabetes.  - [name] has currently uncontrolled symptomatic type 2 DM since 41 years of age, with most recent A1c of 13.9 %.   -Recent labs reviewed.  - I had a long discussion with him about the progressive nature of diabetes and the pathology behind its complications. -his diabetes is not currently complicated but he remains at a high risk for more acute and chronic complications which include CAD, CVA, CKD, retinopathy, and neuropathy. These are all discussed in detail with him.  - Nutritional counseling repeated at each appointment due to patients tendency to fall back in to old habits.  - The patient admits there is a room for improvement in their diet and drink choices. -  Suggestion is made for the patient to avoid simple carbohydrates from their diet including Cakes, Sweet Desserts / Pastries, Ice Cream, Soda (diet and regular), Sweet Tea, Candies, Chips, Cookies, Sweet Pastries, Store Bought Juices, Alcohol in Excess of 1-2 drinks a day, Artificial Sweeteners, Coffee Creamer, and "Sugar-free" Products. This will help patient to have stable  blood glucose profile and potentially avoid unintended weight gain.   - I encouraged the patient to switch to unprocessed or minimally processed complex starch and increased protein intake (animal or plant source), fruits, and vegetables.   - Patient is advised to stick to a routine mealtimes to eat 3 meals a day and avoid unnecessary snacks (to snack only to correct hypoglycemia).  - he will be scheduled with Jearld Fenton, RDN, CDE for diabetes education.  - I have approached him with the following individualized plan to manage his diabetes and patient agrees:   -He is advised to continue his Lantus at 40 units SQ nightly.  I discussed and restarted Humalog 5-11 units TID with meals if glucose is above 90 and he is eating (Specific instructions on how to titrate insulin dosage based on glucose readings given to patient in writing).  I also started him on Glipizide 5 mg XL daily with breakfast.  He asked  to switch to vials, thinks he could self inject better that way.  -he is encouraged to continue monitoring glucose 4 times daily, before meals and before bed, and to call the clinic if he has readings less than 70 or above 300 for 3 tests in a row.  I will send in Rx for CGM to [facility].  A CGM could greatly benefit him by allowing him to see how his glucose reacts to certain foods.  - he is warned not to take insulin without proper monitoring per orders. - Adjustment parameters are given to him for hypo and hyperglycemia in writing.  - his Vania Rea will be discontinued, risk outweighs benefit for this patient, especially given his complaint of polyuria.  - he is not an ideal candidate for incretin therapy due to increased triglycerides increasing his risk of pancreatitis (which looks like he has had recently- found to have elevated lipase levels in ED).  - Specific targets for  A1c; LDL, HDL, and Triglycerides were discussed with the patient.  2) Blood Pressure /Hypertension:  his blood  pressure is controlled to target without the use of antihypertensive medications.  3) Lipids/Hyperlipidemia:    Review of his recent lipid panel from 09/18/21 showed controlled LDL at 91 and significantly elevated triglycerides of 433 .  he is advised to continue Lipitor 40 mg daily at bedtime.  Side effects and precautions discussed with him.  We also discussed avoiding fried foods and butter.  4)  Weight/Diet:  his Body mass index is 35.71 kg/m.  -  clearly complicating his diabetes care.   he is a candidate for weight loss. I discussed with him the fact that loss of 5 - 10% of his  current body weight will have the most impact on his diabetes management.  Exercise, and detailed carbohydrates information provided  -  detailed on discharge instructions.  5) Chronic Care/Health Maintenance: -he is not on ACEI/ARB and is on Statin medications and is encouraged to initiate and continue to follow up with Ophthalmology, Dentist, Podiatrist at least yearly or according to recommendations, and advised to [place] (expresses he is not ready to quit). I have recommended yearly flu vaccine and pneumonia vaccine at least every 5 years; moderate intensity exercise for up to 150 minutes weekly; and sleep for at least 7 hours a day.  - he is advised to maintain close follow up with [facility] for primary care needs, as well as his other providers for optimal and coordinated care.     I spent 42 minutes in the care of the patient today including review of labs from [facility], Lipids, Thyroid Function, Hematology (current and previous including abstractions from other facilities); face-to-face time discussing  his blood glucose readings/logs, discussing hypoglycemia and hyperglycemia episodes and symptoms, medications doses, his options of short and long term treatment based on the latest standards of care / guidelines;  discussion about incorporating lifestyle medicine;  and documenting the  encounter.    Please refer to Patient Instructions for Blood Glucose Monitoring and Insulin/Medications Dosing Guide"  in media tab for additional information. Please  also refer to " Patient Self Inventory" in the Media  tab for reviewed elements of pertinent patient history.  [place] participated in the discussions, expressed understanding, and voiced agreement with the above plans.  All questions were answered to his satisfaction. he is encouraged to contact clinic should he have any questions or concerns prior to his return visit.  Follow up plan: - Return in about 1 month (around 01/06/2022) for Diabetes F/U, Bring meter and logs, No previsit labs.   Rayetta Pigg, [facility] [address] [place], [place] [address] Phone: [phone] Fax: [phone]  12/09/2021, 4:39 PM

## 2021-12-09 NOTE — Patient Instructions (Signed)

## 2021-12-10 DIAGNOSIS — Z713 Dietary counseling and surveillance: Secondary | ICD-10-CM | POA: Diagnosis not present

## 2021-12-10 DIAGNOSIS — K219 Gastro-esophageal reflux disease without esophagitis: Secondary | ICD-10-CM | POA: Diagnosis not present

## 2021-12-10 DIAGNOSIS — J302 Other seasonal allergic rhinitis: Secondary | ICD-10-CM | POA: Diagnosis not present

## 2021-12-10 DIAGNOSIS — R0602 Shortness of breath: Secondary | ICD-10-CM | POA: Diagnosis not present

## 2021-12-10 DIAGNOSIS — R109 Unspecified abdominal pain: Secondary | ICD-10-CM | POA: Diagnosis not present

## 2021-12-10 DIAGNOSIS — E1165 Type 2 diabetes mellitus with hyperglycemia: Secondary | ICD-10-CM | POA: Diagnosis not present

## 2021-12-10 DIAGNOSIS — F17200 Nicotine dependence, unspecified, uncomplicated: Secondary | ICD-10-CM | POA: Diagnosis not present

## 2021-12-10 DIAGNOSIS — E785 Hyperlipidemia, unspecified: Secondary | ICD-10-CM | POA: Diagnosis not present

## 2021-12-10 DIAGNOSIS — Z7182 Exercise counseling: Secondary | ICD-10-CM | POA: Diagnosis not present

## 2021-12-11 DIAGNOSIS — E1165 Type 2 diabetes mellitus with hyperglycemia: Secondary | ICD-10-CM | POA: Diagnosis not present

## 2021-12-18 DIAGNOSIS — I700 Atherosclerosis of aorta: Secondary | ICD-10-CM | POA: Diagnosis not present

## 2021-12-18 DIAGNOSIS — E785 Hyperlipidemia, unspecified: Secondary | ICD-10-CM | POA: Diagnosis not present

## 2021-12-18 DIAGNOSIS — K760 Fatty (change of) liver, not elsewhere classified: Secondary | ICD-10-CM | POA: Diagnosis not present

## 2021-12-18 DIAGNOSIS — Z794 Long term (current) use of insulin: Secondary | ICD-10-CM | POA: Diagnosis not present

## 2021-12-18 DIAGNOSIS — Z886 Allergy status to analgesic agent status: Secondary | ICD-10-CM | POA: Diagnosis not present

## 2021-12-18 DIAGNOSIS — F17200 Nicotine dependence, unspecified, uncomplicated: Secondary | ICD-10-CM | POA: Diagnosis not present

## 2021-12-18 DIAGNOSIS — Z9109 Other allergy status, other than to drugs and biological substances: Secondary | ICD-10-CM | POA: Diagnosis not present

## 2021-12-18 DIAGNOSIS — E119 Type 2 diabetes mellitus without complications: Secondary | ICD-10-CM | POA: Diagnosis not present

## 2021-12-18 DIAGNOSIS — K219 Gastro-esophageal reflux disease without esophagitis: Secondary | ICD-10-CM | POA: Diagnosis not present

## 2021-12-18 DIAGNOSIS — R110 Nausea: Secondary | ICD-10-CM | POA: Diagnosis not present

## 2021-12-18 DIAGNOSIS — R109 Unspecified abdominal pain: Secondary | ICD-10-CM | POA: Diagnosis not present

## 2021-12-18 DIAGNOSIS — R1032 Left lower quadrant pain: Secondary | ICD-10-CM | POA: Diagnosis not present

## 2021-12-18 DIAGNOSIS — N200 Calculus of kidney: Secondary | ICD-10-CM | POA: Diagnosis not present

## 2021-12-18 DIAGNOSIS — Z880 Allergy status to penicillin: Secondary | ICD-10-CM | POA: Diagnosis not present

## 2021-12-23 DIAGNOSIS — Z1159 Encounter for screening for other viral diseases: Secondary | ICD-10-CM | POA: Diagnosis not present

## 2021-12-23 DIAGNOSIS — G8929 Other chronic pain: Secondary | ICD-10-CM | POA: Diagnosis not present

## 2021-12-23 DIAGNOSIS — E1165 Type 2 diabetes mellitus with hyperglycemia: Secondary | ICD-10-CM | POA: Diagnosis not present

## 2021-12-23 DIAGNOSIS — M129 Arthropathy, unspecified: Secondary | ICD-10-CM | POA: Diagnosis not present

## 2021-12-23 DIAGNOSIS — R1084 Generalized abdominal pain: Secondary | ICD-10-CM | POA: Diagnosis not present

## 2021-12-23 DIAGNOSIS — E559 Vitamin D deficiency, unspecified: Secondary | ICD-10-CM | POA: Diagnosis not present

## 2021-12-23 DIAGNOSIS — Z79899 Other long term (current) drug therapy: Secondary | ICD-10-CM | POA: Diagnosis not present

## 2021-12-23 DIAGNOSIS — F17210 Nicotine dependence, cigarettes, uncomplicated: Secondary | ICD-10-CM | POA: Diagnosis not present

## 2021-12-23 DIAGNOSIS — Z87891 Personal history of nicotine dependence: Secondary | ICD-10-CM | POA: Diagnosis not present

## 2021-12-24 ENCOUNTER — Ambulatory Visit: Payer: Medicaid Other | Admitting: Nutrition

## 2021-12-28 DIAGNOSIS — E119 Type 2 diabetes mellitus without complications: Secondary | ICD-10-CM | POA: Diagnosis not present

## 2021-12-28 DIAGNOSIS — Z794 Long term (current) use of insulin: Secondary | ICD-10-CM | POA: Diagnosis not present

## 2021-12-28 DIAGNOSIS — F17200 Nicotine dependence, unspecified, uncomplicated: Secondary | ICD-10-CM | POA: Diagnosis not present

## 2021-12-28 DIAGNOSIS — Z7984 Long term (current) use of oral hypoglycemic drugs: Secondary | ICD-10-CM | POA: Diagnosis not present

## 2021-12-28 DIAGNOSIS — M25561 Pain in right knee: Secondary | ICD-10-CM | POA: Diagnosis not present

## 2021-12-28 DIAGNOSIS — Z880 Allergy status to penicillin: Secondary | ICD-10-CM | POA: Diagnosis not present

## 2021-12-30 DIAGNOSIS — R1084 Generalized abdominal pain: Secondary | ICD-10-CM | POA: Diagnosis not present

## 2021-12-30 DIAGNOSIS — E1165 Type 2 diabetes mellitus with hyperglycemia: Secondary | ICD-10-CM | POA: Diagnosis not present

## 2021-12-30 DIAGNOSIS — F17210 Nicotine dependence, cigarettes, uncomplicated: Secondary | ICD-10-CM | POA: Diagnosis not present

## 2021-12-30 DIAGNOSIS — G8929 Other chronic pain: Secondary | ICD-10-CM | POA: Diagnosis not present

## 2021-12-30 DIAGNOSIS — Z79899 Other long term (current) drug therapy: Secondary | ICD-10-CM | POA: Diagnosis not present

## 2021-12-30 DIAGNOSIS — Z87891 Personal history of nicotine dependence: Secondary | ICD-10-CM | POA: Diagnosis not present

## 2021-12-30 DIAGNOSIS — M1711 Unilateral primary osteoarthritis, right knee: Secondary | ICD-10-CM | POA: Diagnosis not present

## 2022-01-02 ENCOUNTER — Ambulatory Visit
Admission: EM | Admit: 2022-01-02 | Discharge: 2022-01-02 | Disposition: A | Discharge status: Home / Self Care | Payer: Medicare Other

## 2022-01-02 ENCOUNTER — Other Ambulatory Visit: Payer: Self-pay

## 2022-01-02 DIAGNOSIS — L03039 Cellulitis of unspecified toe: Secondary | ICD-10-CM | POA: Diagnosis not present

## 2022-01-02 MED ORDER — SULFAMETHOXAZOLE-TRIMETHOPRIM 800-160 MG PO TABS: 1.0000 | ORAL_TABLET | Freq: Two times a day (BID) | ORAL | 0 refills | Status: AC

## 2022-01-02 NOTE — ED Provider Notes (Signed)
?[name] ? ? ? ?CSN: [id number] ?Arrival date & time: 01/02/22  1507 ? ? ?  ? ?History   ?Chief Complaint ?Chief Complaint  ?Patient presents with  ? Toe Pain  ? ? ?HPI ?[name] is a 41 y.o. male.  ? ?Presenting today with several day history of right fourth toe pain, redness at the nail bed.  He states no injury to the area, no numbness, tingling, drainage, fever, chills.  Has not tried anything over-the-counter for symptoms. ? ? ?Past Medical History:  ?Diagnosis Date  ? Allergy   ? Anxiety   ? 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 Bipolar disorder ([place])   ? On disability  ? Depression   ? Diabetes mellitus without complication ([place])   ? per patient : under control with diet, does not monitor cbg at home   ? GERD (gastroesophageal reflux disease)   ? Headache(784.0)   ? daily headaches  ? Hyperlipidemia   ? Neuromuscular disorder ([place])   ? Beckers muscular dystrophy  ? Obesity   ? PTSD (post-traumatic stress disorder)   ? Sleep apnea   ? Does not tolerate CPAP  ? ? ?Patient Active Problem List  ? Diagnosis Date Noted  ? COVID-19 06/04/2021  ? Abdominal wall abscess 03/06/2021  ? Insomnia 02/06/2021  ? Incisional hernia 01/04/2021  ? Ventral hernia 11/21/2020  ? Anxiety   ? Shoulder injury, subsequent encounter 06/29/2020  ? High priority for COVID-19 virus vaccination 03/29/2020  ? Type 2 diabetes mellitus with hyperglycemia ([name]) 03/01/2020  ? Abdominal pain 11/22/2018  ? Memory difficulty 10/04/2018  ? Erectile dysfunction 01/29/2018  ? Neck pain 11/12/2016  ? Back pain 10/25/2016  ? Allergic rhinitis 04/28/2016  ? GERD (gastroesophageal reflux disease) 03/21/2013  ? Elevated BP 04/25/2012  ? Narcotic abuse ([place]) 06/05/2011  ? OBESITY 10/17/2010  ? 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  ? SLEEP APNEA 01/24/2009  ? BIPOLAR DISORDER UNSPECIFIED 05/05/2008  ? HYPERLIPIDEMIA 03/30/2008  ? ABUSE, ALCOHOL, UNSPECIFIED 04/26/2007  ? TOBACCO ABUSE 12/25/2006  ? ? ?Past Surgical History:  ?Procedure Laterality  Date  ? APPLICATION OF WOUND VAC N/A 11/23/2018  ? Procedure: Application Of Wound Vac;  Surgeon: Kinsinger, Arta Bruce, MD;  Location: [facility];  Service: General;  Laterality: N/A;  ? COLECTOMY N/A 11/23/2018  ? Procedure: TOTAL COLECTOMY;  Surgeon: Kinsinger, Arta Bruce, MD;  Location: [facility];  Service: General;  Laterality: N/A;  ? ILEOSTOMY N/A 11/23/2018  ? Procedure: ILEOSTOMY;  Surgeon: Kinsinger, Arta Bruce, MD;  Location: [facility];  Service: General;  Laterality: N/A;  ? ILEOSTOMY CLOSURE N/A 05/19/2019  ? Procedure: ILEOSTOMY REVERSAL WITH ILEOCOLIC AND COLORECTAL ANASTOMOSIS;  Surgeon: Kieth Brightly, Arta Bruce, MD;  Location: [facility];  Service: General;  Laterality: N/A;  ? INCISIONAL HERNIA REPAIR N/A 01/04/2021  ? Procedure: LAPAROSCOPIC INCISIONAL HERNIA REPAIR WITH MESH; LYSIS OF ADHESIONS;  Surgeon: Kinsinger, Arta Bruce, MD;  Location: [facility];  Service: General;  Laterality: N/A;  ? IR GUIDED DRAIN W CATHETER PLACEMENT  02/14/2021  ? IR RADIOLOGIST EVAL & MGMT  02/19/2021  ? IR RADIOLOGIST EVAL & MGMT  03/21/2021  ? IR RADIOLOGIST EVAL & MGMT  04/03/2021  ? IR RADIOLOGIST EVAL & MGMT  04/17/2021  ? NM MYOCAR PERF WALL MOTION  01/22/2011  ? protocol: Persantine, moderate reversible inferior defect post stress EF 48%, high risk scan  ? TONSILLECTOMY    ? TRANSTHORACIC ECHOCARDIOGRAM  07/05/2004  ? EF=>55% normal study   ? ? ? ? ? ?  Home Medications   ? ?Prior to Admission medications   ?Medication Sig Start Date End Date Taking? Authorizing Provider  ?sulfamethoxazole-trimethoprim (BACTRIM DS) 800-160 MG tablet Take 1 tablet by mouth 2 (two) times daily for 7 days. 01/02/22 01/09/22 Yes Volney American, PA-C  ?atorvastatin (LIPITOR) 40 MG tablet Take 1 tablet (40 mg total) by mouth daily. 08/22/21   Shary Key, DO  ?Blood Glucose Monitoring Suppl (ONE TOUCH ULTRA 2) w/Device KIT Measure blood sugar daily and with meals for insulin 10/22/21   Brita Romp, NP  ?cloNIDine HCl (KAPVAY) 0.1 MG TB12 ER tablet Take  0.1 mg by mouth daily. 09/18/21   [provider]  ?FLUoxetine (PROZAC) 40 MG capsule Take 40 mg by mouth at bedtime. ?Patient not taking: Reported on 12/09/2021 12/03/21   [provider]  ?glipiZIDE (GLUCOTROL XL) 5 MG 24 hr tablet Take 1 tablet (5 mg total) by mouth daily with breakfast. 12/09/21   Brita Romp, NP  ?glucose blood (ONETOUCH ULTRA) test strip Use as instructed to monitor glucose 4 times daily 10/22/21   Brita Romp, NP  ?Ibuprofen-diphenhydrAMINE Cit (ADVIL PM PO) Take 1 tablet by mouth at bedtime as needed (sleep).    [provider]  ?insulin glargine (LANTUS) 100 UNIT/ML injection Inject 0.4 mLs (40 Units total) into the skin at bedtime. 12/09/21   Brita Romp, NP  ?insulin lispro (HUMALOG) 100 UNIT/ML injection Inject 0.05-0.11 mLs (5-11 Units total) into the skin 3 (three) times daily with meals. 12/09/21   Brita Romp, NP  ?Insulin Pen Needle 32G X 6 MM MISC 1 packet by Does not apply route 2 (two) times daily. 09/11/21   Sharion Settler, DO  ?INSULIN SYRINGE .5CC/29G 29G X 1/2" 0.5 ML MISC Use to inject insulin 4 times daily 12/09/21   Brita Romp, NP  ?Lancets (ONETOUCH ULTRASOFT) lancets Use as instructed to monitor glucose 4 times daily 10/22/21   Brita Romp, NP  ?LATUDA 60 MG TABS Take 1 tablet by mouth daily. 09/18/21   [provider]  ?loperamide (IMODIUM A-D) 2 MG tablet Take 1 tablet (2 mg total) by mouth 4 (four) times daily as needed for diarrhea or loose stools. 06/04/21   Gladys Damme, MD  ?LORazepam (ATIVAN) 1 MG tablet Take 0.5-1 mg by mouth daily as needed for anxiety. 09/19/21   [provider]  ?omeprazole (PRILOSEC) 20 MG capsule TAKE 2 CAPSULES (40 MG TOTAL) BY MOUTH AT BEDTIME. ?Patient taking differently: Take 40 mg by mouth daily. 08/13/21   Sharion Settler, DO  ?potassium chloride SA (KLOR-CON M) 20 MEQ tablet Take 20 mEq by mouth daily. 10/31/21   [provider]   ?propranolol (INDERAL) 20 MG tablet SMARTSIG:0.5-1 Tablet(s) By Mouth 2-3 Times Daily PRN 11/29/21   [provider]  ?tiZANidine (ZANAFLEX) 4 MG tablet Take 4 mg by mouth 2 (two) times daily as needed for muscle spasms. 09/02/21   [provider]  ?traMADol (ULTRAM) 50 MG tablet Take 50 mg by mouth 2 (two) times daily. 12/30/21   [provider]  ?traZODone (DESYREL) 100 MG tablet Take 50-100 mg by mouth at bedtime. 12/05/19   [provider]  ?VYVANSE 20 MG capsule Take 20 mg by mouth daily with breakfast. Take with 50 mg for a total of 70 mg 06/28/18   [provider]  ?VYVANSE 50 MG capsule Take 50 mg by mouth See admin instructions. Take with 20 mg for a total of 70  mg in the morning 06/28/18   [provider]  ? ? ?Family History ?Family History  ?Problem Relation Age of Onset  ? COPD Mother   ? Depression Mother   ? Diabetes Mother   ? Hyperlipidemia Mother   ? Asthma Father   ? Arthritis Father   ? Diabetes Father   ? Heart disease Father   ? Hyperlipidemia Father   ? Hypertension Father   ? Hypertension Brother   ? ? ?Social History ?Social History  ? ?Tobacco Use  ? Smoking status: Every Day  ?  Packs/day: 1.50  ?  Years: 21.00  ?  Pack years: 31.50  ?  Types: Cigarettes  ? Smokeless tobacco: Never  ?Vaping Use  ? Vaping Use: Former  ? Devices: Increased nicotine cravings  ?Substance Use Topics  ? Alcohol use: No  ?  Alcohol/week: 0.0 standard drinks  ?  Comment: quit 2013  ? Drug use: Not Currently  ?  Types: Marijuana  ?  Comment: 2009 last use  ? ? ? ?Allergies   ?Metformin and related, Penicillins, and Nsaids ? ? ?Review of Systems ?Review of Systems ?Per HPI ? ?Physical Exam ?Triage Vital Signs ?ED Triage Vitals  ?Enc Vitals Group  ?   BP 01/02/22 1735 127/81  ?   Pulse Rate 01/02/22 1735 (!) 102  ?   Resp 01/02/22 1735 20  ?   Temp 01/02/22 1735 99.2 ?F (37.3 ?C)  ?   Temp Source 01/02/22 1735 Oral  ?   SpO2 01/02/22 1735 94 %  ?   Weight --   ?    Height --   ?   Head Circumference --   ?   Peak Flow --   ?   Pain Score 01/02/22 1732 10  ?   Pain Loc --   ?   Pain Edu? --   ?   Excl. in [place]? --   ? ?No data found. ? ?Updated Vital Signs ?BP 127/81 (BP Location:

## 2022-01-02 NOTE — ED Triage Notes (Signed)
Pt states that his big toe on his right foot has been swollen for couple days  ? ?Denies injury ?

## 2022-01-07 DIAGNOSIS — M1711 Unilateral primary osteoarthritis, right knee: Secondary | ICD-10-CM | POA: Diagnosis not present

## 2022-01-07 DIAGNOSIS — Z87891 Personal history of nicotine dependence: Secondary | ICD-10-CM | POA: Diagnosis not present

## 2022-01-07 DIAGNOSIS — F17210 Nicotine dependence, cigarettes, uncomplicated: Secondary | ICD-10-CM | POA: Diagnosis not present

## 2022-01-07 DIAGNOSIS — Z79899 Other long term (current) drug therapy: Secondary | ICD-10-CM | POA: Diagnosis not present

## 2022-01-07 DIAGNOSIS — E1165 Type 2 diabetes mellitus with hyperglycemia: Secondary | ICD-10-CM | POA: Diagnosis not present

## 2022-01-07 DIAGNOSIS — R1084 Generalized abdominal pain: Secondary | ICD-10-CM | POA: Diagnosis not present

## 2022-01-07 DIAGNOSIS — G8929 Other chronic pain: Secondary | ICD-10-CM | POA: Diagnosis not present

## 2022-01-08 DIAGNOSIS — E1165 Type 2 diabetes mellitus with hyperglycemia: Secondary | ICD-10-CM | POA: Diagnosis not present

## 2022-01-20 ENCOUNTER — Ambulatory Visit: Payer: Medicare Other | Admitting: Nurse Practitioner

## 2022-01-21 ENCOUNTER — Ambulatory Visit: Payer: Medicare Other | Admitting: Nutrition

## 2022-02-02 DIAGNOSIS — S60862A Insect bite (nonvenomous) of left wrist, initial encounter: Secondary | ICD-10-CM | POA: Diagnosis not present

## 2022-02-02 DIAGNOSIS — L089 Local infection of the skin and subcutaneous tissue, unspecified: Secondary | ICD-10-CM | POA: Diagnosis not present

## 2022-02-02 DIAGNOSIS — W57XXXA Bitten or stung by nonvenomous insect and other nonvenomous arthropods, initial encounter: Secondary | ICD-10-CM | POA: Diagnosis not present

## 2022-02-03 DIAGNOSIS — S60862A Insect bite (nonvenomous) of left wrist, initial encounter: Secondary | ICD-10-CM | POA: Diagnosis not present

## 2022-02-03 DIAGNOSIS — K219 Gastro-esophageal reflux disease without esophagitis: Secondary | ICD-10-CM | POA: Diagnosis not present

## 2022-02-03 DIAGNOSIS — E119 Type 2 diabetes mellitus without complications: Secondary | ICD-10-CM | POA: Diagnosis not present

## 2022-02-03 DIAGNOSIS — Z794 Long term (current) use of insulin: Secondary | ICD-10-CM | POA: Diagnosis not present

## 2022-02-03 DIAGNOSIS — W57XXXA Bitten or stung by nonvenomous insect and other nonvenomous arthropods, initial encounter: Secondary | ICD-10-CM | POA: Diagnosis not present

## 2022-02-03 DIAGNOSIS — E785 Hyperlipidemia, unspecified: Secondary | ICD-10-CM | POA: Diagnosis not present

## 2022-02-03 DIAGNOSIS — F17200 Nicotine dependence, unspecified, uncomplicated: Secondary | ICD-10-CM | POA: Diagnosis not present

## 2022-02-03 DIAGNOSIS — Z880 Allergy status to penicillin: Secondary | ICD-10-CM | POA: Diagnosis not present

## 2022-02-06 ENCOUNTER — Other Ambulatory Visit: Payer: Self-pay

## 2022-02-06 ENCOUNTER — Emergency Department (HOSPITAL_COMMUNITY)
Admission: EM | Admit: 2022-02-06 | Discharge: 2022-02-06 | Disposition: A | Discharge status: Home / Self Care | Payer: Medicare Other | Attending: Emergency Medicine | Admitting: Emergency Medicine

## 2022-02-06 ENCOUNTER — Encounter (HOSPITAL_COMMUNITY): Payer: Self-pay | Admitting: *Deleted

## 2022-02-06 DIAGNOSIS — Z7984 Long term (current) use of oral hypoglycemic drugs: Secondary | ICD-10-CM | POA: Diagnosis not present

## 2022-02-06 DIAGNOSIS — R739 Hyperglycemia, unspecified: Secondary | ICD-10-CM | POA: Diagnosis present

## 2022-02-06 DIAGNOSIS — I10 Essential (primary) hypertension: Secondary | ICD-10-CM | POA: Diagnosis not present

## 2022-02-06 DIAGNOSIS — F17200 Nicotine dependence, unspecified, uncomplicated: Secondary | ICD-10-CM | POA: Diagnosis not present

## 2022-02-06 DIAGNOSIS — Z794 Long term (current) use of insulin: Secondary | ICD-10-CM | POA: Insufficient documentation

## 2022-02-06 DIAGNOSIS — E876 Hypokalemia: Secondary | ICD-10-CM | POA: Diagnosis not present

## 2022-02-06 DIAGNOSIS — E1165 Type 2 diabetes mellitus with hyperglycemia: Secondary | ICD-10-CM | POA: Insufficient documentation

## 2022-02-06 LAB — URINALYSIS, ROUTINE W REFLEX MICROSCOPIC
Bacteria, UA: NONE SEEN
Bilirubin Urine: NEGATIVE
Glucose, UA: >=500 mg/dL — AB
Hgb urine dipstick: NEGATIVE
Ketones, ur: NEGATIVE mg/dL
Leukocytes,Ua: NEGATIVE
Nitrite: NEGATIVE
Protein, ur: NEGATIVE mg/dL
Specific Gravity, Urine: 1.033 — ABNORMAL HIGH (ref 1.005–1.030)
pH: 6 (ref 5.0–8.0)

## 2022-02-06 LAB — CBC
HCT: 45.6 % (ref 39.0–52.0)
Hemoglobin: 15.3 g/dL (ref 13.0–17.0)
MCH: 27.5 pg (ref 26.0–34.0)
MCHC: 33.6 g/dL (ref 30.0–36.0)
MCV: 82 fL (ref 80.0–100.0)
Platelets: 202 10*3/uL (ref 150–400)
RBC: 5.56 MIL/uL (ref 4.22–5.81)
RDW: 13.5 % (ref 11.5–15.5)
WBC: 10.5 10*3/uL (ref 4.0–10.5)
nRBC: 0 % (ref 0.0–0.2)

## 2022-02-06 LAB — COMPREHENSIVE METABOLIC PANEL
ALT: 41 U/L (ref 0–44)
AST: 28 U/L (ref 15–41)
Albumin: 4.1 g/dL (ref 3.5–5.0)
Alkaline Phosphatase: 89 U/L (ref 38–126)
Anion gap: 10 (ref 5–15)
BUN: 7 mg/dL (ref 6–20)
CO2: 26 mmol/L (ref 22–32)
Calcium: 9.3 mg/dL (ref 8.9–10.3)
Chloride: 100 mmol/L (ref 98–111)
Creatinine, Ser: 0.73 mg/dL (ref 0.61–1.24)
GFR, Estimated: >60 mL/min (ref >60)
Glucose, Bld: 350 mg/dL — ABNORMAL HIGH (ref 70–99)
Potassium: 3.1 mmol/L — ABNORMAL LOW (ref 3.5–5.1)
Sodium: 136 mmol/L (ref 135–145)
Total Bilirubin: 0.4 mg/dL (ref 0.3–1.2)
Total Protein: 7.9 g/dL (ref 6.5–8.1)

## 2022-02-06 LAB — LIPASE, BLOOD: Lipase: 39 U/L (ref 11–51)

## 2022-02-06 LAB — CBG MONITORING, ED: Glucose-Capillary: 250 mg/dL — ABNORMAL HIGH (ref 70–99)

## 2022-02-06 MED ORDER — POTASSIUM CHLORIDE 20 MEQ PO PACK
40.0000 meq | PACK | Freq: Once | ORAL | Status: AC
  Administered 2022-02-06: 40 meq via ORAL
  Filled 2022-02-06: qty 2

## 2022-02-06 MED ORDER — SODIUM CHLORIDE 0.9 % IV BOLUS
1000.0000 mL | Freq: Once | INTRAVENOUS | Status: AC
  Administered 2022-02-06: 1000 mL via INTRAVENOUS

## 2022-02-06 MED ORDER — ACETAMINOPHEN 325 MG PO TABS
650.0000 mg | ORAL_TABLET | Freq: Once | ORAL | Status: AC
  Administered 2022-02-06: 650 mg via ORAL
  Filled 2022-02-06: qty 2

## 2022-02-06 NOTE — ED Triage Notes (Signed)
Pt states he was at work and sent home due to BP of 160/105 and his cbg was almost 400; pt c/o abdominal pain; pt c/o right side pain ?

## 2022-02-06 NOTE — Progress Notes (Signed)
Inpatient Diabetes Program Recommendations ? ?AACE/ADA: New Consensus Statement on Inpatient Glycemic Control ?Target Ranges:  Prepandial:   less than 140 mg/dL ?     Peak postprandial:   less than 180 mg/dL (1-2 hours) ?     Critically ill patients:  140 - 180 mg/dL  ? ? Latest Reference Range & Units 02/06/22 11:42  ?Glucose 70 - 99 mg/dL 350 (H)  ? ? 09/18/21 00:00  ?Hemoglobin A1C 13.9 (E)  ? ?Review of Glycemic Control ? ?Diabetes history: DM2 ?Outpatient Diabetes medications: Lantus 40 units QHS, Humalog 5-11 units TID with meals, Glipizide XL 5 mg QAM ?Current orders for Inpatient glycemic control: None in ED ? ?Inpatient Diabetes Program Recommendations:   ? ?HbgA1C:  A1C 13.5% on 12/10/21 (noted in note on 12/10/21 by Rubin Payor). ? ?NOTE: Patient is currently in ED from work due to hypertension and hyperglycemia. In reviewing chart, noted patient sees Abbe Amsterdam, NP [facility]) and was last seen 12/09/21. Per office note on 12/09/21, "He presents today, accompanied by his wife, with his logs showing readings that are suspicious for being fabricated.  He has been to the hospital several time since last visit for various complaints including pain and hyperglycemia.  He says the ED physician told him to stop the Humalog as it may have been a bad batch since his glucose went higher after taking it.  He is asking about taking pills for his diabetes instead of insulin and also is inquiring about a CGM device.  He continues to drink sodas, energy drinks, and SF gatorade which is preventing him from gaining optimal control of his diabetes.He is advised to continue his Lantus at 40 units SQ nightly.  I discussed and restarted Humalog 5-11 units TID with meals if glucose is above 90 and he is eating (Specific instructions on how to titrate insulin dosage based on glucose readings given to patient in writing).  I also started him on Glipizide 5 mg XL daily with breakfast.  He asked to switch to vials,  thinks he could self inject better that way." ? ?Thanks, ?Barnie Alderman, RN, MSN, CDE ?Diabetes Coordinator ?Inpatient Diabetes Program ?[phone] (Team Pager from 8am to 5pm) ?  ? ?

## 2022-02-06 NOTE — ED Provider Notes (Signed)
?[facility] ?Provider Note ? ? ?CSN: [id number] ?Arrival date & time: 02/06/22  1045 ? ?  ? ?History ?Chief Complaint  ?Patient presents with  ? Hypertension  ? ? ?[name] is a 41 y.o. male with history of hyperlipidemia, diabetes on insulin and glipizide, diverticulitis rupture status post colon resection who presents to the emergency department today for elevated blood pressures and blood sugar from his work.  Patient states he was also sent home on Monday for the same.  He does not check his blood sugars regularly nor does he take any medication or check his blood pressure.  Patient is complaining of abdominal pain which she states is chronic and has been having the same abdominal pain for over a year.  Patient states his blood sugars were in the 400s at work.  He has been taking his medications as prescribed.  Last followed up with his primary care doctor 2 months ago and his blood sugars were normal at that time.  He does endorse some fatigue and polyuria.  No chest pain or shortness of breath. ? ? ?Hypertension ? ? ?  ? ?Home Medications ?Prior to Admission medications   ?Medication Sig Start Date End Date Taking? Authorizing Provider  ?atorvastatin (LIPITOR) 40 MG tablet Take 1 tablet (40 mg total) by mouth daily. 08/22/21  Yes Paige, Weldon Picking, DO  ?FLUoxetine (PROZAC) 40 MG capsule Take 40 mg by mouth at bedtime. 12/03/21  Yes [provider]  ?glipiZIDE (GLUCOTROL XL) 5 MG 24 hr tablet Take 1 tablet (5 mg total) by mouth daily with breakfast. 12/09/21  Yes Brita Romp, NP  ?Ibuprofen-diphenhydrAMINE Cit (ADVIL PM PO) Take 1 tablet by mouth at bedtime as needed (sleep).   Yes [provider]  ?insulin glargine (LANTUS) 100 UNIT/ML injection Inject 0.4 mLs (40 Units total) into the skin at bedtime. 12/09/21  Yes Brita Romp, NP  ?insulin lispro (HUMALOG) 100 UNIT/ML injection Inject 0.05-0.11 mLs (5-11 Units total) into the skin 3 (three) times daily with  meals. 12/09/21  Yes Brita Romp, NP  ?LATUDA 60 MG TABS Take 1 tablet by mouth daily. 09/18/21  Yes [provider]  ?LORazepam (ATIVAN) 1 MG tablet Take 0.5-1 mg by mouth daily as needed for anxiety. 09/19/21  Yes [provider]  ?omeprazole (PRILOSEC) 20 MG capsule TAKE 2 CAPSULES (40 MG TOTAL) BY MOUTH AT BEDTIME. ?Patient taking differently: Take 40 mg by mouth daily. 08/13/21  Yes Sharion Settler, DO  ?propranolol (INDERAL) 20 MG tablet SMARTSIG:0.5-1 Tablet(s) By Mouth 2-3 Times Daily PRN 11/29/21  Yes [provider]  ?traMADol (ULTRAM) 50 MG tablet Take 50 mg by mouth 2 (two) times daily. 12/30/21  Yes [provider]  ?traZODone (DESYREL) 100 MG tablet Take 50-100 mg by mouth at bedtime. 12/05/19  Yes [provider]  ?VYVANSE 20 MG capsule Take 20 mg by mouth daily with breakfast. Take with 50 mg for a total of 70 mg 06/28/18  Yes [provider]  ?VYVANSE 50 MG capsule Take 50 mg by mouth See admin instructions. Take with 20 mg for a total of 70 mg in the morning 06/28/18  Yes [provider]  ?Blood Glucose Monitoring Suppl (ONE TOUCH ULTRA 2) w/Device KIT Measure blood sugar daily and with meals for insulin 10/22/21   Brita Romp, NP  ?cloNIDine HCl (KAPVAY) 0.1 MG TB12 ER tablet Take 0.1 mg by mouth daily. ?Patient not taking: Reported on 02/06/2022 09/18/21  [provider]  ?glucose blood (ONETOUCH ULTRA) test strip Use as instructed to monitor glucose 4 times daily 10/22/21   Brita Romp, NP  ?Insulin Pen Needle 32G X 6 MM MISC 1 packet by Does not apply route 2 (two) times daily. 09/11/21   Sharion Settler, DO  ?INSULIN SYRINGE .5CC/29G 29G X 1/2" 0.5 ML MISC Use to inject insulin 4 times daily 12/09/21   Brita Romp, NP  ?Lancets (ONETOUCH ULTRASOFT) lancets Use as instructed to monitor glucose 4 times daily 10/22/21   Brita Romp, NP  ?loperamide (IMODIUM A-D) 2 MG tablet Take 1 tablet (2 mg  total) by mouth 4 (four) times daily as needed for diarrhea or loose stools. 06/04/21   Gladys Damme, MD  ?potassium chloride SA (KLOR-CON M) 20 MEQ tablet Take 20 mEq by mouth daily. ?Patient not taking: Reported on 02/06/2022 10/31/21   [provider]  ?   ? ?Allergies    ?Metformin and related, Penicillins, and Nsaids   ? ?Review of Systems   ?Review of Systems  ?All other systems reviewed and are negative. ? ?Physical Exam ?Updated Vital Signs ?BP 116/66   Pulse 91   Temp 97.8 ?F (36.6 ?C) (Oral)   Resp 18   Ht _0  (1.702 m)   Wt 102.5 kg   SpO2 97%   BMI 35.40 kg/m?  ?Physical Exam ?Vitals and nursing note reviewed.  ?Constitutional:   ?   General: He is not in acute distress. ?   Appearance: Normal appearance.  ?HENT:  ?   Head: Normocephalic and atraumatic.  ?Eyes:  ?   General:     ?   Right eye: No discharge.     ?   Left eye: No discharge.  ?Cardiovascular:  ?   Comments: Regular rate and rhythm.  S1/S2 are distinct without any evidence of murmur, rubs, or gallops.  Radial pulses are 2+ bilaterally.  Dorsalis pedis pulses are 2+ bilaterally.  No evidence of pedal edema. ?Pulmonary:  ?   Comments: Clear to auscultation bilaterally.  Normal effort.  No respiratory distress.  No evidence of wheezes, rales, or rhonchi heard throughout. ?Abdominal:  ?   General: Abdomen is flat. Bowel sounds are normal. There is no distension.  ?   Tenderness: There is no abdominal tenderness. There is no guarding or rebound.  ?   Comments: Well-healed surgical scars over the abdomen.  ?Musculoskeletal:     ?   General: Normal range of motion.  ?   Cervical back: Neck supple.  ?Skin: ?   General: Skin is warm and dry.  ?   Findings: No rash.  ?Neurological:  ?   General: No focal deficit present.  ?   Mental Status: He is alert.  ?Psychiatric:     ?   Mood and Affect: Mood normal.     ?   Behavior: Behavior normal.  ? ? ?ED Results / Procedures / Treatments   ?Labs ?(all labs ordered are listed, but only  abnormal results are displayed) ?Labs Reviewed  ?COMPREHENSIVE METABOLIC PANEL - Abnormal; Notable for the following components:  ?    Result Value  ? Potassium 3.1 (*)   ? Glucose, Bld 350 (*)   ? All other components within normal limits  ?URINALYSIS, ROUTINE W REFLEX MICROSCOPIC - Abnormal; Notable for the following components:  ? Specific Gravity, Urine 1.033 (*)   ? Glucose, UA >=500 (*)   ? All other components within normal  limits  ?CBG MONITORING, ED - Abnormal; Notable for the following components:  ? Glucose-Capillary 250 (*)   ? All other components within normal limits  ?LIPASE, BLOOD  ?CBC  ? ? ?EKG ?None ? ?Radiology ?No results found. ? ?Procedures ?Procedures  ? ? ?Medications Ordered in ED ?Medications  ?sodium chloride 0.9 % bolus 1,000 mL (1,000 mLs Intravenous Bolus 02/06/22 1316)  ?potassium chloride (KLOR-CON) packet 40 mEq (40 mEq Oral Given 02/06/22 1423)  ?acetaminophen (TYLENOL) tablet 650 mg (650 mg Oral Given 02/06/22 1421)  ? ? ?ED Course/ Medical Decision Making/ A&P ?  ?                        ?Medical Decision Making ?Amount and/or Complexity of Data Reviewed ?Labs: ordered. ? ?Risk ?OTC drugs. ?Prescription drug management. ? ? ?This patient presents to the ED for concern of hypertension and elevated blood sugars, this involves an extensive number of treatment options, and is a complaint that carries with it a high risk of complications and morbidity.  The differential diagnosis includes asymptomatic hypertension, asymptomatic hyperglycemia.  Have a low suspicion for DKA or HHS at this time. ? ? ?Co morbidities that complicate the patient evaluation ? ?Past Medical History:  ?Diagnosis Date  ? Allergy   ? Anxiety   ? 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 Bipolar disorder ([place])   ? On disability  ? Depression   ? Diabetes mellitus without complication ([place])   ? per patient : under control with diet, does not monitor cbg at home   ? GERD (gastroesophageal reflux disease)   ?  Headache(784.0)   ? daily headaches  ? Hyperlipidemia   ? Neuromuscular disorder ([place])   ? Beckers muscular dystrophy  ? Obesity   ? PTSD (post-traumatic stress disorder)   ? Sleep apnea   ? Does not tolerate CPAP  ? ? ? ?Additi

## 2022-02-06 NOTE — Discharge Instructions (Signed)
Please begin checking your sugars and blood pressure regularly.  I would like for you to follow-up with your primary care doctor for possible medication adjustments.  Return to the emergency department for any worsening symptoms you might have. ?

## 2022-02-07 DIAGNOSIS — M1711 Unilateral primary osteoarthritis, right knee: Secondary | ICD-10-CM | POA: Diagnosis not present

## 2022-02-07 DIAGNOSIS — Z87891 Personal history of nicotine dependence: Secondary | ICD-10-CM | POA: Diagnosis not present

## 2022-02-07 DIAGNOSIS — R030 Elevated blood-pressure reading, without diagnosis of hypertension: Secondary | ICD-10-CM | POA: Diagnosis not present

## 2022-02-07 DIAGNOSIS — Z794 Long term (current) use of insulin: Secondary | ICD-10-CM | POA: Diagnosis not present

## 2022-02-07 DIAGNOSIS — R1084 Generalized abdominal pain: Secondary | ICD-10-CM | POA: Diagnosis not present

## 2022-02-07 DIAGNOSIS — R0602 Shortness of breath: Secondary | ICD-10-CM | POA: Diagnosis not present

## 2022-02-07 DIAGNOSIS — F17210 Nicotine dependence, cigarettes, uncomplicated: Secondary | ICD-10-CM | POA: Diagnosis not present

## 2022-02-07 DIAGNOSIS — Z79899 Other long term (current) drug therapy: Secondary | ICD-10-CM | POA: Diagnosis not present

## 2022-02-07 DIAGNOSIS — G8929 Other chronic pain: Secondary | ICD-10-CM | POA: Diagnosis not present

## 2022-02-07 DIAGNOSIS — E119 Type 2 diabetes mellitus without complications: Secondary | ICD-10-CM | POA: Diagnosis not present

## 2022-02-14 DIAGNOSIS — Z79899 Other long term (current) drug therapy: Secondary | ICD-10-CM | POA: Diagnosis not present

## 2022-02-15 DIAGNOSIS — R109 Unspecified abdominal pain: Secondary | ICD-10-CM | POA: Diagnosis not present

## 2022-02-15 DIAGNOSIS — Z79899 Other long term (current) drug therapy: Secondary | ICD-10-CM | POA: Diagnosis not present

## 2022-02-15 DIAGNOSIS — G8929 Other chronic pain: Secondary | ICD-10-CM | POA: Diagnosis not present

## 2022-02-15 DIAGNOSIS — F32A Depression, unspecified: Secondary | ICD-10-CM | POA: Diagnosis not present

## 2022-02-18 DIAGNOSIS — Z79899 Other long term (current) drug therapy: Secondary | ICD-10-CM | POA: Diagnosis not present

## 2022-02-24 ENCOUNTER — Ambulatory Visit: Payer: Medicare Other | Admitting: Nutrition

## 2022-03-15 DIAGNOSIS — G8929 Other chronic pain: Secondary | ICD-10-CM | POA: Diagnosis not present

## 2022-03-15 DIAGNOSIS — R109 Unspecified abdominal pain: Secondary | ICD-10-CM | POA: Diagnosis not present

## 2022-03-15 DIAGNOSIS — F32A Depression, unspecified: Secondary | ICD-10-CM | POA: Diagnosis not present

## 2022-03-15 DIAGNOSIS — Z79899 Other long term (current) drug therapy: Secondary | ICD-10-CM | POA: Diagnosis not present

## 2022-03-15 DIAGNOSIS — F17210 Nicotine dependence, cigarettes, uncomplicated: Secondary | ICD-10-CM | POA: Diagnosis not present

## 2022-03-19 DIAGNOSIS — Z79899 Other long term (current) drug therapy: Secondary | ICD-10-CM | POA: Diagnosis not present

## 2022-03-25 DIAGNOSIS — K219 Gastro-esophageal reflux disease without esophagitis: Secondary | ICD-10-CM | POA: Diagnosis not present

## 2022-03-25 DIAGNOSIS — E1140 Type 2 diabetes mellitus with diabetic neuropathy, unspecified: Secondary | ICD-10-CM | POA: Diagnosis not present

## 2022-03-25 DIAGNOSIS — E559 Vitamin D deficiency, unspecified: Secondary | ICD-10-CM | POA: Diagnosis not present

## 2022-03-25 DIAGNOSIS — E1165 Type 2 diabetes mellitus with hyperglycemia: Secondary | ICD-10-CM | POA: Diagnosis not present

## 2022-03-25 DIAGNOSIS — Z7182 Exercise counseling: Secondary | ICD-10-CM | POA: Diagnosis not present

## 2022-03-25 DIAGNOSIS — E785 Hyperlipidemia, unspecified: Secondary | ICD-10-CM | POA: Diagnosis not present

## 2022-03-25 DIAGNOSIS — F17200 Nicotine dependence, unspecified, uncomplicated: Secondary | ICD-10-CM | POA: Diagnosis not present

## 2022-03-25 DIAGNOSIS — Z713 Dietary counseling and surveillance: Secondary | ICD-10-CM | POA: Diagnosis not present

## 2022-03-25 DIAGNOSIS — J302 Other seasonal allergic rhinitis: Secondary | ICD-10-CM | POA: Diagnosis not present

## 2022-04-15 DIAGNOSIS — Z5181 Encounter for therapeutic drug level monitoring: Secondary | ICD-10-CM | POA: Diagnosis not present

## 2022-04-15 DIAGNOSIS — Z79899 Other long term (current) drug therapy: Secondary | ICD-10-CM | POA: Diagnosis not present

## 2022-04-16 DIAGNOSIS — Z7984 Long term (current) use of oral hypoglycemic drugs: Secondary | ICD-10-CM | POA: Diagnosis not present

## 2022-04-16 DIAGNOSIS — E119 Type 2 diabetes mellitus without complications: Secondary | ICD-10-CM | POA: Diagnosis not present

## 2022-04-16 DIAGNOSIS — Z794 Long term (current) use of insulin: Secondary | ICD-10-CM | POA: Diagnosis not present

## 2022-04-17 ENCOUNTER — Encounter: Payer: Self-pay | Admitting: Nurse Practitioner

## 2022-04-17 LAB — HM DIABETES EYE EXAM

## 2022-04-30 ENCOUNTER — Encounter: Payer: Medicare Other | Attending: Nurse Practitioner | Admitting: Nutrition

## 2022-04-30 ENCOUNTER — Ambulatory Visit: Payer: Medicare Other | Admitting: Nutrition

## 2022-04-30 DIAGNOSIS — Z713 Dietary counseling and surveillance: Secondary | ICD-10-CM | POA: Insufficient documentation

## 2022-04-30 DIAGNOSIS — G8929 Other chronic pain: Secondary | ICD-10-CM | POA: Diagnosis not present

## 2022-04-30 DIAGNOSIS — R1032 Left lower quadrant pain: Secondary | ICD-10-CM | POA: Diagnosis not present

## 2022-04-30 DIAGNOSIS — Z886 Allergy status to analgesic agent status: Secondary | ICD-10-CM | POA: Diagnosis not present

## 2022-04-30 DIAGNOSIS — Z888 Allergy status to other drugs, medicaments and biological substances status: Secondary | ICD-10-CM | POA: Diagnosis not present

## 2022-04-30 DIAGNOSIS — E7800 Pure hypercholesterolemia, unspecified: Secondary | ICD-10-CM

## 2022-04-30 DIAGNOSIS — Z880 Allergy status to penicillin: Secondary | ICD-10-CM | POA: Diagnosis not present

## 2022-04-30 DIAGNOSIS — Z794 Long term (current) use of insulin: Secondary | ICD-10-CM | POA: Diagnosis not present

## 2022-04-30 DIAGNOSIS — K219 Gastro-esophageal reflux disease without esophagitis: Secondary | ICD-10-CM | POA: Diagnosis not present

## 2022-04-30 DIAGNOSIS — R109 Unspecified abdominal pain: Secondary | ICD-10-CM | POA: Diagnosis not present

## 2022-04-30 DIAGNOSIS — E1165 Type 2 diabetes mellitus with hyperglycemia: Secondary | ICD-10-CM | POA: Insufficient documentation

## 2022-04-30 DIAGNOSIS — E785 Hyperlipidemia, unspecified: Secondary | ICD-10-CM | POA: Diagnosis not present

## 2022-04-30 DIAGNOSIS — M5442 Lumbago with sciatica, left side: Secondary | ICD-10-CM | POA: Diagnosis not present

## 2022-04-30 DIAGNOSIS — E119 Type 2 diabetes mellitus without complications: Secondary | ICD-10-CM | POA: Diagnosis not present

## 2022-04-30 DIAGNOSIS — R1084 Generalized abdominal pain: Secondary | ICD-10-CM | POA: Diagnosis not present

## 2022-04-30 NOTE — Progress Notes (Signed)
Medical Nutrition Therapy  Appointment Start time:  4268  Appointment End time:  84  Primary concerns today: Type 2 DM, Hyperglycemia  Referral diagnosis: E11.8 Preferred learning style: Hands on, visual, auditory  Learning readiness: Contemplating    NUTRITION ASSESSMENT  41 year old wmale in the hospital in ED([place])  due to hyperglycemia. He notes he was dehydrated over the weekend with a severe headache and his BS were greater than 300's. A1C was13.9% and he notes the last time he got it checked, it was down to 9%. He goes to [facility] in [place].  He notes he hasn't been eating consistently, slips meals, doen't eat on a schedule and mostly eats fast foods or whatever he fix  at his house.   History of hyperlipidemia. Current diet of processed foods and uncontrolled blood sugars increase risk of a cardiovascular event.  He notes he usually takes his insulin at night but sometimes doesn't take his meal time insulin. Doesn't understand how to use the sliding scale insulin regimen that was given to him by Rayetta Pigg, FNP at [facility].  Re educated on a slidng scale pattern for his meal time insulin.  Currently on disability. Goes to the pain clinic due to chronic pain.  Admits he doesn't like a lot of healthy foods. Eats mostly processed foods. Drinks sodas, smokes and chooses not to work on quitting. Dips snuff. Doesn't drink any water. Doesn't have any teeth. History of addiction.  Current eating habits  are poor and his noncompliance with medications contribute to hyperglycemia. He is willing to work on his diet and avoiding sodas and sweetened beverages to improve his blood sugars.  He missed appt with [facility] End[facility]ology. Encouraged him to call and r/s missed appointment.  Anthropometrics  Wt Readings from Last 3 Encounters:  02/06/22 226 lb (102.5 kg)  12/09/21 228 lb (103.4 kg)  11/05/21 227 lb 12.8 oz (103.3 kg)   Ht Readings from Last 3 Encounters:   02/06/22 $RemoveB'5\' 7"'XSaLhvIH$  (1.702 m)  12/09/21 $RemoveB'5\' 7"'fhSLooUN$  (1.702 m)  11/05/21 $RemoveB'5\' 7"'SzkfdCIg$  (1.702 m)   There is no height or weight on file to calculate BMI. $RemoveBefore'@BMIFA'AdmRTglOyoqJL$ @ Facility age limit for growth %iles is 20 years. Facility age limit for growth %iles is 20 years.    Clinical Medical Hx: see chart Medications: Latnus 40 units at night and 5+ units meals using the sliding scale, Labs:  Lab Results  Component Value Date   HGBA1C 13.9 09/18/2021      Latest Ref Rng & Units 02/06/2022   11:42 AM 11/19/2021    9:29 AM 09/18/2021   12:00 AM  CMP  Glucose 70 - 99 mg/dL 350  489    BUN 6 - 20 mg/dL $Remove'7  10  9      'wTztLfo$ Creatinine 0.61 - 1.24 mg/dL 0.73  0.90  0.9      Sodium 135 - 145 mmol/L 136  131    Potassium 3.5 - 5.1 mmol/L 3.1  3.7    Chloride 98 - 111 mmol/L 100  96  96      CO2 22 - 32 mmol/L 26  24    Calcium 8.9 - 10.3 mg/dL 9.3  9.4    Total Protein 6.5 - 8.1 g/dL 7.9     Total Bilirubin 0.3 - 1.2 mg/dL 0.4     Alkaline Phos 38 - 126 U/L 89     AST 15 - 41 U/L 28     ALT 0 - 44 U/L  41        This result is from an external source.   Lipid Panel     Component Value Date/Time   CHOL 217 (H) 11/23/2019 1208   TRIG 433 (A) 09/18/2021 0000   HDL 33 (L) 11/23/2019 1208   CHOLHDL 6.6 (H) 11/23/2019 1208   CHOLHDL 11.7 (H) 12/11/2015 1423   VLDL 40 (H) 12/11/2015 1423   LDLCALC 91 09/18/2021 0000   LDLCALC 124 (H) 11/23/2019 1208   LDLDIRECT 63 07/23/2012 1552   LABVLDL 60 (H) 11/23/2019 1208    Notable Signs/Symptoms: increased thirst, frequent urination, weight loss  Lifestyle & Dietary Hx Lives with his wife. Eats out often. On disability.   Estimated daily fluid intake: No water; mainly sodas, juices or tea oz Supplements:  Sleep: varies Stress / self-care:  Current average weekly physical activity: walks  24-Hr Dietary Recall Doesn't eat 3 meals per day. Eats whenever he feels like it. Doesn't take insulin with meals as prescribed.  Estimated Energy Needs Calories:  1800 Carbohydrate: 200g Protein: 135g Fat: 50g   NUTRITION DIAGNOSIS  NB-1.1 Food and nutrition-related knowledge deficit As related to Diabetes Type 2.  As evidenced by A1C 12.8%.   NUTRITION INTERVENTION  Nutrition education (E-1) on the following topics:  Nutrition and Diabetes education provided on My Plate, CHO counting, meal planning, portion sizes, timing of meals, avoiding snacks between meals unless having a low blood sugar, target ranges for A1C and blood sugars, signs/symptoms and treatment of hyper/hypoglycemia, monitoring blood sugars, taking medications as prescribed, benefits of exercising 30 minutes per day and prevention of complications of DM. Cutting out processed foods  Handouts Provided Include  Will mail information.  Learning Style & Readiness for Change Teaching method utilized: Visual & Auditory  Demonstrated degree of understanding via: Teach Back  Barriers to learning/adherence to lifestyle change: compliance and willingness to change lifestyle.  Goals Established by Pt  Eat three meals per day  Breakfast by 8 am, Lunch 12-2 and Dinner 5-7 pm.  Don't eat past 7 pm. Use sliding scale as discussed for meal time insulin and take as prescribed. Don't skip meals Cut out sodas and drink only water Increase fresh fruits and vegetables daily. Call [facility] Endocrinology and reschedule missed appointment.   MONITORING & EVALUATION Dietary intake, weekly physical activity, and blood sugars in 1 month.  Next Steps  Patient is to work on meal planning and eating meals on time.Marland Kitchen

## 2022-05-01 NOTE — Patient Instructions (Addendum)
Goals  Eat three meals per day  Breakfast by 8 am, Lunch 12-2 and Dinner 5-7 pm.  Don't eat past 7 pm. Use sliding scale as discussed for meal time insulin and take as prescribed. Don't skip meals Cut out sodas and drink only water Increase fresh fruits and vegetables daily. Call [facility] Endocrinology and reschedule missed appointment.

## 2022-05-05 ENCOUNTER — Encounter: Payer: Self-pay | Admitting: Nutrition

## 2022-05-20 ENCOUNTER — Ambulatory Visit: Payer: Medicare Other | Admitting: Nutrition

## 2022-05-20 ENCOUNTER — Telehealth: Payer: Self-pay | Admitting: Nutrition

## 2022-05-20 NOTE — Telephone Encounter (Signed)
Unable to leave message on his phone. VM on his wife phone is full.

## 2022-05-21 DIAGNOSIS — F17210 Nicotine dependence, cigarettes, uncomplicated: Secondary | ICD-10-CM | POA: Diagnosis not present

## 2022-05-21 DIAGNOSIS — R109 Unspecified abdominal pain: Secondary | ICD-10-CM | POA: Diagnosis not present

## 2022-05-21 DIAGNOSIS — G8929 Other chronic pain: Secondary | ICD-10-CM | POA: Diagnosis not present

## 2022-05-21 DIAGNOSIS — Z79899 Other long term (current) drug therapy: Secondary | ICD-10-CM | POA: Diagnosis not present

## 2022-05-21 DIAGNOSIS — F32A Depression, unspecified: Secondary | ICD-10-CM | POA: Diagnosis not present

## 2022-05-21 DIAGNOSIS — M25561 Pain in right knee: Secondary | ICD-10-CM | POA: Diagnosis not present

## 2022-05-23 ENCOUNTER — Other Ambulatory Visit: Payer: Self-pay | Admitting: Family Medicine

## 2022-05-23 DIAGNOSIS — Z79899 Other long term (current) drug therapy: Secondary | ICD-10-CM | POA: Diagnosis not present

## 2022-05-27 DIAGNOSIS — R262 Difficulty in walking, not elsewhere classified: Secondary | ICD-10-CM | POA: Diagnosis not present

## 2022-05-27 DIAGNOSIS — G603 Idiopathic progressive neuropathy: Secondary | ICD-10-CM | POA: Diagnosis not present

## 2022-05-27 DIAGNOSIS — Z79899 Other long term (current) drug therapy: Secondary | ICD-10-CM | POA: Diagnosis not present

## 2022-05-31 DIAGNOSIS — E785 Hyperlipidemia, unspecified: Secondary | ICD-10-CM | POA: Diagnosis not present

## 2022-05-31 DIAGNOSIS — Z7984 Long term (current) use of oral hypoglycemic drugs: Secondary | ICD-10-CM | POA: Diagnosis not present

## 2022-05-31 DIAGNOSIS — M545 Low back pain, unspecified: Secondary | ICD-10-CM | POA: Diagnosis not present

## 2022-05-31 DIAGNOSIS — G8929 Other chronic pain: Secondary | ICD-10-CM | POA: Diagnosis not present

## 2022-05-31 DIAGNOSIS — E119 Type 2 diabetes mellitus without complications: Secondary | ICD-10-CM | POA: Diagnosis not present

## 2022-05-31 DIAGNOSIS — M419 Scoliosis, unspecified: Secondary | ICD-10-CM | POA: Diagnosis not present

## 2022-05-31 DIAGNOSIS — Z794 Long term (current) use of insulin: Secondary | ICD-10-CM | POA: Diagnosis not present

## 2022-06-12 DIAGNOSIS — G603 Idiopathic progressive neuropathy: Secondary | ICD-10-CM | POA: Diagnosis not present

## 2022-06-12 DIAGNOSIS — R262 Difficulty in walking, not elsewhere classified: Secondary | ICD-10-CM | POA: Diagnosis not present

## 2022-06-13 DIAGNOSIS — Z888 Allergy status to other drugs, medicaments and biological substances status: Secondary | ICD-10-CM | POA: Diagnosis not present

## 2022-06-13 DIAGNOSIS — Z794 Long term (current) use of insulin: Secondary | ICD-10-CM | POA: Diagnosis not present

## 2022-06-13 DIAGNOSIS — Z7984 Long term (current) use of oral hypoglycemic drugs: Secondary | ICD-10-CM | POA: Diagnosis not present

## 2022-06-13 DIAGNOSIS — R079 Chest pain, unspecified: Secondary | ICD-10-CM | POA: Diagnosis not present

## 2022-06-13 DIAGNOSIS — Z791 Long term (current) use of non-steroidal anti-inflammatories (NSAID): Secondary | ICD-10-CM | POA: Diagnosis not present

## 2022-06-13 DIAGNOSIS — Z886 Allergy status to analgesic agent status: Secondary | ICD-10-CM | POA: Diagnosis not present

## 2022-06-13 DIAGNOSIS — Z880 Allergy status to penicillin: Secondary | ICD-10-CM | POA: Diagnosis not present

## 2022-06-13 DIAGNOSIS — Z7985 Long-term (current) use of injectable non-insulin antidiabetic drugs: Secondary | ICD-10-CM | POA: Diagnosis not present

## 2022-06-13 DIAGNOSIS — E119 Type 2 diabetes mellitus without complications: Secondary | ICD-10-CM | POA: Diagnosis not present

## 2022-06-13 DIAGNOSIS — Z79899 Other long term (current) drug therapy: Secondary | ICD-10-CM | POA: Diagnosis not present

## 2022-06-13 DIAGNOSIS — K219 Gastro-esophageal reflux disease without esophagitis: Secondary | ICD-10-CM | POA: Diagnosis not present

## 2022-06-16 DIAGNOSIS — R262 Difficulty in walking, not elsewhere classified: Secondary | ICD-10-CM | POA: Diagnosis not present

## 2022-06-16 DIAGNOSIS — G603 Idiopathic progressive neuropathy: Secondary | ICD-10-CM | POA: Diagnosis not present

## 2022-06-20 DIAGNOSIS — G603 Idiopathic progressive neuropathy: Secondary | ICD-10-CM | POA: Diagnosis not present

## 2022-06-20 DIAGNOSIS — R262 Difficulty in walking, not elsewhere classified: Secondary | ICD-10-CM | POA: Diagnosis not present

## 2022-06-23 ENCOUNTER — Other Ambulatory Visit: Payer: Self-pay

## 2022-06-23 ENCOUNTER — Emergency Department (HOSPITAL_COMMUNITY)
Admission: EM | Admit: 2022-06-23 | Discharge: 2022-06-23 | Disposition: A | Discharge status: Home / Self Care | Payer: Medicare Other | Attending: Emergency Medicine | Admitting: Emergency Medicine

## 2022-06-23 ENCOUNTER — Encounter (HOSPITAL_COMMUNITY): Payer: Self-pay | Admitting: Emergency Medicine

## 2022-06-23 ENCOUNTER — Emergency Department (HOSPITAL_COMMUNITY): Payer: Medicare Other

## 2022-06-23 DIAGNOSIS — R160 Hepatomegaly, not elsewhere classified: Secondary | ICD-10-CM | POA: Diagnosis not present

## 2022-06-23 DIAGNOSIS — F17210 Nicotine dependence, cigarettes, uncomplicated: Secondary | ICD-10-CM | POA: Insufficient documentation

## 2022-06-23 DIAGNOSIS — R1031 Right lower quadrant pain: Secondary | ICD-10-CM | POA: Diagnosis not present

## 2022-06-23 DIAGNOSIS — Z794 Long term (current) use of insulin: Secondary | ICD-10-CM | POA: Insufficient documentation

## 2022-06-23 DIAGNOSIS — Z7984 Long term (current) use of oral hypoglycemic drugs: Secondary | ICD-10-CM | POA: Insufficient documentation

## 2022-06-23 DIAGNOSIS — E119 Type 2 diabetes mellitus without complications: Secondary | ICD-10-CM | POA: Diagnosis not present

## 2022-06-23 DIAGNOSIS — R109 Unspecified abdominal pain: Secondary | ICD-10-CM | POA: Diagnosis not present

## 2022-06-23 LAB — CBC WITH DIFFERENTIAL/PLATELET
Abs Immature Granulocytes: 0.02 10*3/uL (ref 0.00–0.07)
Basophils Absolute: 0.1 10*3/uL (ref 0.0–0.1)
Basophils Relative: 1 %
Eosinophils Absolute: 0.2 10*3/uL (ref 0.0–0.5)
Eosinophils Relative: 2 %
HCT: 45 % (ref 39.0–52.0)
Hemoglobin: 15.8 g/dL (ref 13.0–17.0)
Immature Granulocytes: 0 %
Lymphocytes Relative: 35 %
Lymphs Abs: 3.6 10*3/uL (ref 0.7–4.0)
MCH: 28.5 pg (ref 26.0–34.0)
MCHC: 35.1 g/dL (ref 30.0–36.0)
MCV: 81.2 fL (ref 80.0–100.0)
Monocytes Absolute: 0.7 10*3/uL (ref 0.1–1.0)
Monocytes Relative: 7 %
Neutro Abs: 5.8 10*3/uL (ref 1.7–7.7)
Neutrophils Relative %: 55 %
Platelets: 204 10*3/uL (ref 150–400)
RBC: 5.54 MIL/uL (ref 4.22–5.81)
RDW: 13.4 % (ref 11.5–15.5)
WBC: 10.3 10*3/uL (ref 4.0–10.5)
nRBC: 0 % (ref 0.0–0.2)

## 2022-06-23 LAB — COMPREHENSIVE METABOLIC PANEL
ALT: 32 U/L (ref 0–44)
AST: 23 U/L (ref 15–41)
Albumin: 4.4 g/dL (ref 3.5–5.0)
Alkaline Phosphatase: 89 U/L (ref 38–126)
Anion gap: 11 (ref 5–15)
BUN: 11 mg/dL (ref 6–20)
CO2: 26 mmol/L (ref 22–32)
Calcium: 9.4 mg/dL (ref 8.9–10.3)
Chloride: 99 mmol/L (ref 98–111)
Creatinine, Ser: 0.75 mg/dL (ref 0.61–1.24)
GFR, Estimated: >60 mL/min (ref >60)
Glucose, Bld: 336 mg/dL — ABNORMAL HIGH (ref 70–99)
Potassium: 3.4 mmol/L — ABNORMAL LOW (ref 3.5–5.1)
Sodium: 136 mmol/L (ref 135–145)
Total Bilirubin: 0.7 mg/dL (ref 0.3–1.2)
Total Protein: 8.2 g/dL — ABNORMAL HIGH (ref 6.5–8.1)

## 2022-06-23 LAB — URINALYSIS, ROUTINE W REFLEX MICROSCOPIC
Bacteria, UA: NONE SEEN
Bilirubin Urine: NEGATIVE
Glucose, UA: >=500 mg/dL — AB
Hgb urine dipstick: NEGATIVE
Ketones, ur: NEGATIVE mg/dL
Leukocytes,Ua: NEGATIVE
Nitrite: NEGATIVE
Protein, ur: NEGATIVE mg/dL
Specific Gravity, Urine: 1.038 — ABNORMAL HIGH (ref 1.005–1.030)
pH: 6 (ref 5.0–8.0)

## 2022-06-23 LAB — CBG MONITORING, ED: Glucose-Capillary: 361 mg/dL — ABNORMAL HIGH (ref 70–99)

## 2022-06-23 LAB — LIPASE, BLOOD: Lipase: 39 U/L (ref 11–51)

## 2022-06-23 MED ORDER — METOCLOPRAMIDE HCL 10 MG PO TABS: 10.0000 mg | ORAL_TABLET | Freq: Four times a day (QID) | ORAL | 0 refills | Status: DC

## 2022-06-23 MED ORDER — IOHEXOL 300 MG/ML  SOLN
100.0000 mL | Freq: Once | INTRAMUSCULAR | Status: AC | PRN
  Administered 2022-06-23: 100 mL via INTRAVENOUS

## 2022-06-23 MED ORDER — LACTATED RINGERS IV BOLUS
1000.0000 mL | Freq: Once | INTRAVENOUS | Status: AC
  Administered 2022-06-23: 1000 mL via INTRAVENOUS

## 2022-06-23 MED ORDER — MORPHINE SULFATE (PF) 4 MG/ML IV SOLN
4.0000 mg | Freq: Once | INTRAVENOUS | Status: AC
  Administered 2022-06-23: 4 mg via INTRAVENOUS
  Filled 2022-06-23: qty 1

## 2022-06-23 NOTE — Discharge Instructions (Signed)
Your labs and CT imaging were normal today, although your blood sugar was a bit elevated in 300s though this reportedly is normal for you.  I have sent you in a few tablets of a medication called Reglan, which is good for nausea, particular if you are a diabetic.  You can take this as needed to see if this helps with your symptoms.  Follow-up with your PCP as needed.

## 2022-06-23 NOTE — ED Triage Notes (Addendum)
Pt to the ED with complaints of abdominal pain  since last night with constipation.  Pt has a history of a bowel obstruction. 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

## 2022-06-23 NOTE — ED Provider Notes (Signed)
[facility] EMERGENCY DEPARTMENT Provider Note   CSN: [id number] Arrival date & time: 06/23/22  1409     History  Chief Complaint  Patient presents with   Abdominal Pain    [name] is a 41 y.o. 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  Pain is described as sharp, stabbing and constant, radiating deep into his lower back.  He also reports some swelling around the area and a "knot".  Pain is worse when laying flat on his back.  No alleviating factors.  He took Tylenol without relief.  Patient is also having some nausea but denies vomiting.  He has occasional constipation with last bowel movement yesterday.  Patient had his ostomy surgery done by Dr. Alvino Blood, but he has not followed up with him or with his GI doctor since he had a hernia mesh repair done last year.  Patient states he is currently getting a referral to a GI doctor from his neurologist.  He denies chest pain, shortness of breath, fevers, chills, vomiting and diarrhea.   Abdominal Pain Associated symptoms: no diarrhea, no fever, no nausea, no shortness of breath and no vomiting        Home Medications Prior to Admission medications   Medication Sig Start Date End Date Taking? Authorizing Provider  metoCLOPramide (REGLAN) 10 MG tablet Take 1 tablet (10 mg total) by mouth every 6 (six) hours. 06/23/22  Yes Kathe Becton R, PA-C  atorvastatin (LIPITOR) 40 MG tablet Take 1 tablet (40 mg total) by mouth daily. 08/22/21   Shary Key, DO  Blood Glucose Monitoring Suppl (ONE TOUCH ULTRA 2) w/Device KIT Measure blood sugar daily and with meals for insulin 10/22/21   Brita Romp, NP  cloNIDine HCl (KAPVAY) 0.1 MG TB12 ER tablet Take 0.1 mg by mouth daily. Patient not taking: Reported on 02/06/2022 09/18/21    [provider]  FLUoxetine (PROZAC) 40 MG capsule Take 40 mg by mouth at bedtime. 12/03/21   [provider]  glipiZIDE (GLUCOTROL XL) 5 MG 24 hr tablet Take 1 tablet (5 mg total) by mouth daily with breakfast. 12/09/21   Brita Romp, NP  glucose blood (ONETOUCH ULTRA) test strip Use as instructed to monitor glucose 4 times daily 10/22/21   Brita Romp, NP  Ibuprofen-diphenhydrAMINE Cit (ADVIL PM PO) Take 1 tablet by mouth at bedtime as needed (sleep).    [provider]  insulin glargine (LANTUS) 100 UNIT/ML injection Inject 0.4 mLs (40 Units total) into the skin at bedtime. 12/09/21   Brita Romp, NP  insulin lispro (HUMALOG) 100 UNIT/ML injection Inject 0.05-0.11 mLs (5-11 Units total) into the skin 3 (three) times daily with meals. 12/09/21   Brita Romp, NP  Insulin Pen Needle 32G X 6 MM MISC 1 packet by Does not apply route 2 (two) times daily. 09/11/21   Sharion Settler, DO  INSULIN SYRINGE .5CC/29G 29G X 1/2" 0.5 ML MISC Use to inject insulin 4 times daily 12/09/21   Brita Romp, NP  Lancets [facility] ULTRASOFT) lancets Use as instructed to monitor glucose 4 times daily 10/22/21   Brita Romp, NP  LATUDA 60 MG TABS Take 1 tablet by mouth daily. 09/18/21   [provider]  loperamide (IMODIUM A-D) 2 MG tablet Take 1 tablet (2  mg total) by mouth 4 (four) times daily as needed for diarrhea or loose stools. 06/04/21   Gladys Damme, MD  LORazepam (ATIVAN) 1 MG tablet Take 0.5-1 mg by mouth daily as needed for anxiety. 09/19/21   [provider]  omeprazole (PRILOSEC) 20 MG capsule TAKE 2 CAPSULES (40 MG TOTAL) BY MOUTH AT BEDTIME. Patient taking differently: Take 40 mg by mouth daily. 08/13/21   Sharion Settler, DO  potassium chloride SA (KLOR-CON M) 20 MEQ tablet Take 20 mEq by mouth daily. Patient not taking: Reported on 02/06/2022 10/31/21   [provider]  propranolol (INDERAL) 20 MG tablet  SMARTSIG:0.5-1 Tablet(s) By Mouth 2-3 Times Daily PRN 11/29/21   [provider]  traMADol (ULTRAM) 50 MG tablet Take 50 mg by mouth 2 (two) times daily. 12/30/21   [provider]  traZODone (DESYREL) 100 MG tablet Take 50-100 mg by mouth at bedtime. 12/05/19   [provider]  VYVANSE 20 MG capsule Take 20 mg by mouth daily with breakfast. Take with 50 mg for a total of 70 mg 06/28/18   [provider]  VYVANSE 50 MG capsule Take 50 mg by mouth See admin instructions. Take with 20 mg for a total of 70 mg in the morning 06/28/18   [provider]      Allergies    Metformin and related, Penicillins, and Nsaids    Review of Systems   Review of Systems  Constitutional:  Negative for fever.  Respiratory:  Negative for shortness of breath.   Gastrointestinal:  Positive for abdominal pain. Negative for diarrhea, nausea and vomiting.    Physical Exam Updated Vital Signs BP (!) 137/90 (BP Location: Left Arm)   Pulse 90   Temp 98.1 F (36.7 C) (Oral)   Resp 20   SpO2 97%  Physical Exam Vitals and nursing note reviewed.  Constitutional:      General: He is not in acute distress.    Appearance: He is not ill-appearing.  HENT:     Head: Atraumatic.  Eyes:     Conjunctiva/sclera: Conjunctivae normal.  Cardiovascular:     Rate and Rhythm: Normal rate and regular rhythm.     Pulses: Normal pulses.     Heart sounds: No murmur heard. Pulmonary:     Effort: Pulmonary effort is normal. No respiratory distress.     Breath sounds: Normal breath sounds.  Abdominal:     General: Abdomen is flat. There is no distension.     Palpations: Abdomen is soft.     Tenderness: There is abdominal tenderness. There is no right CVA tenderness or left CVA tenderness. Negative signs include Murphy's sign.     Comments: Numerous surgical scars with midline incision scar and ostomy scar on RUQ. Tenderness to palpation on right mid-abdomen  Musculoskeletal:         General: Normal range of motion.     Cervical back: Normal range of motion.  Skin:    General: Skin is warm and dry.     Capillary Refill: Capillary refill takes less than 2 seconds.  Neurological:     General: No focal deficit present.     Mental Status: He is alert.  Psychiatric:        Mood and Affect: Mood normal.     ED Results / Procedures / Treatments   Labs (all labs ordered are listed, but only abnormal results are displayed) Labs Reviewed  COMPREHENSIVE METABOLIC PANEL - Abnormal; Notable for the following components:  Result Value   Potassium 3.4 (*)    Glucose, Bld 336 (*)    Total Protein 8.2 (*)    All other components within normal limits  URINALYSIS, ROUTINE W REFLEX MICROSCOPIC - Abnormal; Notable for the following components:   Specific Gravity, Urine 1.038 (*)    Glucose, UA >=500 (*)    All other components within normal limits  CBG MONITORING, ED - Abnormal; Notable for the following components:   Glucose-Capillary 361 (*)    All other components within normal limits  CBC WITH DIFFERENTIAL/PLATELET  LIPASE, BLOOD    EKG None  Radiology CT ABDOMEN PELVIS W CONTRAST  Result Date: 06/23/2022 CLINICAL DATA:  Abdominal pain, constipation EXAM: CT ABDOMEN AND PELVIS WITH CONTRAST TECHNIQUE: Multidetector CT imaging of the abdomen and pelvis was performed using the standard protocol following bolus administration of intravenous contrast. RADIATION DOSE REDUCTION: This exam was performed according to the departmental dose-optimization program which includes automated exposure control, adjustment of the mA and/or kV according to patient size and/or use of iterative reconstruction technique. CONTRAST:  OMNIPAQUE IOHEXOL 300 MG/ML  SOLN COMPARISON:  CT done on 12/18/2021, abdomen radiographs done on 04/30/2022 FINDINGS: Lower chest: Unremarkable. Hepatobiliary: Liver measures 18.7 cm in length. No focal abnormalities are seen. Gallbladder is unremarkable.  Pancreas: No focal abnormalities are seen. Spleen: Spleen measures 13.2 cm in maximum diameter. Adrenals/Urinary Tract: Adrenals are unremarkable. There is no hydronephrosis. There are no renal or ureteral stones. Urinary bladder is unremarkable. Stomach/Bowel: Stomach is unremarkable. Small bowel loops are not dilated. Appendix is not seen. Surgical staples are seen in hepatic flexure. There is no wall thickening in colon. Surgical staples are seen in sigmoid. There is no pericolic stranding. Vascular/Lymphatic: Left renal vein is retroaortic. Scattered atherosclerotic plaques and calcifications are seen in aorta. Reproductive: Coarse calcification is seen in prostate. Other: There is no ascites or pneumoperitoneum. Umbilical hernia containing fat is seen. There is linear density in subcutaneous plane in the right upper abdomen, possibly site of previous surgery. Musculoskeletal: Degenerative changes are noted with spinal stenosis and encroachment of neural foramina at the L3-L4 level. IMPRESSION: There is no evidence of intestinal obstruction or pneumoperitoneum. There is no hydronephrosis. Enlarged liver and spleen. Other findings as described in the body of the report. Electronically Signed   By: Ernie Avena M.D.   On: 06/23/2022 16:44    Procedures Procedures    Medications Ordered in ED Medications  lactated ringers bolus 1,000 mL (0 mLs Intravenous Stopped 06/23/22 1742)  morphine (PF) 4 MG/ML injection 4 mg (4 mg Intravenous Given 06/23/22 1618)  iohexol (OMNIPAQUE) 300 MG/ML solution 100 mL (100 mLs Intravenous Contrast Given 06/23/22 1631)    ED Course/ Medical Decision Making/ A&P                           Medical Decision Making Amount and/or Complexity of Data Reviewed Labs: ordered. Radiology: ordered.  Risk Prescription drug management.   Social determinants of health:  Social History   Socioeconomic History   Marital status: Married    Spouse name: Not on file    Number of children: 2   Years of education: 12   Highest education level: Not on file  Occupational History    Employer: UNEMPLOYED  Tobacco Use   Smoking status: Every Day    Packs/day: 1.50    Years: 21.00    Total pack years: 31.50    Types: Cigarettes  Smokeless tobacco: Never  Vaping Use   Vaping Use: Former   Devices: Increased nicotine cravings  Substance and Sexual Activity   Alcohol use: No    Alcohol/week: 0.0 standard drinks of alcohol    Comment: quit 2013   Drug use: Not Currently    Types: Marijuana    Comment: 2009 last use   Sexual activity: Yes    Birth control/protection: None  Other Topics Concern   Not on file  Social History Narrative   Patient lives at home with parents, brother and brothers wife.    Patient is single.    Patient has 2 children.    Patient has a high school education.    Patient is unemployed.    Patient is right handed.    Social Determinants of Health   Financial Resource Strain: Not on file  Food Insecurity: Not on file  Transportation Needs: Not on file  Physical Activity: Not on file  Stress: Not on file  Social Connections: Not on file  Intimate Partner Violence: Not on file     Initial impression:  This patient presents to the ED for concern of abdominal pain, this involves an extensive number of treatment options, and is a complaint that carries with it a high risk of complications and morbidity.   Differentials include appendicitis, diverticulitis, pancreatitis, SBO, perforation, hernia.   Comorbidities affecting care:  Per HPI  Additional history obtained: Wife, chart review  Lab Tests  I Ordered, reviewed, and interpreted labs and EKG.  The pertinent results include:  CMP, CBC, UA, lipase unremarkable  Imaging Studies ordered:  I ordered imaging studies including  CT abdomen pelvis which was without acute findings I independently visualized and interpreted imaging and I agree with the radiologist  interpretation.   Medicines ordered and prescription drug management:  I ordered medication including: 1 L LR bolus Morphine $RemoveBefore'4mg'NdLcfNhFmauld$    Reevaluation of the patient after these medicines showed that the patient improved I have reviewed the patients home medicines and have made adjustments as needed   ED Course/Re-evaluation: Patient presents in no acute distress and is nontoxic-appearing.  Vitals are without acute abnormality.  On exam, he has tenderness on the right side of his abdomen without obvious distention.  Negative CVA tenderness, negative McBurney, negative Murphy.  He was given 1 L of fluids along with morphine 4 mg with improvement in symptoms.  Additionally CT abdomen pelvis was without acute findings.  Labs were normal.  Discussed with patient that this could all be related to ongoing postoperative pain given his extensive abdominal surgeries, or could possibly be something such as diabetic gastroparesis.  I discussed with patient that he may benefit from a dose of Reglan here in the ED, but patient does not want to wait for more treatment. He would prefer discharge home.  Given that he is not actively vomiting and is having fairly normal bowel movements, this is a reasonable option.  I will plan to discharge patient home with an oral prescription of Reglan to use for his nausea.  Return precautions were discussed.  Patient is understanding and amenable to plan.  Disposition:  After consideration of the diagnostic results, physical exam, history and the patients response to treatment feel that the patent would benefit from discharge.   Right lower quadrant abdominal pain: Plan and management as described above. Discharged home in good condition.   Final Clinical Impression(s) / ED Diagnoses Final diagnoses:  Right lower quadrant abdominal pain    Rx /  DC Orders ED Discharge Orders          Ordered    metoCLOPramide (REGLAN) 10 MG tablet  Every 6 hours        06/23/22 1735               Rodena Piety 06/23/22 1744    Hayden Rasmussen, MD 06/24/22 1145

## 2022-06-25 DIAGNOSIS — Z79899 Other long term (current) drug therapy: Secondary | ICD-10-CM | POA: Diagnosis not present

## 2022-06-25 DIAGNOSIS — M25561 Pain in right knee: Secondary | ICD-10-CM | POA: Diagnosis not present

## 2022-06-25 DIAGNOSIS — F17210 Nicotine dependence, cigarettes, uncomplicated: Secondary | ICD-10-CM | POA: Diagnosis not present

## 2022-06-25 DIAGNOSIS — G8929 Other chronic pain: Secondary | ICD-10-CM | POA: Diagnosis not present

## 2022-06-25 DIAGNOSIS — R109 Unspecified abdominal pain: Secondary | ICD-10-CM | POA: Diagnosis not present

## 2022-06-25 DIAGNOSIS — F32A Depression, unspecified: Secondary | ICD-10-CM | POA: Diagnosis not present

## 2022-06-26 DIAGNOSIS — G603 Idiopathic progressive neuropathy: Secondary | ICD-10-CM | POA: Diagnosis not present

## 2022-07-09 DIAGNOSIS — Z7182 Exercise counseling: Secondary | ICD-10-CM | POA: Diagnosis not present

## 2022-07-09 DIAGNOSIS — E785 Hyperlipidemia, unspecified: Secondary | ICD-10-CM | POA: Diagnosis not present

## 2022-07-09 DIAGNOSIS — R109 Unspecified abdominal pain: Secondary | ICD-10-CM | POA: Diagnosis not present

## 2022-07-09 DIAGNOSIS — E1165 Type 2 diabetes mellitus with hyperglycemia: Secondary | ICD-10-CM | POA: Diagnosis not present

## 2022-07-09 DIAGNOSIS — Z713 Dietary counseling and surveillance: Secondary | ICD-10-CM | POA: Diagnosis not present

## 2022-07-09 DIAGNOSIS — K219 Gastro-esophageal reflux disease without esophagitis: Secondary | ICD-10-CM | POA: Diagnosis not present

## 2022-07-21 DIAGNOSIS — E11311 Type 2 diabetes mellitus with unspecified diabetic retinopathy with macular edema: Secondary | ICD-10-CM | POA: Diagnosis not present

## 2022-07-29 ENCOUNTER — Telehealth: Payer: Self-pay

## 2022-07-29 DIAGNOSIS — A084 Viral intestinal infection, unspecified: Secondary | ICD-10-CM | POA: Diagnosis not present

## 2022-07-29 DIAGNOSIS — R509 Fever, unspecified: Secondary | ICD-10-CM | POA: Diagnosis not present

## 2022-07-29 NOTE — Patient Outreach (Signed)
  Care Coordination   07/29/2022 Name: TENG DECOU MRN: [id number] DOB: 1981/09/02   Care Coordination Outreach Attempts:  An unsuccessful telephone outreach was attempted today to offer the patient information about available care coordination services as a benefit of their health plan.   Follow Up Plan:  Additional outreach attempts will be made to offer the patient care coordination information and services.   Encounter Outcome:  No Answer  Care Coordination Interventions Activated:  No   Care Coordination Interventions:  No, not indicated    Jone Baseman, RN, MSN [facility] Care Management Care Management Coordinator Direct Line [phone]

## 2022-08-13 ENCOUNTER — Telehealth: Payer: Self-pay

## 2022-08-13 NOTE — Patient Outreach (Signed)
  Care Coordination   08/13/2022 Name: [name] MRN: [id number] DOB: [birth year]   Care Coordination Outreach Attempts:  A second unsuccessful outreach was attempted today to offer the patient with information about available care coordination services as a benefit of their health plan.     Follow Up Plan:  Additional outreach attempts will be made to offer the patient care coordination information and services.   Encounter Outcome:  No Answer  Care Coordination Interventions Activated:  No   Care Coordination Interventions:  No, not indicated    Jone Baseman, RN, MSN [facility] Care Management Care Management Coordinator Direct Line [phone]

## 2022-08-21 DIAGNOSIS — Z79899 Other long term (current) drug therapy: Secondary | ICD-10-CM | POA: Diagnosis not present

## 2022-08-22 ENCOUNTER — Emergency Department (HOSPITAL_COMMUNITY)
Admission: EM | Admit: 2022-08-22 | Discharge: 2022-08-22 | Disposition: A | Discharge status: Home / Self Care | Payer: Medicare Other | Attending: Emergency Medicine | Admitting: Emergency Medicine

## 2022-08-22 DIAGNOSIS — E119 Type 2 diabetes mellitus without complications: Secondary | ICD-10-CM | POA: Insufficient documentation

## 2022-08-22 DIAGNOSIS — M5442 Lumbago with sciatica, left side: Secondary | ICD-10-CM | POA: Diagnosis not present

## 2022-08-22 DIAGNOSIS — Z79899 Other long term (current) drug therapy: Secondary | ICD-10-CM | POA: Insufficient documentation

## 2022-08-22 DIAGNOSIS — Z7984 Long term (current) use of oral hypoglycemic drugs: Secondary | ICD-10-CM | POA: Insufficient documentation

## 2022-08-22 DIAGNOSIS — Z794 Long term (current) use of insulin: Secondary | ICD-10-CM | POA: Insufficient documentation

## 2022-08-22 DIAGNOSIS — M549 Dorsalgia, unspecified: Secondary | ICD-10-CM | POA: Diagnosis present

## 2022-08-22 LAB — LIPASE, BLOOD: Lipase: 37 U/L (ref 11–51)

## 2022-08-22 LAB — COMPREHENSIVE METABOLIC PANEL
ALT: 44 U/L (ref 0–44)
AST: 34 U/L (ref 15–41)
Albumin: 4.6 g/dL (ref 3.5–5.0)
Alkaline Phosphatase: 89 U/L (ref 38–126)
Anion gap: 10 (ref 5–15)
BUN: 15 mg/dL (ref 6–20)
CO2: 27 mmol/L (ref 22–32)
Calcium: 9 mg/dL (ref 8.9–10.3)
Chloride: 97 mmol/L — ABNORMAL LOW (ref 98–111)
Creatinine, Ser: 0.78 mg/dL (ref 0.61–1.24)
GFR, Estimated: >60 mL/min (ref >60)
Glucose, Bld: 279 mg/dL — ABNORMAL HIGH (ref 70–99)
Potassium: 3.5 mmol/L (ref 3.5–5.1)
Sodium: 134 mmol/L — ABNORMAL LOW (ref 135–145)
Total Bilirubin: 0.6 mg/dL (ref 0.3–1.2)
Total Protein: 8.4 g/dL — ABNORMAL HIGH (ref 6.5–8.1)

## 2022-08-22 LAB — CBC
HCT: 47.7 % (ref 39.0–52.0)
Hemoglobin: 16.6 g/dL (ref 13.0–17.0)
MCH: 28.8 pg (ref 26.0–34.0)
MCHC: 34.8 g/dL (ref 30.0–36.0)
MCV: 82.7 fL (ref 80.0–100.0)
Platelets: 199 10*3/uL (ref 150–400)
RBC: 5.77 MIL/uL (ref 4.22–5.81)
RDW: 13.5 % (ref 11.5–15.5)
WBC: 11.3 10*3/uL — ABNORMAL HIGH (ref 4.0–10.5)
nRBC: 0 % (ref 0.0–0.2)

## 2022-08-22 LAB — CBG MONITORING, ED: Glucose-Capillary: 298 mg/dL — ABNORMAL HIGH (ref 70–99)

## 2022-08-22 MED ORDER — LIDOCAINE 5 % EX PTCH
1.0000 | MEDICATED_PATCH | CUTANEOUS | Status: DC
  Administered 2022-08-22: 1 via TRANSDERMAL
  Filled 2022-08-22: qty 1

## 2022-08-22 MED ORDER — CYCLOBENZAPRINE HCL 10 MG PO TABS
10.0000 mg | ORAL_TABLET | Freq: Once | ORAL | Status: AC
  Administered 2022-08-22: 10 mg via ORAL
  Filled 2022-08-22: qty 1

## 2022-08-22 MED ORDER — LIDOCAINE 5 % EX PTCH: 1.0000 | MEDICATED_PATCH | CUTANEOUS | 0 refills | Status: DC

## 2022-08-22 MED ORDER — CYCLOBENZAPRINE HCL 10 MG PO TABS: 10.0000 mg | ORAL_TABLET | Freq: Two times a day (BID) | ORAL | 0 refills | Status: DC | PRN

## 2022-08-22 NOTE — ED Notes (Signed)
CBG 298 in triage

## 2022-08-22 NOTE — ED Provider Notes (Signed)
[facility] EMERGENCY DEPARTMENT Provider Note   CSN: [id number] Arrival date & time: 08/22/22  1637     History  Chief Complaint  Patient presents with   Back Pain   Abdominal Pain    [name] is a 41 y.o. male.  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  Patient states that today started having back pain that radiates to his left leg.  Says that [name] has a history of back problems but that the pain was severe so [name] decided to come in for evaluation.  Says that it is sharp and radiates down his left leg.  Denies any bowel or bladder incontinence or changes in sensation when [name] wipes.  No numbness or weakness.  Says [name] had nausea but no vomiting.  No fevers and no history of IVDU or prolonged catheters such as PICC lines.  Has had increased urinary frequency but no dysuria.       Home Medications Prior to Admission medications   Medication Sig Start Date End Date Taking? Authorizing Provider  cyclobenzaprine (FLEXERIL) 10 MG tablet Take 1 tablet (10 mg total) by mouth 2 (two) times daily as needed for muscle spasms. 08/22/22  Yes Fransico Meadow, MD  lidocaine (LIDODERM) 5 % Place 1 patch onto the skin daily. Remove & Discard patch within 12 hours or as directed by MD 08/22/22  Yes Fransico Meadow, MD  atorvastatin (LIPITOR) 40 MG tablet Take 1 tablet (40 mg total) by mouth daily. 08/22/21   Shary Key, DO  Blood Glucose Monitoring Suppl (ONE TOUCH ULTRA 2) w/Device KIT Measure blood sugar daily and with meals for insulin 10/22/21   Brita Romp, NP  cloNIDine HCl (KAPVAY) 0.1 MG TB12 ER tablet Take 0.1 mg by mouth daily. Patient not taking: Reported on 02/06/2022 09/18/21   [provider]  FLUoxetine (PROZAC) 40 MG capsule Take 40 mg by mouth at bedtime. 12/03/21    [provider]  glipiZIDE (GLUCOTROL XL) 5 MG 24 hr tablet Take 1 tablet (5 mg total) by mouth daily with breakfast. 12/09/21   Brita Romp, NP  glucose blood (ONETOUCH ULTRA) test strip Use as instructed to monitor glucose 4 times daily 10/22/21   Brita Romp, NP  Ibuprofen-diphenhydrAMINE Cit (ADVIL PM PO) Take 1 tablet by mouth at bedtime as needed (sleep).    [provider]  insulin glargine (LANTUS) 100 UNIT/ML injection Inject 0.4 mLs (40 Units total) into the skin at bedtime. 12/09/21   Brita Romp, NP  insulin lispro (HUMALOG) 100 UNIT/ML injection Inject 0.05-0.11 mLs (5-11 Units total) into the skin 3 (three) times daily with meals. 12/09/21   Brita Romp, NP  Insulin Pen Needle 32G X 6 MM MISC 1 packet by Does not apply route 2 (two) times daily. 09/11/21   Sharion Settler, DO  INSULIN SYRINGE .5CC/29G 29G X 1/2" 0.5 ML MISC Use to inject insulin 4 times daily 12/09/21   Brita Romp, NP  Lancets [facility] ULTRASOFT) lancets Use as instructed to monitor glucose 4 times daily 10/22/21   Brita Romp, NP  LATUDA 60 MG TABS Take 1 tablet by mouth daily. 09/18/21   [provider]  loperamide (IMODIUM A-D) 2 MG tablet Take 1 tablet (2 mg total) by mouth 4 (four) times daily  as needed for diarrhea or loose stools. 06/04/21   Gladys Damme, MD  LORazepam (ATIVAN) 1 MG tablet Take 0.5-1 mg by mouth daily as needed for anxiety. 09/19/21   [provider]  metoCLOPramide (REGLAN) 10 MG tablet Take 1 tablet (10 mg total) by mouth every 6 (six) hours. 06/23/22   Tonye Pearson, PA-C  omeprazole (PRILOSEC) 20 MG capsule TAKE 2 CAPSULES (40 MG TOTAL) BY MOUTH AT BEDTIME. Patient taking differently: Take 40 mg by mouth daily. 08/13/21   Sharion Settler, DO  potassium chloride SA (KLOR-CON M) 20 MEQ tablet Take 20 mEq by mouth daily. Patient not taking: Reported on 02/06/2022 10/31/21   [provider]  propranolol  (INDERAL) 20 MG tablet SMARTSIG:0.5-1 Tablet(s) By Mouth 2-3 Times Daily PRN 11/29/21   [provider]  traMADol (ULTRAM) 50 MG tablet Take 50 mg by mouth 2 (two) times daily. 12/30/21   [provider]  traZODone (DESYREL) 100 MG tablet Take 50-100 mg by mouth at bedtime. 12/05/19   [provider]  VYVANSE 20 MG capsule Take 20 mg by mouth daily with breakfast. Take with 50 mg for a total of 70 mg 06/28/18   [provider]  VYVANSE 50 MG capsule Take 50 mg by mouth See admin instructions. Take with 20 mg for a total of 70 mg in the morning 06/28/18   [provider]      Allergies    Metformin and related, Penicillins, and Nsaids    Review of Systems   Review of Systems  Physical Exam Updated Vital Signs BP (!) 140/84   Pulse 89   Temp 97.7 F (36.5 C) (Oral)   Resp 17   Ht _0  (1.702 m)   Wt 97.5 kg   SpO2 95%   BMI 33.67 kg/m  Physical Exam Vitals and nursing note reviewed.  Constitutional:      General: [name] is not in acute distress.    Appearance: [name] is well-developed.  HENT:     Head: Normocephalic and atraumatic.     Right Ear: External ear normal.     Left Ear: External ear normal.     Nose: Nose normal.  Eyes:     Extraocular Movements: Extraocular movements intact.     Conjunctiva/sclera: Conjunctivae normal.     Pupils: Pupils are equal, round, and reactive to light.  Cardiovascular:     Rate and Rhythm: Normal rate and regular rhythm.  Pulmonary:     Effort: Pulmonary effort is normal. No respiratory distress.  Abdominal:     General: There is no distension.     Palpations: Abdomen is soft. There is no mass.     Tenderness: There is no abdominal tenderness. There is left CVA tenderness. There is no right CVA tenderness or guarding.  Musculoskeletal:        General: No swelling.     Cervical back: Normal range of motion and neck supple.     Comments: No spinal midline TTP in cervical, thoracic, or lumbar spine. No  stepoffs noted.   Motor: Muscle bulk and tone are normal. Strength is 5/5 in hip flexion, knee flexion and extension, ankle dorsiflexion and plantar flexion bilaterally. Full strength of great toe dorsiflexion bilaterally.  Sensory: Intact sensation to light touch in L2 though S1 dermatomes bilaterally.    Skin:    General: Skin is warm and dry.     Capillary Refill: Capillary refill takes less than 2 seconds.  Neurological:  Mental Status: [name] is alert. Mental status is at baseline.  Psychiatric:        Mood and Affect: Mood normal.        Behavior: Behavior normal.     ED Results / Procedures / Treatments   Labs (all labs ordered are listed, but only abnormal results are displayed) Labs Reviewed  COMPREHENSIVE METABOLIC PANEL - Abnormal; Notable for the following components:      Result Value   Sodium 134 (*)    Chloride 97 (*)    Glucose, Bld 279 (*)    Total Protein 8.4 (*)    All other components within normal limits  CBC - Abnormal; Notable for the following components:   WBC 11.3 (*)    All other components within normal limits  CBG MONITORING, ED - Abnormal; Notable for the following components:   Glucose-Capillary 298 (*)    All other components within normal limits  LIPASE, BLOOD  URINALYSIS, ROUTINE W REFLEX MICROSCOPIC    EKG None  Radiology No results found.  Procedures Procedures   Medications Ordered in ED Medications  lidocaine (LIDODERM) 5 % 1 patch (1 patch Transdermal Patch Applied 08/22/22 1810)  cyclobenzaprine (FLEXERIL) tablet 10 mg (10 mg Oral Given 08/22/22 1810)    ED Course/ Medical Decision Making/ A&P                           Medical Decision Making Amount and/or Complexity of Data Reviewed Labs: ordered.  Risk Prescription drug management.   CHAY MAZZONI is a 41 y.o. male with comorbidities that complicate the patient evaluation including diabetes, colectomy status post reversal, and chronic back pain who presents with  chief complaint of left flank pain radiating to his leg.  This patient presents to the ED for concern of complaints listed in HPI, this involves an extensive number of treatment options, and is a complaint that carries with it a high risk of complications and morbidity. Disposition including potential need for admission considered.   Initial Ddx:  Muscle strain, lumbar radiculopathy, spinal cord compression, pyelonephritis  MDM:  Initially concerned about lumbar radiculopathy given the radiation of the patient's pain and history of such.  Also could be due to a muscle strain.  No signs of spinal cord compression or cauda equina at this time but will obtain postvoid residual.  Also considered pyelonephritis given his CVA tenderness and increased urinary frequency so we will check a urine.  Plan:  Labs Urine Lidocaine patch Cyclobenzaprine Postvoid residual  ED Summary/Re-evaluation:  Patient reassessed and was feeling much better.  Was ambulating about the emergency department.  Requested to go before his urinalysis and postvoid residual.  Explained to the patient the importance of these tests but [name] preferred to go anyways.  Do feel that [name] has capacity to do so at this time.  11 follow-up with his primary doctor in several days and give him a short course of lidocaine and Flexeril.  Dispo: DC Home. Return precautions discussed including, but not limited to, those listed in the AVS. Allowed pt time to ask questions which were answered fully prior to dc.   Records reviewed ED Visit Notes I have reviewed the patients home medications and made adjustments as needed  Final Clinical Impression(s) / ED Diagnoses Final diagnoses:  Acute left-sided low back pain with left-sided sciatica    Rx / DC Orders ED Discharge Orders  Ordered    lidocaine (LIDODERM) 5 %  Every 24 hours        08/22/22 1929    cyclobenzaprine (FLEXERIL) 10 MG tablet  2 times daily PRN        08/22/22 1929               Fransico Meadow, MD 08/22/22 1930

## 2022-08-22 NOTE — Discharge Instructions (Signed)
Today you were seen in the emergency department for your back pain.    In the emergency department you were given medications which improved your pain.    At home, please take Tylenol and use lidocaine patches to give you for your pain.  You may take the Flexeril for any breakthrough pain that you have.  Do not take this before driving or operating heavy machinery as it can make you drowsy.    Check your MyChart online for the results of any tests that had not resulted by the time you left the emergency department.   Follow-up with your primary doctor in 2-3 days regarding your visit.    Return immediately to the emergency department if you experience any of the following: Worsening pain, fevers, numbness or weakness of your legs, or bowel or bladder incontinence, or any other concerning symptoms.    Thank you for visiting our Emergency Department. It was a pleasure taking care of you today.

## 2022-08-22 NOTE — ED Notes (Signed)
This RN to lobby to take pt to treatment room, pt not in lobby.

## 2022-08-22 NOTE — ED Triage Notes (Signed)
Pt to ED c/o "gassy stomach" and lower back pain that radiates to left leg. Reports these symptoms started yesterday.

## 2022-08-25 DIAGNOSIS — F32A Depression, unspecified: Secondary | ICD-10-CM | POA: Diagnosis not present

## 2022-08-25 DIAGNOSIS — R109 Unspecified abdominal pain: Secondary | ICD-10-CM | POA: Diagnosis not present

## 2022-08-25 DIAGNOSIS — M25561 Pain in right knee: Secondary | ICD-10-CM | POA: Diagnosis not present

## 2022-08-25 DIAGNOSIS — G894 Chronic pain syndrome: Secondary | ICD-10-CM | POA: Diagnosis not present

## 2022-08-25 DIAGNOSIS — G8929 Other chronic pain: Secondary | ICD-10-CM | POA: Diagnosis not present

## 2022-08-27 ENCOUNTER — Telehealth: Payer: Self-pay

## 2022-08-27 NOTE — Patient Outreach (Signed)
  Care Coordination   Initial Visit Note   08/27/2022 Name: [name] MRN: [id number] DOB: 1981/04/12  [name] is a 41 y.o. year old male who sees [place], [place] Dragon, FNP for primary care. I spoke with  Tobias Alexander by phone today.  What matters to the patients health and wellness today?  none    Goals Addressed             This Visit's Progress    COMPLETED: Care Coordination Activities-No follow up required       Care Coordination Interventions: Advised patient to Annual visit with physician. Patient states he has changed his PCP to Dr. Jerilee Hoh with [facility].            SDOH assessments and interventions completed:  Yes  SDOH Interventions Today    Flowsheet Row Most Recent Value  SDOH Interventions   Housing Interventions Intervention Not Indicated  Transportation Interventions Intervention Not Indicated        Care Coordination Interventions Activated:  Yes  Care Coordination Interventions:  Yes, provided   Follow up plan: No further intervention required.   Encounter Outcome:  Pt. Visit Completed   Jone Baseman, RN, MSN [place] [facility] Care Management Coordinator Direct Line [phone]

## 2022-08-27 NOTE — Patient Instructions (Signed)
Visit Information  Thank you for taking time to visit with me today. Please don't hesitate to contact me if I can be of assistance to you.   Following are the goals we discussed today:   Goals Addressed             This Visit's Progress    COMPLETED: Care Coordination Activities-No follow up required       Care Coordination Interventions: Advised patient to Annual visit with physician. Patient states he has changed his PCP to Dr. Jerilee Hoh with [facility].             If you are experiencing a Mental Health or [place] or need someone to talk to, please call the Suicide and Crisis Lifeline: 988   Patient verbalizes understanding of instructions and care plan provided today and agrees to view in [place]. Active MyChart status and patient understanding of how to access instructions and care plan via MyChart confirmed with patient.     No further follow up required: patient changed PCP  Jone Baseman, RN, MSN [facility] Care Management Coordinator Direct Line [phone]

## 2022-09-13 DIAGNOSIS — Z7984 Long term (current) use of oral hypoglycemic drugs: Secondary | ICD-10-CM | POA: Diagnosis not present

## 2022-09-13 DIAGNOSIS — Z888 Allergy status to other drugs, medicaments and biological substances status: Secondary | ICD-10-CM | POA: Diagnosis not present

## 2022-09-13 DIAGNOSIS — R109 Unspecified abdominal pain: Secondary | ICD-10-CM | POA: Diagnosis not present

## 2022-09-13 DIAGNOSIS — Z20822 Contact with and (suspected) exposure to covid-19: Secondary | ICD-10-CM | POA: Diagnosis not present

## 2022-09-13 DIAGNOSIS — F17210 Nicotine dependence, cigarettes, uncomplicated: Secondary | ICD-10-CM | POA: Diagnosis not present

## 2022-09-13 DIAGNOSIS — E785 Hyperlipidemia, unspecified: Secondary | ICD-10-CM | POA: Diagnosis not present

## 2022-09-13 DIAGNOSIS — Z880 Allergy status to penicillin: Secondary | ICD-10-CM | POA: Diagnosis not present

## 2022-09-13 DIAGNOSIS — E119 Type 2 diabetes mellitus without complications: Secondary | ICD-10-CM | POA: Diagnosis not present

## 2022-09-13 DIAGNOSIS — Z886 Allergy status to analgesic agent status: Secondary | ICD-10-CM | POA: Diagnosis not present

## 2022-09-13 DIAGNOSIS — R1032 Left lower quadrant pain: Secondary | ICD-10-CM | POA: Diagnosis not present

## 2022-09-13 DIAGNOSIS — R197 Diarrhea, unspecified: Secondary | ICD-10-CM | POA: Diagnosis not present

## 2022-09-18 DIAGNOSIS — Z79899 Other long term (current) drug therapy: Secondary | ICD-10-CM | POA: Diagnosis not present

## 2022-09-22 DIAGNOSIS — R109 Unspecified abdominal pain: Secondary | ICD-10-CM | POA: Diagnosis not present

## 2022-09-22 DIAGNOSIS — Z5321 Procedure and treatment not carried out due to patient leaving prior to being seen by health care provider: Secondary | ICD-10-CM | POA: Diagnosis not present

## 2022-09-22 DIAGNOSIS — R079 Chest pain, unspecified: Secondary | ICD-10-CM | POA: Diagnosis not present

## 2022-09-22 DIAGNOSIS — Z20822 Contact with and (suspected) exposure to covid-19: Secondary | ICD-10-CM | POA: Diagnosis not present

## 2022-09-22 DIAGNOSIS — R059 Cough, unspecified: Secondary | ICD-10-CM | POA: Diagnosis not present

## 2022-09-22 DIAGNOSIS — R42 Dizziness and giddiness: Secondary | ICD-10-CM | POA: Diagnosis not present

## 2022-09-24 DIAGNOSIS — Z7182 Exercise counseling: Secondary | ICD-10-CM | POA: Diagnosis not present

## 2022-09-24 DIAGNOSIS — Z713 Dietary counseling and surveillance: Secondary | ICD-10-CM | POA: Diagnosis not present

## 2022-09-24 DIAGNOSIS — E785 Hyperlipidemia, unspecified: Secondary | ICD-10-CM | POA: Diagnosis not present

## 2022-09-24 DIAGNOSIS — K219 Gastro-esophageal reflux disease without esophagitis: Secondary | ICD-10-CM | POA: Diagnosis not present

## 2022-09-24 DIAGNOSIS — E1165 Type 2 diabetes mellitus with hyperglycemia: Secondary | ICD-10-CM | POA: Diagnosis not present

## 2022-09-24 DIAGNOSIS — R109 Unspecified abdominal pain: Secondary | ICD-10-CM | POA: Diagnosis not present

## 2022-10-01 ENCOUNTER — Encounter: Payer: Self-pay | Admitting: Internal Medicine

## 2022-10-09 DIAGNOSIS — Z713 Dietary counseling and surveillance: Secondary | ICD-10-CM | POA: Diagnosis not present

## 2022-10-09 DIAGNOSIS — R109 Unspecified abdominal pain: Secondary | ICD-10-CM | POA: Diagnosis not present

## 2022-10-09 DIAGNOSIS — K219 Gastro-esophageal reflux disease without esophagitis: Secondary | ICD-10-CM | POA: Diagnosis not present

## 2022-10-09 DIAGNOSIS — Z7182 Exercise counseling: Secondary | ICD-10-CM | POA: Diagnosis not present

## 2022-10-09 DIAGNOSIS — F17200 Nicotine dependence, unspecified, uncomplicated: Secondary | ICD-10-CM | POA: Diagnosis not present

## 2022-10-09 DIAGNOSIS — E559 Vitamin D deficiency, unspecified: Secondary | ICD-10-CM | POA: Diagnosis not present

## 2022-10-09 DIAGNOSIS — E785 Hyperlipidemia, unspecified: Secondary | ICD-10-CM | POA: Diagnosis not present

## 2022-10-09 DIAGNOSIS — E1165 Type 2 diabetes mellitus with hyperglycemia: Secondary | ICD-10-CM | POA: Diagnosis not present

## 2022-10-12 DIAGNOSIS — Z20822 Contact with and (suspected) exposure to covid-19: Secondary | ICD-10-CM | POA: Diagnosis not present

## 2022-10-12 DIAGNOSIS — G8929 Other chronic pain: Secondary | ICD-10-CM | POA: Diagnosis not present

## 2022-10-12 DIAGNOSIS — Z7984 Long term (current) use of oral hypoglycemic drugs: Secondary | ICD-10-CM | POA: Diagnosis not present

## 2022-10-12 DIAGNOSIS — Z1152 Encounter for screening for COVID-19: Secondary | ICD-10-CM | POA: Diagnosis not present

## 2022-10-12 DIAGNOSIS — E1165 Type 2 diabetes mellitus with hyperglycemia: Secondary | ICD-10-CM | POA: Diagnosis not present

## 2022-10-12 DIAGNOSIS — Z880 Allergy status to penicillin: Secondary | ICD-10-CM | POA: Diagnosis not present

## 2022-10-12 DIAGNOSIS — Z9889 Other specified postprocedural states: Secondary | ICD-10-CM | POA: Diagnosis not present

## 2022-10-12 DIAGNOSIS — Z794 Long term (current) use of insulin: Secondary | ICD-10-CM | POA: Diagnosis not present

## 2022-10-12 DIAGNOSIS — Z7985 Long-term (current) use of injectable non-insulin antidiabetic drugs: Secondary | ICD-10-CM | POA: Diagnosis not present

## 2022-10-12 DIAGNOSIS — K219 Gastro-esophageal reflux disease without esophagitis: Secondary | ICD-10-CM | POA: Diagnosis not present

## 2022-10-12 DIAGNOSIS — R109 Unspecified abdominal pain: Secondary | ICD-10-CM | POA: Diagnosis not present

## 2022-10-12 DIAGNOSIS — R42 Dizziness and giddiness: Secondary | ICD-10-CM | POA: Diagnosis not present

## 2022-10-12 DIAGNOSIS — Z888 Allergy status to other drugs, medicaments and biological substances status: Secondary | ICD-10-CM | POA: Diagnosis not present

## 2022-10-12 DIAGNOSIS — Z886 Allergy status to analgesic agent status: Secondary | ICD-10-CM | POA: Diagnosis not present

## 2022-10-12 DIAGNOSIS — R197 Diarrhea, unspecified: Secondary | ICD-10-CM | POA: Diagnosis not present

## 2022-10-12 DIAGNOSIS — F17210 Nicotine dependence, cigarettes, uncomplicated: Secondary | ICD-10-CM | POA: Diagnosis not present

## 2022-10-14 DIAGNOSIS — R1032 Left lower quadrant pain: Secondary | ICD-10-CM | POA: Diagnosis not present

## 2022-10-14 DIAGNOSIS — Z5329 Procedure and treatment not carried out because of patient's decision for other reasons: Secondary | ICD-10-CM | POA: Diagnosis not present

## 2022-10-18 DIAGNOSIS — Z888 Allergy status to other drugs, medicaments and biological substances status: Secondary | ICD-10-CM | POA: Diagnosis not present

## 2022-10-18 DIAGNOSIS — Z7984 Long term (current) use of oral hypoglycemic drugs: Secondary | ICD-10-CM | POA: Diagnosis not present

## 2022-10-18 DIAGNOSIS — E119 Type 2 diabetes mellitus without complications: Secondary | ICD-10-CM | POA: Diagnosis not present

## 2022-10-18 DIAGNOSIS — Z794 Long term (current) use of insulin: Secondary | ICD-10-CM | POA: Diagnosis not present

## 2022-10-18 DIAGNOSIS — R197 Diarrhea, unspecified: Secondary | ICD-10-CM | POA: Diagnosis not present

## 2022-10-18 DIAGNOSIS — Z886 Allergy status to analgesic agent status: Secondary | ICD-10-CM | POA: Diagnosis not present

## 2022-10-18 DIAGNOSIS — R109 Unspecified abdominal pain: Secondary | ICD-10-CM | POA: Diagnosis not present

## 2022-10-18 DIAGNOSIS — F17210 Nicotine dependence, cigarettes, uncomplicated: Secondary | ICD-10-CM | POA: Diagnosis not present

## 2022-10-18 DIAGNOSIS — Z880 Allergy status to penicillin: Secondary | ICD-10-CM | POA: Diagnosis not present

## 2022-10-18 DIAGNOSIS — Z79899 Other long term (current) drug therapy: Secondary | ICD-10-CM | POA: Diagnosis not present

## 2022-10-18 DIAGNOSIS — R1010 Upper abdominal pain, unspecified: Secondary | ICD-10-CM | POA: Diagnosis not present

## 2022-10-29 DIAGNOSIS — Z7984 Long term (current) use of oral hypoglycemic drugs: Secondary | ICD-10-CM | POA: Diagnosis not present

## 2022-10-29 DIAGNOSIS — Z880 Allergy status to penicillin: Secondary | ICD-10-CM | POA: Diagnosis not present

## 2022-10-29 DIAGNOSIS — E785 Hyperlipidemia, unspecified: Secondary | ICD-10-CM | POA: Diagnosis not present

## 2022-10-29 DIAGNOSIS — F17210 Nicotine dependence, cigarettes, uncomplicated: Secondary | ICD-10-CM | POA: Diagnosis not present

## 2022-10-29 DIAGNOSIS — Z794 Long term (current) use of insulin: Secondary | ICD-10-CM | POA: Diagnosis not present

## 2022-10-29 DIAGNOSIS — Z5321 Procedure and treatment not carried out due to patient leaving prior to being seen by health care provider: Secondary | ICD-10-CM | POA: Diagnosis not present

## 2022-10-29 DIAGNOSIS — E119 Type 2 diabetes mellitus without complications: Secondary | ICD-10-CM | POA: Diagnosis not present

## 2022-10-29 DIAGNOSIS — R197 Diarrhea, unspecified: Secondary | ICD-10-CM | POA: Diagnosis not present

## 2022-10-29 DIAGNOSIS — R109 Unspecified abdominal pain: Secondary | ICD-10-CM | POA: Diagnosis not present

## 2022-11-03 DIAGNOSIS — E7800 Pure hypercholesterolemia, unspecified: Secondary | ICD-10-CM | POA: Diagnosis not present

## 2022-11-03 DIAGNOSIS — Z794 Long term (current) use of insulin: Secondary | ICD-10-CM | POA: Diagnosis not present

## 2022-11-03 DIAGNOSIS — E119 Type 2 diabetes mellitus without complications: Secondary | ICD-10-CM | POA: Diagnosis not present

## 2022-11-03 DIAGNOSIS — Z79899 Other long term (current) drug therapy: Secondary | ICD-10-CM | POA: Diagnosis not present

## 2022-11-05 DIAGNOSIS — Z79899 Other long term (current) drug therapy: Secondary | ICD-10-CM | POA: Diagnosis not present

## 2022-11-05 DIAGNOSIS — R519 Headache, unspecified: Secondary | ICD-10-CM | POA: Diagnosis not present

## 2022-11-05 DIAGNOSIS — F17210 Nicotine dependence, cigarettes, uncomplicated: Secondary | ICD-10-CM | POA: Diagnosis not present

## 2022-11-05 DIAGNOSIS — Z880 Allergy status to penicillin: Secondary | ICD-10-CM | POA: Diagnosis not present

## 2022-11-05 DIAGNOSIS — R109 Unspecified abdominal pain: Secondary | ICD-10-CM | POA: Diagnosis not present

## 2022-11-05 DIAGNOSIS — M542 Cervicalgia: Secondary | ICD-10-CM | POA: Diagnosis not present

## 2022-11-05 DIAGNOSIS — Z7984 Long term (current) use of oral hypoglycemic drugs: Secondary | ICD-10-CM | POA: Diagnosis not present

## 2022-11-05 DIAGNOSIS — Z794 Long term (current) use of insulin: Secondary | ICD-10-CM | POA: Diagnosis not present

## 2022-11-05 DIAGNOSIS — G8929 Other chronic pain: Secondary | ICD-10-CM | POA: Diagnosis not present

## 2022-11-05 DIAGNOSIS — Z9109 Other allergy status, other than to drugs and biological substances: Secondary | ICD-10-CM | POA: Diagnosis not present

## 2022-11-13 DIAGNOSIS — F17210 Nicotine dependence, cigarettes, uncomplicated: Secondary | ICD-10-CM | POA: Diagnosis not present

## 2022-11-13 DIAGNOSIS — R109 Unspecified abdominal pain: Secondary | ICD-10-CM | POA: Diagnosis not present

## 2022-11-13 DIAGNOSIS — Z7984 Long term (current) use of oral hypoglycemic drugs: Secondary | ICD-10-CM | POA: Diagnosis not present

## 2022-11-13 DIAGNOSIS — E785 Hyperlipidemia, unspecified: Secondary | ICD-10-CM | POA: Diagnosis not present

## 2022-11-13 DIAGNOSIS — R1032 Left lower quadrant pain: Secondary | ICD-10-CM | POA: Diagnosis not present

## 2022-11-13 DIAGNOSIS — E119 Type 2 diabetes mellitus without complications: Secondary | ICD-10-CM | POA: Diagnosis not present

## 2022-11-13 DIAGNOSIS — Z79899 Other long term (current) drug therapy: Secondary | ICD-10-CM | POA: Diagnosis not present

## 2022-11-13 DIAGNOSIS — Z880 Allergy status to penicillin: Secondary | ICD-10-CM | POA: Diagnosis not present

## 2022-11-13 DIAGNOSIS — K529 Noninfective gastroenteritis and colitis, unspecified: Secondary | ICD-10-CM | POA: Diagnosis not present

## 2022-11-13 DIAGNOSIS — I700 Atherosclerosis of aorta: Secondary | ICD-10-CM | POA: Diagnosis not present

## 2022-11-18 ENCOUNTER — Telehealth: Payer: Self-pay | Admitting: *Deleted

## 2022-11-18 NOTE — H&P (View-Only) (Signed)
Referring Provider: Olga Coaster, FNP Primary Care Physician:  Olga Coaster, FNP Primary Gastroenterologist:  Dr. Lorayne Bender with [facility]. Now establishing with Dr. Abbey Chatters.  Chief Complaint  Patient presents with   Abdominal Pain    Chronic abdominal pain due to colon surgery and hernia mesh implant.     HPI:   [name] is a 42 y.o. male presenting today at the request of Bucio, [place], FNP for chronic abdominal pain.   He has GI history significant for sigmoid stricture, bowel obstruction, diverticulitis, and perforated cecum in 2020 requiring resection of right colon and resection of sigmoid colon with ileocolonic and colorectal anastomosis.  Developed multiple ventral hernias s/p laparoscopic incisional hernia repair with mesh and lysis of adhesions 01/04/2021.  CT A/P with contrast 06/23/2022 with enlarged liver and spleen, umbilical hernia containing fat. No acute abnormalities.  CT A/P with contrast 11/13/2022 (care everywhere) with no acute abnormalities, surgical changes involving the right colon and sigmoid colon,  Labs 11/13/2022: CMP essentially normal CBC remarkable for WBC 14.2, hemoglobin 17 point  Today: Patient states that his pain management doctor is the one who wanted him to see [place], but he needed to get the referral from his primary care doctor.  Reports he has a lot of abdominal pain chronically since having abdominal surgery.  His chronic abdominal pain in general has not changed at all.  What has changes his bowel function.  He reports new onset of diarrhea a few months ago that has been worsening.  Can have more than 10 loose/watery BMs per day.  Large volume.  Occasionally he will have 1 day with normal stool. No blood or black stool.  He does notice more abdominal cramping when needing to have a bowel movement.  Prior to diarrhea onset, he was having 1-2 solid stools daily.  He has noticed that certain foods like corn and peanuts will worsen his diarrhea.  No trouble  with dairy.  Takes 2 tablets of Imodium once a day as needed which helps sometimes.  He was also recently prescribed Bentyl and plans to pick this up today.  Went to the ER on 1/25 for the same symptoms and had blood work, CT, and was told everything looked fine.   Denies any antibiotics, travel, sick contacts, consumption of well water, hospitalizations prior to diarrhea onset.  Galbladder in situ.   Reports 10 lb weight loss over the last couple of months, but states he also works out a lot and thinks that is contributing to his weight loss.  He is eating well. He is a Adult nurse.   Follows with pain management for his chronic abdominal pain.  Taking Tramadol and gabapentin prn. Doesn't help much with abdominal pain. Some days pain isn't that bad.  In general, his abdominal pain is not really limiting his daily activities.  He is still working and being very active without any significant limitation.  Long history of heartburn is well-controlled on Prilosec.  States this started in his teens.  He has had a few episodes of solid food dysphagia with associated regurgitation, but wants to monitor this for now.     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[place])    Bipolar disorder ([place])    On disability   Depression    Diabetes mellitus without complication ([place])    per patient : under control with diet, does not monitor cbg  at home    GERD (gastroesophageal reflux disease)    Headache(784.0)    daily headaches   Hyperlipidemia    Neuromuscular disorder (HCC)    Beckers muscular dystrophy   Obesity    PTSD (post-traumatic stress disorder)    Sleep apnea    Does not tolerate CPAP    Past Surgical History:  Procedure Laterality Date   APPLICATION OF WOUND VAC N/A 11/23/2018   Procedure: Application Of Wound Vac;  Surgeon: Rodman Pickle, MD;  Location: [facility];  Service: General;  Laterality: N/A;   COLECTOMY N/A 11/23/2018    Procedure: TOTAL COLECTOMY;  Surgeon: Rodman Pickle, MD;  Location: [facility];  Service: General;  Laterality: N/A;   ILEOSTOMY N/A 11/23/2018   Procedure: ILEOSTOMY;  Surgeon: Kinsinger, De Blanch, MD;  Location: [facility];  Service: General;  Laterality: N/A;   ILEOSTOMY CLOSURE N/A 05/19/2019   Procedure: ILEOSTOMY REVERSAL WITH ILEOCOLIC AND COLORECTAL ANASTOMOSIS;  Surgeon: Sheliah Hatch De Blanch, MD;  Location: [facility];  Service: General;  Laterality: N/A;   INCISIONAL HERNIA REPAIR N/A 01/04/2021   Procedure: LAPAROSCOPIC INCISIONAL HERNIA REPAIR WITH MESH; LYSIS OF ADHESIONS;  Surgeon: Rodman Pickle, MD;  Location: [facility];  Service: General;  Laterality: N/A;   IR GUIDED DRAIN W CATHETER PLACEMENT  02/14/2021   IR RADIOLOGIST EVAL & MGMT  02/19/2021   IR RADIOLOGIST EVAL & MGMT  03/21/2021   IR RADIOLOGIST EVAL & MGMT  04/03/2021   IR RADIOLOGIST EVAL & MGMT  04/17/2021   NM MYOCAR PERF WALL MOTION  01/22/2011   protocol: Persantine, moderate reversible inferior defect post stress EF 48%, high risk scan   TONSILLECTOMY     TRANSTHORACIC ECHOCARDIOGRAM  07/05/2004   EF=>55% normal study     Current Outpatient Medications  Medication Sig Dispense Refill   albuterol (VENTOLIN HFA) 108 (90 Base) MCG/ACT inhaler 1 puff as needed Inhalation every 4 hrs for 30 days     atorvastatin (LIPITOR) 40 MG tablet Take 1 tablet (40 mg total) by mouth daily. 90 tablet 3   Blood Glucose Monitoring Suppl (ONE TOUCH ULTRA 2) w/Device KIT Measure blood sugar daily and with meals for insulin 1 kit 0   cyclobenzaprine (FLEXERIL) 10 MG tablet Take 1 tablet (10 mg total) by mouth 2 (two) times daily as needed for muscle spasms. 20 tablet 0   dicyclomine (BENTYL) 20 MG tablet Take by mouth.     escitalopram (LEXAPRO) 10 MG tablet Take 10 mg by mouth daily.     gabapentin (NEURONTIN) 300 MG capsule 1 capsule Orally Once a day for 30 day(s)     glipiZIDE (GLUCOTROL XL) 5 MG 24 hr tablet Take 1 tablet (5 mg  total) by mouth daily with breakfast. (Patient taking differently: Take 5 mg by mouth 2 (two) times daily.) 90 tablet 3   glucose blood (ONETOUCH ULTRA) test strip Use as instructed to monitor glucose 4 times daily 100 each 12   hydrOXYzine (ATARAX) 25 MG tablet Take by mouth.     Lancets (ONETOUCH ULTRASOFT) lancets Use as instructed to monitor glucose 4 times daily 100 each 12   LATUDA 60 MG TABS Take 1 tablet by mouth daily.     loperamide (IMODIUM A-D) 2 MG tablet Take 1 tablet (2 mg total) by mouth 4 (four) times daily as needed for diarrhea or loose stools. 30 tablet 0   LORazepam (ATIVAN) 1 MG tablet Take 0.5-1 mg by mouth daily as needed for anxiety.  MOUNJARO 2.5 MG/0.5ML Pen SMARTSIG:1 pre-filled pen syringe SUB-Q Once a Week     omeprazole (PRILOSEC) 40 MG capsule Take 40 mg by mouth daily.     potassium chloride SA (KLOR-CON M) 20 MEQ tablet Take 20 mEq by mouth daily.     traMADol (ULTRAM) 50 MG tablet Take 50 mg by mouth 2 (two) times daily.     traZODone (DESYREL) 100 MG tablet Take 50-100 mg by mouth at bedtime.     VYVANSE 20 MG capsule Take 20 mg by mouth daily with breakfast. Take with 50 mg for a total of 70 mg     VYVANSE 50 MG capsule Take 50 mg by mouth See admin instructions. Take with 20 mg for a total of 70 mg in the morning     metoCLOPramide (REGLAN) 10 MG tablet Take 1 tablet (10 mg total) by mouth every 6 (six) hours. (Patient not taking: Reported on 11/20/2022) 30 tablet 0   polyethylene glycol-electrolytes (NULYTELY) 420 g solution Take 4,000 mLs by mouth once for 1 dose. 4000 mL 0   No current facility-administered medications for this visit.    Allergies as of 11/20/2022 - Review Complete 11/20/2022  Allergen Reaction Noted   Metformin and related Other (See Comments) 07/08/2016   Penicillins Anaphylaxis, Hives, Shortness Of Breath, and Swelling 10/14/2016   Ibuprofen  10/29/2022   Nsaids Other (See Comments) 11/23/2020    Family History  Problem  Relation Age of Onset   COPD Mother    Depression Mother    Diabetes Mother    Hyperlipidemia Mother    Asthma Father    Arthritis Father    Diabetes Father    Heart disease Father    Hyperlipidemia Father    Hypertension Father    Hypertension Brother    Colon cancer Neg Hx     Social History   Socioeconomic History   Marital status: Married    Spouse name: Not on file   Number of children: 2   Years of education: 12   Highest education level: Not on file  Occupational History    Employer: UNEMPLOYED  Tobacco Use   Smoking status: Every Day    Packs/day: 1.50    Years: 21.00    Total pack years: 31.50    Types: Cigarettes   Smokeless tobacco: Never  Vaping Use   Vaping Use: Former   Devices: Increased nicotine cravings  Substance and Sexual Activity   Alcohol use: No    Alcohol/week: 0.0 standard drinks of alcohol    Comment: quit 2013   Drug use: Not Currently    Types: Marijuana    Comment: 2009 last use   Sexual activity: Yes    Birth control/protection: None  Other Topics Concern   Not on file  Social History Narrative   Patient lives at home with parents, brother and brothers wife.    Patient is single.    Patient has 2 children.    Patient has a high school education.    Patient is unemployed.    Patient is right handed.    Social Determinants of Health   Financial Resource Strain: Not on file  Food Insecurity: Not on file  Transportation Needs: No Transportation Needs (08/27/2022)   PRAPARE - Hydrologist (Medical): No    Lack of Transportation (Non-Medical): No  Physical Activity: Not on file  Stress: Not on file  Social Connections: Not on file  Intimate Partner Violence: Not  on file    Review of Systems: Gen: Denies any fever, chills, cold or flulike symptoms, presyncope, syncope. CV: Denies chest pain, heart palpitations. Resp: Denies shortness of breath, cough. GI: See HPI GU : Denies urinary burning,  urinary frequency, urinary hesitancy MS: Denies joint pain. Derm: Denies rash. Psych: Admits to history of anxiety/depression, but controlled with medications. Heme: See HPI  Physical Exam: BP 130/88 (BP Location: Right Arm, Patient Position: Sitting, Cuff Size: Large)   Pulse 80   Temp 98.6 F (37 C) (Oral)   Ht $R'5\' 7"'NZ$  (1.702 m)   Wt 232 lb 6.4 oz (105.4 kg)   SpO2 98%   BMI 36.40 kg/m  General:   Alert and oriented. Pleasant and cooperative. Well-nourished and well-developed.  Head:  Normocephalic and atraumatic. Eyes:  Without icterus, sclera clear and conjunctiva pink.  Ears:  Normal auditory acuity. Lungs:  Clear to auscultation bilaterally. No wheezes, rales, or rhonchi. No distress.  Heart:  S1, S2 present without murmurs appreciated.  Abdomen:  +BS, soft, and non-distended. Mild generalized tenderness to palpation. No HSM noted. No guarding or rebound. No masses appreciated. Scaring noted in midline, RUQ, and RLQ.  Rectal:  Deferred  Msk:  Symmetrical without gross deformities. Normal posture. Extremities:  Without edema. Neurologic:  Alert and  oriented x4;  grossly normal neurologically. Skin:  Intact without significant lesions or rashes. Psych:  Normal mood and affect.    Assessment:  42 year old male with history of anxiety, depression, bipolar disorder, muscular dystrophy, diabetes, HLD, GERD, and resection of right colon and sigmoid colon with ileocolonic and colorectal anastomosis due to sigmoid stricture, bowel obstruction, diverticulitis, and perforated cecum in 2022.  This was followed by development of multiple ventral hernias s/p laparoscopic hernia repair with mesh and lysis of adhesions in March 2022.  He is presenting today at the request of Rubin Payor, FNP as well as pain management for chronic abdominal pain. Patient's chief complaint is change in bowel habits with diarrhea.   Diarrhea:  Acute change in bowel habits a few months ago, now with more than 10  loose/watery large bowel movements per day. Occasionally with have 1 day with regular stool. Denies BRBPR or melena.  Reports prior to this, he was having 1-2 solid stools daily.  Recent labs on file 11/13/2022 with electrolytes, kidney function within normal limits.  He did have leukocytosis of 14.2.  Symptoms are concerning for infectious diarrhea and we will rule this out.  We will also check thyroid function and screen for celiac disease.  He is also in need of a colonoscopy due to his history of diverticulitis and this will also help to evaluate his diarrhea if stool testing/blood work is negative and he has persistent symptoms.   History of diverticulitis: Diverticulitis in 2022.  This was in the setting of sigmoid stricture, bowel obstruction, and also ultimately developed perforated cecum as above requiring resection of right colon and sigmoid colon with ileocolonic and colorectal anastomosis. Reviewed pathology, no malignancy identified. He needs first-ever colonoscopy to follow-up on prior diverticulitis.  Chronic abdominal pain: Secondary to extensive abdominal surgery. Likely has adhesions. Recent CT A/P with contrast 11/13/22 with no acute pathology. I do not have much to offer him at this time. Encouragingly, he doesn't really have any functional limitation due to his pain. He is still working as a Adult nurse and states he is very active and works out frequently.  He is currently following with pain management, and I have encouraged him  to continue to do so. Could consider referral back to general surgery for consideration of lysis of adhesions if worsening symptoms.   Dysphagia: Few episodes of solid food dysphagia with associated regurgitation.  This is in the setting of chronic GERD, currently well-controlled on daily PPI.  He has lost some weight, but attributes this to being active.  Offered EGD and barium pill esophagram for further evaluation, but patient prefers to hold off on  this for now.   Plan:  Stool testing (C. difficile, GI panel, Giardia, Cryptosporidium), TSH, IgA, TTG IgA Proceed with colonoscopy with propofol by Dr. Abbey Chatters in near future. The risks, benefits, and alternatives have been discussed with the patient in detail. The patient states understanding and desires to proceed.  ASA 3 Separate instructions provided for diabetes medication adjustments. Continue to follow with pain management for chronic abdominal pain. Monitor for worsening dysphagia.  Patient will let us know if he would like to pursue further evaluation. Follow-up after colonoscopy.   Aliene Altes, PA-C [facility] 11/20/2022

## 2022-11-18 NOTE — Progress Notes (Unsigned)
Referring Provider: Shelby Dubin, FNP Primary Care Physician:  Shelby Dubin, FNP Primary Gastroenterologist:  Dr. Elberta Leatherwood with [facility]. Now establishing with Dr. Marletta Lor.  No chief complaint on file.   HPI:   [name] is a 42 y.o. male presenting today at the request of Bucio, Elsa C, FNP for chronic abdominal pain.   He has GI history significant for sigmoid stricture, bowel obstruction, diverticulitis, and perforated colon in 2020 requiring resection of right colon and resection of sigmoid colon with ileocolonic and colorectal anastomosis.   CT A/P with contrast 06/23/2022 with enlarged liver and spleen.  No acute abnormalities. CT A/P with contrast 11/13/2022 with no acute abnormalities, surgical changes involving the right colon and sigmoid colon. Labs 11/13/2022: CMP essentially normal CBC remarkable for WBC 14.2, hemoglobin 17 point  Today:  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HCC)    Bipolar disorder (HCC)    On disability   Depression    Diabetes mellitus without complication (HCC)    per patient : under control with diet, does not monitor cbg at home    GERD (gastroesophageal reflux disease)    Headache(784.0)    daily headaches   Hyperlipidemia    Neuromuscular disorder (HCC)    Beckers muscular dystrophy   Obesity    PTSD (post-traumatic stress disorder)    Sleep apnea    Does not tolerate CPAP    Past Surgical History:  Procedure Laterality Date   APPLICATION OF WOUND VAC N/A 11/23/2018   Procedure: Application Of Wound Vac;  Surgeon: Kinsinger, De Blanch, MD;  Location: [facility];  Service: General;  Laterality: N/A;   COLECTOMY N/A 11/23/2018   Procedure: TOTAL COLECTOMY;  Surgeon: Rodman Pickle, MD;  Location: [facility];  Service: General;  Laterality: N/A;   ILEOSTOMY N/A 11/23/2018   Procedure: ILEOSTOMY;  Surgeon: Kinsinger, De Blanch, MD;  Location: [facility];  Service: General;  Laterality: N/A;    ILEOSTOMY CLOSURE N/A 05/19/2019   Procedure: ILEOSTOMY REVERSAL WITH ILEOCOLIC AND COLORECTAL ANASTOMOSIS;  Surgeon: Sheliah Hatch, De Blanch, MD;  Location: [facility];  Service: General;  Laterality: N/A;   INCISIONAL HERNIA REPAIR N/A 01/04/2021   Procedure: LAPAROSCOPIC INCISIONAL HERNIA REPAIR WITH MESH; LYSIS OF ADHESIONS;  Surgeon: Rodman Pickle, MD;  Location: [facility];  Service: General;  Laterality: N/A;   IR GUIDED DRAIN W CATHETER PLACEMENT  02/14/2021   IR RADIOLOGIST EVAL & MGMT  02/19/2021   IR RADIOLOGIST EVAL & MGMT  03/21/2021   IR RADIOLOGIST EVAL & MGMT  04/03/2021   IR RADIOLOGIST EVAL & MGMT  04/17/2021   NM MYOCAR PERF WALL MOTION  01/22/2011   protocol: Persantine, moderate reversible inferior defect post stress EF 48%, high risk scan   TONSILLECTOMY     TRANSTHORACIC ECHOCARDIOGRAM  07/05/2004   EF=>55% normal study     Current Outpatient Medications  Medication Sig Dispense Refill   atorvastatin (LIPITOR) 40 MG tablet Take 1 tablet (40 mg total) by mouth daily. 90 tablet 3   Blood Glucose Monitoring Suppl (ONE TOUCH ULTRA 2) w/Device KIT Measure blood sugar daily and with meals for insulin 1 kit 0   cloNIDine HCl (KAPVAY) 0.1 MG TB12 ER tablet Take 0.1 mg by mouth daily. (Patient not taking: Reported on 02/06/2022)     cyclobenzaprine (FLEXERIL) 10 MG tablet Take 1 tablet (10 mg total) by mouth 2 (two) times daily as needed  for muscle spasms. 20 tablet 0   FLUoxetine (PROZAC) 40 MG capsule Take 40 mg by mouth at bedtime.     glipiZIDE (GLUCOTROL XL) 5 MG 24 hr tablet Take 1 tablet (5 mg total) by mouth daily with breakfast. 90 tablet 3   glucose blood (ONETOUCH ULTRA) test strip Use as instructed to monitor glucose 4 times daily 100 each 12   Ibuprofen-diphenhydrAMINE Cit (ADVIL PM PO) Take 1 tablet by mouth at bedtime as needed (sleep).     insulin glargine (LANTUS) 100 UNIT/ML injection Inject 0.4 mLs (40 Units total) into the skin at bedtime. 30 mL 3   insulin lispro  (HUMALOG) 100 UNIT/ML injection Inject 0.05-0.11 mLs (5-11 Units total) into the skin 3 (three) times daily with meals. 30 mL 3   Insulin Pen Needle 32G X 6 MM MISC 1 packet by Does not apply route 2 (two) times daily. 1 each 4   INSULIN SYRINGE .5CC/29G 29G X 1/2" 0.5 ML MISC Use to inject insulin 4 times daily 100 each 3   Lancets (ONETOUCH ULTRASOFT) lancets Use as instructed to monitor glucose 4 times daily 100 each 12   LATUDA 60 MG TABS Take 1 tablet by mouth daily.     lidocaine (LIDODERM) 5 % Place 1 patch onto the skin daily. Remove & Discard patch within 12 hours or as directed by MD 7 patch 0   loperamide (IMODIUM A-D) 2 MG tablet Take 1 tablet (2 mg total) by mouth 4 (four) times daily as needed for diarrhea or loose stools. 30 tablet 0   LORazepam (ATIVAN) 1 MG tablet Take 0.5-1 mg by mouth daily as needed for anxiety.     metoCLOPramide (REGLAN) 10 MG tablet Take 1 tablet (10 mg total) by mouth every 6 (six) hours. 30 tablet 0   omeprazole (PRILOSEC) 20 MG capsule TAKE 2 CAPSULES (40 MG TOTAL) BY MOUTH AT BEDTIME. (Patient taking differently: Take 40 mg by mouth daily.) 180 capsule 0   potassium chloride SA (KLOR-CON M) 20 MEQ tablet Take 20 mEq by mouth daily. (Patient not taking: Reported on 02/06/2022)     propranolol (INDERAL) 20 MG tablet SMARTSIG:0.5-1 Tablet(s) By Mouth 2-3 Times Daily PRN     traMADol (ULTRAM) 50 MG tablet Take 50 mg by mouth 2 (two) times daily.     traZODone (DESYREL) 100 MG tablet Take 50-100 mg by mouth at bedtime.     VYVANSE 20 MG capsule Take 20 mg by mouth daily with breakfast. Take with 50 mg for a total of 70 mg     VYVANSE 50 MG capsule Take 50 mg by mouth See admin instructions. Take with 20 mg for a total of 70 mg in the morning     No current facility-administered medications for this visit.    Allergies as of 11/20/2022 - Review Complete 08/27/2022  Allergen Reaction Noted   Metformin and related Other (See Comments) 07/08/2016   Penicillins  Anaphylaxis, Hives, Shortness Of Breath, and Swelling 10/14/2016   Nsaids Other (See Comments) 11/23/2020    Family History  Problem Relation Age of Onset   COPD Mother    Depression Mother    Diabetes Mother    Hyperlipidemia Mother    Asthma Father    Arthritis Father    Diabetes Father    Heart disease Father    Hyperlipidemia Father    Hypertension Father    Hypertension Brother     Social History   Socioeconomic History   Marital  status: Married    Spouse name: Not on file   Number of children: 2   Years of education: 7   Highest education level: Not on file  Occupational History    Employer: UNEMPLOYED  Tobacco Use   Smoking status: Every Day    Packs/day: 1.50    Years: 21.00    Total pack years: 31.50    Types: Cigarettes   Smokeless tobacco: Never  Vaping Use   Vaping Use: Former   Devices: Increased nicotine cravings  Substance and Sexual Activity   Alcohol use: No    Alcohol/week: 0.0 standard drinks of alcohol    Comment: quit 2013   Drug use: Not Currently    Types: Marijuana    Comment: 2009 last use   Sexual activity: Yes    Birth control/protection: None  Other Topics Concern   Not on file  Social History Narrative   Patient lives at home with parents, brother and brothers wife.    Patient is single.    Patient has 2 children.    Patient has a high school education.    Patient is unemployed.    Patient is right handed.    Social Determinants of Health   Financial Resource Strain: Not on file  Food Insecurity: Not on file  Transportation Needs: No Transportation Needs (08/27/2022)   PRAPARE - Administrator, Civil Service (Medical): No    Lack of Transportation (Non-Medical): No  Physical Activity: Not on file  Stress: Not on file  Social Connections: Not on file  Intimate Partner Violence: Not on file    Review of Systems: Gen: Denies any fever, chills, fatigue, weight loss, lack of appetite.  CV: Denies chest pain,  heart palpitations, peripheral edema, syncope.  Resp: Denies shortness of breath at rest or with exertion. Denies wheezing or cough.  GI: Denies dysphagia or odynophagia. Denies jaundice, hematemesis, fecal incontinence. GU : Denies urinary burning, urinary frequency, urinary hesitancy MS: Denies joint pain, muscle weakness, cramps, or limitation of movement.  Derm: Denies rash, itching, dry skin Psych: Denies depression, anxiety, memory loss, and confusion Heme: Denies bruising, bleeding, and enlarged lymph nodes.  Physical Exam: There were no vitals taken for this visit. General:   Alert and oriented. Pleasant and cooperative. Well-nourished and well-developed.  Head:  Normocephalic and atraumatic. Eyes:  Without icterus, sclera clear and conjunctiva pink.  Ears:  Normal auditory acuity. Lungs:  Clear to auscultation bilaterally. No wheezes, rales, or rhonchi. No distress.  Heart:  S1, S2 present without murmurs appreciated.  Abdomen:  +BS, soft, non-tender and non-distended. No HSM noted. No guarding or rebound. No masses appreciated.  Rectal:  Deferred  Msk:  Symmetrical without gross deformities. Normal posture. Extremities:  Without edema. Neurologic:  Alert and  oriented x4;  grossly normal neurologically. Skin:  Intact without significant lesions or rashes. Psych:  Alert and cooperative. Normal mood and affect.    Assessment:     Plan:  ***   Ermalinda Memos, PA-C [facility] 11/20/2022

## 2022-11-18 NOTE — Telephone Encounter (Signed)
[facility] called asking if we had received fax from them for the medications Lantus , Humalog Quick Pen to be refilled.  I called them to share that we had not , and they stated that they would resend it. When reviewing the patient's chart, he has not been seen here since 12/09/2021. The appointments after this, he was a no show.  I let this pharmacy know that this request would not be done. Patient will need a office appointment.

## 2022-11-20 ENCOUNTER — Encounter: Payer: Self-pay | Admitting: *Deleted

## 2022-11-20 ENCOUNTER — Encounter: Payer: Self-pay | Admitting: Gastroenterology

## 2022-11-20 ENCOUNTER — Other Ambulatory Visit: Payer: Self-pay | Admitting: *Deleted

## 2022-11-20 ENCOUNTER — Telehealth: Payer: Self-pay | Admitting: *Deleted

## 2022-11-20 ENCOUNTER — Ambulatory Visit (INDEPENDENT_AMBULATORY_CARE_PROVIDER_SITE_OTHER): Payer: 59 | Admitting: Gastroenterology

## 2022-11-20 VITALS — BP 130/88 | HR 80 | Temp 98.6°F | Ht 67.0 in | Wt 232.4 lb

## 2022-11-20 DIAGNOSIS — R1310 Dysphagia, unspecified: Secondary | ICD-10-CM | POA: Diagnosis not present

## 2022-11-20 DIAGNOSIS — R197 Diarrhea, unspecified: Secondary | ICD-10-CM

## 2022-11-20 DIAGNOSIS — G8929 Other chronic pain: Secondary | ICD-10-CM | POA: Diagnosis not present

## 2022-11-20 DIAGNOSIS — R109 Unspecified abdominal pain: Secondary | ICD-10-CM

## 2022-11-20 MED ORDER — PEG 3350-KCL-NA BICARB-NACL 420 G PO SOLR: 4000.0000 mL | Freq: Once | ORAL | 0 refills | Status: AC

## 2022-11-20 NOTE — Patient Instructions (Addendum)
Have blood work and stool studies completed at [facility].    I will have further recommendations for you regarding medication management of diarrhea following stool study results.  We will schedule you for colonoscopy with Dr. Abbey Chatters in the near future. You will need to hold [facility] for 7 days before your colonoscopy. 1 day prior to your colonoscopy: Hold your evening dose of glipizide. Day of your colonoscopy: Do not take any morning diabetes medications.  Your chronic abdominal pain is secondary to your multiple abdominal surgeries.  I recommend that you continue to follow with pain management.   We will follow-up with you in the office after your colonoscopy.  It was very nice to meet you today!  Aliene Altes, PA-C [facility]

## 2022-11-20 NOTE — Telephone Encounter (Signed)
[employer] PA: APPROVED Authorization #: [id number]  DOS: 12/01/22-03/01/23

## 2022-11-21 DIAGNOSIS — M25561 Pain in right knee: Secondary | ICD-10-CM | POA: Diagnosis not present

## 2022-11-21 DIAGNOSIS — G8929 Other chronic pain: Secondary | ICD-10-CM | POA: Diagnosis not present

## 2022-11-21 DIAGNOSIS — F32A Depression, unspecified: Secondary | ICD-10-CM | POA: Diagnosis not present

## 2022-11-21 DIAGNOSIS — R109 Unspecified abdominal pain: Secondary | ICD-10-CM | POA: Diagnosis not present

## 2022-11-21 DIAGNOSIS — Z79899 Other long term (current) drug therapy: Secondary | ICD-10-CM | POA: Diagnosis not present

## 2022-11-21 DIAGNOSIS — G894 Chronic pain syndrome: Secondary | ICD-10-CM | POA: Diagnosis not present

## 2022-11-25 DIAGNOSIS — Z79899 Other long term (current) drug therapy: Secondary | ICD-10-CM | POA: Diagnosis not present

## 2022-11-27 ENCOUNTER — Encounter (HOSPITAL_COMMUNITY): Payer: Self-pay

## 2022-11-27 ENCOUNTER — Other Ambulatory Visit: Payer: Self-pay

## 2022-11-28 ENCOUNTER — Encounter (HOSPITAL_COMMUNITY): Payer: Self-pay | Admitting: *Deleted

## 2022-11-28 ENCOUNTER — Encounter (HOSPITAL_COMMUNITY)
Admission: RE | Admit: 2022-11-28 | Discharge: 2022-11-28 | Disposition: A | Discharge status: Home / Self Care | Payer: 59 | Source: Ambulatory Visit | Attending: Internal Medicine | Admitting: Internal Medicine

## 2022-11-28 ENCOUNTER — Other Ambulatory Visit: Payer: Self-pay

## 2022-11-28 ENCOUNTER — Emergency Department (HOSPITAL_COMMUNITY)
Admission: EM | Admit: 2022-11-28 | Discharge: 2022-11-28 | Discharge status: Against Medical Advice | Payer: 59 | Attending: Emergency Medicine | Admitting: Emergency Medicine

## 2022-11-28 DIAGNOSIS — R109 Unspecified abdominal pain: Secondary | ICD-10-CM | POA: Diagnosis not present

## 2022-11-28 DIAGNOSIS — Z5321 Procedure and treatment not carried out due to patient leaving prior to being seen by health care provider: Secondary | ICD-10-CM | POA: Insufficient documentation

## 2022-11-28 NOTE — ED Triage Notes (Signed)
Pt with abd pain x 2 days, worse today.  Denies any N/V/D.  Scheduled to have colonoscopy on Monday 2/12.

## 2022-11-28 NOTE — ED Notes (Signed)
Pt states he is leaving due to long wait times, encouraged pt to stay to see provider, pt not willing to stay. ED-PA made aware. Pt ambulatory off unit and understands he can come back to ED for treatment.

## 2022-12-01 ENCOUNTER — Ambulatory Visit (HOSPITAL_COMMUNITY): Payer: 59 | Admitting: Certified Registered Nurse Anesthetist

## 2022-12-01 ENCOUNTER — Ambulatory Visit (HOSPITAL_BASED_OUTPATIENT_CLINIC_OR_DEPARTMENT_OTHER): Payer: 59 | Admitting: Certified Registered Nurse Anesthetist

## 2022-12-01 ENCOUNTER — Encounter (HOSPITAL_COMMUNITY): Payer: Self-pay

## 2022-12-01 ENCOUNTER — Ambulatory Visit (HOSPITAL_COMMUNITY)
Admission: RE | Admit: 2022-12-01 | Discharge: 2022-12-01 | Disposition: A | Discharge status: Home / Self Care | Payer: 59 | Attending: Internal Medicine | Admitting: Internal Medicine

## 2022-12-01 ENCOUNTER — Encounter (HOSPITAL_COMMUNITY)
Admission: RE | Disposition: A | Discharge status: Home / Self Care | Payer: Self-pay | Source: Home / Self Care | Attending: Internal Medicine

## 2022-12-01 DIAGNOSIS — D124 Benign neoplasm of descending colon: Secondary | ICD-10-CM | POA: Diagnosis not present

## 2022-12-01 DIAGNOSIS — G8929 Other chronic pain: Secondary | ICD-10-CM | POA: Insufficient documentation

## 2022-12-01 DIAGNOSIS — R1110 Vomiting, unspecified: Secondary | ICD-10-CM | POA: Diagnosis not present

## 2022-12-01 DIAGNOSIS — Z8719 Personal history of other diseases of the digestive system: Secondary | ICD-10-CM | POA: Diagnosis not present

## 2022-12-01 DIAGNOSIS — R1030 Lower abdominal pain, unspecified: Secondary | ICD-10-CM | POA: Diagnosis not present

## 2022-12-01 DIAGNOSIS — Z79899 Other long term (current) drug therapy: Secondary | ICD-10-CM | POA: Diagnosis not present

## 2022-12-01 DIAGNOSIS — F319 Bipolar disorder, unspecified: Secondary | ICD-10-CM | POA: Diagnosis not present

## 2022-12-01 DIAGNOSIS — Z9049 Acquired absence of other specified parts of digestive tract: Secondary | ICD-10-CM | POA: Diagnosis not present

## 2022-12-01 DIAGNOSIS — K635 Polyp of colon: Secondary | ICD-10-CM | POA: Insufficient documentation

## 2022-12-01 DIAGNOSIS — K219 Gastro-esophageal reflux disease without esophagitis: Secondary | ICD-10-CM | POA: Insufficient documentation

## 2022-12-01 DIAGNOSIS — K648 Other hemorrhoids: Secondary | ICD-10-CM | POA: Insufficient documentation

## 2022-12-01 DIAGNOSIS — D125 Benign neoplasm of sigmoid colon: Secondary | ICD-10-CM

## 2022-12-01 DIAGNOSIS — E119 Type 2 diabetes mellitus without complications: Secondary | ICD-10-CM | POA: Diagnosis not present

## 2022-12-01 DIAGNOSIS — Z980 Intestinal bypass and anastomosis status: Secondary | ICD-10-CM | POA: Insufficient documentation

## 2022-12-01 DIAGNOSIS — F17210 Nicotine dependence, cigarettes, uncomplicated: Secondary | ICD-10-CM | POA: Insufficient documentation

## 2022-12-01 DIAGNOSIS — Z7985 Long-term (current) use of injectable non-insulin antidiabetic drugs: Secondary | ICD-10-CM | POA: Insufficient documentation

## 2022-12-01 DIAGNOSIS — G4730 Sleep apnea, unspecified: Secondary | ICD-10-CM | POA: Insufficient documentation

## 2022-12-01 DIAGNOSIS — K529 Noninfective gastroenteritis and colitis, unspecified: Secondary | ICD-10-CM | POA: Insufficient documentation

## 2022-12-01 DIAGNOSIS — F419 Anxiety disorder, unspecified: Secondary | ICD-10-CM | POA: Insufficient documentation

## 2022-12-01 DIAGNOSIS — R1310 Dysphagia, unspecified: Secondary | ICD-10-CM | POA: Insufficient documentation

## 2022-12-01 HISTORY — PX: POLYPECTOMY: SHX5525

## 2022-12-01 HISTORY — PX: COLONOSCOPY WITH PROPOFOL: SHX5780

## 2022-12-01 HISTORY — PX: BIOPSY: SHX5522

## 2022-12-01 LAB — GLUCOSE, CAPILLARY: Glucose-Capillary: 259 mg/dL — ABNORMAL HIGH (ref 70–99)

## 2022-12-01 SURGERY — COLONOSCOPY WITH PROPOFOL
Anesthesia: General

## 2022-12-01 MED ORDER — LACTATED RINGERS IV SOLN: INTRAVENOUS | Status: DC

## 2022-12-01 MED ORDER — PROPOFOL 10 MG/ML IV BOLUS
INTRAVENOUS | Status: DC | PRN
  Administered 2022-12-01: 50 mg via INTRAVENOUS
  Administered 2022-12-01: 40 mg via INTRAVENOUS
  Administered 2022-12-01: 50 mg via INTRAVENOUS
  Administered 2022-12-01: 30 mg via INTRAVENOUS
  Administered 2022-12-01 (×2): 50 mg via INTRAVENOUS
  Administered 2022-12-01: 110 mg via INTRAVENOUS

## 2022-12-01 MED ORDER — STERILE WATER FOR IRRIGATION IR SOLN
Status: DC | PRN
  Administered 2022-12-01: 60 mL

## 2022-12-01 MED ORDER — LIDOCAINE HCL (CARDIAC) PF 100 MG/5ML IV SOSY
PREFILLED_SYRINGE | INTRAVENOUS | Status: DC | PRN
  Administered 2022-12-01: 50 mg via INTRAVENOUS

## 2022-12-01 NOTE — Discharge Instructions (Addendum)
  Colonoscopy Discharge Instructions  Read the instructions outlined below and refer to this sheet in the next few weeks. These discharge instructions provide you with general information on caring for yourself after you leave the hospital. Your doctor may also give you specific instructions. While your treatment has been planned according to the most current medical practices available, unavoidable complications occasionally occur.   ACTIVITY You may resume your regular activity, but move at a slower pace for the next 24 hours.  Take frequent rest periods for the next 24 hours.  Walking will help get rid of the air and reduce the bloated feeling in your belly (abdomen).  No driving for 24 hours (because of the medicine (anesthesia) used during the test).   Do not sign any important legal documents or operate any machinery for 24 hours (because of the anesthesia used during the test).  NUTRITION Drink plenty of fluids.  You may resume your normal diet as instructed by your doctor.  Begin with a light meal and progress to your normal diet. Heavy or fried foods are harder to digest and may make you feel sick to your stomach (nauseated).  Avoid alcoholic beverages for 24 hours or as instructed.  MEDICATIONS You may resume your normal medications unless your doctor tells you otherwise.  WHAT YOU CAN EXPECT TODAY Some feelings of bloating in the abdomen.  Passage of more gas than usual.  Spotting of blood in your stool or on the toilet paper.  IF YOU HAD POLYPS REMOVED DURING THE COLONOSCOPY: No aspirin products for 7 days or as instructed.  No alcohol for 7 days or as instructed.  Eat a soft diet for the next 24 hours.  FINDING OUT THE RESULTS OF YOUR TEST Not all test results are available during your visit. If your test results are not back during the visit, make an appointment with your caregiver to find out the results. Do not assume everything is normal if you have not heard from your  caregiver or the medical facility. It is important for you to follow up on all of your test results.  SEEK IMMEDIATE MEDICAL ATTENTION IF: You have more than a spotting of blood in your stool.  Your belly is swollen (abdominal distention).  You are nauseated or vomiting.  You have a temperature over 101.  You have abdominal pain or discomfort that is severe or gets worse throughout the day.   Overall your colon looked healthy.  I did not see any active inflammation throughout your entire colon.  I did take biopsies though we will call with these results.  Previous surgical sites looked healthy.  Your colonoscopy revealed 2 polyp(s) which I removed successfully. Await pathology results, my office will contact you. I recommend repeating colonoscopy in 7 years for surveillance purposes.   Follow up in 2-3 months.   I hope you have a great rest of your week!  Elon Alas. Abbey Chatters, D.O. Gastroenterology and Hepatology [facility]

## 2022-12-01 NOTE — Transfer of Care (Signed)
Immediate Anesthesia Transfer of Care Note  Patient: [name]  Procedure(s) Performed: COLONOSCOPY WITH PROPOFOL BIOPSY POLYPECTOMY  Patient Location: Short Stay  Anesthesia Type:General  Level of Consciousness: awake  Airway & Oxygen Therapy: Patient Spontanous Breathing  Post-op Assessment: Report given to RN and Post -op Vital signs reviewed and stable  Post vital signs: Reviewed and stable  Last Vitals:  Vitals Value Taken Time  BP 114/72 12/01/22 1053  Temp 37 C 12/01/22 1053  Pulse 100 12/01/22 1053  Resp 15 12/01/22 1053  SpO2 96 % 12/01/22 1053    Last Pain:  Vitals:   12/01/22 1053  TempSrc: Oral  PainSc: 0-No pain         Complications: No notable events documented.

## 2022-12-01 NOTE — Anesthesia Preprocedure Evaluation (Signed)
Anesthesia Evaluation  Patient identified by MRN, date of birth, ID band Patient awake    Reviewed: Allergy & Precautions, H&P , NPO status , Patient's Chart, lab work & pertinent test results, reviewed documented beta blocker date and time   Airway Mallampati: II  TM Distance: >3 FB Neck ROM: full    Dental no notable dental hx.    Pulmonary neg pulmonary ROS, sleep apnea , Current Smoker   Pulmonary exam normal breath sounds clear to auscultation       Cardiovascular Exercise Tolerance: Good negative cardio ROS  Rhythm:regular Rate:Normal     Neuro/Psych  Headaches PSYCHIATRIC DISORDERS Anxiety Depression Bipolar Disorder    Neuromuscular disease negative neurological ROS  negative psych ROS   GI/Hepatic negative GI ROS, Neg liver ROS,GERD  ,,  Endo/Other  negative endocrine ROSdiabetes    Renal/GU negative Renal ROS  negative genitourinary   Musculoskeletal   Abdominal   Peds  Hematology negative hematology ROS (+)   Anesthesia Other Findings   Reproductive/Obstetrics negative OB ROS                             Anesthesia Physical Anesthesia Plan  ASA: 2  Anesthesia Plan: General   Post-op Pain Management:    Induction:   PONV Risk Score and Plan: Propofol infusion  Airway Management Planned:   Additional Equipment:   Intra-op Plan:   Post-operative Plan:   Informed Consent: I have reviewed the patients History and Physical, chart, labs and discussed the procedure including the risks, benefits and alternatives for the proposed anesthesia with the patient or authorized representative who has indicated his/her understanding and acceptance.     Dental Advisory Given  Plan Discussed with: CRNA  Anesthesia Plan Comments:        Anesthesia Quick Evaluation

## 2022-12-01 NOTE — Op Note (Signed)
[facility] Patient Name: [name] Procedure Date: 12/01/2022 10:24 AM MRN: [id number] Date of Birth: [birth year] Attending MD: Elon Alas. Abbey Chatters , [place], [id number] CSN: [id number] Age: 42 Admit Type: Outpatient Procedure:                Colonoscopy Indications:              Lower abdominal pain, Chronic diarrhea Providers:                Elon Alas. Abbey Chatters, DO, Caprice Kluver, Aram Candela Referring MD:              Medicines:                See the Anesthesia note for documentation of the                            administered medications Complications:            No immediate complications. Estimated Blood Loss:     Estimated blood loss was minimal. Procedure:                Pre-Anesthesia Assessment:                           - The anesthesia plan was to use monitored                            anesthesia care (MAC).                           After obtaining informed consent, the colonoscope                            was passed under direct vision. Throughout the                            procedure, the patient's blood pressure, pulse, and                            oxygen saturations were monitored continuously. The                            PCF-HQ190L ([id number]) scope was introduced through                            the anus and advanced to the the ileocolonic                            anastomosis. The colonoscopy was performed without                            difficulty. The patient tolerated the procedure                            well. The quality of the bowel preparation was                            evaluated  using the BBPS [facility] Bowel Preparation                            Scale) with scores of: Right Colon = 2 (minor                            amount of residual staining, small fragments of                            stool and/or opaque liquid, but mucosa seen well),                            Transverse Colon = 3 (entire mucosa seen well with                             no residual staining, small fragments of stool or                            opaque liquid) and Left Colon = 2 (minor amount of                            residual staining, small fragments of stool and/or                            opaque liquid, but mucosa seen well). The total                            BBPS score equals 7. The quality of the bowel                            preparation was good. Scope In: 10:34:01 AM Scope Out: 10:51:14 AM Scope Withdrawal Time: 0 hours 16 minutes 19 seconds  Total Procedure Duration: 0 hours 17 minutes 13 seconds  Findings:      The perianal and digital rectal examinations were normal.      Non-bleeding internal hemorrhoids were found during endoscopy.      There was evidence of a prior side-to-side colo-colonic anastomosis in       the sigmoid colon. This was patent and was characterized by healthy       appearing mucosa. The anastomosis was traversed.      There was evidence of a prior side-to-side ileo-colonic anastomosis in       the transverse colon. This was patent and was characterized by healthy       appearing mucosa. The anastomosis was traversed.      A 3 mm polyp was found in the descending colon. The polyp was sessile.       The polyp was removed with a cold snare. Resection and retrieval were       complete.      A 7 mm polyp was found in the sigmoid colon. The polyp was sessile. The       polyp was removed with a cold snare. Resection and retrieval were       complete.      Biopsies were taken with a cold forceps in the descending  colon and in       the transverse colon for histology.      The exam was otherwise without abnormality. Impression:               - Non-bleeding internal hemorrhoids.                           - Patent side-to-side colo-colonic anastomosis,                            characterized by healthy appearing mucosa.                           - Patent side-to-side ileo-colonic anastomosis,                             characterized by healthy appearing mucosa.                           - One 3 mm polyp in the descending colon, removed                            with a cold snare. Resected and retrieved.                           - One 7 mm polyp in the sigmoid colon, removed with                            a cold snare. Resected and retrieved.                           - The examination was otherwise normal.                           - Biopsies were taken with a cold forceps for                            histology in the descending colon and in the                            transverse colon. Moderate Sedation:      Per Anesthesia Care Recommendation:           - Patient has a contact number available for                            emergencies. The signs and symptoms of potential                            delayed complications were discussed with the                            patient. Return to normal activities tomorrow.                            Written discharge  instructions were provided to the                            patient.                           - Resume previous diet.                           - Continue present medications.                           - Await pathology results.                           - Repeat colonoscopy in 7-10 years for surveillance.                           - Return to GI clinic in 3 months. Procedure Code(s):        --- Professional ---                           [phone], Colonoscopy, flexible; with removal of                            tumor(s), polyp(s), or other lesion(s) by snare                            technique                           45380, 57, Colonoscopy, flexible; with biopsy,                            single or multiple Diagnosis Code(s):        --- Professional ---                           Z98.0, Intestinal bypass and anastomosis status                           D12.4, Benign neoplasm of descending colon                            D12.5, Benign neoplasm of sigmoid colon                           K64.8, Other hemorrhoids                           R10.30, Lower abdominal pain, unspecified                           K52.9, Noninfective gastroenteritis and colitis,                            unspecified CPT copyright 2022 American Medical Association. All rights reserved. The codes documented in  this report are preliminary and upon coder review may  be revised to meet current compliance requirements. Elon Alas. Abbey Chatters, DO Blaine Abbey Chatters, DO 12/01/2022 10:55:38 AM This report has been signed electronically. Number of Addenda: 0

## 2022-12-01 NOTE — Interval H&P Note (Signed)
History and Physical Interval Note:  12/01/2022 10:00 AM  [name]  has presented today for surgery, with the diagnosis of chronic abdominal pain,diarrhea.  The various methods of treatment have been discussed with the patient and family. After consideration of risks, benefits and other options for treatment, the patient has consented to  Procedure(s) with comments: COLONOSCOPY WITH PROPOFOL (N/A) - 10:45 am as a surgical intervention.  The patient's history has been reviewed, patient examined, no change in status, stable for surgery.  I have reviewed the patient's chart and labs.  Questions were answered to the patient's satisfaction.     [name]

## 2022-12-02 NOTE — Anesthesia Postprocedure Evaluation (Signed)
Anesthesia Post Note  Patient: [name]  Procedure(s) Performed: COLONOSCOPY WITH PROPOFOL BIOPSY POLYPECTOMY  Patient location during evaluation: Phase II Anesthesia Type: General Level of consciousness: awake Pain management: pain level controlled Vital Signs Assessment: post-procedure vital signs reviewed and stable Respiratory status: spontaneous breathing and respiratory function stable Cardiovascular status: blood pressure returned to baseline and stable Postop Assessment: no headache and no apparent nausea or vomiting Anesthetic complications: no Comments: Late entry   No notable events documented.   Last Vitals:  Vitals:   12/01/22 1006 12/01/22 1053  BP: 131/85 114/72  Pulse: 93 100  Resp: 17 15  Temp: 37.3 C 37 C  SpO2: 96% 96%    Last Pain:  Vitals:   12/01/22 1053  TempSrc: Oral  PainSc: 0-No pain                 Louann Sjogren

## 2022-12-03 LAB — SURGICAL PATHOLOGY

## 2022-12-08 ENCOUNTER — Encounter (HOSPITAL_COMMUNITY): Payer: Self-pay | Admitting: Internal Medicine

## 2022-12-10 DIAGNOSIS — Z7984 Long term (current) use of oral hypoglycemic drugs: Secondary | ICD-10-CM | POA: Diagnosis not present

## 2022-12-10 DIAGNOSIS — K2900 Acute gastritis without bleeding: Secondary | ICD-10-CM | POA: Diagnosis not present

## 2022-12-10 DIAGNOSIS — Z7982 Long term (current) use of aspirin: Secondary | ICD-10-CM | POA: Diagnosis not present

## 2022-12-10 DIAGNOSIS — E119 Type 2 diabetes mellitus without complications: Secondary | ICD-10-CM | POA: Diagnosis not present

## 2022-12-10 DIAGNOSIS — Z794 Long term (current) use of insulin: Secondary | ICD-10-CM | POA: Diagnosis not present

## 2022-12-10 DIAGNOSIS — K219 Gastro-esophageal reflux disease without esophagitis: Secondary | ICD-10-CM | POA: Diagnosis not present

## 2022-12-10 DIAGNOSIS — R1013 Epigastric pain: Secondary | ICD-10-CM | POA: Diagnosis not present

## 2022-12-10 DIAGNOSIS — R1011 Right upper quadrant pain: Secondary | ICD-10-CM | POA: Diagnosis not present

## 2022-12-10 DIAGNOSIS — K760 Fatty (change of) liver, not elsewhere classified: Secondary | ICD-10-CM | POA: Diagnosis not present

## 2022-12-10 DIAGNOSIS — Z79899 Other long term (current) drug therapy: Secondary | ICD-10-CM | POA: Diagnosis not present

## 2022-12-10 DIAGNOSIS — E785 Hyperlipidemia, unspecified: Secondary | ICD-10-CM | POA: Diagnosis not present

## 2022-12-10 DIAGNOSIS — F17210 Nicotine dependence, cigarettes, uncomplicated: Secondary | ICD-10-CM | POA: Diagnosis not present

## 2022-12-11 DIAGNOSIS — G43909 Migraine, unspecified, not intractable, without status migrainosus: Secondary | ICD-10-CM | POA: Diagnosis not present

## 2022-12-11 DIAGNOSIS — F17210 Nicotine dependence, cigarettes, uncomplicated: Secondary | ICD-10-CM | POA: Diagnosis not present

## 2022-12-11 DIAGNOSIS — E119 Type 2 diabetes mellitus without complications: Secondary | ICD-10-CM | POA: Diagnosis not present

## 2022-12-11 DIAGNOSIS — Z79899 Other long term (current) drug therapy: Secondary | ICD-10-CM | POA: Diagnosis not present

## 2022-12-11 DIAGNOSIS — F32A Depression, unspecified: Secondary | ICD-10-CM | POA: Diagnosis not present

## 2022-12-12 DIAGNOSIS — R112 Nausea with vomiting, unspecified: Secondary | ICD-10-CM | POA: Diagnosis not present

## 2022-12-16 DIAGNOSIS — Z79891 Long term (current) use of opiate analgesic: Secondary | ICD-10-CM | POA: Diagnosis not present

## 2022-12-16 DIAGNOSIS — Z886 Allergy status to analgesic agent status: Secondary | ICD-10-CM | POA: Diagnosis not present

## 2022-12-16 DIAGNOSIS — M47816 Spondylosis without myelopathy or radiculopathy, lumbar region: Secondary | ICD-10-CM | POA: Diagnosis not present

## 2022-12-16 DIAGNOSIS — K5792 Diverticulitis of intestine, part unspecified, without perforation or abscess without bleeding: Secondary | ICD-10-CM | POA: Diagnosis not present

## 2022-12-16 DIAGNOSIS — Z7951 Long term (current) use of inhaled steroids: Secondary | ICD-10-CM | POA: Diagnosis not present

## 2022-12-16 DIAGNOSIS — K219 Gastro-esophageal reflux disease without esophagitis: Secondary | ICD-10-CM | POA: Diagnosis not present

## 2022-12-16 DIAGNOSIS — X58XXXA Exposure to other specified factors, initial encounter: Secondary | ICD-10-CM | POA: Diagnosis not present

## 2022-12-16 DIAGNOSIS — M545 Low back pain, unspecified: Secondary | ICD-10-CM | POA: Diagnosis not present

## 2022-12-16 DIAGNOSIS — S39012A Strain of muscle, fascia and tendon of lower back, initial encounter: Secondary | ICD-10-CM | POA: Diagnosis not present

## 2022-12-16 DIAGNOSIS — G8929 Other chronic pain: Secondary | ICD-10-CM | POA: Diagnosis not present

## 2022-12-16 DIAGNOSIS — Z880 Allergy status to penicillin: Secondary | ICD-10-CM | POA: Diagnosis not present

## 2022-12-16 DIAGNOSIS — Z7984 Long term (current) use of oral hypoglycemic drugs: Secondary | ICD-10-CM | POA: Diagnosis not present

## 2022-12-16 DIAGNOSIS — F17210 Nicotine dependence, cigarettes, uncomplicated: Secondary | ICD-10-CM | POA: Diagnosis not present

## 2022-12-16 DIAGNOSIS — E785 Hyperlipidemia, unspecified: Secondary | ICD-10-CM | POA: Diagnosis not present

## 2022-12-16 DIAGNOSIS — E119 Type 2 diabetes mellitus without complications: Secondary | ICD-10-CM | POA: Diagnosis not present

## 2022-12-18 ENCOUNTER — Encounter: Payer: Self-pay | Admitting: Radiology

## 2022-12-24 DIAGNOSIS — G8929 Other chronic pain: Secondary | ICD-10-CM | POA: Diagnosis not present

## 2022-12-24 DIAGNOSIS — M545 Low back pain, unspecified: Secondary | ICD-10-CM | POA: Diagnosis not present

## 2022-12-24 DIAGNOSIS — Z79899 Other long term (current) drug therapy: Secondary | ICD-10-CM | POA: Diagnosis not present

## 2022-12-24 DIAGNOSIS — F32A Depression, unspecified: Secondary | ICD-10-CM | POA: Diagnosis not present

## 2022-12-24 DIAGNOSIS — G894 Chronic pain syndrome: Secondary | ICD-10-CM | POA: Diagnosis not present

## 2022-12-24 DIAGNOSIS — M25561 Pain in right knee: Secondary | ICD-10-CM | POA: Diagnosis not present

## 2022-12-24 DIAGNOSIS — R109 Unspecified abdominal pain: Secondary | ICD-10-CM | POA: Diagnosis not present

## 2022-12-26 DIAGNOSIS — Z79899 Other long term (current) drug therapy: Secondary | ICD-10-CM | POA: Diagnosis not present

## 2023-01-02 DIAGNOSIS — M6281 Muscle weakness (generalized): Secondary | ICD-10-CM | POA: Diagnosis not present

## 2023-01-02 DIAGNOSIS — R2689 Other abnormalities of gait and mobility: Secondary | ICD-10-CM | POA: Diagnosis not present

## 2023-01-02 DIAGNOSIS — M545 Low back pain, unspecified: Secondary | ICD-10-CM | POA: Diagnosis not present

## 2023-01-09 DIAGNOSIS — M6281 Muscle weakness (generalized): Secondary | ICD-10-CM | POA: Diagnosis not present

## 2023-01-09 DIAGNOSIS — M545 Low back pain, unspecified: Secondary | ICD-10-CM | POA: Diagnosis not present

## 2023-01-09 DIAGNOSIS — R2689 Other abnormalities of gait and mobility: Secondary | ICD-10-CM | POA: Diagnosis not present

## 2023-01-16 DIAGNOSIS — Z6836 Body mass index (BMI) 36.0-36.9, adult: Secondary | ICD-10-CM | POA: Diagnosis not present

## 2023-01-16 DIAGNOSIS — M25572 Pain in left ankle and joints of left foot: Secondary | ICD-10-CM | POA: Diagnosis not present

## 2023-01-16 DIAGNOSIS — F319 Bipolar disorder, unspecified: Secondary | ICD-10-CM | POA: Diagnosis not present

## 2023-01-16 DIAGNOSIS — M79662 Pain in left lower leg: Secondary | ICD-10-CM | POA: Diagnosis not present

## 2023-01-16 DIAGNOSIS — F4310 Post-traumatic stress disorder, unspecified: Secondary | ICD-10-CM | POA: Diagnosis not present

## 2023-01-16 DIAGNOSIS — F17210 Nicotine dependence, cigarettes, uncomplicated: Secondary | ICD-10-CM | POA: Diagnosis not present

## 2023-01-16 DIAGNOSIS — M79602 Pain in left arm: Secondary | ICD-10-CM | POA: Diagnosis not present

## 2023-01-16 DIAGNOSIS — E119 Type 2 diabetes mellitus without complications: Secondary | ICD-10-CM | POA: Diagnosis not present

## 2023-01-16 DIAGNOSIS — Z880 Allergy status to penicillin: Secondary | ICD-10-CM | POA: Diagnosis not present

## 2023-01-16 DIAGNOSIS — Z794 Long term (current) use of insulin: Secondary | ICD-10-CM | POA: Diagnosis not present

## 2023-01-16 DIAGNOSIS — Z7984 Long term (current) use of oral hypoglycemic drugs: Secondary | ICD-10-CM | POA: Diagnosis not present

## 2023-01-16 DIAGNOSIS — Z79899 Other long term (current) drug therapy: Secondary | ICD-10-CM | POA: Diagnosis not present

## 2023-01-16 DIAGNOSIS — M25571 Pain in right ankle and joints of right foot: Secondary | ICD-10-CM | POA: Diagnosis not present

## 2023-01-16 DIAGNOSIS — Z888 Allergy status to other drugs, medicaments and biological substances status: Secondary | ICD-10-CM | POA: Diagnosis not present

## 2023-01-16 DIAGNOSIS — M79661 Pain in right lower leg: Secondary | ICD-10-CM | POA: Diagnosis not present

## 2023-01-20 DIAGNOSIS — G8929 Other chronic pain: Secondary | ICD-10-CM | POA: Diagnosis not present

## 2023-01-20 DIAGNOSIS — M25561 Pain in right knee: Secondary | ICD-10-CM | POA: Diagnosis not present

## 2023-01-20 DIAGNOSIS — Z79899 Other long term (current) drug therapy: Secondary | ICD-10-CM | POA: Diagnosis not present

## 2023-01-20 DIAGNOSIS — F32A Depression, unspecified: Secondary | ICD-10-CM | POA: Diagnosis not present

## 2023-01-20 DIAGNOSIS — M545 Low back pain, unspecified: Secondary | ICD-10-CM | POA: Diagnosis not present

## 2023-01-20 DIAGNOSIS — G894 Chronic pain syndrome: Secondary | ICD-10-CM | POA: Diagnosis not present

## 2023-01-20 DIAGNOSIS — R109 Unspecified abdominal pain: Secondary | ICD-10-CM | POA: Diagnosis not present

## 2023-01-21 DIAGNOSIS — F17210 Nicotine dependence, cigarettes, uncomplicated: Secondary | ICD-10-CM | POA: Diagnosis not present

## 2023-01-21 DIAGNOSIS — G43909 Migraine, unspecified, not intractable, without status migrainosus: Secondary | ICD-10-CM | POA: Diagnosis not present

## 2023-01-21 DIAGNOSIS — Z79899 Other long term (current) drug therapy: Secondary | ICD-10-CM | POA: Diagnosis not present

## 2023-01-22 DIAGNOSIS — Z79899 Other long term (current) drug therapy: Secondary | ICD-10-CM | POA: Diagnosis not present

## 2023-02-13 DIAGNOSIS — E669 Obesity, unspecified: Secondary | ICD-10-CM | POA: Diagnosis not present

## 2023-02-13 DIAGNOSIS — F909 Attention-deficit hyperactivity disorder, unspecified type: Secondary | ICD-10-CM | POA: Diagnosis not present

## 2023-02-13 DIAGNOSIS — F4310 Post-traumatic stress disorder, unspecified: Secondary | ICD-10-CM | POA: Diagnosis not present

## 2023-02-13 DIAGNOSIS — R21 Rash and other nonspecific skin eruption: Secondary | ICD-10-CM | POA: Diagnosis not present

## 2023-02-13 DIAGNOSIS — Z6835 Body mass index (BMI) 35.0-35.9, adult: Secondary | ICD-10-CM | POA: Diagnosis not present

## 2023-02-13 DIAGNOSIS — Z880 Allergy status to penicillin: Secondary | ICD-10-CM | POA: Diagnosis not present

## 2023-02-13 DIAGNOSIS — Z79899 Other long term (current) drug therapy: Secondary | ICD-10-CM | POA: Diagnosis not present

## 2023-02-13 DIAGNOSIS — Z7984 Long term (current) use of oral hypoglycemic drugs: Secondary | ICD-10-CM | POA: Diagnosis not present

## 2023-02-13 DIAGNOSIS — F17210 Nicotine dependence, cigarettes, uncomplicated: Secondary | ICD-10-CM | POA: Diagnosis not present

## 2023-02-13 DIAGNOSIS — F319 Bipolar disorder, unspecified: Secondary | ICD-10-CM | POA: Diagnosis not present

## 2023-02-13 DIAGNOSIS — Z794 Long term (current) use of insulin: Secondary | ICD-10-CM | POA: Diagnosis not present

## 2023-02-13 DIAGNOSIS — K219 Gastro-esophageal reflux disease without esophagitis: Secondary | ICD-10-CM | POA: Diagnosis not present

## 2023-02-13 DIAGNOSIS — E119 Type 2 diabetes mellitus without complications: Secondary | ICD-10-CM | POA: Diagnosis not present

## 2023-02-13 DIAGNOSIS — B86 Scabies: Secondary | ICD-10-CM | POA: Diagnosis not present

## 2023-02-13 DIAGNOSIS — E785 Hyperlipidemia, unspecified: Secondary | ICD-10-CM | POA: Diagnosis not present

## 2023-02-18 DIAGNOSIS — J00 Acute nasopharyngitis [common cold]: Secondary | ICD-10-CM | POA: Diagnosis not present

## 2023-02-19 DIAGNOSIS — Z79899 Other long term (current) drug therapy: Secondary | ICD-10-CM | POA: Diagnosis not present

## 2023-03-02 ENCOUNTER — Ambulatory Visit (INDEPENDENT_AMBULATORY_CARE_PROVIDER_SITE_OTHER): Payer: 59 | Admitting: Gastroenterology

## 2023-03-02 ENCOUNTER — Encounter: Payer: Self-pay | Admitting: Gastroenterology

## 2023-03-02 VITALS — BP 119/81 | HR 102 | Temp 98.2°F | Ht 67.0 in | Wt 221.4 lb

## 2023-03-02 DIAGNOSIS — G8929 Other chronic pain: Secondary | ICD-10-CM | POA: Insufficient documentation

## 2023-03-02 DIAGNOSIS — R109 Unspecified abdominal pain: Secondary | ICD-10-CM

## 2023-03-02 NOTE — Patient Instructions (Signed)
Call with recurrent diarrhea or worsening abdominal pain. You can reach my CMA, Tammy, directly at [phone]. Leave a message if she does not answer, she will return your call!

## 2023-03-02 NOTE — Progress Notes (Signed)
GI Office Note    Referring Provider: Shelby Dubin, FNP Primary Care Physician:  Shelby Dubin, FNP  Primary Gastroenterologist: Hennie Duos. Marletta Lor, DO   Chief Complaint   Chief Complaint  Patient presents with   Follow-up    Doing better, states pain comes and goes but all in all is doing better.    History of Present Illness   [name] is a 42 y.o. male presenting today for follow-up.  Seen in the office back in February for chronic abdominal pain.  He has GI history significant for sigmoid stricture, bowel obstruction, diverticulitis, and perforated cecum in 2020 requiring resection of right colon and resection of sigmoid colon with ileocolonic and colorectal anastomosis.  Developed multiple ventral hernias s/p laparoscopic incisional hernia repair with mesh and lysis of adhesions 01/04/2021.  Also seen for new onset diarrhea.  Longstanding GERD.  Follows with pain management for chronic abdominal pain.  Taking tramadol and gabapentin as needed.   CT A/P with contrast 06/23/2022 with enlarged liver and spleen, umbilical hernia containing fat. No acute abnormalities.   CT A/P with contrast 11/13/2022 (care everywhere) with no acute abnormalities, surgical changes involving the right colon and sigmoid colon,. Liver and spleen normal.   Labs 11/13/2022: CMP essentially normal CBC remarkable for WBC 14.2, hemoglobin 17.1  Today: Diarrhea got better so did not have the labs (TSH, celiac) and stools. Currently not have diarrhea. May go a couple of days without a stool. No melena, brbpr.Some days no abdominal pain. Finds that Tramadol does not help his abdominal pain. Rare nausea. No heartburn. No dysphagia.No melena, brbpr. When he has abdominal pain it is crampy in quality. Can last a day. He has chronic GERD since his 51s, worse when drinking etoh regularly.  Colonoscopy 11/2022: -Nonbleeding internal hemorrhoid -Patent side-to-side colocolonic anastomosis, healthy  appearing -Patent side-to-side ileocolonic anastomosis, healthy appearing -one 3 mm polyp in the descending colon removed, hyperplastic -one 7 mm polyp in the sigmoid colon removed, hyperplastic -No colon biopsies negative -Colonoscopy in 10 years  Medications   Current Outpatient Medications  Medication Sig Dispense Refill   albuterol (VENTOLIN HFA) 108 (90 Base) MCG/ACT inhaler Inhale 2 puffs into the lungs every 6 (six) hours as needed for wheezing or shortness of breath.     atorvastatin (LIPITOR) 40 MG tablet Take 1 tablet (40 mg total) by mouth daily. 90 tablet 3   Blood Glucose Monitoring Suppl (ONE TOUCH ULTRA 2) w/Device KIT Measure blood sugar daily and with meals for insulin 1 kit 0   cloNIDine HCl (KAPVAY) 0.1 MG TB12 ER tablet Take 0.1 mg by mouth daily.     diphenhydrAMINE (BENADRYL) 25 mg capsule Take 25 mg by mouth every 6 (six) hours as needed for allergies.     diphenhydramine-acetaminophen (TYLENOL PM) 25-500 MG TABS tablet Take 3 tablets by mouth at bedtime as needed (sleep).     escitalopram (LEXAPRO) 10 MG tablet Take 10 mg by mouth daily as needed (anxiety).     gabapentin (NEURONTIN) 300 MG capsule Take 300 mg by mouth 3 (three) times daily as needed (pain).     glipiZIDE (GLUCOTROL) 5 MG tablet Take 5 mg by mouth 2 (two) times daily.     glucose blood (ONETOUCH ULTRA) test strip Use as instructed to monitor glucose 4 times daily 100 each 12   HUMALOG 100 UNIT/ML injection Inject 11 Units into the skin 3 (three) times daily with meals.     Lancets (  ONETOUCH ULTRASOFT) lancets Use as instructed to monitor glucose 4 times daily 100 each 12   LANTUS SOLOSTAR 100 UNIT/ML Solostar Pen Inject 40 Units into the skin at bedtime.     LATUDA 60 MG TABS Take 60 mg by mouth at bedtime.     methocarbamol (ROBAXIN) 500 MG tablet Take 250-500 mg by mouth 2 (two) times daily as needed for muscle spasms.     omeprazole (PRILOSEC) 40 MG capsule Take 40 mg by mouth at bedtime.      ondansetron (ZOFRAN-ODT) 4 MG disintegrating tablet Take 4 mg by mouth every 8 (eight) hours as needed for vomiting or nausea.     Potassium 99 MG TABS Take 99 mg by mouth daily.     traMADol (ULTRAM) 50 MG tablet Take 50 mg by mouth 3 (three) times daily as needed for moderate pain.     traZODone (DESYREL) 100 MG tablet Take 100-200 mg by mouth at bedtime.     VYVANSE 20 MG capsule Take 20 mg by mouth daily with breakfast. Take with 50 mg for a total of 70 mg     VYVANSE 50 MG capsule Take 50 mg by mouth See admin instructions. Take with 20 mg for a total of 70 mg in the morning     No current facility-administered medications for this visit.    Allergies   Allergies as of 03/02/2023 - Review Complete 03/02/2023  Allergen Reaction Noted   Metformin and related Other (See Comments) 07/08/2016   Penicillins Anaphylaxis, Hives, Shortness Of Breath, and Swelling 10/14/2016   Nsaids Other (See Comments) 11/23/2020       Review of Systems   General: Negative for anorexia, weight loss, fever, chills, fatigue, weakness. ENT: Negative for hoarseness, difficulty swallowing , nasal congestion. CV: Negative for chest pain, angina, palpitations, dyspnea on exertion, peripheral edema.  Respiratory: Negative for dyspnea at rest, dyspnea on exertion, cough, sputum, wheezing.  GI: See history of present illness. GU:  Negative for dysuria, hematuria, urinary incontinence, urinary frequency, nocturnal urination.  Endo: Negative for unusual weight change.     Physical Exam   BP 119/81 (BP Location: Right Arm, Patient Position: Sitting, Cuff Size: Large)   Pulse (!) 102   Temp 98.2 F (36.8 C) (Oral)   Ht 5\' 7"  (1.702 m)   Wt 221 lb 6.4 oz (100.4 kg)   SpO2 97%   BMI 34.68 kg/m    General: Well-nourished, well-developed in no acute distress.  Eyes: No icterus. Mouth: Oropharyngeal mucosa moist and pink   Lungs: Clear to auscultation bilaterally.  Heart: Regular rate and rhythm, no murmurs  rubs or gallops.  Abdomen: Bowel sounds are normal, nontender, nondistended, no hepatosplenomegaly or masses,  no abdominal bruits or hernia , no rebound or guarding.  Rectal: not performed Extremities: No lower extremity edema. No clubbing or deformities. Neuro: Alert and oriented x 4   Skin: Warm and dry, no jaundice.   Psych: Alert and cooperative, normal mood and affect.  Labs   Labs from March 2024: Blood cell count 9900, hemoglobin 14.7, platelets 192,000.  Labs from February 2024: Glucose 319, albumin 2.7, total bilirubin 0.3, alk phos 88, AST 16, ALT 40, creatinine 0.85. Imaging Studies   No results found.  Assessment   Chronic abdominal pain: stable  Diarrhea: resolved  Chronic GERD: doing well. Discussed consideration of EGD for Barrett's screening when he has his next colonoscopy at age 70. Patient agreeable.   Question of enlarge spleen/liver on CT imaging  2023, not mentioned on 10/2022 CT. Prior history of etoh abuse, quit in 2013. No indication of advanced liver disease via labs. Consider abd u/s with elastography if patient agreeable.   PLAN   Call with any recurrent diarrhea or worsening abdominal pain.    Leanna Battles. Melvyn Neth, MHS, PA-C [facility]

## 2023-03-09 ENCOUNTER — Other Ambulatory Visit: Payer: Self-pay | Admitting: Nurse Practitioner

## 2023-03-12 ENCOUNTER — Telehealth: Payer: Self-pay | Admitting: Gastroenterology

## 2023-03-12 NOTE — Telephone Encounter (Signed)
Please let pt know when reviewing his chart further, I would recommend abdominal u/s with elastography to get a better look at his liver and spleen to make sure no signs of chronic liver disease. If agreeable please schedule.   He has had several CTs, last one did not mention enlarged liver or spleen like the one in 2023. One in 2022, mentioned nodular liver border. There are some discrepancies.

## 2023-03-13 ENCOUNTER — Other Ambulatory Visit: Payer: Self-pay | Admitting: *Deleted

## 2023-03-13 DIAGNOSIS — G8929 Other chronic pain: Secondary | ICD-10-CM

## 2023-03-13 NOTE — Telephone Encounter (Signed)
[facility]  [place] scheduled for Thursday, 04/03/23 at [facility], arrive at 8:15 am, NPO after midnight

## 2023-03-13 NOTE — Telephone Encounter (Signed)
Lmom for pt to return my call.  

## 2023-03-13 NOTE — Telephone Encounter (Signed)
Pt informed of [place] on 04/02/23, verbalized understanding of instructions

## 2023-03-13 NOTE — Telephone Encounter (Signed)
Pt is agreeable and would like to move forward with scheduling.

## 2023-03-24 ENCOUNTER — Emergency Department (HOSPITAL_COMMUNITY)
Admission: EM | Admit: 2023-03-24 | Discharge: 2023-03-24 | Disposition: A | Discharge status: Home / Self Care | Payer: 59 | Attending: Emergency Medicine | Admitting: Emergency Medicine

## 2023-03-24 ENCOUNTER — Encounter (HOSPITAL_COMMUNITY): Payer: Self-pay | Admitting: *Deleted

## 2023-03-24 ENCOUNTER — Other Ambulatory Visit: Payer: Self-pay

## 2023-03-24 DIAGNOSIS — Z7984 Long term (current) use of oral hypoglycemic drugs: Secondary | ICD-10-CM | POA: Diagnosis not present

## 2023-03-24 DIAGNOSIS — R7302 Impaired glucose tolerance (oral): Secondary | ICD-10-CM | POA: Diagnosis not present

## 2023-03-24 DIAGNOSIS — E119 Type 2 diabetes mellitus without complications: Secondary | ICD-10-CM | POA: Insufficient documentation

## 2023-03-24 DIAGNOSIS — R7309 Other abnormal glucose: Secondary | ICD-10-CM

## 2023-03-24 DIAGNOSIS — Z794 Long term (current) use of insulin: Secondary | ICD-10-CM | POA: Insufficient documentation

## 2023-03-24 DIAGNOSIS — R1084 Generalized abdominal pain: Secondary | ICD-10-CM | POA: Diagnosis present

## 2023-03-24 LAB — CBC WITH DIFFERENTIAL/PLATELET
Abs Immature Granulocytes: 0.02 10*3/uL (ref 0.00–0.07)
Basophils Absolute: 0.1 10*3/uL (ref 0.0–0.1)
Basophils Relative: 1 %
Eosinophils Absolute: 0.2 10*3/uL (ref 0.0–0.5)
Eosinophils Relative: 2 %
HCT: 44.4 % (ref 39.0–52.0)
Hemoglobin: 15.5 g/dL (ref 13.0–17.0)
Immature Granulocytes: 0 %
Lymphocytes Relative: 32 %
Lymphs Abs: 3.3 10*3/uL (ref 0.7–4.0)
MCH: 28.2 pg (ref 26.0–34.0)
MCHC: 34.9 g/dL (ref 30.0–36.0)
MCV: 80.7 fL (ref 80.0–100.0)
Monocytes Absolute: 0.5 10*3/uL (ref 0.1–1.0)
Monocytes Relative: 5 %
Neutro Abs: 6.2 10*3/uL (ref 1.7–7.7)
Neutrophils Relative %: 60 %
Platelets: 171 10*3/uL (ref 150–400)
RBC: 5.5 MIL/uL (ref 4.22–5.81)
RDW: 13.7 % (ref 11.5–15.5)
WBC: 10.4 10*3/uL (ref 4.0–10.5)
nRBC: 0 % (ref 0.0–0.2)

## 2023-03-24 LAB — URINALYSIS, ROUTINE W REFLEX MICROSCOPIC
Bacteria, UA: NONE SEEN
Bilirubin Urine: NEGATIVE
Glucose, UA: >=500 mg/dL — AB
Hgb urine dipstick: NEGATIVE
Ketones, ur: NEGATIVE mg/dL
Leukocytes,Ua: NEGATIVE
Nitrite: NEGATIVE
Protein, ur: NEGATIVE mg/dL
Specific Gravity, Urine: 1.035 — ABNORMAL HIGH (ref 1.005–1.030)
pH: 7 (ref 5.0–8.0)

## 2023-03-24 LAB — COMPREHENSIVE METABOLIC PANEL
ALT: 45 U/L — ABNORMAL HIGH (ref 0–44)
AST: 29 U/L (ref 15–41)
Albumin: 4.1 g/dL (ref 3.5–5.0)
Alkaline Phosphatase: 99 U/L (ref 38–126)
Anion gap: 9 (ref 5–15)
BUN: 10 mg/dL (ref 6–20)
CO2: 25 mmol/L (ref 22–32)
Calcium: 9.1 mg/dL (ref 8.9–10.3)
Chloride: 95 mmol/L — ABNORMAL LOW (ref 98–111)
Creatinine, Ser: 0.82 mg/dL (ref 0.61–1.24)
GFR, Estimated: >60 mL/min (ref >60)
Glucose, Bld: 412 mg/dL — ABNORMAL HIGH (ref 70–99)
Potassium: 4.2 mmol/L (ref 3.5–5.1)
Sodium: 129 mmol/L — ABNORMAL LOW (ref 135–145)
Total Bilirubin: 0.4 mg/dL (ref 0.3–1.2)
Total Protein: 8 g/dL (ref 6.5–8.1)

## 2023-03-24 MED ORDER — ONDANSETRON HCL 4 MG/2ML IJ SOLN
4.0000 mg | Freq: Once | INTRAMUSCULAR | Status: AC
  Administered 2023-03-24: 4 mg via INTRAVENOUS
  Filled 2023-03-24: qty 2

## 2023-03-24 MED ORDER — PANTOPRAZOLE SODIUM 40 MG IV SOLR
40.0000 mg | Freq: Once | INTRAVENOUS | Status: AC
  Administered 2023-03-24: 40 mg via INTRAVENOUS
  Filled 2023-03-24: qty 10

## 2023-03-24 MED ORDER — SODIUM CHLORIDE 0.9 % IV BOLUS
500.0000 mL | Freq: Once | INTRAVENOUS | Status: AC
  Administered 2023-03-24: 500 mL via INTRAVENOUS

## 2023-03-24 MED ORDER — HYDROMORPHONE HCL 1 MG/ML IJ SOLN
0.5000 mg | Freq: Once | INTRAMUSCULAR | Status: AC
  Administered 2023-03-24: 0.5 mg via INTRAVENOUS
  Filled 2023-03-24: qty 0.5

## 2023-03-24 NOTE — Discharge Instructions (Signed)
Drink plenty of fluids.  Make sure you take your diabetic and follow-up with your doctor in the next 2 to 3 days to check your sugar and your symptoms

## 2023-03-24 NOTE — ED Provider Notes (Signed)
[place] EMERGENCY DEPARTMENT AT [facility] Provider Note   CSN: [id number] Arrival date & time: 03/24/23  1014     History {Add pertinent medical, surgical, social history, OB history to HPI:1} Chief Complaint  Patient presents with   Abdominal Pain    [name] is a 42 y.o. male.  Patient has a history of diabetes and chronic abdominal pain.  He complains of some discomfort laterally both sides of the abdomen going down to the legs.   Abdominal Pain      Home Medications Prior to Admission medications   Medication Sig Start Date End Date Taking? Authorizing Provider  albuterol (VENTOLIN HFA) 108 (90 Base) MCG/ACT inhaler Inhale 2 puffs into the lungs every 6 (six) hours as needed for wheezing or shortness of breath.    [provider]  atorvastatin (LIPITOR) 40 MG tablet Take 1 tablet (40 mg total) by mouth daily. 08/22/21   Cora Collum, DO  Blood Glucose Monitoring Suppl (ONE TOUCH ULTRA 2) w/Device KIT Measure blood sugar daily and with meals for insulin 10/22/21   Dani Gobble, NP  cloNIDine HCl (KAPVAY) 0.1 MG TB12 ER tablet Take 0.1 mg by mouth daily. 12/26/22   [provider]  diphenhydrAMINE (BENADRYL) 25 mg capsule Take 25 mg by mouth every 6 (six) hours as needed for allergies. 11/05/22   [provider]  diphenhydramine-acetaminophen (TYLENOL PM) 25-500 MG TABS tablet Take 3 tablets by mouth at bedtime as needed (sleep).    [provider]  escitalopram (LEXAPRO) 10 MG tablet Take 10 mg by mouth daily as needed (anxiety).    [provider]  gabapentin (NEURONTIN) 300 MG capsule Take 300 mg by mouth 3 (three) times daily as needed (pain). 09/05/22   [provider]  glipiZIDE (GLUCOTROL) 5 MG tablet Take 5 mg by mouth 2 (two) times daily.    [provider]  glucose blood (ONETOUCH ULTRA) test strip Use as instructed to monitor glucose 4 times daily 10/22/21   Dani Gobble, NP   HUMALOG 100 UNIT/ML injection inject 5-11 units into THE SKIN THREE TIMES DAILY with meals 03/11/23   Dani Gobble, NP  Lancets [facility] ULTRASOFT) lancets Use as instructed to monitor glucose 4 times daily 10/22/21   Dani Gobble, NP  LANTUS 100 UNIT/ML injection INJECT 40 units into THE SKIN AT BEDTIME 03/11/23   Dani Gobble, NP  LANTUS SOLOSTAR 100 UNIT/ML Solostar Pen Inject 40 Units into the skin at bedtime.    [provider]  LATUDA 60 MG TABS Take 60 mg by mouth at bedtime. 09/18/21   [provider]  methocarbamol (ROBAXIN) 500 MG tablet Take 250-500 mg by mouth 2 (two) times daily as needed for muscle spasms. 11/21/22   [provider]  omeprazole (PRILOSEC) 40 MG capsule Take 40 mg by mouth at bedtime.    [provider]  ondansetron (ZOFRAN-ODT) 4 MG disintegrating tablet Take 4 mg by mouth every 8 (eight) hours as needed for vomiting or nausea. 11/05/22   [provider]  Potassium 99 MG TABS Take 99 mg by mouth daily.    [provider]  traMADol (ULTRAM) 50 MG tablet Take 50 mg by mouth 3 (three) times daily as needed for moderate pain. 12/30/21   [provider]  traZODone (DESYREL) 100 MG tablet Take 100-200 mg by mouth at bedtime. 12/05/19   [provider]  VYVANSE 20 MG capsule Take 20 mg by  mouth daily with breakfast. Take with 50 mg for a total of 70 mg 06/28/18   [provider]  VYVANSE 50 MG capsule Take 50 mg by mouth See admin instructions. Take with 20 mg for a total of 70 mg in the morning 06/28/18   [provider]      Allergies    Metformin and related, Penicillins, and Nsaids    Review of Systems   Review of Systems  Gastrointestinal:  Positive for abdominal pain.    Physical Exam Updated Vital Signs BP (!) 140/78   Pulse 97   Temp 97.7 F (36.5 C) (Oral)   Resp 18   Ht 5\' 7"  (1.702 m)   Wt 100.7 kg   SpO2 95%   BMI 34.77 kg/m  Physical Exam  ED  Results / Procedures / Treatments   Labs (all labs ordered are listed, but only abnormal results are displayed) Labs Reviewed  COMPREHENSIVE METABOLIC PANEL - Abnormal; Notable for the following components:      Result Value   Sodium 129 (*)    Chloride 95 (*)    Glucose, Bld 412 (*)    ALT 45 (*)    All other components within normal limits  URINALYSIS, ROUTINE W REFLEX MICROSCOPIC - Abnormal; Notable for the following components:   Color, Urine STRAW (*)    Specific Gravity, Urine 1.035 (*)    Glucose, UA >=500 (*)    All other components within normal limits  CBC WITH DIFFERENTIAL/PLATELET    EKG None  Radiology No results found.  Procedures Procedures  {Document cardiac monitor, telemetry assessment procedure when appropriate:1}  Medications Ordered in ED Medications  sodium chloride 0.9 % bolus 500 mL (0 mLs Intravenous Stopped 03/24/23 1305)  pantoprazole (PROTONIX) injection 40 mg (40 mg Intravenous Given 03/24/23 1137)  ondansetron (ZOFRAN) injection 4 mg (4 mg Intravenous Given 03/24/23 1137)  HYDROmorphone (DILAUDID) injection 0.5 mg (0.5 mg Intravenous Given 03/24/23 1137)    ED Course/ Medical Decision Making/ A&P   {   Click here for ABCD2, HEART and other calculatorsREFRESH Note before signing :1}                          Medical Decision Making Amount and/or Complexity of Data Reviewed Labs: ordered.  Risk Prescription drug management.  Patient with aspiration of cough abdominal pain and poorly controlled diabetes.  Patient is feeling better and wants to be discharged so he will follow-up with his PCP in 2 to 3 days and make sure he takes his diabetic medicines and drink plenty of fluids  {Document critical care time when appropriate:1} {Document review of labs and clinical decision tools ie heart score, Chads2Vasc2 etc:1}  {Document your independent review of radiology images, and any outside records:1} {Document your discussion with family members,  caretakers, and with consultants:1} {Document social determinants of health affecting pt's care:1} {Document your decision making why or why not admission, treatments were needed:1} Final Clinical Impression(s) / ED Diagnoses Final diagnoses:  Elevated glucose    Rx / DC Orders ED Discharge Orders     None

## 2023-03-24 NOTE — ED Triage Notes (Signed)
Pt with c/o abd cramping to bilateral sides, pain radiates down bilateral legs. Pt states he is to have an [place] on 6/12.

## 2023-03-24 NOTE — ED Notes (Signed)
Pt unable to give urine sample at this time. Urinal is at bedside.

## 2023-03-27 ENCOUNTER — Telehealth: Payer: Self-pay | Admitting: *Deleted

## 2023-03-27 NOTE — Telephone Encounter (Signed)
Transition Care Management Unsuccessful Follow-up Telephone Call  Date of discharge and from where:  [name]  Attempts:  1st Attempt  Reason for unsuccessful TCM follow-up call:  Left voice message

## 2023-03-30 ENCOUNTER — Telehealth: Payer: Self-pay | Admitting: *Deleted

## 2023-03-30 NOTE — Telephone Encounter (Signed)
Transition Care Management Unsuccessful Follow-up Telephone Call  Date of discharge and from where:  [name]/24/2023  Attempts:  2nd Attempt  Reason for unsuccessful TCM follow-up call:  Left voice message

## 2023-04-01 DIAGNOSIS — Z713 Dietary counseling and surveillance: Secondary | ICD-10-CM | POA: Diagnosis not present

## 2023-04-01 DIAGNOSIS — R109 Unspecified abdominal pain: Secondary | ICD-10-CM | POA: Diagnosis not present

## 2023-04-01 DIAGNOSIS — E785 Hyperlipidemia, unspecified: Secondary | ICD-10-CM | POA: Diagnosis not present

## 2023-04-01 DIAGNOSIS — E1165 Type 2 diabetes mellitus with hyperglycemia: Secondary | ICD-10-CM | POA: Diagnosis not present

## 2023-04-01 DIAGNOSIS — E559 Vitamin D deficiency, unspecified: Secondary | ICD-10-CM | POA: Diagnosis not present

## 2023-04-01 DIAGNOSIS — Z7182 Exercise counseling: Secondary | ICD-10-CM | POA: Diagnosis not present

## 2023-04-01 DIAGNOSIS — K219 Gastro-esophageal reflux disease without esophagitis: Secondary | ICD-10-CM | POA: Diagnosis not present

## 2023-04-01 DIAGNOSIS — F17200 Nicotine dependence, unspecified, uncomplicated: Secondary | ICD-10-CM | POA: Diagnosis not present

## 2023-04-02 ENCOUNTER — Ambulatory Visit (HOSPITAL_COMMUNITY)
Admission: RE | Admit: 2023-04-02 | Discharge: 2023-04-02 | Disposition: A | Discharge status: Home / Self Care | Payer: 59 | Source: Ambulatory Visit | Attending: Gastroenterology | Admitting: Gastroenterology

## 2023-04-02 DIAGNOSIS — Z7984 Long term (current) use of oral hypoglycemic drugs: Secondary | ICD-10-CM | POA: Diagnosis not present

## 2023-04-02 DIAGNOSIS — G8929 Other chronic pain: Secondary | ICD-10-CM | POA: Diagnosis not present

## 2023-04-02 DIAGNOSIS — R109 Unspecified abdominal pain: Secondary | ICD-10-CM | POA: Diagnosis not present

## 2023-04-02 DIAGNOSIS — Z880 Allergy status to penicillin: Secondary | ICD-10-CM | POA: Diagnosis not present

## 2023-04-02 DIAGNOSIS — E1165 Type 2 diabetes mellitus with hyperglycemia: Secondary | ICD-10-CM | POA: Diagnosis not present

## 2023-04-02 DIAGNOSIS — E860 Dehydration: Secondary | ICD-10-CM | POA: Diagnosis not present

## 2023-04-02 DIAGNOSIS — E785 Hyperlipidemia, unspecified: Secondary | ICD-10-CM | POA: Diagnosis not present

## 2023-04-02 DIAGNOSIS — Z720 Tobacco use: Secondary | ICD-10-CM | POA: Diagnosis not present

## 2023-04-02 DIAGNOSIS — F17210 Nicotine dependence, cigarettes, uncomplicated: Secondary | ICD-10-CM | POA: Diagnosis not present

## 2023-04-08 ENCOUNTER — Telehealth: Payer: Self-pay | Admitting: *Deleted

## 2023-04-08 NOTE — Telephone Encounter (Signed)
Tried to call pt, but DPR stated I couldn't leave a voicemail on that number so sent my chart message to patient with appointment date and let pt know we could not refill any medication until patient is seen in the office.  We also put him on the cancellation list in case someone cancels before August.

## 2023-04-08 NOTE — Telephone Encounter (Signed)
Noted  

## 2023-04-08 NOTE — Telephone Encounter (Signed)
Rec'd a call from the patient's mail order pharmacy requesting refills for the patient's Lantus/Humalog. Patient has not been seen here since 12/09/2021. We had rec'd a request back on 11/18/22 for the same medications. They were notified and told that the patient needed appointment prior to any refills.  Will have the front desk to reach out and see if the patient will make appointment for [place].

## 2023-04-08 NOTE — Telephone Encounter (Signed)
No refills until he is seen in office

## 2023-05-08 DIAGNOSIS — R079 Chest pain, unspecified: Secondary | ICD-10-CM | POA: Diagnosis not present

## 2023-05-08 DIAGNOSIS — Z886 Allergy status to analgesic agent status: Secondary | ICD-10-CM | POA: Diagnosis not present

## 2023-05-08 DIAGNOSIS — E785 Hyperlipidemia, unspecified: Secondary | ICD-10-CM | POA: Diagnosis not present

## 2023-05-08 DIAGNOSIS — R918 Other nonspecific abnormal finding of lung field: Secondary | ICD-10-CM | POA: Diagnosis not present

## 2023-05-08 DIAGNOSIS — Z880 Allergy status to penicillin: Secondary | ICD-10-CM | POA: Diagnosis not present

## 2023-05-08 DIAGNOSIS — Z7984 Long term (current) use of oral hypoglycemic drugs: Secondary | ICD-10-CM | POA: Diagnosis not present

## 2023-05-08 DIAGNOSIS — Z7951 Long term (current) use of inhaled steroids: Secondary | ICD-10-CM | POA: Diagnosis not present

## 2023-05-08 DIAGNOSIS — Z881 Allergy status to other antibiotic agents status: Secondary | ICD-10-CM | POA: Diagnosis not present

## 2023-05-08 DIAGNOSIS — Z79899 Other long term (current) drug therapy: Secondary | ICD-10-CM | POA: Diagnosis not present

## 2023-05-08 DIAGNOSIS — E119 Type 2 diabetes mellitus without complications: Secondary | ICD-10-CM | POA: Diagnosis not present

## 2023-05-08 DIAGNOSIS — Z794 Long term (current) use of insulin: Secondary | ICD-10-CM | POA: Diagnosis not present

## 2023-05-08 DIAGNOSIS — K219 Gastro-esophageal reflux disease without esophagitis: Secondary | ICD-10-CM | POA: Diagnosis not present

## 2023-05-08 DIAGNOSIS — Z888 Allergy status to other drugs, medicaments and biological substances status: Secondary | ICD-10-CM | POA: Diagnosis not present

## 2023-05-08 DIAGNOSIS — F17210 Nicotine dependence, cigarettes, uncomplicated: Secondary | ICD-10-CM | POA: Diagnosis not present

## 2023-05-09 DIAGNOSIS — E119 Type 2 diabetes mellitus without complications: Secondary | ICD-10-CM | POA: Diagnosis not present

## 2023-05-09 DIAGNOSIS — M549 Dorsalgia, unspecified: Secondary | ICD-10-CM | POA: Diagnosis not present

## 2023-05-09 DIAGNOSIS — Z886 Allergy status to analgesic agent status: Secondary | ICD-10-CM | POA: Diagnosis not present

## 2023-05-09 DIAGNOSIS — Z794 Long term (current) use of insulin: Secondary | ICD-10-CM | POA: Diagnosis not present

## 2023-05-09 DIAGNOSIS — F17210 Nicotine dependence, cigarettes, uncomplicated: Secondary | ICD-10-CM | POA: Diagnosis not present

## 2023-05-09 DIAGNOSIS — Z79899 Other long term (current) drug therapy: Secondary | ICD-10-CM | POA: Diagnosis not present

## 2023-05-09 DIAGNOSIS — E785 Hyperlipidemia, unspecified: Secondary | ICD-10-CM | POA: Diagnosis not present

## 2023-05-09 DIAGNOSIS — R0789 Other chest pain: Secondary | ICD-10-CM | POA: Diagnosis not present

## 2023-05-09 DIAGNOSIS — X58XXXA Exposure to other specified factors, initial encounter: Secondary | ICD-10-CM | POA: Diagnosis not present

## 2023-05-09 DIAGNOSIS — Z7984 Long term (current) use of oral hypoglycemic drugs: Secondary | ICD-10-CM | POA: Diagnosis not present

## 2023-05-09 DIAGNOSIS — G43909 Migraine, unspecified, not intractable, without status migrainosus: Secondary | ICD-10-CM | POA: Diagnosis not present

## 2023-05-09 DIAGNOSIS — S29011A Strain of muscle and tendon of front wall of thorax, initial encounter: Secondary | ICD-10-CM | POA: Diagnosis not present

## 2023-05-09 DIAGNOSIS — G43919 Migraine, unspecified, intractable, without status migrainosus: Secondary | ICD-10-CM | POA: Diagnosis not present

## 2023-05-12 DIAGNOSIS — R509 Fever, unspecified: Secondary | ICD-10-CM | POA: Diagnosis not present

## 2023-05-12 DIAGNOSIS — R1084 Generalized abdominal pain: Secondary | ICD-10-CM | POA: Diagnosis not present

## 2023-05-12 DIAGNOSIS — Z794 Long term (current) use of insulin: Secondary | ICD-10-CM | POA: Diagnosis not present

## 2023-05-12 DIAGNOSIS — R1032 Left lower quadrant pain: Secondary | ICD-10-CM | POA: Diagnosis not present

## 2023-05-12 DIAGNOSIS — Z79899 Other long term (current) drug therapy: Secondary | ICD-10-CM | POA: Diagnosis not present

## 2023-05-12 DIAGNOSIS — E119 Type 2 diabetes mellitus without complications: Secondary | ICD-10-CM | POA: Diagnosis not present

## 2023-05-12 DIAGNOSIS — F17210 Nicotine dependence, cigarettes, uncomplicated: Secondary | ICD-10-CM | POA: Diagnosis not present

## 2023-05-12 DIAGNOSIS — E785 Hyperlipidemia, unspecified: Secondary | ICD-10-CM | POA: Diagnosis not present

## 2023-05-12 DIAGNOSIS — Z880 Allergy status to penicillin: Secondary | ICD-10-CM | POA: Diagnosis not present

## 2023-05-12 DIAGNOSIS — Z7984 Long term (current) use of oral hypoglycemic drugs: Secondary | ICD-10-CM | POA: Diagnosis not present

## 2023-05-21 ENCOUNTER — Emergency Department (HOSPITAL_COMMUNITY)
Admission: EM | Admit: 2023-05-21 | Discharge: 2023-05-21 | Discharge status: Against Medical Advice | Payer: 59 | Source: Home / Self Care

## 2023-05-22 DIAGNOSIS — G4700 Insomnia, unspecified: Secondary | ICD-10-CM | POA: Diagnosis not present

## 2023-05-22 DIAGNOSIS — Z79899 Other long term (current) drug therapy: Secondary | ICD-10-CM | POA: Diagnosis not present

## 2023-05-22 DIAGNOSIS — R030 Elevated blood-pressure reading, without diagnosis of hypertension: Secondary | ICD-10-CM | POA: Diagnosis not present

## 2023-05-27 ENCOUNTER — Telehealth: Payer: Self-pay

## 2023-05-27 NOTE — Telephone Encounter (Signed)
Transition Care Management Unsuccessful Follow-up Telephone Call  Date of discharge and from where:  [facility]  Attempts:  1st Attempt  Reason for unsuccessful TCM follow-up call:  No answer/busy   Lenard Forth [facility] Guide, [facility] [phone] [address] [place], [place], [place] [address] Phone: [phone] Email: [email or web address]

## 2023-05-28 ENCOUNTER — Telehealth: Payer: Self-pay

## 2023-05-28 DIAGNOSIS — Z79899 Other long term (current) drug therapy: Secondary | ICD-10-CM | POA: Diagnosis not present

## 2023-05-28 NOTE — Telephone Encounter (Signed)
Transition Care Management Unsuccessful Follow-up Telephone Call  Date of discharge and from where:  [name] 8/1   Attempts:  2nd Attempt  Reason for unsuccessful TCM follow-up call:  No answer/busy

## 2023-06-09 ENCOUNTER — Ambulatory Visit: Payer: 59 | Admitting: Nurse Practitioner

## 2023-06-09 DIAGNOSIS — E559 Vitamin D deficiency, unspecified: Secondary | ICD-10-CM

## 2023-06-09 DIAGNOSIS — Z7984 Long term (current) use of oral hypoglycemic drugs: Secondary | ICD-10-CM

## 2023-06-09 DIAGNOSIS — E782 Mixed hyperlipidemia: Secondary | ICD-10-CM

## 2023-06-09 DIAGNOSIS — Z794 Long term (current) use of insulin: Secondary | ICD-10-CM

## 2023-06-10 DIAGNOSIS — R109 Unspecified abdominal pain: Secondary | ICD-10-CM | POA: Diagnosis not present

## 2023-06-10 DIAGNOSIS — M25561 Pain in right knee: Secondary | ICD-10-CM | POA: Diagnosis not present

## 2023-06-10 DIAGNOSIS — G8929 Other chronic pain: Secondary | ICD-10-CM | POA: Diagnosis not present

## 2023-06-10 DIAGNOSIS — F32A Depression, unspecified: Secondary | ICD-10-CM | POA: Diagnosis not present

## 2023-06-10 DIAGNOSIS — M545 Low back pain, unspecified: Secondary | ICD-10-CM | POA: Diagnosis not present

## 2023-06-10 DIAGNOSIS — Z79899 Other long term (current) drug therapy: Secondary | ICD-10-CM | POA: Diagnosis not present

## 2023-06-12 DIAGNOSIS — Z79899 Other long term (current) drug therapy: Secondary | ICD-10-CM | POA: Diagnosis not present

## 2023-06-18 ENCOUNTER — Ambulatory Visit: Payer: 59 | Admitting: Neurology

## 2023-07-16 ENCOUNTER — Ambulatory Visit: Payer: 59 | Admitting: Nurse Practitioner

## 2023-07-21 ENCOUNTER — Ambulatory Visit: Payer: 59 | Admitting: Neurology

## 2023-07-21 ENCOUNTER — Encounter: Payer: Self-pay | Admitting: Neurology

## 2023-08-04 ENCOUNTER — Ambulatory Visit: Payer: 59 | Admitting: Gastroenterology

## 2023-08-04 ENCOUNTER — Encounter: Payer: Self-pay | Admitting: Gastroenterology

## 2023-08-04 VITALS — BP 106/74 | HR 105 | Temp 98.3°F | Ht 67.0 in | Wt 225.8 lb

## 2023-08-04 DIAGNOSIS — R109 Unspecified abdominal pain: Secondary | ICD-10-CM

## 2023-08-04 DIAGNOSIS — K5903 Drug induced constipation: Secondary | ICD-10-CM

## 2023-08-04 DIAGNOSIS — K5900 Constipation, unspecified: Secondary | ICD-10-CM | POA: Diagnosis not present

## 2023-08-04 DIAGNOSIS — K219 Gastro-esophageal reflux disease without esophagitis: Secondary | ICD-10-CM

## 2023-08-04 MED ORDER — FAMOTIDINE 20 MG PO TABS: 20.0000 mg | ORAL_TABLET | Freq: Every day | ORAL | 5 refills | Status: DC | PRN

## 2023-08-04 NOTE — Patient Instructions (Addendum)
Trial of Linzess once daily on empty stomach. Samples provided today. Once you finish samples, you can go back to your prescribed Linzess which is once daily. You could take two of the once daily if needed to effectively control your constipation. Continue omeprazole 40mg  daily for acid reflux. You can take Pepcid 20mg  daily as needed for ongoing heartburn. RX sent to your pharmacy. Send [place] a message in couple of weeks and let me know how you are doing!

## 2023-08-04 NOTE — Progress Notes (Signed)
GI Office Note    Referring Provider: Shelby Dubin, FNP Primary Care Physician:  Shelby Dubin, FNP  Primary Gastroenterologist: Hennie Duos. Marletta Lor, DO   Chief Complaint   Chief Complaint  Patient presents with   Constipation    Blood in stools, was recently prescribed linzess 72, been on 3 days doesn't seem to be working.    History of Present Illness   [name] is a 42 y.o. male presenting today for abdominal plan, blood in the stool, constipation.  Last seen in May 2024 for chronic abdominal pain.  He has GI history significant for sigmoid stricture, bowel obstruction, diverticulitis, and perforated cecum in 2020 requiring resection of right colon and resection of sigmoid colon with ileocolonic and colorectal anastomosis.  Developed multiple ventral hernias s/p laparoscopic incisional hernia repair with mesh and lysis of adhesions 01/04/2021.  Longstanding GERD.Follows with pain management for chronic abdominal pain.  Taking tramadol.  Today: Presenting today with worsening abdominal pain, constipation.  Constipation has been bad over the past 1 week.  He does have baseline intermittent constipation since his GI surgery/reversal surgery.  He followed up with his surgeon last Friday.  Patient reports taking over-the-counter laxatives as needed.  More recently taking bisacodyl 10 mg without any results.  General surgeon, Dr. Sheliah Hatch, started him on Linzess 72 mcg daily.  Since on Linzess, he has had a couple of watery stools on the days he takes it.  He has developed some abdominal cramping, yesterday he did not take it.  He had some bright red blood per rectum a couple of days ago.  He also notes that his reflux has been worse, few episodes of vomiting in the past few weeks.  No dysphagia.  No hematemesis.   Recent medication changes include starting Mounjaro about a month ago, increased dose last week.   Seen in the ED last week with abdominal pain, CT was unremarkable, see  below.  Labs overall unremarkable.    CT A/P with contrast 06/23/2022 with enlarged liver and spleen, umbilical hernia containing fat. No acute abnormalities.    CT A/P with contrast 11/13/2022 (care everywhere) with no acute abnormalities, surgical changes involving the right colon and sigmoid colon,. Liver and spleen normal.   Abdominal ultrasound with elastography June 2024: IMPRESSION: ULTRASOUND ABDOMEN: - Unremarkable abdominal ultrasound examination. ULTRASOUND HEPATIC ELASTOGRAPHY: -Median kPa:  3.1 Diagnostic category:  < or = 5 kPa: high probability of being normal  CT abdomen pelvis with IV contrast October 2024: No acute findings.  Liver and spleen unremarkable.   Labs 11/13/2022: CMP essentially normal CBC remarkable for WBC 14.2, hemoglobin 17.1   Labs from March 2024: Blood cell count 9900, hemoglobin 14.7, platelets 192,000. Labs from February 2024: Glucose 319, albumin 2.7, total bilirubin 0.3, alk phos 88, AST 16, ALT 40, creatinine 0.85.   Colonoscopy 11/2022: -Nonbleeding internal hemorrhoid -Patent side-to-side colocolonic anastomosis, healthy appearing -Patent side-to-side ileocolonic anastomosis, healthy appearing -one 3 mm polyp in the descending colon removed, hyperplastic -one 7 mm polyp in the sigmoid colon removed, hyperplastic -No colon biopsies negative -Colonoscopy in 10 years  Wt Readings from Last 3 Encounters:  08/04/23 225 lb 12.8 oz (102.4 kg)  03/24/23 222 lb (100.7 kg)  03/02/23 221 lb 6.4 oz (100.4 kg)     Medications   Current Outpatient Medications  Medication Sig Dispense Refill   albuterol (VENTOLIN HFA) 108 (90 Base) MCG/ACT inhaler Inhale 2 puffs into the lungs every 6 (six) hours  as needed for wheezing or shortness of breath.     atorvastatin (LIPITOR) 40 MG tablet Take 1 tablet (40 mg total) by mouth daily. 90 tablet 3   Blood Glucose Monitoring Suppl (ONE TOUCH ULTRA 2) w/Device KIT Measure blood sugar daily and with meals for  insulin 1 kit 0   diphenhydrAMINE (BENADRYL) 25 mg capsule Take 25 mg by mouth every 6 (six) hours as needed for allergies.     diphenhydramine-acetaminophen (TYLENOL PM) 25-500 MG TABS tablet Take 3 tablets by mouth at bedtime as needed (sleep).     escitalopram (LEXAPRO) 10 MG tablet Take 10 mg by mouth daily as needed (anxiety).     famotidine (PEPCID) 20 MG tablet Take 1 tablet (20 mg total) by mouth daily as needed for heartburn or indigestion. 30 tablet 5   glipiZIDE (GLUCOTROL) 5 MG tablet Take 5 mg by mouth 2 (two) times daily.     glucose blood (ONETOUCH ULTRA) test strip Use as instructed to monitor glucose 4 times daily 100 each 12   HUMALOG 100 UNIT/ML injection inject 5-11 units into THE SKIN THREE TIMES DAILY with meals 30 mL 3   Lancets (ONETOUCH ULTRASOFT) lancets Use as instructed to monitor glucose 4 times daily 100 each 12   LANTUS 100 UNIT/ML injection INJECT 40 units into THE SKIN AT BEDTIME 30 mL 3   LANTUS SOLOSTAR 100 UNIT/ML Solostar Pen Inject 40 Units into the skin at bedtime.     LATUDA 60 MG TABS Take 60 mg by mouth at bedtime.     linaclotide (LINZESS) 72 MCG capsule Take by mouth.     methocarbamol (ROBAXIN) 500 MG tablet Take 250-500 mg by mouth 2 (two) times daily as needed for muscle spasms.     MOUNJARO 7.5 MG/0.5ML Pen as directed: 7.5MG  Subcutaneous ONCE WEEKLY for 30 days     omeprazole (PRILOSEC) 40 MG capsule Take 40 mg by mouth at bedtime.     Potassium 99 MG TABS Take 99 mg by mouth daily.     traMADol (ULTRAM) 50 MG tablet Take 50 mg by mouth 3 (three) times daily as needed for moderate pain.     traZODone (DESYREL) 100 MG tablet Take 100-200 mg by mouth at bedtime.     VYVANSE 20 MG capsule Take 20 mg by mouth daily with breakfast. Take with 50 mg for a total of 70 mg     VYVANSE 50 MG capsule Take 50 mg by mouth See admin instructions. Take with 20 mg for a total of 70 mg in the morning     No current facility-administered medications for this visit.     Allergies   Allergies as of 08/04/2023 - Review Complete 08/04/2023  Allergen Reaction Noted   Metformin and related Other (See Comments) 07/08/2016   Penicillins Anaphylaxis, Hives, Shortness Of Breath, and Swelling 10/14/2016   Nsaids Other (See Comments) 11/23/2020       Review of Systems   General: Negative for anorexia, weight loss, fever, chills, fatigue, weakness. ENT: Negative for hoarseness, difficulty swallowing , nasal congestion. CV: Negative for chest pain, angina, palpitations, dyspnea on exertion, peripheral edema.  Respiratory: Negative for dyspnea at rest, dyspnea on exertion, cough, sputum, wheezing.  GI: See history of present illness. GU:  Negative for dysuria, hematuria, urinary incontinence, urinary frequency, nocturnal urination.  Endo: Negative for unusual weight change.     Physical Exam   BP 106/74 (BP Location: Right Arm, Patient Position: Sitting, Cuff Size: Large)  Pulse (!) 105   Temp 98.3 F (36.8 C) (Oral)   Ht 5\' 7"  (1.702 m)   Wt 225 lb 12.8 oz (102.4 kg)   SpO2 98%   BMI 35.37 kg/m    General: Well-nourished, well-developed in no acute distress.  Eyes: No icterus. Mouth: Oropharyngeal mucosa moist and pink  Abdomen: Bowel sounds are normal, nondistended, no hepatosplenomegaly or masses,  no abdominal bruits or hernia , no rebound or guarding.  Mild upper abdominal tenderness Rectal: Not performed Extremities: No lower extremity edema. No clubbing or deformities. Neuro: Alert and oriented x 4   Skin: Warm and dry, no jaundice.   Psych: Alert and cooperative, normal mood and affect.  Labs   Labs from 07/26/2023: Alcohol level less than 3, troponin 6, lactate 1.3, lipase 58, urine drug screen negative, white blood cell count 10,500, hemoglobin 15.9, platelets 218,000, sodium 135, BUN 9, creatinine 0.94, glucose 361, albumin 3.8, total bilirubin 0.3, alkaline phosphatase 125, AST 20, ALT 51 Imaging Studies   No results  found.  Assessment/Plan:   GERD -Likely exacerbated due to delayed gastric emptying from use of Mounjaro -Continue omeprazole 40 mg daily before breakfast -Add Pepcid 20 mg daily as needed for breakthrough symptoms -reinforced antireflux measures -Consider EGD for Barrett's screening with his next colonoscopy at age 68  Chronic abdominal pain -Recent exacerbation in the setting of constipation -Continues to follow with pain clinic -Recent CT imaging reassuring  Constipation  -Recent worsening constipation in the setting of Mounjaro which was started about 4 weeks ago.  Just took increased dose in the past week -Pain medication contributing as well -Started Linzess 72 mcg once daily 3 days ago.  He took for 2 days and had a couple of watery stools and abdominal cramping but does not feel that his bowels adequately moved. -Samples of Linzess 290 mcg to take once daily on empty stomach provided to patient today.  Once he gets his bowels moving well he may be able to go back to his prescription which is 72 mcg daily.  He could take 2 of the capsules at 1 time if needed as well.  Hopefully will find the right dose for him and fine-tune his prescription. -Patient to send a message in a couple weeks and let us know how he is  Leanna Battles. Melvyn Neth, MHS, PA-C [facility]

## 2023-09-01 ENCOUNTER — Telehealth: Payer: Self-pay | Admitting: Nurse Practitioner

## 2023-09-01 ENCOUNTER — Ambulatory Visit: Payer: 59 | Admitting: Nurse Practitioner

## 2023-09-01 DIAGNOSIS — Z7984 Long term (current) use of oral hypoglycemic drugs: Secondary | ICD-10-CM

## 2023-09-01 DIAGNOSIS — Z794 Long term (current) use of insulin: Secondary | ICD-10-CM

## 2023-09-01 DIAGNOSIS — E559 Vitamin D deficiency, unspecified: Secondary | ICD-10-CM

## 2023-09-01 DIAGNOSIS — E782 Mixed hyperlipidemia: Secondary | ICD-10-CM

## 2023-09-01 NOTE — Telephone Encounter (Signed)
Mychart msg sent

## 2023-09-28 ENCOUNTER — Other Ambulatory Visit: Payer: Self-pay | Admitting: Nurse Practitioner

## 2023-10-05 ENCOUNTER — Emergency Department (HOSPITAL_COMMUNITY): Payer: 59

## 2023-10-05 ENCOUNTER — Encounter (HOSPITAL_COMMUNITY): Payer: Self-pay

## 2023-10-05 ENCOUNTER — Emergency Department (HOSPITAL_COMMUNITY)
Admission: EM | Admit: 2023-10-05 | Discharge: 2023-10-05 | Disposition: A | Discharge status: Home / Self Care | Payer: 59 | Attending: Emergency Medicine | Admitting: Emergency Medicine

## 2023-10-05 DIAGNOSIS — Z794 Long term (current) use of insulin: Secondary | ICD-10-CM | POA: Insufficient documentation

## 2023-10-05 DIAGNOSIS — R739 Hyperglycemia, unspecified: Secondary | ICD-10-CM

## 2023-10-05 DIAGNOSIS — Z7984 Long term (current) use of oral hypoglycemic drugs: Secondary | ICD-10-CM | POA: Insufficient documentation

## 2023-10-05 DIAGNOSIS — E1165 Type 2 diabetes mellitus with hyperglycemia: Secondary | ICD-10-CM | POA: Diagnosis not present

## 2023-10-05 DIAGNOSIS — R1032 Left lower quadrant pain: Secondary | ICD-10-CM | POA: Diagnosis present

## 2023-10-05 LAB — COMPREHENSIVE METABOLIC PANEL
ALT: 41 U/L (ref 0–44)
AST: 32 U/L (ref 15–41)
Albumin: 4.1 g/dL (ref 3.5–5.0)
Alkaline Phosphatase: 75 U/L (ref 38–126)
Anion gap: 11 (ref 5–15)
BUN: 7 mg/dL (ref 6–20)
CO2: 21 mmol/L — ABNORMAL LOW (ref 22–32)
Calcium: 9.4 mg/dL (ref 8.9–10.3)
Chloride: 98 mmol/L (ref 98–111)
Creatinine, Ser: 0.78 mg/dL (ref 0.61–1.24)
GFR, Estimated: >60 mL/min (ref >60)
Glucose, Bld: 396 mg/dL — ABNORMAL HIGH (ref 70–99)
Potassium: 3.9 mmol/L (ref 3.5–5.1)
Sodium: 130 mmol/L — ABNORMAL LOW (ref 135–145)
Total Bilirubin: 0.4 mg/dL (ref <1.2)
Total Protein: 7.8 g/dL (ref 6.5–8.1)

## 2023-10-05 LAB — CBC
HCT: 43.4 % (ref 39.0–52.0)
Hemoglobin: 15.1 g/dL (ref 13.0–17.0)
MCH: 28.4 pg (ref 26.0–34.0)
MCHC: 34.8 g/dL (ref 30.0–36.0)
MCV: 81.7 fL (ref 80.0–100.0)
Platelets: 195 10*3/uL (ref 150–400)
RBC: 5.31 MIL/uL (ref 4.22–5.81)
RDW: 13.7 % (ref 11.5–15.5)
WBC: 9.2 10*3/uL (ref 4.0–10.5)
nRBC: 0 % (ref 0.0–0.2)

## 2023-10-05 LAB — URINALYSIS, ROUTINE W REFLEX MICROSCOPIC
Bacteria, UA: NONE SEEN
Bilirubin Urine: NEGATIVE
Glucose, UA: >=500 mg/dL — AB
Hgb urine dipstick: NEGATIVE
Ketones, ur: NEGATIVE mg/dL
Leukocytes,Ua: NEGATIVE
Nitrite: NEGATIVE
Protein, ur: NEGATIVE mg/dL
Specific Gravity, Urine: 1.033 — ABNORMAL HIGH (ref 1.005–1.030)
pH: 5 (ref 5.0–8.0)

## 2023-10-05 LAB — CBG MONITORING, ED
Glucose-Capillary: 411 mg/dL — ABNORMAL HIGH (ref 70–99)
Glucose-Capillary: 281 mg/dL — ABNORMAL HIGH (ref 70–99)

## 2023-10-05 LAB — LIPASE, BLOOD: Lipase: 45 U/L (ref 11–51)

## 2023-10-05 MED ORDER — GLIPIZIDE 5 MG PO TABS: 5.0000 mg | ORAL_TABLET | Freq: Two times a day (BID) | ORAL | 0 refills | Status: DC

## 2023-10-05 MED ORDER — MORPHINE SULFATE (PF) 4 MG/ML IV SOLN
4.0000 mg | Freq: Once | INTRAVENOUS | Status: AC
  Administered 2023-10-05: 4 mg via INTRAVENOUS
  Filled 2023-10-05: qty 1

## 2023-10-05 MED ORDER — IOHEXOL 300 MG/ML  SOLN
100.0000 mL | Freq: Once | INTRAMUSCULAR | Status: AC | PRN
  Administered 2023-10-05: 100 mL via INTRAVENOUS

## 2023-10-05 MED ORDER — ONDANSETRON HCL 4 MG/2ML IJ SOLN
4.0000 mg | Freq: Once | INTRAMUSCULAR | Status: AC
  Administered 2023-10-05: 4 mg via INTRAVENOUS
  Filled 2023-10-05: qty 2

## 2023-10-05 MED ORDER — LACTATED RINGERS IV BOLUS
1000.0000 mL | Freq: Once | INTRAVENOUS | Status: AC
  Administered 2023-10-05: 1000 mL via INTRAVENOUS

## 2023-10-05 NOTE — ED Provider Notes (Signed)
[place] EMERGENCY DEPARTMENT AT [facility] Provider Note   CSN: [id number] Arrival date & time: 10/05/23  1121     History  Chief Complaint  Patient presents with   Abdominal Pain    [name] is a 42 y.o. male patient with past medical history of alcohol abuse, bipolar disorder, type 2 diabetes, noncompliance reporting to emergency room with complaint of left lower quadrant pain pain is a 10 out of 10.  This started last night when he was walking around.  He has reported 2 days of decreased appetite but he has been able to eat and drink.  He does report increased thirst and frequent urination.  Reports been out of his medication, denies nausea vomiting diarrhea fever.  Denies any blood in his stool.  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 Reports he is passing gas. Denies drug or EtOH use.    Abdominal Pain      Home Medications Prior to Admission medications   Medication Sig Start Date End Date Taking? Authorizing Provider  albuterol (VENTOLIN HFA) 108 (90 Base) MCG/ACT inhaler Inhale 2 puffs into the lungs every 6 (six) hours as needed for wheezing or shortness of breath.    [provider]  atorvastatin (LIPITOR) 40 MG tablet Take 1 tablet (40 mg total) by mouth daily. 08/22/21   Cora Collum, DO  Blood Glucose Monitoring Suppl (ONE TOUCH ULTRA 2) w/Device KIT Measure blood sugar daily and with meals for insulin 10/22/21   Dani Gobble, NP  diphenhydrAMINE (BENADRYL) 25 mg capsule Take 25 mg by mouth every 6 (six) hours as needed for allergies. 11/05/22   [provider]  diphenhydramine-acetaminophen (TYLENOL PM) 25-500 MG TABS tablet Take 3 tablets by mouth at bedtime as needed (sleep).    [provider]  escitalopram (LEXAPRO) 10 MG tablet Take 10 mg by mouth daily as needed (anxiety).    [provider]  famotidine (PEPCID) 20 MG tablet Take 1 tablet (20 mg total) by mouth daily as needed  for heartburn or indigestion. 08/04/23   Tiffany Kocher, PA-C  glipiZIDE (GLUCOTROL) 5 MG tablet Take 5 mg by mouth 2 (two) times daily.    [provider]  glucose blood (ONETOUCH ULTRA) test strip Use as instructed to monitor glucose 4 times daily 10/22/21   Dani Gobble, NP  HUMALOG 100 UNIT/ML injection inject 5-11 units into THE SKIN THREE TIMES DAILY with meals 03/11/23   Dani Gobble, NP  Lancets [facility] ULTRASOFT) lancets Use as instructed to monitor glucose 4 times daily 10/22/21   Dani Gobble, NP  LANTUS 100 UNIT/ML injection INJECT 40 units into THE SKIN AT BEDTIME 03/11/23   Dani Gobble, NP  LANTUS SOLOSTAR 100 UNIT/ML Solostar Pen Inject 40 Units into the skin at bedtime.    [provider]  LATUDA 60 MG TABS Take 60 mg by mouth at bedtime. 09/18/21   [provider]  linaclotide Karlene Einstein) 72 MCG capsule Take by mouth. 07/31/23   [provider]  methocarbamol (ROBAXIN) 500 MG tablet Take 250-500 mg by mouth 2 (two) times daily as needed for muscle spasms. 11/21/22   [provider]  MOUNJARO 7.5 MG/0.5ML Pen as directed: 7.5MG  Subcutaneous ONCE WEEKLY for 30 days    [provider]  omeprazole (PRILOSEC) 40 MG capsule Take 40 mg by mouth at bedtime.    [provider]  Potassium 99 MG TABS Take 99 mg by  mouth daily.    [provider]  traMADol (ULTRAM) 50 MG tablet Take 50 mg by mouth 3 (three) times daily as needed for moderate pain. 12/30/21   [provider]  traZODone (DESYREL) 100 MG tablet Take 100-200 mg by mouth at bedtime. 12/05/19   [provider]  VYVANSE 20 MG capsule Take 20 mg by mouth daily with breakfast. Take with 50 mg for a total of 70 mg 06/28/18   [provider]  VYVANSE 50 MG capsule Take 50 mg by mouth See admin instructions. Take with 20 mg for a total of 70 mg in the morning 06/28/18   [provider]      Allergies    Metformin and  related, Penicillins, and Nsaids    Review of Systems   Review of Systems  Gastrointestinal:  Positive for abdominal pain.    Physical Exam Updated Vital Signs BP (!) 146/82   Pulse 99   Temp 99 F (37.2 C) (Oral)   Resp 16   Ht 5\' 7"  (1.702 m)   Wt 103 kg   SpO2 97%   BMI 35.55 kg/m  Physical Exam  ED Results / Procedures / Treatments   Labs (all labs ordered are listed, but only abnormal results are displayed) Labs Reviewed  COMPREHENSIVE METABOLIC PANEL - Abnormal; Notable for the following components:      Result Value   Sodium 130 (*)    CO2 21 (*)    Glucose, Bld 396 (*)    All other components within normal limits  CBG MONITORING, ED - Abnormal; Notable for the following components:   Glucose-Capillary 411 (*)    All other components within normal limits  LIPASE, BLOOD  CBC  URINALYSIS, ROUTINE W REFLEX MICROSCOPIC    EKG None  Radiology CT ABDOMEN PELVIS W CONTRAST Result Date: 10/05/2023 CLINICAL DATA:  Left lower quadrant pain. EXAM: CT ABDOMEN AND PELVIS WITH CONTRAST TECHNIQUE: Multidetector CT imaging of the abdomen and pelvis was performed using the standard protocol following bolus administration of intravenous contrast. RADIATION DOSE REDUCTION: This exam was performed according to the departmental dose-optimization program which includes automated exposure control, adjustment of the mA and/or kV according to patient size and/or use of iterative reconstruction technique. CONTRAST:  OMNIPAQUE IOHEXOL 300 MG/ML  SOLN COMPARISON:  July 26, 2023 FINDINGS: Lower chest: No acute abnormality. Hepatobiliary: No focal liver abnormality is seen. No gallstones, gallbladder wall thickening, or biliary dilatation. Pancreas: Unremarkable. No pancreatic ductal dilatation or surrounding inflammatory changes. Spleen: Normal in size without focal abnormality. Adrenals/Urinary Tract: Adrenal glands are unremarkable. Kidneys are normal in size, without renal calculi,  focal lesion, or hydronephrosis. Mild, stable nonspecific bilateral perinephric inflammatory fat stranding is seen. Bladder is unremarkable. Stomach/Bowel: Stomach is within normal limits. The appendix is not identified and may be surgically absent. Surgically anastomosed bowel is seen within the region of the mid to distal sigmoid colon. No evidence of bowel wall thickening, distention, or inflammatory changes. Vascular/Lymphatic: Aortic atherosclerosis. A retroaortic left renal vein is noted. No enlarged abdominal or pelvic lymph nodes. Reproductive: The prostate gland is normal in size. Mild prostate gland calcification is seen. Other: There is evidence of a prior right-sided ostomy site. No abdominopelvic ascites. Musculoskeletal: No acute or significant osseous findings. IMPRESSION: 1. No acute or active process within the abdomen or pelvis. 2. Evidence of prior bowel surgery. 3. Aortic atherosclerosis. Aortic Atherosclerosis (ICD10-I70.0). Electronically Signed   By: Aram Candela M.D.   On: 10/05/2023  15:01    Procedures Procedures    Medications Ordered in ED Medications - No data to display  ED Course/ Medical Decision Making/ A&P                                 Medical Decision Making Amount and/or Complexity of Data Reviewed Labs: ordered. Radiology: ordered.  Risk Prescription drug management.   Macson Hinger Mader 42 y.o. presented today for abd pain. Working DDx includes, but not limited to, gastroenteritis, colitis, SBO, appendicitis, cholecystitis, hepatobiliary pathology, gastritis, PUD, ACS, dissection, pancreatitis, nephrolithiasis, AAA, UTI, pyelonephritis, ruptured ectopic pregnancy, PID, ovarian torsion.  R/o DDx: These are considered less likely than current impression due to history of present illness, physical exam, labs/imaging findings.  Review of prior external notes: 07/26/2023 while patient was evaluated for similar thing.  Pmhx: alcohol abuse, bipolar disorder,  type 2 diabetes, noncompliance   Unique Tests and My Interpretation:  CBC with elevated glucose, no anion gap or electrolyte abnormality.  Urinalysis without ketones, does have a amount of glucose.  Lipase within normal limits, CBC without leukocytosis, no anemia.  Repeat CBG 281.  Patient has been out of his diabetes medication, recent.   Imaging:  CT Abd/Pelvis with contrast: evaluate for structural/surgical etiology of patients' severe abdominal pain -no acute intra-abdominal pathology  Problem List / ED Course / Critical interventions / Medication management  Reporting to emergency room today with left lower quadrant pain.  Given the patient has history of prior recent surgery and severity of symptoms ordered CT scan.  CT scan without acute intra-abdominal pathology.  Patient reports that his pain started when he was up walking around and is worse with movement but suspect he might have muscular component.  Recommended patient Tylenol for pain.  Patient also reports he needs refill on medication, he reports follow-up with primary care tomorrow and endocrinology follow-up in 2 days.  I have sent a short course of his medication however encouraged further refills come from primary care to closely monitor symptoms.  Patient's symptoms are improved he has been tolerating p.o.  Hemodynamically stable well-appearing I feel he stable for discharge and close PCP f/u. I ordered medication including morphine, zofran, NS Reevaluation of the patient after these medicines showed that the patient improved Patients vitals assessed. Upon arrival patient is hemodynamically stable.  I have reviewed the patients home medicines and have made adjustments as needed   Consult: None  Plan: Follow-up with primary care doctor tomorrow as discussed.  Make sure he stay well-hydrated with water, try to avoid foods high in sugar.  Patient stable for discharge all questions answered.  Medication refill  sent.         Final Clinical Impression(s) / ED Diagnoses Final diagnoses:  Hyperglycemia    Rx / DC Orders ED Discharge Orders     None         Smitty Knudsen, PA-C 10/05/23 1558    Gloris Manchester, MD 10/05/23 [phone]

## 2023-10-05 NOTE — ED Triage Notes (Signed)
Pt c/o LLQ cramping starting last night.  Pain score 10/10.  Pt reports taking OTC medication w/o relief.  Denies n/v/d.   Pt is non-compliant w/ DM2.  Sts he is out of his medication.

## 2023-10-05 NOTE — Discharge Instructions (Addendum)
You were seen in the emergency room today for abdominal pain.  Please try bland diet for the next 2 days.  I also recommend staying well-hydrated, I encourage you to drink water and avoid sugary drinks.  Please follow-up with primary care tomorrow as discussed.  Return to emergency room for new or worsening symptoms.

## 2023-10-08 ENCOUNTER — Ambulatory Visit: Payer: 59 | Admitting: Nurse Practitioner

## 2023-10-08 DIAGNOSIS — E782 Mixed hyperlipidemia: Secondary | ICD-10-CM

## 2023-10-08 DIAGNOSIS — E559 Vitamin D deficiency, unspecified: Secondary | ICD-10-CM

## 2023-10-08 DIAGNOSIS — E1165 Type 2 diabetes mellitus with hyperglycemia: Secondary | ICD-10-CM

## 2023-10-15 ENCOUNTER — Other Ambulatory Visit: Payer: Self-pay

## 2023-10-15 ENCOUNTER — Emergency Department (HOSPITAL_COMMUNITY)
Admission: EM | Admit: 2023-10-15 | Discharge: 2023-10-15 | Disposition: A | Discharge status: Home / Self Care | Payer: 59 | Attending: Emergency Medicine | Admitting: Emergency Medicine

## 2023-10-15 ENCOUNTER — Encounter (HOSPITAL_COMMUNITY): Payer: Self-pay | Admitting: Emergency Medicine

## 2023-10-15 DIAGNOSIS — R059 Cough, unspecified: Secondary | ICD-10-CM | POA: Diagnosis not present

## 2023-10-15 DIAGNOSIS — E119 Type 2 diabetes mellitus without complications: Secondary | ICD-10-CM | POA: Insufficient documentation

## 2023-10-15 DIAGNOSIS — R1032 Left lower quadrant pain: Secondary | ICD-10-CM | POA: Insufficient documentation

## 2023-10-15 DIAGNOSIS — H6993 Unspecified Eustachian tube disorder, bilateral: Secondary | ICD-10-CM | POA: Insufficient documentation

## 2023-10-15 DIAGNOSIS — R0981 Nasal congestion: Secondary | ICD-10-CM | POA: Insufficient documentation

## 2023-10-15 LAB — COMPREHENSIVE METABOLIC PANEL
ALT: 41 U/L (ref 0–44)
AST: 32 U/L (ref 15–41)
Albumin: 4 g/dL (ref 3.5–5.0)
Alkaline Phosphatase: 75 U/L (ref 38–126)
Anion gap: 8 (ref 5–15)
BUN: 11 mg/dL (ref 6–20)
CO2: 25 mmol/L (ref 22–32)
Calcium: 9 mg/dL (ref 8.9–10.3)
Chloride: 101 mmol/L (ref 98–111)
Creatinine, Ser: 0.83 mg/dL (ref 0.61–1.24)
GFR, Estimated: >60 mL/min (ref >60)
Glucose, Bld: 338 mg/dL — ABNORMAL HIGH (ref 70–99)
Potassium: 4.5 mmol/L (ref 3.5–5.1)
Sodium: 134 mmol/L — ABNORMAL LOW (ref 135–145)
Total Bilirubin: 0.6 mg/dL (ref <1.2)
Total Protein: 7.4 g/dL (ref 6.5–8.1)

## 2023-10-15 LAB — RESP PANEL BY RT-PCR (RSV, FLU A&B, COVID)  RVPGX2
Influenza A by PCR: NEGATIVE
Influenza B by PCR: NEGATIVE
Resp Syncytial Virus by PCR: NEGATIVE
SARS Coronavirus 2 by RT PCR: NEGATIVE

## 2023-10-15 LAB — URINALYSIS, ROUTINE W REFLEX MICROSCOPIC
Bacteria, UA: NONE SEEN
Bilirubin Urine: NEGATIVE
Glucose, UA: >=500 mg/dL — AB
Hgb urine dipstick: NEGATIVE
Ketones, ur: NEGATIVE mg/dL
Leukocytes,Ua: NEGATIVE
Nitrite: NEGATIVE
Protein, ur: NEGATIVE mg/dL
Specific Gravity, Urine: 1.041 — ABNORMAL HIGH (ref 1.005–1.030)
pH: 6 (ref 5.0–8.0)

## 2023-10-15 LAB — CBC WITH DIFFERENTIAL/PLATELET
Abs Immature Granulocytes: 0.03 10*3/uL (ref 0.00–0.07)
Basophils Absolute: 0.1 10*3/uL (ref 0.0–0.1)
Basophils Relative: 1 %
Eosinophils Absolute: 0.3 10*3/uL (ref 0.0–0.5)
Eosinophils Relative: 3 %
HCT: 43.9 % (ref 39.0–52.0)
Hemoglobin: 14.9 g/dL (ref 13.0–17.0)
Immature Granulocytes: 0 %
Lymphocytes Relative: 30 %
Lymphs Abs: 2.9 10*3/uL (ref 0.7–4.0)
MCH: 28.6 pg (ref 26.0–34.0)
MCHC: 33.9 g/dL (ref 30.0–36.0)
MCV: 84.3 fL (ref 80.0–100.0)
Monocytes Absolute: 0.6 10*3/uL (ref 0.1–1.0)
Monocytes Relative: 6 %
Neutro Abs: 5.9 10*3/uL (ref 1.7–7.7)
Neutrophils Relative %: 60 %
Platelets: 166 10*3/uL (ref 150–400)
RBC: 5.21 MIL/uL (ref 4.22–5.81)
RDW: 14 % (ref 11.5–15.5)
WBC: 9.7 10*3/uL (ref 4.0–10.5)
nRBC: 0 % (ref 0.0–0.2)

## 2023-10-15 LAB — LIPASE, BLOOD: Lipase: 44 U/L (ref 11–51)

## 2023-10-15 MED ORDER — FLUTICASONE PROPIONATE 50 MCG/ACT NA SUSP: 2.0000 | Freq: Every day | NASAL | 0 refills | Status: AC

## 2023-10-15 MED ORDER — DICYCLOMINE HCL 10 MG PO CAPS
10.0000 mg | ORAL_CAPSULE | Freq: Once | ORAL | Status: AC
  Administered 2023-10-15: 10 mg via ORAL
  Filled 2023-10-15: qty 1

## 2023-10-15 MED ORDER — ACETAMINOPHEN 325 MG PO TABS
650.0000 mg | ORAL_TABLET | Freq: Once | ORAL | Status: AC
  Administered 2023-10-15: 650 mg via ORAL
  Filled 2023-10-15: qty 2

## 2023-10-15 NOTE — ED Triage Notes (Signed)
Pt reports generalized body aches, left ear pain, and headache that started today.

## 2023-10-15 NOTE — ED Provider Notes (Signed)
[place] EMERGENCY DEPARTMENT AT [facility] Provider Note   CSN: [id number] Arrival date & time: 10/15/23  7829     History  Chief Complaint  Patient presents with   Generalized Body Aches    [name] is a 42 y.o. male.  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  Presents ER complaining of since last night has had bodyaches all over with left lower quadrant pain that goes into his back.  Admits to urinary frequency but no dysuria, no chest pain or shortness of breath, no nausea or vomiting.    She also reports last night developed cough, congestion and left ear pain.  Denies fever or chills, no chest pain or shortness of breath.  No sick contacts.  HPI     Home Medications Prior to Admission medications   Medication Sig Start Date End Date Taking? Authorizing Provider  fluticasone (FLONASE) 50 MCG/ACT nasal spray Place 2 sprays into both nostrils daily. 10/15/23  Yes Louie Meaders A, PA-C  albuterol (VENTOLIN HFA) 108 (90 Base) MCG/ACT inhaler Inhale 2 puffs into the lungs every 6 (six) hours as needed for wheezing or shortness of breath.    [provider]  atorvastatin (LIPITOR) 40 MG tablet Take 1 tablet (40 mg total) by mouth daily. 08/22/21   Cora Collum, DO  Blood Glucose Monitoring Suppl (ONE TOUCH ULTRA 2) w/Device KIT Measure blood sugar daily and with meals for insulin 10/22/21   Dani Gobble, NP  diphenhydrAMINE (BENADRYL) 25 mg capsule Take 25 mg by mouth every 6 (six) hours as needed for allergies. 11/05/22   [provider]  diphenhydramine-acetaminophen (TYLENOL PM) 25-500 MG TABS tablet Take 3 tablets by mouth at bedtime as needed (sleep).    [provider]  escitalopram (LEXAPRO) 10 MG tablet Take 10 mg by mouth daily as needed (anxiety).    [provider]  famotidine (PEPCID) 20 MG tablet Take 1 tablet (20 mg  total) by mouth daily as needed for heartburn or indigestion. 08/04/23   Tiffany Kocher, PA-C  glipiZIDE (GLUCOTROL) 5 MG tablet Take 1 tablet (5 mg total) by mouth 2 (two) times daily. 10/05/23   Barrett, Horald Chestnut, PA-C  glucose blood (ONETOUCH ULTRA) test strip Use as instructed to monitor glucose 4 times daily 10/22/21   Dani Gobble, NP  HUMALOG 100 UNIT/ML injection inject 5-11 units into THE SKIN THREE TIMES DAILY with meals 03/11/23   Dani Gobble, NP  Lancets [facility] ULTRASOFT) lancets Use as instructed to monitor glucose 4 times daily 10/22/21   Dani Gobble, NP  LANTUS 100 UNIT/ML injection INJECT 40 units into THE SKIN AT BEDTIME 03/11/23   Dani Gobble, NP  LANTUS SOLOSTAR 100 UNIT/ML Solostar Pen Inject 40 Units into the skin at bedtime.    [provider]  LATUDA 60 MG TABS Take 60 mg by mouth at bedtime. 09/18/21   [provider]  linaclotide Karlene Einstein) 72 MCG capsule Take by mouth. 07/31/23   [provider]  methocarbamol (ROBAXIN) 500 MG tablet Take 250-500 mg by mouth 2 (two) times daily as needed for muscle spasms. 11/21/22   [provider]  MOUNJARO 7.5 MG/0.5ML Pen as directed: 7.5MG  Subcutaneous ONCE WEEKLY for 30 days    [provider]  omeprazole (PRILOSEC) 40 MG capsule Take 40 mg by mouth at bedtime.    [provider]  Potassium 99  MG TABS Take 99 mg by mouth daily.    [provider]  traMADol (ULTRAM) 50 MG tablet Take 50 mg by mouth 3 (three) times daily as needed for moderate pain. 12/30/21   [provider]  traZODone (DESYREL) 100 MG tablet Take 100-200 mg by mouth at bedtime. 12/05/19   [provider]  VYVANSE 20 MG capsule Take 20 mg by mouth daily with breakfast. Take with 50 mg for a total of 70 mg 06/28/18   [provider]  VYVANSE 50 MG capsule Take 50 mg by mouth See admin instructions. Take with 20 mg for a total of 70 mg in the morning 06/28/18    [provider]      Allergies    Metformin and related, Penicillins, and Nsaids    Review of Systems   Review of Systems  Physical Exam Updated Vital Signs BP (!) 141/78 (BP Location: Right Arm)   Pulse 99   Temp 97.9 F (36.6 C) (Oral)   Resp 18   SpO2 97%  Physical Exam Vitals and nursing note reviewed.  Constitutional:      General: He is not in acute distress.    Appearance: He is well-developed.  HENT:     Head: Normocephalic and atraumatic.  Eyes:     Conjunctiva/sclera: Conjunctivae normal.  Cardiovascular:     Rate and Rhythm: Normal rate and regular rhythm.     Heart sounds: No murmur heard. Pulmonary:     Effort: Pulmonary effort is normal. No respiratory distress.     Breath sounds: Normal breath sounds.  Abdominal:     Palpations: Abdomen is soft.     Tenderness: There is no abdominal tenderness.  Musculoskeletal:        General: No swelling.     Cervical back: Neck supple.  Skin:    General: Skin is warm and dry.     Capillary Refill: Capillary refill takes less than 2 seconds.  Neurological:     General: No focal deficit present.     Mental Status: He is alert and oriented to person, place, and time.  Psychiatric:        Mood and Affect: Mood normal.     ED Results / Procedures / Treatments   Labs (all labs ordered are listed, but only abnormal results are displayed) Labs Reviewed  COMPREHENSIVE METABOLIC PANEL - Abnormal; Notable for the following components:      Result Value   Sodium 134 (*)    Glucose, Bld 338 (*)    All other components within normal limits  URINALYSIS, ROUTINE W REFLEX MICROSCOPIC - Abnormal; Notable for the following components:   Specific Gravity, Urine 1.041 (*)    Glucose, UA >=500 (*)    All other components within normal limits  RESP PANEL BY RT-PCR (RSV, FLU A&B, COVID)  [id number]  CBC WITH DIFFERENTIAL/PLATELET  LIPASE, BLOOD    EKG None  Radiology No results found.  Procedures Procedures     Medications Ordered in ED Medications  acetaminophen (TYLENOL) tablet 650 mg (650 mg Oral Given 10/15/23 1048)  dicyclomine (BENTYL) capsule 10 mg (10 mg Oral Given 10/15/23 1048)    ED Course/ Medical Decision Making/ A&P Clinical Course as of 10/15/23 1358  Thu Oct 15, 2023  1042 Patient presents with complaint of URI symptoms and bodyaches since last night, negative COVID flu RSV, no fevers, normal vitals.  Discussed with patient likely viral illness.  He does have some left ear pain  with small effusion. Patient also having left lower quadrant pain radiating to his back.  Given his history of bowel obstruction, colon resection, hernia repair will obtain labs.  He had a normal CT scan in October for left lower quadrant pain.  He also had a normal CT scan 10 days ago for the same complaint.  He had a normal colonoscopy 10 months ago by Dr. Marletta Lor.  His abdomen is soft with mild tenderness.  Will evaluate labs and decide on imaging with shared decision making with patient [CB]  1357 Pain improved after Tylenol, in light of multiple recent normal CTs no need for further imaging of his abdomen pelvis refer back to his GI doctor.  Given his ear pain he does have some eustachian tube dysfunction with bilateral effusions but no redness, no bulging.  Advised Flonase and PCP follow-up, he was given strict return precautions. [CB]    Clinical Course User Index [CB] Ma Rings, PA-C                                 Medical Decision Making Amount and/or Complexity of Data Reviewed External Data Reviewed: labs and notes. Labs: ordered.    Details: And hyperglycemic but is around his usual baseline, no leukocytosis, reassuring renal and hepatic function, normal lipase, no UTI  Risk OTC drugs. Prescription drug management.           Final Clinical Impression(s) / ED Diagnoses Final diagnoses:  Left lower quadrant abdominal pain  Dysfunction of both eustachian tubes    Rx /  DC Orders ED Discharge Orders          Ordered    fluticasone (FLONASE) 50 MCG/ACT nasal spray  Daily        10/15/23 1251              Ma Rings, PA-C 10/15/23 1359    Cathren Laine, MD 10/15/23 1654

## 2023-10-15 NOTE — Discharge Instructions (Signed)
Was a pleasure taking care of you today.  Seen for body aches and chills along with abdominal pain.

## 2023-10-28 ENCOUNTER — Ambulatory Visit: Payer: 59 | Admitting: Nurse Practitioner

## 2023-10-28 DIAGNOSIS — E782 Mixed hyperlipidemia: Secondary | ICD-10-CM

## 2023-10-28 DIAGNOSIS — E559 Vitamin D deficiency, unspecified: Secondary | ICD-10-CM

## 2023-10-28 DIAGNOSIS — Z7984 Long term (current) use of oral hypoglycemic drugs: Secondary | ICD-10-CM

## 2023-10-28 DIAGNOSIS — Z794 Long term (current) use of insulin: Secondary | ICD-10-CM

## 2023-10-28 DIAGNOSIS — Z7985 Long-term (current) use of injectable non-insulin antidiabetic drugs: Secondary | ICD-10-CM

## 2023-11-03 ENCOUNTER — Ambulatory Visit: Payer: 59 | Admitting: Neurology

## 2023-11-30 ENCOUNTER — Encounter: Payer: Self-pay | Admitting: Neurology

## 2023-11-30 ENCOUNTER — Ambulatory Visit (INDEPENDENT_AMBULATORY_CARE_PROVIDER_SITE_OTHER): Payer: 59 | Admitting: Neurology

## 2023-11-30 VITALS — BP 148/92 | HR 102 | Ht 67.0 in | Wt 229.0 lb

## 2023-11-30 DIAGNOSIS — E1142 Type 2 diabetes mellitus with diabetic polyneuropathy: Secondary | ICD-10-CM | POA: Diagnosis not present

## 2023-11-30 DIAGNOSIS — Z7984 Long term (current) use of oral hypoglycemic drugs: Secondary | ICD-10-CM | POA: Diagnosis not present

## 2023-11-30 DIAGNOSIS — R202 Paresthesia of skin: Secondary | ICD-10-CM | POA: Diagnosis not present

## 2023-11-30 DIAGNOSIS — M792 Neuralgia and neuritis, unspecified: Secondary | ICD-10-CM | POA: Diagnosis not present

## 2023-11-30 MED ORDER — GABAPENTIN 300 MG PO CAPS: 300.0000 mg | ORAL_CAPSULE | Freq: Three times a day (TID) | ORAL | 11 refills | Status: DC

## 2023-11-30 NOTE — Progress Notes (Signed)
 Chief Complaint  Patient presents with   New Patient (Initial Visit)    Rm14, wife present, referral for muscular dystrophy - [facility]:also mentioned nerve damage in legs causing pain      ASSESSMENT AND PLAN  [name] is a 43 y.o. 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, mother has muscular disease, per patient diagnosis was confirmed by positive muscle biopsy, no significant limb muscle weakness on examination  Paresthesia  Long history of poorly controlled nondependent diabetes, A1c was 10-11 range, length-dependent sensory changes, decreased pinprick at right finger pads, most consistent with diabetic peripheral neuropathy, right carpal tunnel syndrome,  Laboratory evaluations to rule out other treatable etiology  Suggested wrist splint  Gabapentin  300 mg up to 3 times a day  Follow-up with primary care physician, emphasized importance of optimize diabetic control, only return to clinic if above workup showed significant abnormalities  DIAGNOSTIC DATA (LABS, IMAGING, TESTING) - I reviewed patient records, labs, notes, testing and imaging myself where available.   MEDICAL HISTORY:  [name] is a 43 year old male, accompanied by his wife, seen in request by [facility] NP  Bucio, Elsa C for evaluation of bilateral feet and hands paresthesia initial evaluation November 30, 2023    History is obtained from the patient and review of electronic medical records. I personally reviewed pertinent available imaging films in PACS.   PMHx of  DM-since 2018, insulin  dependent ADHD HLD GERD Bipolar GERD Becker's muscular dystrophy Smoke 1ppd Colectomy OSA-could not tolerate CPAP  He carried a diagnosis of Becker's muscular dystrophy, was seen by clinic in 2015, but there was no muscle weakness documented, also have a history of migraine headaches, obstructive sleep apnea but could not tolerate the CPAP.  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 [address], on disability,  Had more than 7 years history of diabetes, insulin -dependent, not under good control, A1c has been in the range of 10-11 range,  Since 2023, he noticed numbness tingling of bilateral feet, gradually getting worse, some stinging pain, gabapentin  was helpful, but no longer on it, he also developed intermittent bilateral hands paresthesia, mostly right first 4 fingers, denies significant muscle weakness   Laboratory evaluation from December 2023, A1c was 11.7, hemoglobin of 15.5, normal CMP other than elevated glucose of 328, vitamin D  level was 17,  PHYSICAL EXAM:   Vitals:   11/30/23 1340  BP: (!) 148/92  Pulse: (!) 102  Weight: 229 lb (103.9 kg)  Height: 5\' 7"  (1.702 m)    Body mass index is 35.87 kg/m.  PHYSICAL EXAMNIATION:  Gen: NAD, conversant, well nourised, well groomed                     Cardiovascular: Regular rate rhythm, no peripheral edema, warm, nontender. Eyes: Conjunctivae clear without exudates or hemorrhage Neck: Supple, no carotid bruits. Pulmonary: Clear to auscultation bilaterally   NEUROLOGICAL EXAM:  MENTAL STATUS: Speech/cognition: Unkempt,  facial piercing, awake, alert, oriented to history taking and casual conversation CRANIAL NERVES: CN II: Visual fields are full to confrontation. Pupils are round equal and briskly reactive to light. CN III, IV, VI: extraocular movement are normal. No ptosis. CN  V: Facial sensation is intact to light touch CN VII: Face is symmetric with normal eye closure  CN VIII: Hearing is normal to causal conversation. CN IX, X: Phonation is normal. CN XI: Head  turning and shoulder shrug are intact  MOTOR: There is no pronator drift of out-stretched arms. Muscle bulk and tone are normal. Muscle strength is normal.  Mild enlarged calf bilaterally  REFLEXES: Reflexes are 1  and symmetric at the biceps, triceps, knees, and absent at ankles. Plantar responses are flexor.  SENSORY: Length-dependent decreased vibratory sensation, light touch, pinprick to ankle level, decreased pinprick at right first 4 fingerpads  COORDINATION: There is no trunk or limb dysmetria noted.  GAIT/STANCE: Able to get up from seated position arm crossed, steady,  REVIEW OF SYSTEMS:  Full 14 system review of systems performed and notable only for as above All other review of systems were negative.   ALLERGIES: Allergies  Allergen Reactions   Metformin  And Related Other (See Comments)    Stomach cramps,  Throat closing sensation, could not swallow   Penicillins Anaphylaxis, Hives, Shortness Of Breath and Swelling    Did it involve swelling of the face/tongue/throat, SOB, or low BP? Yes Did it involve sudden or severe rash/hives, skin peeling, or any reaction on the inside of your mouth or nose? Unk Did you need to seek medical attention at a hospital or doctor's office? No When did it last happen? Unk  If all above answers are "NO", may proceed with cephalosporin use. Got ceftriaxone  before    Nsaids Other (See Comments)    States he is not supposed to take nsaids because of his colon      HOME MEDICATIONS: Current Outpatient Medications  Medication Sig Dispense Refill   albuterol  (VENTOLIN  HFA) 108 (90 Base) MCG/ACT inhaler Inhale 2 puffs into the lungs every 6 (six) hours as needed for wheezing or shortness of breath.     atorvastatin  (LIPITOR) 40 MG tablet Take 1 tablet (40 mg total) by mouth daily. 90 tablet 3   Blood Glucose Monitoring Suppl (ONE TOUCH ULTRA 2) w/Device KIT Measure blood sugar daily and with meals for insulin  1 kit 0   diphenhydrAMINE   (BENADRYL ) 25 mg capsule Take 25 mg by mouth every 6 (six) hours as needed for allergies.     diphenhydramine -acetaminophen  (TYLENOL  PM) 25-500 MG TABS tablet Take 3 tablets by mouth at bedtime as needed (sleep).     escitalopram (LEXAPRO) 10 MG tablet Take 10 mg by mouth daily as needed (anxiety).     famotidine  (PEPCID ) 20 MG tablet Take 1 tablet (20 mg total) by mouth daily as needed for heartburn or indigestion. 30 tablet 5   fluticasone  (FLONASE ) 50 MCG/ACT nasal spray Place 2 sprays into both nostrils daily. 11.1 mL 0   glipiZIDE  (GLUCOTROL ) 5 MG tablet Take 1 tablet (5 mg total) by mouth 2 (two) times daily. 30 tablet 0   glucose blood (ONETOUCH ULTRA) test strip Use as instructed to monitor glucose 4 times daily 100 each 12   HUMALOG  100 UNIT/ML injection inject 5-11 units into THE SKIN THREE TIMES DAILY with meals 30 mL 3   Lancets (ONETOUCH ULTRASOFT) lancets Use as instructed to monitor glucose 4 times daily 100 each 12   LANTUS  100 UNIT/ML injection INJECT 40 units into THE SKIN AT BEDTIME 30 mL 3   LANTUS  SOLOSTAR 100 UNIT/ML Solostar Pen Inject 40 Units into the skin at bedtime.     LATUDA  60 MG TABS Take 60  mg by mouth at bedtime.     linaclotide (LINZESS) 72 MCG capsule Take by mouth.     methocarbamol (ROBAXIN) 500 MG tablet Take 250-500 mg by mouth 2 (two) times daily as needed for muscle spasms.     MOUNJARO 7.5 MG/0.5ML Pen as directed: 7.5MG  Subcutaneous ONCE WEEKLY for 30 days     omeprazole  (PRILOSEC) 40 MG capsule Take 40 mg by mouth at bedtime.     Potassium 99 MG TABS Take 99 mg by mouth daily.     traMADol  (ULTRAM ) 50 MG tablet Take 50 mg by mouth 3 (three) times daily as needed for moderate pain.     traZODone  (DESYREL ) 100 MG tablet Take 100-200 mg by mouth at bedtime.     VYVANSE  20 MG capsule Take 20 mg by mouth daily with breakfast. Take with 50 mg for a total of 70 mg     VYVANSE  50 MG capsule Take 50 mg by mouth See admin instructions. Take with 20 mg for a total  of 70 mg in the morning     No current facility-administered medications for this visit.    PAST MEDICAL HISTORY: 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HCC)    Bipolar disorder (HCC)    On disability   Depression    Diabetes mellitus without complication (HCC)    per patient : under control with diet, does not monitor cbg at home    GERD (gastroesophageal reflux disease)    Headache(784.0)    daily headaches   Hyperlipidemia    Neuromuscular disorder (HCC)    Beckers muscular dystrophy   Obesity    PTSD (post-traumatic stress disorder)     PAST SURGICAL HISTORY: Past Surgical History:  Procedure Laterality Date   APPLICATION OF WOUND VAC N/A 11/23/2018   Procedure: Application Of Wound Vac;  Surgeon: Kinsinger, Alphonso Aschoff, MD;  Location: [facility] OR;  Service: General;  Laterality: N/A;   BIOPSY  12/01/2022   Procedure: BIOPSY;  Surgeon: Vinetta Greening, DO;  Location: [facility];  Service: Endoscopy;;   COLECTOMY N/A 11/23/2018   Procedure: TOTAL COLECTOMY;  Surgeon: Derral Flick, MD;  Location: [facility] OR;  Service: General;  Laterality: N/A;   COLONOSCOPY WITH PROPOFOL  N/A 12/01/2022   Procedure: COLONOSCOPY WITH PROPOFOL ;  Surgeon: Vinetta Greening, DO;  Location: [facility];  Service: Endoscopy;  Laterality: N/A;  10:45 am   ILEOSTOMY N/A 11/23/2018   Procedure: ILEOSTOMY;  Surgeon: Kinsinger, Alphonso Aschoff, MD;  Location: [facility] OR;  Service: General;  Laterality: N/A;   ILEOSTOMY CLOSURE N/A 05/19/2019   Procedure: ILEOSTOMY REVERSAL WITH ILEOCOLIC AND COLORECTAL ANASTOMOSIS;  Surgeon: Dorrie Gaudier Alphonso Aschoff, MD;  Location: [facility] ORS;  Service: General;  Laterality: N/A;   INCISIONAL HERNIA REPAIR N/A 01/04/2021   Procedure: LAPAROSCOPIC INCISIONAL HERNIA REPAIR WITH MESH; LYSIS OF ADHESIONS;  Surgeon: Dorrie Gaudier Alphonso Aschoff, MD;  Location: [facility] OR[facility];  Service: General;  Laterality: N/A;   IR GUIDED DRAIN W CATHETER PLACEMENT   02/14/2021   IR RADIOLOGIST EVAL & MGMT  02/19/2021   IR RADIOLOGIST EVAL & MGMT  03/21/2021   IR RADIOLOGIST EVAL & MGMT  04/03/2021   IR RADIOLOGIST EVAL & MGMT  04/17/2021   NM MYOCAR PERF WALL MOTION  01/22/2011   protocol: Persantine, moderate reversible inferior defect post stress EF 48%, high risk scan   POLYPECTOMY  12/01/2022   Procedure: POLYPECTOMY;  Surgeon: Mordechai April,  Rolando Cliche, DO;  Location: [facility];  Service: Endoscopy;;   TONSILLECTOMY     TRANSTHORACIC ECHOCARDIOGRAM  07/05/2004   EF=>55% normal study     FAMILY HISTORY: Family History  Problem Relation Age of Onset   COPD Mother    Depression Mother    Diabetes Mother    Hyperlipidemia Mother    Asthma Father    Arthritis Father    Diabetes Father    Heart disease Father    Hyperlipidemia Father    Hypertension Father    Hypertension Brother    Colon cancer Neg Hx     SOCIAL HISTORY: Social History   Socioeconomic History   Marital status: Married    Spouse name: Not on file   Number of children: 2   Years of education: 12   Highest education level: Not on file  Occupational History    Employer: UNEMPLOYED  Tobacco Use   Smoking status: Every Day    Current packs/day: 1.50    Average packs/day: 1.5 packs/day for 21.0 years (31.5 ttl pk-yrs)    Types: Cigarettes   Smokeless tobacco: Never  Vaping Use   Vaping status: Former   Devices: Increased nicotine cravings  Substance and Sexual Activity   Alcohol use: No    Alcohol/week: 0.0 standard drinks of alcohol    Comment: quit 2013   Drug use: Not Currently    Types: Marijuana    Comment: 2009 last use   Sexual activity: Yes    Birth control/protection: None  Other Topics Concern   Not on file  Social History Narrative   Patient lives at home with parents, brother and brothers wife.    Patient is single.    Patient has 2 children.    Patient has a high school education.    Patient is unemployed.    Patient is right handed.    Social  Drivers of Corporate investment banker Strain: Not on file  Food Insecurity: Not on file  Transportation Needs: No Transportation Needs (08/27/2022)   PRAPARE - Administrator, Civil Service (Medical): No    Lack of Transportation (Non-Medical): No  Physical Activity: Not on file  Stress: Not on file  Social Connections: Not on file  Intimate Partner Violence: Not on file      Phebe Brasil, M.D. Ph.D.  [facility] [address], [address] [place], [place] [address] Ph: [phone] Fax: [phone]  CC:  Gwenyth Leo, FNP [address] [place],  [place] [address]  Bucio, Elsa C, FNP

## 2023-12-03 ENCOUNTER — Encounter: Payer: Self-pay | Admitting: Neurology

## 2023-12-03 LAB — VITAMIN B12: Vitamin B-12: 623 pg/mL (ref 232–1245)

## 2023-12-03 LAB — MULTIPLE MYELOMA PANEL, SERUM
Albumin SerPl Elph-Mcnc: 3.7 g/dL (ref 2.9–4.4)
Albumin/Glob SerPl: 1.1 (ref 0.7–1.7)
Alpha 1: 0.3 g/dL (ref 0.0–0.4)
Alpha2 Glob SerPl Elph-Mcnc: 0.9 g/dL (ref 0.4–1.0)
B-Globulin SerPl Elph-Mcnc: 1.2 g/dL (ref 0.7–1.3)
Gamma Glob SerPl Elph-Mcnc: 1 g/dL (ref 0.4–1.8)
Globulin, Total: 3.4 g/dL (ref 2.2–3.9)
IgA/Immunoglobulin A, Serum: 344 mg/dL (ref 90–386)
IgG (Immunoglobin G), Serum: 1098 mg/dL (ref 603–1613)
IgM (Immunoglobulin M), Srm: 42 mg/dL (ref 20–172)
Total Protein: 7.1 g/dL (ref 6.0–8.5)

## 2023-12-03 LAB — HIV ANTIBODY (ROUTINE TESTING W REFLEX): HIV Screen 4th Generation wRfx: NONREACTIVE

## 2023-12-03 LAB — CK: Total CK: 271 U/L (ref 49–439)

## 2023-12-03 LAB — SEDIMENTATION RATE: Sed Rate: 28 mm/h — ABNORMAL HIGH (ref 0–15)

## 2023-12-03 LAB — C-REACTIVE PROTEIN: CRP: 6 mg/L (ref 0–10)

## 2023-12-03 LAB — ANA W/REFLEX IF POSITIVE: Anti Nuclear Antibody (ANA): NEGATIVE

## 2023-12-03 LAB — TSH: TSH: 0.559 u[IU]/mL (ref 0.450–4.500)

## 2023-12-03 LAB — VITAMIN D 25 HYDROXY (VIT D DEFICIENCY, FRACTURES): Vit D, 25-Hydroxy: 19.2 ng/mL — ABNORMAL LOW (ref 30.0–100.0)

## 2023-12-03 LAB — RPR: RPR Ser Ql: NONREACTIVE

## 2023-12-03 LAB — HGB A1C W/O EAG: Hgb A1c MFr Bld: 10.8 % — ABNORMAL HIGH (ref 4.8–5.6)

## 2024-01-08 ENCOUNTER — Other Ambulatory Visit: Payer: Self-pay | Admitting: Gastroenterology

## 2024-01-11 ENCOUNTER — Telehealth: Payer: Self-pay | Admitting: Neurology

## 2024-01-11 NOTE — Telephone Encounter (Signed)
 Call to wife, she provided new number. No answer. Called Amy back and she will call him and have him call us

## 2024-01-11 NOTE — Telephone Encounter (Signed)
 [facility] Olegario Messier) gabapentin (NEURONTIN) 300 MG capsule prescribed by two physicians with different directions. Would like get on the same page with which directions patient needs to be on.

## 2024-01-11 NOTE — Telephone Encounter (Signed)
 Call to [facility], she reports patient is also getting 100 mg capsules 1x day from another provider. Advised I was waiting to patient to call back

## 2024-01-15 ENCOUNTER — Ambulatory Visit: Payer: 59 | Admitting: Nurse Practitioner

## 2024-01-15 DIAGNOSIS — Z794 Long term (current) use of insulin: Secondary | ICD-10-CM

## 2024-01-15 DIAGNOSIS — Z7984 Long term (current) use of oral hypoglycemic drugs: Secondary | ICD-10-CM

## 2024-01-15 DIAGNOSIS — E782 Mixed hyperlipidemia: Secondary | ICD-10-CM

## 2024-01-15 DIAGNOSIS — I10 Essential (primary) hypertension: Secondary | ICD-10-CM

## 2024-01-15 DIAGNOSIS — E1165 Type 2 diabetes mellitus with hyperglycemia: Secondary | ICD-10-CM

## 2024-02-08 ENCOUNTER — Ambulatory Visit: Payer: 59 | Admitting: Neurology

## 2024-04-26 ENCOUNTER — Encounter: Payer: Self-pay | Admitting: Nurse Practitioner

## 2024-04-26 ENCOUNTER — Ambulatory Visit: Admitting: Nurse Practitioner

## 2024-04-26 ENCOUNTER — Ambulatory Visit: Attending: Cardiovascular Disease | Admitting: Cardiovascular Disease

## 2024-04-26 ENCOUNTER — Encounter: Payer: Self-pay | Admitting: Cardiovascular Disease

## 2024-04-26 VITALS — BP 120/90 | HR 104 | Ht 67.0 in | Wt 211.6 lb

## 2024-04-26 DIAGNOSIS — E7800 Pure hypercholesterolemia, unspecified: Secondary | ICD-10-CM | POA: Diagnosis not present

## 2024-04-26 DIAGNOSIS — E1165 Type 2 diabetes mellitus with hyperglycemia: Secondary | ICD-10-CM

## 2024-04-26 DIAGNOSIS — Z794 Long term (current) use of insulin: Secondary | ICD-10-CM

## 2024-04-26 DIAGNOSIS — R072 Precordial pain: Secondary | ICD-10-CM

## 2024-04-26 DIAGNOSIS — M79604 Pain in right leg: Secondary | ICD-10-CM | POA: Diagnosis not present

## 2024-04-26 DIAGNOSIS — E66812 Obesity, class 2: Secondary | ICD-10-CM

## 2024-04-26 DIAGNOSIS — R0789 Other chest pain: Secondary | ICD-10-CM

## 2024-04-26 DIAGNOSIS — M79605 Pain in left leg: Secondary | ICD-10-CM

## 2024-04-26 DIAGNOSIS — F1010 Alcohol abuse, uncomplicated: Secondary | ICD-10-CM | POA: Diagnosis not present

## 2024-04-26 DIAGNOSIS — E559 Vitamin D deficiency, unspecified: Secondary | ICD-10-CM

## 2024-04-26 DIAGNOSIS — E782 Mixed hyperlipidemia: Secondary | ICD-10-CM

## 2024-04-26 DIAGNOSIS — Z7984 Long term (current) use of oral hypoglycemic drugs: Secondary | ICD-10-CM

## 2024-04-26 DIAGNOSIS — I10 Essential (primary) hypertension: Secondary | ICD-10-CM

## 2024-04-26 DIAGNOSIS — F17200 Nicotine dependence, unspecified, uncomplicated: Secondary | ICD-10-CM

## 2024-04-26 MED ORDER — METOPROLOL TARTRATE 100 MG PO TABS: ORAL_TABLET | ORAL | 0 refills | Status: AC

## 2024-04-26 NOTE — Assessment & Plan Note (Signed)
 History of hyperlipidemia on statin therapy.  Is been several years since his last lipid profile.  Will recheck a lipid and liver profile.

## 2024-04-26 NOTE — Assessment & Plan Note (Signed)
 Patient has been complaining of some atypical chest pain rating to his left arm over the last several weeks.  Given his multiple risk factors I am going to get a coronary CTA to further evaluate.

## 2024-04-26 NOTE — Patient Instructions (Signed)
 Testing/Procedures:   Your physician has requested that you have an ankle brachial index (ABI). During this test an ultrasound and blood pressure cuff are used to evaluate the arteries that supply the arms and legs with blood. Allow thirty minutes for this exam. There are no restrictions or special instructions.  Please note: We ask at that you not bring children with you during ultrasound (echo/ vascular) testing. Due to room size and safety concerns, children are not allowed in the ultrasound rooms during exams. Our front office staff cannot provide observation of children in our lobby area while testing is being conducted. An adult accompanying a patient to their appointment will only be allowed in the ultrasound room at the discretion of the ultrasound technician under special circumstances. We apologize for any inconvenience. [address]     Your cardiac CT will be scheduled at    [facility] [address]  [place], [place] [address]    If scheduled at the Heart and Vascular Tower at [address], please enter the parking lot using the [address] entrance and use the FREE valet service at the patient drop-off area. Enter the buidling and check-in with registration on the main floor.   Please follow these instructions carefully (unless otherwise directed):  An IV will be required for this test and Nitroglycerin  will be given.  Hold all erectile dysfunction medications at least 3 days (72 hrs) prior to test. (Ie viagra , cialis, sildenafil , tadalafil, etc)   On the Night Before the Test: Be sure to Drink plenty of water . Do not consume any caffeinated/decaffeinated beverages or chocolate 12 hours prior to your test. Do not take any antihistamines 12 hours prior to your test.   On the Day of the Test: Drink plenty of water  until 1 hour prior to the test. Do not eat any food 1 hour prior to test. You may take your regular medications  prior to the test.  Take metoprolol  (Lopressor ) 100 MG two hours prior to test. Patients who wear a continuous glucose monitor MUST remove the device prior to scanning.       After the Test: Drink plenty of water . After receiving IV contrast, you may experience a mild flushed feeling. This is normal. On occasion, you may experience a mild rash up to 24 hours after the test. This is not dangerous. If this occurs, you can take Benadryl  25 mg, Zyrtec, Claritin , or Allegra and increase your fluid intake. (Patients taking Tikosyn should avoid Benadryl , and may take Zyrtec, Claritin , or Allegra) If you experience trouble breathing, this can be serious. If it is severe call 911 IMMEDIATELY. If it is mild, please call our office.  We will call to schedule your test 2-4 weeks out understanding that some insurance companies will need an authorization prior to the service being performed.   For more information and frequently asked questions, please visit our website : [email or web address]  For non-scheduling related questions, please contact the cardiac imaging nurse navigator should you have any questions/concerns: Cardiac Imaging Nurse Navigators Direct Office Dial : [phone]   For scheduling needs, including cancellations and rescheduling, please call [place], [phone].   Follow-Up: At [facility], you and your health needs are our priority.  As part of our continuing mission to provide you with exceptional heart care, our providers are all part of one team.  This team includes your primary Cardiologist (physician) and Advanced Practice Providers or APPs (Physician Assistants and Nurse Practitioners) who all  work together to provide you with the care you need, when you need it.  Your next appointment:   3 month(s)  Provider:   JONATHAN BERRY MD

## 2024-04-26 NOTE — Assessment & Plan Note (Signed)
 Symptoms sound either orthopedic or secondary to diabetic peripheral neuropathy.  He does have palpable pedal pulses on exam.  Going to get bilateral ABIs to further evaluate.

## 2024-04-26 NOTE — Progress Notes (Signed)
 04/26/2024 Gedeon Brandow [facility]   03-02-1981  [id number]  Primary Physician Bucio, Silvio BROCKS, FNP Primary Cardiologist: Dorn JINNY Lesches MD GENI CODY LYNITA ILAH  HPI:  [name] is a 43 y.o.  morbidly overweight married Caucasian male father of 2 young children who now works as a Stage manager, and now a Financial trader. He is the son of Armida Porch, one of my patients as well. He has a history of tobacco abuse currently smoking 1-1/2 packs a day.  He did drink and use illicit drugs in the past which he has discontinued.  I last saw him 12/22/2014.  He had a normal 2D echo in 2005 and a negative Myoview 01/22/11.  He returns today because of new onset chest pain rating down to his left arm as well as pain in his legs which is fairly constant.  He does have a new diagnosis of diabetes over the last 5 years and has symptoms compatible with diabetic peripheral neuropathy as well.  He has lost 75 pound since I saw him 9 years ago and apparently had colonic rupture 5 years ago.   Current Meds  Medication Sig   albuterol  (VENTOLIN  HFA) 108 (90 Base) MCG/ACT inhaler Inhale 2 puffs into the lungs every 6 (six) hours as needed for wheezing or shortness of breath.   atorvastatin  (LIPITOR) 40 MG tablet Take 1 tablet (40 mg total) by mouth daily.   Blood Glucose Monitoring Suppl (ONE TOUCH ULTRA 2) w/Device KIT Measure blood sugar daily and with meals for insulin    escitalopram (LEXAPRO) 10 MG tablet Take 10 mg by mouth daily as needed (anxiety).   famotidine  (PEPCID ) 20 MG tablet TAKE 1 TABLET BY MOUTH DAILY AS NEEDED FOR heartburn OR INDIGESTION   fluticasone  (FLONASE ) 50 MCG/ACT nasal spray Place 2 sprays into both nostrils daily.   gabapentin  (NEURONTIN ) 300 MG capsule Take 1 capsule (300 mg total) by mouth 3 (three) times daily.   glipiZIDE  (GLUCOTROL ) 5 MG tablet Take 1 tablet (5 mg total) by mouth 2 (two) times daily.   glucose blood (ONETOUCH ULTRA) test strip Use as instructed to monitor glucose 4 times  daily   HUMALOG  100 UNIT/ML injection inject 5-11 units into THE SKIN THREE TIMES DAILY with meals   Lancets (ONETOUCH ULTRASOFT) lancets Use as instructed to monitor glucose 4 times daily   LANTUS  100 UNIT/ML injection INJECT 40 units into THE SKIN AT BEDTIME   LANTUS  SOLOSTAR 100 UNIT/ML Solostar Pen Inject 40 Units into the skin at bedtime.   linaclotide (LINZESS) 72 MCG capsule Take by mouth.   lurasidone  (LATUDA ) 80 MG TABS tablet Take 80 mg by mouth daily with breakfast.   methocarbamol (ROBAXIN) 500 MG tablet Take 250-500 mg by mouth 2 (two) times daily as needed for muscle spasms.   metoprolol  tartrate (LOPRESSOR ) 100 MG tablet TAKE 2 HOURS PRIOR TO CT SCAN   NURTEC 75 MG TBDP Take 75 mg by mouth as needed.   omeprazole  (PRILOSEC) 40 MG capsule Take 40 mg by mouth at bedtime.   timolol (TIMOPTIC) 0.5 % ophthalmic solution Place 1 drop into both eyes daily.   traMADol  (ULTRAM ) 50 MG tablet Take 50 mg by mouth 3 (three) times daily as needed for moderate pain.   traZODone  (DESYREL ) 100 MG tablet Take 100-200 mg by mouth at bedtime.   VYVANSE  20 MG capsule Take 20 mg by mouth daily with breakfast. Take with 50 mg for a total of 70 mg   VYVANSE   50 MG capsule Take 50 mg by mouth See admin instructions. Take with 20 mg for a total of 70 mg in the morning   [DISCONTINUED] MOUNJARO 7.5 MG/0.5ML Pen as directed: 7.5MG  Subcutaneous ONCE WEEKLY for 30 days     Allergies  Allergen Reactions   Metformin  And Related Other (See Comments)    Stomach cramps,  Throat closing sensation, could not swallow   Penicillins Anaphylaxis, Hives, Shortness Of Breath and Swelling    Did it involve swelling of the face/tongue/throat, SOB, or low BP? Yes Did it involve sudden or severe rash/hives, skin peeling, or any reaction on the inside of your mouth or nose? Unk Did you need to seek medical attention at a hospital or doctor's office? No When did it last happen? Unk  If all above answers are NO, may  proceed with cephalosporin use. Got ceftriaxone  before    Nsaids Other (See Comments)    States he is not supposed to take nsaids because of his colon      Social History   Socioeconomic History   Marital status: Married    Spouse name: Not on file   Number of children: 2   Years of education: 12   Highest education level: Not on file  Occupational History    Employer: UNEMPLOYED  Tobacco Use   Smoking status: Every Day    Current packs/day: 1.50    Average packs/day: 1.5 packs/day for 21.0 years (31.5 ttl pk-yrs)    Types: Cigarettes   Smokeless tobacco: Never  Vaping Use   Vaping status: Former   Devices: Increased nicotine cravings  Substance and Sexual Activity   Alcohol use: No    Alcohol/week: 0.0 standard drinks of alcohol    Comment: quit 2013   Drug use: Not Currently    Types: Marijuana    Comment: 2009 last use   Sexual activity: Yes    Birth control/protection: None  Other Topics Concern   Not on file  Social History Narrative   Patient lives at home with parents, brother and brothers wife.    Patient is single.    Patient has 2 children.    Patient has a high school education.    Patient is unemployed.    Patient is right handed.    Social Drivers of Corporate investment banker Strain: Low Risk  (01/14/2024)   Received [facility]   Overall Financial Resource Strain (CARDIA)    Difficulty of Paying Living Expenses: Not hard at all  Food Insecurity: No Food Insecurity (01/14/2024)   Received from [facility]   Hunger Vital Sign    Within the past 12 months, you worried that your food would run out before you got the money to buy more.: Never true    Within the past 12 months, the food you bought just didn't last and you didn't have money to get more.: Never true  Transportation Needs: No Transportation Needs (01/14/2024)   Received from [facility] - Transportation    Lack of Transportation (Medical): No    Lack of  Transportation (Non-Medical): No  Physical Activity: Insufficiently Active (01/14/2024)   Received from [facility]   Exercise Vital Sign    On average, how many days per week do you engage in moderate to strenuous exercise (like a brisk walk)?: 2 days    On average, how many minutes do you engage in exercise at this level?: 60 min  Stress:  Stress Concern Present (01/14/2024)   Received from [facility] - Occupational Stress Questionnaire    Feeling of Stress : Rather much  Social Connections: Socially Isolated (01/14/2024)   Received from [facility]   Social Connection and Isolation Panel    In a typical week, how many times do you talk on the phone with family, friends, or neighbors?: Never    How often do you get together with friends or relatives?: Never    How often do you attend church or religious services?: Never    Do you belong to any clubs or organizations such as church groups, unions, fraternal or athletic groups, or school groups?: No    How often do you attend meetings of the clubs or organizations you belong to?: Never    Are you married, widowed, divorced, separated, never married, or living with a partner?: Married  Intimate Partner Violence: Not At Risk (01/14/2024)   Received from [facility]   Humiliation, Afraid, Rape, and Kick questionnaire    Within the last year, have you been afraid of your partner or ex-partner?: No    Within the last year, have you been humiliated or emotionally abused in other ways by your partner or ex-partner?: No    Within the last year, have you been kicked, hit, slapped, or otherwise physically hurt by your partner or ex-partner?: No    Within the last year, have you been raped or forced to have any kind of sexual activity by your partner or ex-partner?: No     Review of Systems: General: negative for chills, fever, night sweats or weight changes.  Cardiovascular: negative for  chest pain, dyspnea on exertion, edema, orthopnea, palpitations, paroxysmal nocturnal dyspnea or shortness of breath Dermatological: negative for rash Respiratory: negative for cough or wheezing Urologic: negative for hematuria Abdominal: negative for nausea, vomiting, diarrhea, bright red blood per rectum, melena, or hematemesis Neurologic: negative for visual changes, syncope, or dizziness All other systems reviewed and are otherwise negative except as noted above.    Blood pressure (!) 120/90, pulse (!) 104, height 5' 7 (1.702 m), weight 211 lb 9.6 oz (96 kg), SpO2 95%.  General appearance: alert and no distress Neck: no adenopathy, no carotid bruit, no JVD, supple, symmetrical, trachea midline, and thyroid  not enlarged, symmetric, no tenderness/mass/nodules Lungs: clear to auscultation bilaterally Heart: regular rate and rhythm, S1, S2 normal, no murmur, click, rub or gallop Extremities: extremities normal, atraumatic, no cyanosis or edema Pulses: 2+ and symmetric Skin: Skin color, texture, turgor normal. No rashes or lesions Neurologic: Grossly normal  EKG EKG Interpretation Date/Time:  Tuesday April 26 2024 14:20:56 EDT Ventricular Rate:  104 PR Interval:  140 QRS Duration:  88 QT Interval:  354 QTC Calculation: 465 R Axis:   79  Text Interpretation: Sinus tachycardia When compared with ECG of 06-Feb-2022 11:36, PREVIOUS ECG IS PRESENT Confirmed by Court Carrier [phone]) on 04/26/2024 2:32:50 PM    ASSESSMENT AND PLAN:   Pure hypercholesterolemia History of hyperlipidemia on statin therapy.  Is been several years since his last lipid profile.  Will recheck a lipid and liver profile.  ABUSE, ALCOHOL, UNSPECIFIED Stopped 12 years ago  TOBACCO ABUSE Continues to smoke 1-1/2 packs a day  OBESITY BMI 33.  He is lost 75 pounds since I saw him 9 years ago.  Atypical chest pain Patient has been complaining of some atypical chest pain rating to his left arm over the  last  several weeks.  Given his multiple risk factors I am going to get a coronary CTA to further evaluate.  Bilateral lower extremity pain Symptoms sound either orthopedic or secondary to diabetic peripheral neuropathy.  He does have palpable pedal pulses on exam.  Going to get bilateral ABIs to further evaluate.     Dorn DOROTHA Lesches MD FACP,FACC,FAHA, [facility] 04/26/2024 2:49 PM

## 2024-04-26 NOTE — Assessment & Plan Note (Signed)
 Stopped 12 years ago

## 2024-04-26 NOTE — Assessment & Plan Note (Signed)
 BMI 33.  He is lost 75 pounds since I saw him 9 years ago.

## 2024-04-26 NOTE — Assessment & Plan Note (Signed)
 Continues to smoke 1-1/2 packs a day

## 2024-05-04 ENCOUNTER — Ambulatory Visit: Payer: Self-pay | Admitting: Cardiovascular Disease

## 2024-05-04 ENCOUNTER — Encounter (HOSPITAL_COMMUNITY): Payer: Self-pay

## 2024-05-04 DIAGNOSIS — E7800 Pure hypercholesterolemia, unspecified: Secondary | ICD-10-CM

## 2024-05-04 LAB — COMPREHENSIVE METABOLIC PANEL WITH GFR
ALT: 51 IU/L — ABNORMAL HIGH (ref 0–44)
AST: 32 IU/L (ref 0–40)
Albumin: 4.4 g/dL (ref 4.1–5.1)
Alkaline Phosphatase: 126 IU/L — ABNORMAL HIGH (ref 44–121)
BUN/Creatinine Ratio: 5 — ABNORMAL LOW (ref 9–20)
BUN: 5 mg/dL — ABNORMAL LOW (ref 6–24)
Bilirubin Total: 0.3 mg/dL (ref 0.0–1.2)
CO2: 24 mmol/L (ref 20–29)
Calcium: 9.3 mg/dL (ref 8.7–10.2)
Chloride: 92 mmol/L — ABNORMAL LOW (ref 96–106)
Creatinine, Ser: 0.92 mg/dL (ref 0.76–1.27)
Globulin, Total: 3.1 g/dL (ref 1.5–4.5)
Glucose: 378 mg/dL — ABNORMAL HIGH (ref 70–99)
Potassium: 3.9 mmol/L (ref 3.5–5.2)
Sodium: 131 mmol/L — ABNORMAL LOW (ref 134–144)
Total Protein: 7.5 g/dL (ref 6.0–8.5)
eGFR: 107 mL/min/1.73 (ref >59)

## 2024-05-04 LAB — LIPID PANEL
Chol/HDL Ratio: 5.1 ratio — ABNORMAL HIGH (ref 0.0–5.0)
Cholesterol, Total: 153 mg/dL (ref 100–199)
HDL: 30 mg/dL — ABNORMAL LOW (ref >39)
LDL Chol Calc (NIH): 85 mg/dL (ref 0–99)
Triglycerides: 226 mg/dL — ABNORMAL HIGH (ref 0–149)
VLDL Cholesterol Cal: 38 mg/dL (ref 5–40)

## 2024-05-05 MED ORDER — ATORVASTATIN CALCIUM 80 MG PO TABS: 80.0000 mg | ORAL_TABLET | Freq: Every day | ORAL | 3 refills | Status: AC

## 2024-05-06 ENCOUNTER — Ambulatory Visit (HOSPITAL_COMMUNITY)
Admission: RE | Admit: 2024-05-06 | Discharge: 2024-05-06 | Disposition: A | Discharge status: Home / Self Care | Source: Ambulatory Visit | Attending: Cardiovascular Disease | Admitting: Cardiovascular Disease

## 2024-05-06 DIAGNOSIS — I2510 Atherosclerotic heart disease of native coronary artery without angina pectoris: Secondary | ICD-10-CM | POA: Diagnosis not present

## 2024-05-06 DIAGNOSIS — R072 Precordial pain: Secondary | ICD-10-CM | POA: Diagnosis present

## 2024-05-06 MED ORDER — IOHEXOL 350 MG/ML SOLN
100.0000 mL | Freq: Once | INTRAVENOUS | Status: AC | PRN
  Administered 2024-05-06: 100 mL via INTRAVENOUS

## 2024-05-06 MED ORDER — NITROGLYCERIN 0.4 MG SL SUBL
0.8000 mg | SUBLINGUAL_TABLET | Freq: Once | SUBLINGUAL | Status: AC
  Administered 2024-05-06: 0.8 mg via SUBLINGUAL

## 2024-05-09 ENCOUNTER — Encounter: Payer: Self-pay | Admitting: Cardiovascular Disease

## 2024-05-10 ENCOUNTER — Ambulatory Visit (HOSPITAL_COMMUNITY)
Admission: RE | Admit: 2024-05-10 | Discharge: 2024-05-10 | Disposition: A | Discharge status: Home / Self Care | Source: Ambulatory Visit | Attending: Cardiovascular Disease | Admitting: Cardiovascular Disease

## 2024-05-10 DIAGNOSIS — M79604 Pain in right leg: Secondary | ICD-10-CM | POA: Diagnosis not present

## 2024-05-10 DIAGNOSIS — M79605 Pain in left leg: Secondary | ICD-10-CM | POA: Insufficient documentation

## 2024-05-10 LAB — VAS US ABI WITH/WO TBI
Left ABI: 1.11
Right ABI: 1.26

## 2024-05-23 ENCOUNTER — Other Ambulatory Visit: Payer: Self-pay | Admitting: Cardiovascular Disease

## 2024-05-23 NOTE — Progress Notes (Signed)
 Chief concern/reason for visit:  Diabetes    Assessment & Plan Type 2 diabetes mellitus with hyperglycemia, with long-term current use of insulin    Patient's glucose in the office was 410.  Patient reports that whenever he does Lantus  his sugars always seem to greatly increase.  The patient would like to stop the Lantus . Plan is to increase the Mounjaro to 15 mg.  The week after insulin  is going to go from 10 units to 15 units per meal.  The week after the plan is to increase the glipizide  from 5 mg to 10 mg twice a day.  Discussed the plan of making 1 change per week so that there will be less symptoms of blood sugar lowering. Discussed possibly adding Actos if blood sugars are still high after the glipizide  increase.  Orders: .  HUMALOG  U-100 INSULIN  100 unit/mL injection; Inject 0.15 mL (15 Units total) under the skin Three (3) times a day before meals. .  tirzepatide (MOUNJARO) 2.5 mg/0.5 mL PnIj; Inject 3 mL (15 mg total) under the skin once a week. SABRA  POCT Glucose .  glipiZIDE  (GLUCOTROL ) 10 MG tablet; Take 1 tablet (10 mg total) by mouth Two (2) times a day (30 minutes before a meal).  Neuropathy due to type 2 diabetes mellitus    Patient reports neuropathy is doing better since starting Lyrica.     Hyperlipidemia, unspecified hyperlipidemia type Plan is to continue the atorvastatin  80 mg as directed by cardiology.    Chronic obstructive pulmonary disease, unspecified COPD type   Plan is to use the inhaler as previously prescribed as well as use the nebulizer as needed.  Patient also has a sleep study that is scheduled for next month.   Orders: .  Nebulizers and Supplies; Future .  ipratropium-albuterol  (DUO-NEB) 0.5-2.5 mg/3 mL nebulizer; Inhale 3 mL by nebulization every six (6) hours as needed.     Return in about 2 weeks (around 06/06/2024) for Recheck.  Subjective Diabetes   Patient reports that he notices sugars are high and he has a lot of fatigue and  sleeping during the day and has had quite a bit of blurry vision.  Patient reports that he has cut out a lot of sugar in his diet and was over 300 pounds and has lost a lot of weight.  Patient reports that the Lantus  always increases his sugars greatly.  Your CBC shows the beginning of possible low iron. I recommend checking the iron and ferritin level at the next visit.  Your CMP shows salt imbalance, your kidney function is good, your glucose is extremely high which I noted previously talked about. Your hemoglobin A1c is 11 which is extremely high. Please bring all of your diabetic medications into the office at your visit so that we can alter your medications.  Your thyroid  level was within normal limits. Your vitamin B12 and vitamin D  were also within normal limits.  Your cholesterol panel overall is within normal limits, your HDL which is the healthy cholesterol that helps to prevent heart disease is extremely low as well as your triglycerides are high which can increase the risk of heart disease. I recommend decreasing fried greasy foods, fatty red meats and increasing fruits and vegetables as well as starting an over-the-counter fish oil supplement and rechecking the cholesterol level in 6 months.    Objective The following portions of the patient's chart were reviewed in this encounter and updated as appropriate: allergies, medications, problems, medical history, surgical history, and  family history.  BP 102/70   Pulse 105   Temp 36.6 C (97.9 F) (Temporal)   Resp 18   Ht 170.2 cm (5' 7)   Wt 96.8 kg (213 lb 6.4 oz)   SpO2 96%   BMI 33.42 kg/m   Physical Exam: Physical Exam Vitals and nursing note reviewed. Exam conducted with a chaperone present (wife).  Constitutional:      General: He is not in acute distress.    Appearance: Normal appearance. He is obese. He is not ill-appearing, toxic-appearing or diaphoretic.  HENT:     Head: Normocephalic.     Right Ear: External ear  normal.     Left Ear: External ear normal.     Nose: Nose normal.  Eyes:     Extraocular Movements: Extraocular movements intact.  Cardiovascular:     Rate and Rhythm: Normal rate and regular rhythm.  Pulmonary:     Effort: Pulmonary effort is normal. No respiratory distress.     Breath sounds: Wheezing present. No rhonchi or rales.  Musculoskeletal:        General: Normal range of motion.     Cervical back: Normal range of motion and neck supple.  Skin:    General: Skin is warm.     Findings: No rash.  Neurological:     General: No focal deficit present.     Mental Status: He is alert and oriented to person, place, and time. Mental status is at baseline.  Psychiatric:        Mood and Affect: Mood normal.        Thought Content: Thought content normal.        Judgment: Judgment normal.          I personally spent 25 minutes face-to-face and non-face-to-face in the care of this patient, which includes all pre, intra, and post visit time on the date of service.      Lauraine Fus, NP

## 2024-05-24 NOTE — Telephone Encounter (Signed)
 Patient called the office today requesting for insulin  syringes to be sent to his pharmacy - [facility]   Also requesting status update for -nebulizer machine and supplies- will send fax order to [facility] pharmacy for review

## 2024-06-12 ENCOUNTER — Other Ambulatory Visit: Payer: Self-pay | Admitting: Gastroenterology

## 2024-06-18 ENCOUNTER — Other Ambulatory Visit (HOSPITAL_COMMUNITY): Payer: Self-pay

## 2024-07-27 ENCOUNTER — Ambulatory Visit: Attending: Cardiology | Admitting: Cardiovascular Disease

## 2024-07-27 ENCOUNTER — Encounter: Payer: Self-pay | Admitting: Cardiovascular Disease

## 2024-07-27 VITALS — BP 102/66 | HR 107 | Resp 16 | Ht 67.0 in | Wt 219.4 lb

## 2024-07-27 DIAGNOSIS — F17200 Nicotine dependence, unspecified, uncomplicated: Secondary | ICD-10-CM

## 2024-07-27 DIAGNOSIS — E7800 Pure hypercholesterolemia, unspecified: Secondary | ICD-10-CM

## 2024-07-27 DIAGNOSIS — M79604 Pain in right leg: Secondary | ICD-10-CM | POA: Diagnosis not present

## 2024-07-27 DIAGNOSIS — M79605 Pain in left leg: Secondary | ICD-10-CM

## 2024-07-27 DIAGNOSIS — E66812 Obesity, class 2: Secondary | ICD-10-CM | POA: Diagnosis not present

## 2024-07-27 DIAGNOSIS — R0789 Other chest pain: Secondary | ICD-10-CM

## 2024-07-27 NOTE — Assessment & Plan Note (Signed)
 Ongoing tobacco abuse of 1-1/2 packs/day recalcitrant to risk factor modification.  He does complain of shortness of breath and has COPD.

## 2024-07-27 NOTE — Assessment & Plan Note (Signed)
 Weight has significantly come down.

## 2024-07-27 NOTE — Patient Instructions (Signed)

## 2024-07-27 NOTE — Progress Notes (Signed)
 07/27/2024 [name] [facility]   May 23, 1981  [id number]  Primary Physician [name] BROCKS, FNP Primary Cardiologist: Victor JINNY Lesches MD GENI [name]  HPI:  [name] is a 43 y.o.    morbidly overweight married Caucasian male father of 2 young children who now works as a Stage manager, and now a Financial trader. He is the son of [name], one of my patients as well.  He is accompanied by his wife [name] today.  He has a history of tobacco abuse currently smoking 1-1/2 packs a day.  He did drink and use illicit drugs in the past which he has discontinued.  I last saw him 12/22/2014.  He had a normal 2D echo in 2005 and a negative Myoview 01/22/11.  He returns today because of new onset chest pain rating down to his left arm as well as pain in his legs which is fairly constant.  He does have a new diagnosis of diabetes over the last 5 years and has symptoms compatible with diabetic peripheral neuropathy as well.  He has lost 75 pound since I saw him 9 years ago and apparently had colonic rupture 5 years ago.   Since I saw him 3 months ago he no longer has chest pain.  He did have a coronary CTA performed 05/06/2024 that showed a coronary calcium  score of 0 with mild 9 to calcified atherosclerotic plaque.  He was complaining of foot pain probably now related to peripheral neuropathy from diabetes with ABIs that were essentially normal.  We talked about the importance of smoking cessation.  He apparently needs carpal tunnel release but they will not operate on him until his hemoglobin A1c is less than 9 and now its in the 12 range.  Current Meds  Medication Sig   albuterol  (VENTOLIN  HFA) 108 (90 Base) MCG/ACT inhaler Inhale 2 puffs into the lungs every 6 (six) hours as needed for wheezing or shortness of breath.   atorvastatin  (LIPITOR) 80 MG tablet Take 1 tablet (80 mg total) by mouth daily.   Blood Glucose Monitoring Suppl (ONE TOUCH ULTRA 2) w/Device KIT Measure blood sugar daily and with meals for  insulin    escitalopram (LEXAPRO) 10 MG tablet Take 10 mg by mouth daily as needed (anxiety).   famotidine  (PEPCID ) 20 MG tablet TAKE 1 TABLET BY MOUTH DAILY AS NEEDED FOR heartburn OR INDIGESTION   fluticasone  (FLONASE ) 50 MCG/ACT nasal spray Place 2 sprays into both nostrils daily.   glucose blood (ONETOUCH ULTRA) test strip Use as instructed to monitor glucose 4 times daily   HUMALOG  100 UNIT/ML injection inject 5-11 units into THE SKIN THREE TIMES DAILY with meals   Lancets (ONETOUCH ULTRASOFT) lancets Use as instructed to monitor glucose 4 times daily   LANTUS  100 UNIT/ML injection INJECT 40 units into THE SKIN AT BEDTIME   LANTUS  SOLOSTAR 100 UNIT/ML Solostar Pen Inject 40 Units into the skin at bedtime.   linaclotide (LINZESS) 72 MCG capsule Take by mouth.   lurasidone  (LATUDA ) 80 MG TABS tablet Take 80 mg by mouth daily with breakfast.   methocarbamol (ROBAXIN) 500 MG tablet Take 250-500 mg by mouth 2 (two) times daily as needed for muscle spasms.   metoprolol  tartrate (LOPRESSOR ) 100 MG tablet TAKE 2 HOURS PRIOR TO CT SCAN   NURTEC 75 MG TBDP Take 75 mg by mouth as needed.   omeprazole  (PRILOSEC) 40 MG capsule Take 40 mg by mouth at bedtime.   timolol (TIMOPTIC) 0.5 %  ophthalmic solution Place 1 drop into both eyes daily.   traMADol  (ULTRAM ) 50 MG tablet Take 50 mg by mouth 3 (three) times daily as needed for moderate pain.   traZODone  (DESYREL ) 100 MG tablet Take 100-200 mg by mouth at bedtime.   VYVANSE  20 MG capsule Take 20 mg by mouth daily with breakfast. Take with 50 mg for a total of 70 mg   VYVANSE  50 MG capsule Take 50 mg by mouth See admin instructions. Take with 20 mg for a total of 70 mg in the morning     Allergies  Allergen Reactions   Metformin  And Related Other (See Comments)    Stomach cramps,  Throat closing sensation, could not swallow   Penicillins Anaphylaxis, Hives, Shortness Of Breath and Swelling    Did it involve swelling of the face/tongue/throat, SOB, or  low BP? Yes Did it involve sudden or severe rash/hives, skin peeling, or any reaction on the inside of your mouth or nose? Unk Did you need to seek medical attention at a hospital or doctor's office? No When did it last happen? Unk  If all above answers are NO, may proceed with cephalosporin use. Got ceftriaxone  before    Nsaids Other (See Comments)    States he is not supposed to take nsaids because of his colon      Social History   Socioeconomic History   Marital status: Married    Spouse name: Not on file   Number of children: 2   Years of education: 12   Highest education level: Not on file  Occupational History    Employer: UNEMPLOYED  Tobacco Use   Smoking status: Every Day    Current packs/day: 1.50    Average packs/day: 1.5 packs/day for 21.0 years (31.5 ttl pk-yrs)    Types: Cigarettes   Smokeless tobacco: Never  Vaping Use   Vaping status: Former   Devices: Increased nicotine cravings  Substance and Sexual Activity   Alcohol use: No    Alcohol/week: 0.0 standard drinks of alcohol    Comment: quit 2013   Drug use: Not Currently    Types: Marijuana    Comment: 2009 last use   Sexual activity: Yes    Birth control/protection: None  Other Topics Concern   Not on file  Social History Narrative   Patient lives at home with parents, brother and brothers wife.    Patient is single.    Patient has 2 children.    Patient has a high school education.    Patient is unemployed.    Patient is right handed.    Social Drivers of Corporate investment banker Strain: Low Risk  (05/23/2024)   Received from [facility]   Overall Financial Resource Strain (CARDIA)    How hard is it for you to pay for the very basics like food, housing, medical care, and heating?: Not hard at all  Food Insecurity: No Food Insecurity (06/06/2024)   Received from [facility]   Hunger Vital Sign    Within the past 12 months, you worried that your food would run out before you got  the money to buy more.: Never true    Within the past 12 months, the food you bought just didn't last and you didn't have money to get more.: Never true  Transportation Needs: No Transportation Needs (06/06/2024)   Received from [facility] - [facility]    Lack of Transportation (Medical): No  Lack of Transportation (Non-Medical): No  Physical Activity: Insufficiently Active (01/14/2024)   Received from [facility]   Exercise Vital Sign    On average, how many days per week do you engage in moderate to strenuous exercise (like a brisk walk)?: 2 days    On average, how many minutes do you engage in exercise at this level?: 60 min  Stress: Stress Concern Present (05/17/2024)   Received from [facility] of Occupational Health - Occupational Stress Questionnaire    Do you feel stress - tense, restless, nervous, or anxious, or unable to sleep at night because your mind is troubled all the time - these days?: To some extent  Social Connections: Socially Isolated (01/14/2024)   Received from [facility]   Social Connection and Isolation Panel    In a typical week, how many times do you talk on the phone with family, friends, or neighbors?: Never    How often do you get together with friends or relatives?: Never    How often do you attend church or religious services?: Never    Do you belong to any clubs or organizations such as church groups, unions, fraternal or athletic groups, or school groups?: No    How often do you attend meetings of the clubs or organizations you belong to?: Never    Are you married, widowed, divorced, separated, never married, or living with a partner?: Married  Intimate Partner Violence: Not At Risk (05/17/2024)   Received from [facility]   Humiliation, Afraid, Rape, and Kick questionnaire    Within the last year, have you been afraid of your partner or ex-partner?: No    Within the last year, have you been humiliated  or emotionally abused in other ways by your partner or ex-partner?: No    Within the last year, have you been kicked, hit, slapped, or otherwise physically hurt by your partner or ex-partner?: No    Within the last year, have you been raped or forced to have any kind of sexual activity by your partner or ex-partner?: No     Review of Systems: General: negative for chills, fever, night sweats or weight changes.  Cardiovascular: negative for chest pain, dyspnea on exertion, edema, orthopnea, palpitations, paroxysmal nocturnal dyspnea or shortness of breath Dermatological: negative for rash Respiratory: negative for cough or wheezing Urologic: negative for hematuria Abdominal: negative for nausea, vomiting, diarrhea, bright red blood per rectum, melena, or hematemesis Neurologic: negative for visual changes, syncope, or dizziness All other systems reviewed and are otherwise negative except as noted above.    Blood pressure 102/66, pulse (!) 107, resp. rate 16, height 5' 7 (1.702 m), weight 219 lb 6.4 oz (99.5 kg), SpO2 97%.  General appearance: alert and no distress Neck: no adenopathy, no carotid bruit, no JVD, supple, symmetrical, trachea midline, and thyroid  not enlarged, symmetric, no tenderness/mass/nodules Lungs: clear to auscultation bilaterally Heart: regular rate and rhythm, S1, S2 normal, no murmur, click, rub or gallop Extremities: extremities normal, atraumatic, no cyanosis or edema Pulses: 2+ and symmetric Skin: Skin color, texture, turgor normal. No rashes or lesions Neurologic: Grossly normal  EKG not performed today      ASSESSMENT AND PLAN:   Pure hypercholesterolemia History of hyperlipidemia on high-dose statin therapy with lipid profile performed 05/03/2024 revealing total cholesterol 153, LDL of 85 and HDL of 30.  Given his coronary calcium  score 0 with minimal plaque this is an acceptable value for primary  prevention.  TOBACCO ABUSE Ongoing tobacco abuse of  1-1/2 packs/day recalcitrant to risk factor modification.  He does complain of shortness of breath and has COPD.  OBESITY Weight has significantly come down.  Bilateral lower extremity pain Most likely related to diabetic peripheral neuropathy with normal ABIs.  Atypical chest pain History of atypical chest pain when I saw him 3 months ago with a coronary CTA that showed a coronary calcium  score of 0 with minimal nonobstructive CAD and noncalcified plaque.  He no longer has chest pain.     Victor DOROTHA Lesches MD FACP,FACC,FAHA, [facility] 07/27/2024 9:13 AM

## 2024-07-27 NOTE — Assessment & Plan Note (Signed)
 Most likely related to diabetic peripheral neuropathy with normal ABIs.

## 2024-07-27 NOTE — Assessment & Plan Note (Signed)
 History of atypical chest pain when I saw him 3 months ago with a coronary CTA that showed a coronary calcium  score of 0 with minimal nonobstructive CAD and noncalcified plaque.  He no longer has chest pain.

## 2024-07-27 NOTE — Assessment & Plan Note (Signed)
 History of hyperlipidemia on high-dose statin therapy with lipid profile performed 05/03/2024 revealing total cholesterol 153, LDL of 85 and HDL of 30.  Given his coronary calcium  score 0 with minimal plaque this is an acceptable value for primary prevention.

## 2024-08-07 NOTE — ED Provider Notes (Addendum)
 Emergency Department Provider Note    ED Clinical Impression   Final diagnoses:  Tachycardia (Primary)  Weakness  History of substance abuse (CMS-HCC)    ED Assessment/Plan    History   Chief Complaint  Patient presents with  . Decreased Blood Glucose Symptomatic   HPI  1845 hrs. Patient 43 year old gentleman history of type 1 diabetes for about 4 or 5 years does not have an insulin  pump uses a sensor he stated that his sugar was high and then low today after increasing his insulin  history of alcohol use by exam awake alert some in triage cardiopulmonary general neuroexam negative abdomen benign will initiate basic protocol will consider for sepsis evaluation will hold off on full sepsis bolus until cardiac status can be evaluated otherwise broad-spectrum antibiotics IV fluids ABG  Past Medical History[1]  Past Surgical History[2]  Family History[3]  Social History[4]  Review of Systems  Constitutional:  Negative for chills, fatigue and fever.  Respiratory:  Negative for chest tightness and shortness of breath.   Gastrointestinal:  Negative for abdominal pain.  All other systems reviewed and are negative.   Physical Exam   BP 136/83   Pulse 111   Temp 36.7 C (98 F) (Oral)   Resp 24   Ht 170.2 cm (5' 7)   Wt 99.8 kg (220 lb)   SpO2 95%   BMI 34.46 kg/m   Physical Exam  Vital signs have been reviewed. Patient is well-appearing w/o respiratory distress shock or major trauma. HEENT is atraumatic.  Neck shows unimpaired range of motion. Chest No increased work of breathing or audible wheezing. Cardiac Good perfusion throughout. Abdomen is not distended. Extremities are without significant trauma. Skin no visualized rash. Neuro exam is grossly non-focal. Psych exam shows normal mood and behavior.  ED Course    Triage old records reviewed meds include those for diabetes health illness he has history of substance abuse PTSD carpal tunnel chronic pain  bipolar disorder as well as diabetes   Medical Decision Making Amount and/or Complexity of Data Reviewed Labs: ordered. Radiology: ordered. ECG/medicine tests: ordered.  Risk OTC drugs. Prescription drug management.    Extensive medical history..  No recent echocardiogram.  Multiple ED evaluations no recent hospitalizations today looks sick enough to be hospitalized we will go ahead and start basic sepsis protocol without large fluid bolus in the absence of a recent echo  Patient has received fluid bolus appropriate to cardiac status started on IV antibiotics.  On re-exam at this time, it is noted that patient's color is improved and heart rate down from arrival. Mental status is stable, and patient has improved perfusion.   2015 hrs. PMP score reviewed gentleman has history of substantial amount of controlled substance use with the PMP score of 500 or so will recommend nonnarcotic pain meds  ////////////////                Filled  Written  Sold  ID  Drug  QTY  Days  Prescriber  RX #  Dispenser  Refill  Daily Dose*  Pymt Type  PMP   07/27/2024 06/30/2024  3 Tramadol  Hcl 50 Mg Tablet 120.00 30 [place] [id number] Ede (0252) 1/2 40.00 MME Medicare [place]  07/23/2024 07/11/2024  3 Dextroamp-Amphetamin 10 Mg Tab 30.00 30 Er T [id number] Ede (0252) 0/0  Medicare [place]  07/19/2024 07/11/2024  3 Lisdexamfetamine 70 Mg Capsule 25.00 25 Er T [id number] Ede (0252) 0/0  Medicare   07/04/2024 07/04/2024  3 Pregabalin 150  Mg Capsule 90.00 30 [facility] [id number] Ede [phone]) 0/3 3.01 LME Medicare [place]  06/30/2024 06/30/2024  3 Tramadol  Hcl 50 Mg Tablet 120.00 30 [place] [id number] Ede [phone]) 0/2 40.00 MME Medicare [place]  06/26/2024 06/26/2024  3 Dextroamp-Amphetamin 10 Mg Tab 30.00 30 Er T [id number] Ede (0252) 0/0  Medicare [place]  06/22/2024 06/09/2024  3 Lisdexamfetamine 70 Mg Capsule 30.00 30 Er T [id number] Ede (0252) 0/0  Medicare [place]  06/06/2024 06/06/2024  3 Pregabalin 150 Mg Capsule 90.00 30 Sa Gri [id number]         ////////////////////  Awaiting results of drug screen.  Patient looks much better heart rate is down he is not hypoglycemia or significantly hyperglycemic will consider him for outpatient treatment and referral  2110 hrs. Laboratory diagnostics are essentially unremarkable heart rates down to 110 patient looking better will consider for discharge home with close follow-up.  Continue current diabetes management Low suspicion for sepsis or major medical process heart rate now down to 102     Dallara, Norleen Agent, MD 08/07/24 1849    Dallara, John James, MD 08/07/24 2020    Dallara, John James, MD 08/07/24 2021    Dallara, John James, MD 08/07/24 2110       [1] Past Medical History: Diagnosis Date  . ADHD   . Bipolar disorder (CMS-HCC)   . Carpal tunnel syndrome on both sides   . Colonic disease   . Diabetes mellitus (CMS-HCC)   . Diverticulitis   . GERD (gastroesophageal reflux disease)   . GI (gastrointestinal bleed)   . HLD (hyperlipidemia)   . Obesity   . PTSD (post-traumatic stress disorder)   . Substance abuse (CMS-HCC)   [2] Past Surgical History: Procedure Laterality Date  . COLON SURGERY    . Colostomy takedown    . HERNIA REPAIR    [3] Family History Problem Relation Age of Onset  . COPD Mother   . Heart disease Father   . Stroke Brother   [4] Social History Socioeconomic History  . Marital status: Married  Tobacco Use  . Smoking status: Every Day    Current packs/day: 1.50    Types: Cigarettes    Passive exposure: Never  . Smokeless tobacco: Never  Vaping Use  . Vaping status: Every Day  Substance and Sexual Activity  . Alcohol use: Not Currently  . Drug use: Not Currently   Social Drivers of Health   Financial Resource Strain: Low Risk  (05/23/2024)   Overall Financial Resource Strain (CARDIA)   . Difficulty of Paying Living Expenses: Not hard at all  Food Insecurity: No Food Insecurity (06/06/2024)   Hunger Vital Sign   .  Worried About Programme Researcher, Broadcasting/film/video in the Last Year: Never true   . Ran Out of Food in the Last Year: Never true  Transportation Needs: No Transportation Needs (06/06/2024)   PRAPARE - Transportation   . Lack of Transportation (Medical): No   . Lack of Transportation (Non-Medical): No  Physical Activity: Insufficiently Active (01/14/2024)   Exercise Vital Sign   . Days of Exercise per Week: 2 days   . Minutes of Exercise per Session: 60 min  Stress: Stress Concern Present (05/17/2024)   [employer] of Occupational Health - Occupational Stress Questionnaire   . Feeling of Stress: To some extent  Social Connections: Socially Isolated (01/14/2024)   Social Connection and Isolation Panel   . Frequency of Communication with Friends and Family: Never   . Frequency of Social Gatherings  with Friends and Family: Never   . Attends Religious Services: Never   . Active Member of Clubs or Organizations: No   . Attends Banker Meetings: Never   . Marital Status: Married  Housing: Low Risk  (05/23/2024)   Housing   . Within the past 12 months, have you ever stayed: outside, in a car, in a tent, in an overnight shelter, or temporarily in someone else's home (i.e. couch-surfing)?: No   . Are you worried about losing your housing?: No   Dallara, John James, MD 08/07/24 2113

## 2024-08-07 NOTE — ED Notes (Signed)
 ED Procedure Note  EKG Interpretation  Date/Time: 08/07/2024 9:10 PM  Performed by: [place] Norleen Agent, MD Authorized by: Arthur Norleen Agent, MD   ECG interpreted by ED Physician in the absence of a cardiologist: yes   Previous ECG:    Previous ECG:  Unavailable Interpretation:    Interpretation: normal   Rate:    ECG rate:  124   ECG rate assessment: tachycardic   Rhythm:    Rhythm: sinus rhythm and sinus tachycardia   Ectopy:    Ectopy: none   QRS:    QRS axis:  Normal   QRS intervals:  Normal   QRS conduction: normal   ST segments:    ST segments:  Normal T waves:    T waves: normal   Q waves:    Abnormal Q-waves: not present   Comments:     Sinus rhythm at 120 no acute ST-T wave changes normal intervals

## 2024-09-02 NOTE — ED Provider Notes (Signed)
 Emergency Department Provider Note    ED Clinical Impression   Final diagnoses:  Contusion of right knee, initial encounter (Primary)    ED Assessment/Plan    Condition: Stable Disposition: Discharge  This chart has been completed using Dragon Medical Dictation software, and while attempts have been made to ensure accuracy, certain words and phrases may not be transcribed as intended.   History   Chief Complaint  Patient presents with  . Knee Pain  . Fall   HPI  [name] is a 43 y.o. male  who presents today to the  emergency department complaining of an injury to the right knee after falling this morning.  Patient states he hit his knee.  Denies any other injuries.  He describes symptoms as mild.  Pain is worse with weightbearing.    Allergies: is allergic to metformin , penicillins, amoxicillin, ibuprofen , and nsaids (non-steroidal anti-inflammatory drug). Medications: has a current medication list which includes the following long-term medication(s): accu-chek guide me glucose mtr, albuterol , atorvastatin , freestyle libre 3 plus sensor, empagliflozin , famotidine , insulin  glargine, ipratropium-albuterol , lexapro, lurasidone , pregabalin, timolol, and trazodone . PMHx:  has a past medical history of ADHD, Bipolar disorder (CMS-HCC), Carpal tunnel syndrome on both sides, Colonic disease, Diabetes mellitus (CMS-HCC), Diverticulitis, GERD (gastroesophageal reflux disease), GI (gastrointestinal bleed), HLD (hyperlipidemia), Obesity, PTSD (post-traumatic stress disorder), and Substance abuse (CMS-HCC). PSHx:  has a past surgical history that includes Colon surgery; Colostomy takedown; and Hernia repair. SocHx:  reports that he has been smoking cigarettes. He has never been exposed to tobacco smoke. He has never used smokeless tobacco. He reports that he does not currently use alcohol. He reports  that he does not currently use drugs. Allergies, Medications, Medical, Surgical, and Social History were reviewed as documented above.   Social Drivers of Health with Concerns   Tobacco Use: High Risk (09/02/2024)   Patient History   . Smoking Tobacco Use: Every Day   . Smokeless Tobacco Use: Never   . Passive Exposure: Never  Physical Activity: Insufficiently Active (01/14/2024)   Exercise Vital Sign   . Days of Exercise per Week: 2 days   . Minutes of Exercise per Session: 60 min  Stress: Stress Concern Present (05/17/2024)   [employer] of Occupational Health - Occupational Stress Questionnaire   . Feeling of Stress: To some extent  Social Connections: Socially Isolated (01/14/2024)   Social Connection and Isolation Panel   . Frequency of Communication with Friends and Family: Never   . Frequency of Social Gatherings with Friends and Family: Never   . Attends Religious Services: Never   . Active Member of Clubs or Organizations: No   . Attends Banker Meetings: Never   . Marital Status: Married  Insurance Account Manager: Internet connectivity concern unknown (05/23/2024)   Insurance Account Manager   . Do you have access to internet services: Yes   . How do you connect to the internet: Not on file   . Is your internet connection strong enough for you to watch video on your device without major problems?: Not on file   . Do you have enough data to get through the month?: Not on file   . Does at least one of the devices have a camera that you can use for video chat?: Not on file  Review Of Systems  Review of Systems  Constitutional:  Negative for fever.  HENT:  Negative for congestion.   Respiratory:  Negative for chest tightness and shortness of breath.   Cardiovascular:  Negative for chest pain.  Gastrointestinal:  Negative for abdominal pain.  Musculoskeletal:        Right knee pain.  Skin:  Negative for color change.  Psychiatric/Behavioral:  Negative for  behavioral problems.   All other systems reviewed and are negative.   Physical Exam   BP 119/88   Pulse 91   Temp 37.2 C (98.9 F) (Oral)   Resp 18   Ht 180.3 cm (5' 11)   Wt 99.8 kg (220 lb)   SpO2 98%   BMI 30.68 kg/m   Physical Exam Vitals and nursing note reviewed.  Constitutional:      General: He is not in acute distress. HENT:     Head: Normocephalic.  Eyes:     Conjunctiva/sclera: Conjunctivae normal.  Cardiovascular:     Rate and Rhythm: Regular rhythm.     Pulses: Normal pulses.     Heart sounds: Normal heart sounds.  Pulmonary:     Effort: No respiratory distress.     Breath sounds: Normal breath sounds.  Abdominal:     General: There is no distension.  Musculoskeletal:        General: Tenderness present. No deformity.     Comments: Tenderness to the right medial knee.  No ligamentous laxity.  Full range of motion of the knee.  Normal distal neurovascular exam of the right lower extremity.  Skin:    General: Skin is warm.     Capillary Refill: Capillary refill takes 2 to 3 seconds.     Comments: Normal cap refill.  Neurological:     General: No focal deficit present.  Psychiatric:        Mood and Affect: Mood normal.     ED Course  Medical Decision Making Differential diagnosis includes contusion versus fracture.  7:16 AM Patient was doing well.  X-rays negative.  Patient stable for discharge.  I have reviewed my clinical findings and studies and my clinical impression with the patient. The patient has expressed understanding that at this time there is no evidence for a more malignant underlying process, but the patient also understands that early in the process of a condition such as this, an initial workup can be falsely reassuring. I have counseled the patient and discussed follow-up with the patient, stressing the importance of appropriate follow-up. I have also counseled the patient to return if worse or any concerns. Routine discharge  counseling was given to the patient and the patient understands that worsening, changing or persistent symptoms should prompt an immediate call or follow up with their primary physician or return to the emergency department for reevaluation. Patient has expressed understanding.     Problems Addressed: Contusion of right knee, initial encounter: acute illness or injury that poses a threat to life or bodily functions  Amount and/or Complexity of Data Reviewed Radiology: ordered and independent interpretation performed. Decision-making details documented in ED Course.    Details: Negative  Risk Prescription drug management.     Procedures   No results found for this visit on 09/02/24 (from the past 4464 hours).   ED Results No results found for any visits on 09/02/24. XR Knee 1 or 2 Views Right Result Date: 09/02/2024 Exam:  Right Knee  History:  Unwitnessed fall  Technique:  2 views  Comparison:  March 2023  Findings:  There is no acute fracture or malalignment. No bone destruction or erosion. The visualized joint spaces are congruent and there is no significant volume loss joint effusion. The periarticular soft tissues appear unremarkable.   No significant degenerative OA is demonstrated. Comparison to prior imaging demonstrates no significant interval change.    Negative right knee.    Signed (Electronic Signature): 09/02/2024 7:05 AM Signed By: Dow JAYSON Agee, MD   Medications Administered:  Medications  traMADol  (ULTRAM ) tablet 50 mg (50 mg Oral Given 09/02/24 9361)    Discharge Medications (Medications Prescribed during this  ED visit and Patient's Home Medications) :    Your Medication List     CHANGE how you take these medications    methocarbamol 500 MG tablet Commonly known as: ROBAXIN Take 1 tablet (500 mg total) by mouth. What changed: Another medication with the same name was added. Make sure you understand how and when to take each.   methocarbamol 500 MG  tablet Commonly known as: ROBAXIN Take 1 tablet (500 mg total) by mouth two (2) times a day for 10 days. What changed: You were already taking a medication with the same name, and this prescription was added. Make sure you understand how and when to take each.       ASK your doctor about these medications    ACCU-CHEK GUIDE ME GLUCOSE MTR Misc Generic drug: blood-glucose meter USE AS DIRECTED FOUR TIMES DAILY   ACCU-CHEK GUIDE TEST STRIPS Strp Generic drug: blood sugar diagnostic USE AS DIRECTED FOUR TIMES DAILY   albuterol  90 mcg/actuation inhaler Commonly known as: PROVENTIL  HFA;VENTOLIN  HFA INHALE 2 PUFFS EVERY 4 HOURS AS NEEDED   atorvastatin  80 MG tablet Commonly known as: LIPITOR Take 1 tablet (80 mg total) by mouth daily.   dextroamphetamine-amphetamine 20 mg tablet Commonly known as: ADDERALL Take 1 tablet (20 mg total) by mouth two (2) times a day. Ask about: Which instructions should I use?   EASY TOUCH 31 gauge x 3/16 (5 mm) Ndle Generic drug: Pen Needle 31 G x 5 MM USE AS DIRECTED TWICE DAILY   empagliflozin  10 mg tablet Commonly known as: JARDIANCE  Take 1 tablet (10 mg total) by mouth daily.   famotidine  20 MG tablet Commonly known as: PEPCID  TAKE 1 TABLET BY MOUTH DAILY AS NEEDED FOR heartburn OR INDIGESTION   FREESTYLE LIBRE 3 PLUS SENSOR Devi Generic drug: blood-glucose sensor 1 each by Miscellaneous route every fifteen (15) days.   hydrOXYzine 25 MG tablet Commonly known as: ATARAX TAKE 1 TABLET BY MOUTH THREE TIMES DAILY AS NEEDED FOR ANXIETY   insulin  glargine 100 unit/mL (3 mL) injection pen Commonly known as: BASAGLAR , LANTUS  Inject 0.45 mL (45 Units total) under the skin daily.   insulin  syringe-needle U-100 0.5 mL 29 gauge x 1/2 (12 mm) Syrg Use to inject insulin  4 times daily   ipratropium-albuterol  0.5-2.5 mg/3 mL nebulizer Commonly known as: DUO-NEB Inhale 3 mL by nebulization every six (6) hours as needed.   LEXAPRO 10 mg  tablet Generic drug: escitalopram oxalate Take 1 tablet (10 mg total) by mouth daily.   lurasidone  80 mg Tab   MOUNJARO 10 mg/0.5 mL Pnij Generic drug: tirzepatide Inject 10 mg under the skin every seven (7) days.   nebulizer and compressor Devi Use nebulizer with DuoNeb every 4-6 hours as needed   NURTEC ODT 75 mg Tbdl Generic drug: rimegepant   omeprazole  40 MG capsule Commonly known as: PriLOSEC Take 1  capsule (40 mg total) by mouth Two (2) times a day (30 minutes before a meal).   ondansetron  4 MG disintegrating tablet Commonly known as: ZOFRAN -ODT DISSOLVE 1 TABLET IN MOUTH EVERY 8 HOURS AS NEEDED FOR NAUSEA AND VOMITING   prazosin 2 MG capsule Commonly known as: MINIPRESS Take 1 capsule (2 mg total) by mouth nightly.   pregabalin 150 MG capsule Commonly known as: LYRICA Take 1 capsule (150 mg total) by mouth Three (3) times a day.   SURE COMFORT INSULIN  SYRINGE 1 mL 29 gauge x 1/2 (12 mm) Syrg Generic drug: insulin  syringe-needle U-100 USE TO INJECT INSULIN  FOUR TIMES DAILY AS DIRECTED   timolol 0.5 % ophthalmic solution Commonly known as: TIMOPTIC   traMADol  50 mg tablet Commonly known as: ULTRAM  1 tablet as needed Orally TID   traZODone  100 MG tablet Commonly known as: DESYREL  1 tablet at bedtime Orally Once a day   TRELEGY ELLIPTA 100-62.5-25 mcg inhaler Generic drug: fluticasone -umeclidin-vilanter Inhale 1 puff daily.          Cherie Ardeen Hanger, MD 09/02/24 [phone]

## 2024-09-03 ENCOUNTER — Emergency Department (HOSPITAL_COMMUNITY)
Admission: EM | Admit: 2024-09-03 | Discharge: 2024-09-03 | Disposition: A | Discharge status: Home / Self Care | Attending: Emergency Medicine | Admitting: Emergency Medicine

## 2024-09-03 ENCOUNTER — Emergency Department (HOSPITAL_COMMUNITY)

## 2024-09-03 DIAGNOSIS — S90111A Contusion of right great toe without damage to nail, initial encounter: Secondary | ICD-10-CM | POA: Insufficient documentation

## 2024-09-03 DIAGNOSIS — S8391XA Sprain of unspecified site of right knee, initial encounter: Secondary | ICD-10-CM | POA: Insufficient documentation

## 2024-09-03 DIAGNOSIS — S8991XA Unspecified injury of right lower leg, initial encounter: Secondary | ICD-10-CM | POA: Diagnosis present

## 2024-09-03 DIAGNOSIS — Z79899 Other long term (current) drug therapy: Secondary | ICD-10-CM | POA: Insufficient documentation

## 2024-09-03 DIAGNOSIS — W231XXA Caught, crushed, jammed, or pinched between stationary objects, initial encounter: Secondary | ICD-10-CM | POA: Insufficient documentation

## 2024-09-03 MED ORDER — HYDROMORPHONE HCL 1 MG/ML IJ SOLN
1.0000 mg | Freq: Once | INTRAMUSCULAR | Status: AC
  Administered 2024-09-03: 1 mg via INTRAMUSCULAR
  Filled 2024-09-03: qty 1

## 2024-09-03 NOTE — ED Triage Notes (Signed)
 Pt comes in after a fall from 2 days ago. Pt went to [facility] yesterday. X-ray was negative but now pt is unable to bear weight. Pt states the is more swelling. Pt tried tramadol , ice and muscle relaxer, all did not work. A&Ox4.

## 2024-09-03 NOTE — ED Notes (Signed)
 Ace wrap applied to pt's right knee, and post op shoe applied to pt's right foot. Tolerated well. Pt also educated on how to use crutches, pt verbalized understanding.

## 2024-09-03 NOTE — ED Provider Notes (Signed)
 [name]   CSN: [id number] Arrival date & time: 09/03/24  1134     Patient presents with: Fall (Right knee and big toe pain)   [name] is a 43 y.o. male.   Patient complains of pain to his right knee and his right first toe after falling.  Patient reports that he fell and twisted his knee and jammed his toe.  Patient states that he has frequent falls because of his neuropathy.  Patient went to the [facility] emergency department yesterday and had x-rays and was told that nothing was broken.  Patient comes in because he is continuing to have pain.  He is taking tramadol  and using ice.  Patient reports he is unable to tolerate weightbearing today.  Patient denies any other injuries he is not having any headache he is not have any neck pain patient denies any chest or abdominal pain.  Patient has a history of chronic pain and is in pain management.   Fall       Prior to Admission medications   Medication Sig Start Date End Date Taking? Authorizing Provider  albuterol  (VENTOLIN  HFA) 108 (90 Base) MCG/ACT inhaler Inhale 2 puffs into the lungs every 6 (six) hours as needed for wheezing or shortness of breath.    [provider]  atorvastatin  (LIPITOR) 80 MG tablet Take 1 tablet (80 mg total) by mouth daily. 05/05/24   Court Dorn PARAS, MD  Blood Glucose Monitoring Suppl (ONE TOUCH ULTRA 2) w/Device KIT Measure blood sugar daily and with meals for insulin  10/22/21   Therisa Benton PARAS, NP  escitalopram (LEXAPRO) 10 MG tablet Take 10 mg by mouth daily as needed (anxiety).    [provider]  famotidine  (PEPCID ) 20 MG tablet TAKE 1 TABLET BY MOUTH DAILY AS NEEDED FOR heartburn OR INDIGESTION 06/13/24   Ezzard Sonny RAMAN, PA-C  fluticasone  (FLONASE ) 50 MCG/ACT nasal spray Place 2 sprays into both nostrils daily. 10/15/23   Suellen Cantor A, PA-C  glucose blood (ONETOUCH ULTRA) test strip Use as instructed to  monitor glucose 4 times daily 10/22/21   Therisa Benton PARAS, NP  HUMALOG  100 UNIT/ML injection inject 5-11 units into THE SKIN THREE TIMES DAILY with meals 03/11/23   Therisa Benton PARAS, NP  Lancets [facility] ULTRASOFT) lancets Use as instructed to monitor glucose 4 times daily 10/22/21   Therisa Benton PARAS, NP  LANTUS  100 UNIT/ML injection INJECT 40 units into THE SKIN AT BEDTIME 03/11/23   Therisa Benton PARAS, NP  LANTUS  SOLOSTAR 100 UNIT/ML Solostar Pen Inject 40 Units into the skin at bedtime.    [provider]  linaclotide LARUE) 72 MCG capsule Take by mouth. 07/31/23   [provider]  lurasidone  (LATUDA ) 80 MG TABS tablet Take 80 mg by mouth daily with breakfast.    [provider]  methocarbamol (ROBAXIN) 500 MG tablet Take 250-500 mg by mouth 2 (two) times daily as needed for muscle spasms. 11/21/22   [provider]  metoprolol  tartrate (LOPRESSOR ) 100 MG tablet TAKE 2 HOURS PRIOR TO CT SCAN 04/26/24   Court Dorn PARAS, MD  NURTEC 75 MG TBDP Take 75 mg by mouth as needed. 03/28/24   [provider]  omeprazole  (PRILOSEC) 40 MG capsule Take 40 mg by mouth at bedtime.    [provider]  timolol (TIMOPTIC) 0.5 % ophthalmic solution Place 1 drop into both eyes daily. 04/24/24   [provider]  traMADol  (  ULTRAM ) 50 MG tablet Take 50 mg by mouth 3 (three) times daily as needed for moderate pain. 12/30/21   [provider]  traZODone  (DESYREL ) 100 MG tablet Take 100-200 mg by mouth at bedtime. 12/05/19   [provider]  VYVANSE  20 MG capsule Take 20 mg by mouth daily with breakfast. Take with 50 mg for a total of 70 mg 06/28/18   [provider]  VYVANSE  50 MG capsule Take 50 mg by mouth See admin instructions. Take with 20 mg for a total of 70 mg in the morning 06/28/18   [provider]    Allergies: Metformin  and related, Penicillins, and Nsaids    Review of Systems  Musculoskeletal:  Positive for joint  swelling.  All other systems reviewed and are negative.   Updated Vital Signs BP (!) 128/92 (BP Location: Right Arm)   Pulse (!) 110   Temp 98.1 F (36.7 C)   Resp 16   Ht 5' 7 (1.702 m)   Wt 99.8 kg   SpO2 98%   BMI 34.46 kg/m   Physical Exam Vitals reviewed.  Constitutional:      Appearance: Normal appearance.  Cardiovascular:     Rate and Rhythm: Normal rate.  Pulmonary:     Effort: Pulmonary effort is normal.  Musculoskeletal:        General: Swelling and tenderness present.     Comments: Right foot tender right 1st toe, pain with movement,  nv and ns intact.  Right knee tender medial knee, from nv and ns intact   Skin:    General: Skin is warm.  Neurological:     General: No focal deficit present.     Mental Status: He is alert.     (all labs ordered are listed, but only abnormal results are displayed) Labs Reviewed - No data to display  EKG: None  Radiology: DG Foot Complete Right Result Date: 09/03/2024 CLINICAL DATA:  Fall 2 days ago with right foot pain. EXAM: RIGHT FOOT COMPLETE - 3+ VIEW COMPARISON:  None Available. FINDINGS: There is no evidence of fracture or dislocation. There is no evidence of arthropathy or other focal bone abnormality. Soft tissues are unremarkable. IMPRESSION: Negative. Electronically Signed   By: Toribio Agreste M.D.   On: 09/03/2024 12:19   DG Knee Complete 4 Views Right Result Date: 09/03/2024 CLINICAL DATA:  Fall 2 days ago with right knee pain. EXAM: RIGHT KNEE - COMPLETE 4+ VIEW COMPARISON:  None Available. FINDINGS: Mild osteoarthritic change. No acute fracture or dislocation. No significant joint effusion. IMPRESSION: 1. No acute findings. 2. Mild osteoarthritic change. Electronically Signed   By: Toribio Agreste M.D.   On: 09/03/2024 12:18     Procedures   Medications Ordered in the ED  HYDROmorphone  (DILAUDID ) injection 1 mg (1 mg Intramuscular Given 09/03/24 1354)                                    Medical  Decision Making Patient reports he fell and injured his right first toe and his knee yesterday patient reports he is having significant pain unrelieved by his home pain medicine  Amount and/or Complexity of Data Reviewed Radiology: ordered and independent interpretation performed. Decision-making details documented in ED Course.    Details: X-ray right knee and right foot show no acute fractures  Risk Prescription drug management. Parenteral controlled substances. Risk Details: Patient is given an injection  of pain medicine he is advised to continue his home medications and to follow-up with his physician he is given the phone number for an orthopedist to follow-up if he continues having pain.        Final diagnoses:  Sprain of right knee, unspecified ligament, initial encounter  Contusion of right great toe without damage to nail, initial encounter    ED Discharge Orders     None      Patient is placed in a postop shoe and an Ace wrap.  Patient is given crutches.  He is given an injection for pain here he is advised he needs to discuss his pain management with his primary care physician.    Flint Sonny POUR, PA-C 09/03/24 1423    Suzette Pac, MD 09/06/24 1036

## 2024-10-08 ENCOUNTER — Emergency Department (HOSPITAL_COMMUNITY)
Admission: EM | Admit: 2024-10-08 | Discharge: 2024-10-08 | Disposition: A | Discharge status: Home / Self Care | Attending: Emergency Medicine | Admitting: Emergency Medicine

## 2024-10-08 ENCOUNTER — Other Ambulatory Visit: Payer: Self-pay

## 2024-10-08 ENCOUNTER — Encounter (HOSPITAL_COMMUNITY): Payer: Self-pay

## 2024-10-08 DIAGNOSIS — M25561 Pain in right knee: Secondary | ICD-10-CM | POA: Insufficient documentation

## 2024-10-08 DIAGNOSIS — W19XXXA Unspecified fall, initial encounter: Secondary | ICD-10-CM | POA: Diagnosis not present

## 2024-10-08 LAB — URINALYSIS, ROUTINE W REFLEX MICROSCOPIC
Bacteria, UA: NONE SEEN
Bilirubin Urine: NEGATIVE
Glucose, UA: >=500 mg/dL — AB
Hgb urine dipstick: NEGATIVE
Ketones, ur: NEGATIVE mg/dL
Leukocytes,Ua: NEGATIVE
Nitrite: NEGATIVE
Protein, ur: NEGATIVE mg/dL
Specific Gravity, Urine: 1.039 — ABNORMAL HIGH (ref 1.005–1.030)
pH: 6 (ref 5.0–8.0)

## 2024-10-08 MED ORDER — HYDROMORPHONE HCL 1 MG/ML IJ SOLN
1.0000 mg | Freq: Once | INTRAMUSCULAR | Status: AC
  Administered 2024-10-08: 1 mg via INTRAMUSCULAR
  Filled 2024-10-08: qty 1

## 2024-10-08 NOTE — ED Provider Notes (Signed)
 " [facility] AT [facility] Provider Note   CSN: [id number] Arrival date & time: 10/08/24  1142     Patient presents with: Knee Pain   [name] is a 43 y.o. male.    Knee Pain      [name] is a 43 y.o. male who presents to the Emergency Department complaining of persistent right knee pain.  He fell on his right knee 1 month ago.  He is continuing to have persistent pain despite having follow-up appointment with orthopedics.  He has been seen here several times for this.  Denies any new injuries or swelling or numbness of the extremity.  He is here requesting help with his pain  Prior to Admission medications  Medication Sig Start Date End Date Taking? Authorizing Provider  albuterol  (VENTOLIN  HFA) 108 (90 Base) MCG/ACT inhaler Inhale 2 puffs into the lungs every 6 (six) hours as needed for wheezing or shortness of breath.    [provider]  atorvastatin  (LIPITOR) 80 MG tablet Take 1 tablet (80 mg total) by mouth daily. 05/05/24   Court Dorn PARAS, MD  Blood Glucose Monitoring Suppl (ONE TOUCH ULTRA 2) w/Device KIT Measure blood sugar daily and with meals for insulin  10/22/21   Therisa Benton PARAS, NP  escitalopram (LEXAPRO) 10 MG tablet Take 10 mg by mouth daily as needed (anxiety).    [provider]  famotidine  (PEPCID ) 20 MG tablet TAKE 1 TABLET BY MOUTH DAILY AS NEEDED FOR heartburn OR INDIGESTION 06/13/24   Ezzard Sonny RAMAN, PA-C  fluticasone  (FLONASE ) 50 MCG/ACT nasal spray Place 2 sprays into both nostrils daily. 10/15/23   Suellen Cantor A, PA-C  glucose blood (ONETOUCH ULTRA) test strip Use as instructed to monitor glucose 4 times daily 10/22/21   Therisa Benton PARAS, NP  HUMALOG  100 UNIT/ML injection inject 5-11 units into THE SKIN THREE TIMES DAILY with meals 03/11/23   Therisa Benton PARAS, NP  Lancets [facility] ULTRASOFT) lancets Use as instructed to monitor glucose 4 times daily 10/22/21   Therisa Benton PARAS, NP  LANTUS  100  UNIT/ML injection INJECT 40 units into THE SKIN AT BEDTIME 03/11/23   Therisa Benton PARAS, NP  LANTUS  SOLOSTAR 100 UNIT/ML Solostar Pen Inject 40 Units into the skin at bedtime.    [provider]  linaclotide LARUE) 72 MCG capsule Take by mouth. 07/31/23   [provider]  lurasidone  (LATUDA ) 80 MG TABS tablet Take 80 mg by mouth daily with breakfast.    [provider]  methocarbamol (ROBAXIN) 500 MG tablet Take 250-500 mg by mouth 2 (two) times daily as needed for muscle spasms. 11/21/22   [provider]  metoprolol  tartrate (LOPRESSOR ) 100 MG tablet TAKE 2 HOURS PRIOR TO CT SCAN 04/26/24   Court Dorn PARAS, MD  NURTEC 75 MG TBDP Take 75 mg by mouth as needed. 03/28/24   [provider]  omeprazole  (PRILOSEC) 40 MG capsule Take 40 mg by mouth at bedtime.    [provider]  timolol (TIMOPTIC) 0.5 % ophthalmic solution Place 1 drop into both eyes daily. 04/24/24   [provider]  traMADol  (ULTRAM ) 50 MG tablet Take 50 mg by mouth 3 (three) times daily as needed for moderate pain. 12/30/21   [provider]  traZODone  (DESYREL ) 100 MG tablet Take 100-200 mg by mouth at bedtime. 12/05/19   [provider]  VYVANSE  20 MG capsule Take 20 mg by mouth daily with breakfast. Take with 50  mg for a total of 70 mg 06/28/18   [provider]  VYVANSE  50 MG capsule Take 50 mg by mouth See admin instructions. Take with 20 mg for a total of 70 mg in the morning 06/28/18   [provider]    Allergies: Metformin  and related, Penicillins, and Nsaids    Review of Systems  Musculoskeletal:  Positive for arthralgias (Knee pain).  All other systems reviewed and are negative.   Updated Vital Signs BP 132/76 (BP Location: Left Arm)   Pulse (!) 115   Temp 98.1 F (36.7 C) (Oral)   Resp 18   Wt 96.6 kg   SpO2 98%   BMI 33.36 kg/m   Physical Exam Vitals and nursing note reviewed.  Constitutional:      General: He is  not in acute distress.    Appearance: Normal appearance.  Cardiovascular:     Rate and Rhythm: Normal rate and regular rhythm.     Pulses: Normal pulses.  Pulmonary:     Effort: Pulmonary effort is normal.  Musculoskeletal:        General: Tenderness present. No swelling or deformity.     Right lower leg: No edema.     Left lower leg: No edema.     Comments: Pain on range of motion of the right knee.  No palpable effusion, no erythema or edema.  Right calf nontender  Skin:    General: Skin is warm.  Neurological:     Mental Status: He is alert.     (all labs ordered are listed, but only abnormal results are displayed) Labs Reviewed  URINALYSIS, ROUTINE W REFLEX MICROSCOPIC - Abnormal; Notable for the following components:      Result Value   Specific Gravity, Urine 1.039 (*)    Glucose, UA >=500 (*)    All other components within normal limits    EKG: None  Radiology: No results found.   Procedures   Medications Ordered in the ED  HYDROmorphone  (DILAUDID ) injection 1 mg (has no administration in time range)                                    Medical Decision Making   Patient here requesting assistance with pain control.  He has had ongoing right knee pain after a fall.  States he feels that his knee will give away at times.  Denies any recent injuries.  On my exam, I do not appreciate any obvious instabilities erythema or edema.  No concerning symptoms for septic joint.  I do not appreciate an obvious effusion.  Patient has previous x-rays of the knee.  No recent injury to suggest need for repeat imaging today.  Pain will be addressed here and I will provide knee brace to use only as needed for support when standing.  Amount and/or Complexity of Data Reviewed Labs: ordered.  Risk Prescription drug management.        Final diagnoses:  Right knee pain, unspecified chronicity    ED Discharge Orders     None          Herlinda Milling,  DEVONNA 10/10/24 1807    Dean Clarity, MD 10/17/24 1538  "

## 2024-10-08 NOTE — Discharge Instructions (Signed)
 Wear the brace as needed for support when standing.  You may contact the orthopedic provider listed to arrange a f/u appt.

## 2024-10-08 NOTE — ED Triage Notes (Addendum)
 Pt reports he fell on his right knee a month ago and this is his fourth visit to be seen for it.  Pt reports he has had an MRI but the orthopedic is not doing anything about, says it will heal on its own.

## 2024-10-19 ENCOUNTER — Ambulatory Visit: Admitting: Orthopedic Surgery

## 2024-10-19 ENCOUNTER — Encounter: Payer: Self-pay | Admitting: Orthopedic Surgery

## 2024-10-19 DIAGNOSIS — S83411A Sprain of medial collateral ligament of right knee, initial encounter: Secondary | ICD-10-CM | POA: Diagnosis not present

## 2024-10-19 NOTE — Progress Notes (Unsigned)
 "  Office Visit Note   Patient: [name]           Date of Birth: 1980-11-30           MRN: [id number] Visit Date: 10/19/2024 Requested by: Marlee Lynwood NOVAK, MD [phone] [address] [place],  [place] [address] PCP: Marlee Lynwood NOVAK, MD  Subjective: Chief Complaint  Patient presents with   Right Knee - Injury, Pain    Pain due to injury and has been in pain since, pain varies in intensity and keeps pt up at night, MRI was completed 09/03/2024    HPI: [name] is a 43 y.o. male who presents to the office reporting right knee pain.  Patient states that he injured his knee about 6 weeks ago.  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  Has been hard for him to walk.  He reports exclusively medial sided pain.  He does do wrestling referee work.  Physical therapy was making his knee worse..                ROS: All systems reviewed are negative as they relate to the chief complaint within the history of present illness.  Patient denies fevers or chills.  Assessment & Plan: Visit Diagnoses:  1. Sprain of medial collateral ligament of right knee, initial encounter     Plan: Impression is MCL sprain with good range of motion and no effusion and no lateral joint line tenderness in the right knee.  This should be a self-limited injury.  No indication for intervention at this time.  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  I think that would be the okay as long as he can get up and down stairs without pain and move side-to-side without pain.  Will follow-up with us  as needed.  Follow-Up Instructions: No follow-ups on file.   Orders:  No orders of the defined types were placed in this encounter.  No orders of the defined types were placed in this encounter.     Procedures: No procedures performed   Clinical Data: No additional findings.  Objective: Vital Signs: There were  no vitals taken for this visit.  Physical Exam:  Constitutional: Patient appears well-developed HEENT:  Head: Normocephalic Eyes:EOM are normal Neck: Normal range of motion Cardiovascular: Normal rate Pulmonary/chest: Effort normal Neurologic: Patient is alert Skin: Skin is warm Psychiatric: Patient has normal mood and affect  Ortho Exam: Ortho exam demonstrates full range of motion of the right knee with no effusion.  Does have some tenderness over the MCL.  No lateral joint line tenderness negative McMurray compression testing for medial lateral compartment pathology.  Extensor mechanism intact and nontender.  Collateral and cruciate ligaments are stable.  No groin pain with internal/external rotation of the leg.  Specialty Comments:  No specialty comments available.  Imaging: No results found.   PMFS History: Patient Active Problem List   Diagnosis Date Noted   Bilateral lower extremity pain 04/26/2024   Paresthesia 11/30/2023   DM type 2 with diabetic peripheral neuropathy (HCC) 11/30/2023   Neuropathic pain 11/30/2023   Constipation 08/04/2023   Chronic abdominal pain 03/02/2023   COVID-19 06/04/2021   Abdominal wall abscess 03/06/2021   Insomnia 02/06/2021   Incisional hernia 01/04/2021   Ventral hernia 11/21/2020   Anxiety    Shoulder injury, subsequent encounter 06/29/2020   High priority for  severe acute respiratory syndrome coronavirus 2 (SARS-CoV-2) vaccination 03/29/2020   Type 2 diabetes mellitus with hyperglycemia (HCC) 03/01/2020   Atypical chest pain 11/22/2018   Abdominal pain 11/22/2018   Memory difficulty 10/04/2018   Erectile dysfunction 01/29/2018   Neck pain 11/12/2016   Back pain 10/25/2016   Allergic rhinitis 04/28/2016   GERD (gastroesophageal reflux disease) 03/21/2013   Elevated BP 04/25/2012   Narcotic abuse (HCC) 06/05/2011   OBESITY 10/17/2010   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   SLEEP APNEA 01/24/2009   BIPOLAR DISORDER UNSPECIFIED  05/05/2008   Pure hypercholesterolemia 03/30/2008   ABUSE, ALCOHOL, UNSPECIFIED 04/26/2007   TOBACCO ABUSE 12/25/2006   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HCC)    Bipolar disorder (HCC)    On disability   Depression    Diabetes mellitus without complication (HCC)    per patient : under control with diet, does not monitor cbg at home    GERD (gastroesophageal reflux disease)    Headache(784.0)    daily headaches   Hyperlipidemia    Neuromuscular disorder (HCC)    Beckers muscular dystrophy   Obesity    PTSD (post-traumatic stress disorder)     Family History  Problem Relation Age of Onset   COPD Mother    Depression Mother    Diabetes Mother    Hyperlipidemia Mother    Asthma Father    Arthritis Father    Diabetes Father    Heart disease Father    Hyperlipidemia Father    Hypertension Father    Hypertension Brother    Colon cancer Neg Hx     Past Surgical History:  Procedure Laterality Date   APPLICATION OF WOUND VAC N/A 11/23/2018   Procedure: Application Of Wound Vac;  Surgeon: Kinsinger, Herlene Righter, MD;  Location: [facility];  Service: General;  Laterality: N/A;   BIOPSY  12/01/2022   Procedure: BIOPSY;  Surgeon: Cindie Carlin POUR, DO;  Location: [facility];  Service: Endoscopy;;   COLECTOMY N/A 11/23/2018   Procedure: TOTAL COLECTOMY;  Surgeon: Stevie Herlene Righter, MD;  Location: [facility];  Service: General;  Laterality: N/A;   COLONOSCOPY WITH PROPOFOL  N/A 12/01/2022   Procedure: COLONOSCOPY WITH PROPOFOL ;  Surgeon: Cindie Carlin POUR, DO;  Location: [facility];  Service: Endoscopy;  Laterality: N/A;  10:45 am   ILEOSTOMY N/A 11/23/2018   Procedure: ILEOSTOMY;  Surgeon: Kinsinger, Herlene Righter, MD;  Location: [facility];  Service: General;  Laterality: N/A;   ILEOSTOMY CLOSURE N/A 05/19/2019   Procedure: ILEOSTOMY REVERSAL WITH ILEOCOLIC AND COLORECTAL ANASTOMOSIS;  Surgeon: Stevie Herlene Righter, MD;  Location: [facility];   Service: General;  Laterality: N/A;   INCISIONAL HERNIA REPAIR N/A 01/04/2021   Procedure: LAPAROSCOPIC INCISIONAL HERNIA REPAIR WITH MESH; LYSIS OF ADHESIONS;  Surgeon: Stevie Herlene Righter, MD;  Location: [facility];  Service: General;  Laterality: N/A;   IR GUIDED DRAIN W CATHETER PLACEMENT  02/14/2021   IR RADIOLOGIST EVAL & MGMT  02/19/2021   IR RADIOLOGIST EVAL & MGMT  03/21/2021   IR RADIOLOGIST EVAL & MGMT  04/03/2021   IR RADIOLOGIST EVAL & MGMT  04/17/2021   NM MYOCAR PERF WALL MOTION  01/22/2011   protocol: Persantine, moderate reversible inferior defect post stress EF 48%, high risk scan   POLYPECTOMY  12/01/2022   Procedure: POLYPECTOMY;  Surgeon: Cindie Carlin POUR, DO;  Location: [facility];  Service: Endoscopy;;   TONSILLECTOMY  TRANSTHORACIC ECHOCARDIOGRAM  07/05/2004   EF=>55% normal study    Social History   Occupational History    Employer: UNEMPLOYED  Tobacco Use   Smoking status: Every Day    Current packs/day: 1.50    Average packs/day: 1.5 packs/day for 21.0 years (31.5 ttl pk-yrs)    Types: Cigarettes   Smokeless tobacco: Never  Vaping Use   Vaping status: Former   Devices: Increased nicotine cravings  Substance and Sexual Activity   Alcohol use: No    Alcohol/week: 0.0 standard drinks of alcohol    Comment: quit 2013   Drug use: Not Currently    Types: Marijuana    Comment: 2009 last use   Sexual activity: Yes    Birth control/protection: None        "

## 2024-10-25 ENCOUNTER — Ambulatory Visit: Admitting: Orthopedic Surgery

## 2024-12-07 ENCOUNTER — Ambulatory Visit: Admitting: Orthopedic Surgery
